# Patient Record
Sex: Male | Born: 1935 | Race: White | Hispanic: No | Marital: Married | Smoking: Never smoker
Health system: Southern US, Community
[De-identification: ages and names within clinical notes are randomized; demographics above are authoritative.]

## PROBLEM LIST (undated history)

## (undated) DIAGNOSIS — D689 Coagulation defect, unspecified: Secondary | ICD-10-CM

## (undated) DIAGNOSIS — K649 Unspecified hemorrhoids: Secondary | ICD-10-CM

## (undated) DIAGNOSIS — Z86718 Personal history of other venous thrombosis and embolism: Secondary | ICD-10-CM

## (undated) DIAGNOSIS — M1990 Unspecified osteoarthritis, unspecified site: Secondary | ICD-10-CM

## (undated) DIAGNOSIS — R011 Cardiac murmur, unspecified: Secondary | ICD-10-CM

## (undated) DIAGNOSIS — T7840XA Allergy, unspecified, initial encounter: Secondary | ICD-10-CM

## (undated) DIAGNOSIS — D6851 Activated protein C resistance: Secondary | ICD-10-CM

## (undated) DIAGNOSIS — E785 Hyperlipidemia, unspecified: Secondary | ICD-10-CM

## (undated) DIAGNOSIS — K635 Polyp of colon: Secondary | ICD-10-CM

## (undated) DIAGNOSIS — K5790 Diverticulosis of intestine, part unspecified, without perforation or abscess without bleeding: Secondary | ICD-10-CM

## (undated) HISTORY — PX: VARICOSE VEIN SURGERY: SHX832

## (undated) HISTORY — DX: Unspecified hemorrhoids: K64.9

## (undated) HISTORY — DX: Polyp of colon: K63.5

## (undated) HISTORY — DX: Cardiac murmur, unspecified: R01.1

## (undated) HISTORY — DX: Unspecified osteoarthritis, unspecified site: M19.90

## (undated) HISTORY — DX: Allergy, unspecified, initial encounter: T78.40XA

## (undated) HISTORY — PX: TONSILLECTOMY: SUR1361

## (undated) HISTORY — DX: Activated protein C resistance: D68.51

## (undated) HISTORY — DX: Diverticulosis of intestine, part unspecified, without perforation or abscess without bleeding: K57.90

## (undated) HISTORY — DX: Coagulation defect, unspecified: D68.9

## (undated) HISTORY — PX: INGUINAL HERNIA REPAIR: SUR1180

## (undated) HISTORY — DX: Personal history of other venous thrombosis and embolism: Z86.718

## (undated) HISTORY — DX: Hyperlipidemia, unspecified: E78.5

## (undated) HISTORY — PX: APPENDECTOMY: SHX54

## (undated) HISTORY — PX: VASECTOMY: SHX75

---

## 2001-11-16 ENCOUNTER — Encounter: Payer: Self-pay | Admitting: Internal Medicine

## 2001-11-27 ENCOUNTER — Encounter (INDEPENDENT_AMBULATORY_CARE_PROVIDER_SITE_OTHER): Payer: Self-pay | Admitting: *Deleted

## 2001-12-30 ENCOUNTER — Ambulatory Visit (HOSPITAL_COMMUNITY): Admission: RE | Admit: 2001-12-30 | Discharge: 2001-12-30 | Payer: Self-pay | Admitting: Ophthalmology

## 2004-07-05 ENCOUNTER — Ambulatory Visit: Payer: Self-pay | Admitting: Family Medicine

## 2004-09-04 ENCOUNTER — Ambulatory Visit: Payer: Self-pay | Admitting: Internal Medicine

## 2004-11-10 ENCOUNTER — Ambulatory Visit: Payer: Self-pay | Admitting: Family Medicine

## 2004-11-13 ENCOUNTER — Ambulatory Visit: Payer: Self-pay | Admitting: Gastroenterology

## 2004-12-05 ENCOUNTER — Ambulatory Visit: Payer: Self-pay | Admitting: Gastroenterology

## 2004-12-05 ENCOUNTER — Encounter (INDEPENDENT_AMBULATORY_CARE_PROVIDER_SITE_OTHER): Payer: Self-pay | Admitting: Specialist

## 2004-12-05 ENCOUNTER — Encounter (INDEPENDENT_AMBULATORY_CARE_PROVIDER_SITE_OTHER): Payer: Self-pay | Admitting: *Deleted

## 2004-12-05 ENCOUNTER — Encounter: Payer: Self-pay | Admitting: Internal Medicine

## 2005-01-12 ENCOUNTER — Ambulatory Visit: Payer: Self-pay | Admitting: Family Medicine

## 2005-09-03 ENCOUNTER — Ambulatory Visit: Payer: Self-pay | Admitting: Family Medicine

## 2005-09-04 ENCOUNTER — Ambulatory Visit: Payer: Self-pay | Admitting: Family Medicine

## 2005-11-27 ENCOUNTER — Ambulatory Visit: Payer: Self-pay | Admitting: Family Medicine

## 2006-09-04 ENCOUNTER — Ambulatory Visit: Payer: Self-pay | Admitting: Family Medicine

## 2006-09-04 DIAGNOSIS — R1310 Dysphagia, unspecified: Secondary | ICD-10-CM

## 2006-09-04 DIAGNOSIS — E785 Hyperlipidemia, unspecified: Secondary | ICD-10-CM | POA: Insufficient documentation

## 2006-09-04 DIAGNOSIS — M1990 Unspecified osteoarthritis, unspecified site: Secondary | ICD-10-CM | POA: Insufficient documentation

## 2006-09-04 HISTORY — DX: Dysphagia, unspecified: R13.10

## 2006-09-06 ENCOUNTER — Encounter (INDEPENDENT_AMBULATORY_CARE_PROVIDER_SITE_OTHER): Payer: Self-pay | Admitting: *Deleted

## 2006-09-12 ENCOUNTER — Encounter: Payer: Self-pay | Admitting: Family Medicine

## 2006-09-12 ENCOUNTER — Ambulatory Visit: Payer: Self-pay

## 2006-10-03 ENCOUNTER — Ambulatory Visit: Payer: Self-pay | Admitting: Cardiology

## 2006-12-11 ENCOUNTER — Encounter: Payer: Self-pay | Admitting: Family Medicine

## 2006-12-20 ENCOUNTER — Ambulatory Visit: Payer: Self-pay | Admitting: Family Medicine

## 2006-12-24 ENCOUNTER — Encounter: Payer: Self-pay | Admitting: Family Medicine

## 2007-02-09 HISTORY — PX: CATARACT EXTRACTION: SUR2

## 2007-04-02 ENCOUNTER — Ambulatory Visit: Payer: Self-pay | Admitting: Family Medicine

## 2007-04-14 LAB — CONVERTED CEMR LAB
Albumin: 3.9 g/dL (ref 3.5–5.2)
Bilirubin, Direct: 0.1 mg/dL (ref 0.0–0.3)
HDL: 30.4 mg/dL — ABNORMAL LOW (ref >39.0)
LDL Cholesterol: 56 mg/dL (ref 0–99)
Total CHOL/HDL Ratio: 4
Triglycerides: 175 mg/dL — ABNORMAL HIGH (ref 0–149)
VLDL: 35 mg/dL (ref 0–40)

## 2007-04-24 ENCOUNTER — Telehealth (INDEPENDENT_AMBULATORY_CARE_PROVIDER_SITE_OTHER): Payer: Self-pay | Admitting: *Deleted

## 2007-08-11 ENCOUNTER — Encounter (INDEPENDENT_AMBULATORY_CARE_PROVIDER_SITE_OTHER): Payer: Self-pay | Admitting: *Deleted

## 2007-08-11 ENCOUNTER — Ambulatory Visit: Payer: Self-pay | Admitting: Family Medicine

## 2007-08-11 DIAGNOSIS — D239 Other benign neoplasm of skin, unspecified: Secondary | ICD-10-CM

## 2007-08-11 HISTORY — DX: Other benign neoplasm of skin, unspecified: D23.9

## 2007-08-21 ENCOUNTER — Encounter: Payer: Self-pay | Admitting: Family Medicine

## 2007-08-21 ENCOUNTER — Encounter (INDEPENDENT_AMBULATORY_CARE_PROVIDER_SITE_OTHER): Payer: Self-pay | Admitting: *Deleted

## 2007-08-25 ENCOUNTER — Ambulatory Visit: Payer: Self-pay | Admitting: Family Medicine

## 2007-08-25 ENCOUNTER — Encounter (INDEPENDENT_AMBULATORY_CARE_PROVIDER_SITE_OTHER): Payer: Self-pay | Admitting: *Deleted

## 2007-08-25 LAB — CONVERTED CEMR LAB: OCCULT 3: NEGATIVE

## 2007-08-28 ENCOUNTER — Ambulatory Visit: Payer: Self-pay | Admitting: Family Medicine

## 2007-08-28 DIAGNOSIS — R413 Other amnesia: Secondary | ICD-10-CM | POA: Insufficient documentation

## 2007-09-12 ENCOUNTER — Ambulatory Visit: Payer: Self-pay | Admitting: Cardiology

## 2007-09-29 ENCOUNTER — Ambulatory Visit (HOSPITAL_COMMUNITY): Admission: RE | Admit: 2007-09-29 | Discharge: 2007-09-29 | Payer: Self-pay | Admitting: Cardiology

## 2007-11-11 ENCOUNTER — Ambulatory Visit: Admission: RE | Admit: 2007-11-11 | Discharge: 2007-11-11 | Payer: Self-pay | Admitting: Family Medicine

## 2007-11-11 ENCOUNTER — Ambulatory Visit: Payer: Self-pay | Admitting: Vascular Surgery

## 2007-11-11 ENCOUNTER — Ambulatory Visit: Payer: Self-pay | Admitting: Family Medicine

## 2007-11-11 ENCOUNTER — Ambulatory Visit: Payer: Self-pay | Admitting: Cardiovascular Disease

## 2007-11-11 ENCOUNTER — Telehealth (INDEPENDENT_AMBULATORY_CARE_PROVIDER_SITE_OTHER): Payer: Self-pay | Admitting: *Deleted

## 2007-11-11 ENCOUNTER — Encounter: Payer: Self-pay | Admitting: Family Medicine

## 2007-11-11 DIAGNOSIS — M79609 Pain in unspecified limb: Secondary | ICD-10-CM | POA: Insufficient documentation

## 2007-11-11 HISTORY — DX: Pain in unspecified limb: M79.609

## 2007-11-13 ENCOUNTER — Ambulatory Visit: Payer: Self-pay | Admitting: Cardiology

## 2007-11-13 LAB — CONVERTED CEMR LAB: Prothrombin Time: 12.8 s (ref 10.9–13.3)

## 2007-11-17 ENCOUNTER — Ambulatory Visit: Payer: Self-pay | Admitting: Cardiology

## 2007-11-17 LAB — CONVERTED CEMR LAB: INR: 2.7 — ABNORMAL HIGH (ref 0.8–1.0)

## 2007-11-19 DIAGNOSIS — K7689 Other specified diseases of liver: Secondary | ICD-10-CM

## 2007-11-19 DIAGNOSIS — I359 Nonrheumatic aortic valve disorder, unspecified: Secondary | ICD-10-CM

## 2007-11-19 HISTORY — DX: Other specified diseases of liver: K76.89

## 2007-11-19 HISTORY — DX: Nonrheumatic aortic valve disorder, unspecified: I35.9

## 2007-11-21 ENCOUNTER — Encounter (INDEPENDENT_AMBULATORY_CARE_PROVIDER_SITE_OTHER): Payer: Self-pay | Admitting: *Deleted

## 2007-11-21 ENCOUNTER — Ambulatory Visit: Payer: Self-pay | Admitting: Cardiology

## 2007-11-25 ENCOUNTER — Ambulatory Visit: Payer: Self-pay | Admitting: Internal Medicine

## 2007-11-28 ENCOUNTER — Ambulatory Visit: Payer: Self-pay | Admitting: Internal Medicine

## 2007-12-05 ENCOUNTER — Ambulatory Visit: Payer: Self-pay | Admitting: Cardiology

## 2007-12-19 ENCOUNTER — Ambulatory Visit: Payer: Self-pay | Admitting: Cardiovascular Disease

## 2008-01-06 ENCOUNTER — Ambulatory Visit: Payer: Self-pay | Admitting: Cardiology

## 2008-01-27 ENCOUNTER — Ambulatory Visit: Payer: Self-pay | Admitting: Cardiovascular Disease

## 2008-02-24 ENCOUNTER — Ambulatory Visit: Payer: Self-pay | Admitting: Cardiology

## 2008-03-05 ENCOUNTER — Ambulatory Visit: Payer: Self-pay | Admitting: Family Medicine

## 2008-03-08 ENCOUNTER — Encounter (INDEPENDENT_AMBULATORY_CARE_PROVIDER_SITE_OTHER): Payer: Self-pay | Admitting: *Deleted

## 2008-03-09 ENCOUNTER — Encounter: Payer: Self-pay | Admitting: Family Medicine

## 2008-03-23 ENCOUNTER — Ambulatory Visit: Payer: Self-pay | Admitting: Cardiology

## 2008-03-23 ENCOUNTER — Encounter (INDEPENDENT_AMBULATORY_CARE_PROVIDER_SITE_OTHER): Payer: Self-pay | Admitting: *Deleted

## 2008-04-06 ENCOUNTER — Telehealth: Payer: Self-pay | Admitting: Family Medicine

## 2008-04-20 ENCOUNTER — Telehealth (INDEPENDENT_AMBULATORY_CARE_PROVIDER_SITE_OTHER): Payer: Self-pay | Admitting: *Deleted

## 2008-04-20 ENCOUNTER — Ambulatory Visit: Payer: Self-pay | Admitting: Cardiology

## 2008-04-21 ENCOUNTER — Telehealth (INDEPENDENT_AMBULATORY_CARE_PROVIDER_SITE_OTHER): Payer: Self-pay | Admitting: *Deleted

## 2008-04-22 ENCOUNTER — Telehealth (INDEPENDENT_AMBULATORY_CARE_PROVIDER_SITE_OTHER): Payer: Self-pay | Admitting: *Deleted

## 2008-06-14 ENCOUNTER — Telehealth: Payer: Self-pay | Admitting: Cardiology

## 2008-07-06 ENCOUNTER — Encounter: Payer: Self-pay | Admitting: *Deleted

## 2008-08-11 ENCOUNTER — Encounter: Payer: Self-pay | Admitting: *Deleted

## 2008-08-26 ENCOUNTER — Encounter (INDEPENDENT_AMBULATORY_CARE_PROVIDER_SITE_OTHER): Payer: Self-pay | Admitting: *Deleted

## 2008-08-31 ENCOUNTER — Telehealth: Payer: Self-pay | Admitting: Internal Medicine

## 2008-09-03 ENCOUNTER — Ambulatory Visit: Payer: Self-pay | Admitting: Family Medicine

## 2008-09-03 DIAGNOSIS — I82409 Acute embolism and thrombosis of unspecified deep veins of unspecified lower extremity: Secondary | ICD-10-CM | POA: Insufficient documentation

## 2008-09-03 HISTORY — DX: Acute embolism and thrombosis of unspecified deep veins of unspecified lower extremity: I82.409

## 2008-09-07 ENCOUNTER — Telehealth (INDEPENDENT_AMBULATORY_CARE_PROVIDER_SITE_OTHER): Payer: Self-pay | Admitting: *Deleted

## 2008-09-08 ENCOUNTER — Ambulatory Visit: Payer: Self-pay | Admitting: Family Medicine

## 2008-09-09 ENCOUNTER — Ambulatory Visit (HOSPITAL_COMMUNITY): Admission: RE | Admit: 2008-09-09 | Discharge: 2008-09-09 | Payer: Self-pay | Admitting: Internal Medicine

## 2008-09-10 ENCOUNTER — Encounter (INDEPENDENT_AMBULATORY_CARE_PROVIDER_SITE_OTHER): Payer: Self-pay | Admitting: *Deleted

## 2008-09-14 ENCOUNTER — Ambulatory Visit: Payer: Self-pay | Admitting: Family Medicine

## 2008-09-16 LAB — CONVERTED CEMR LAB
Basophils Absolute: 0 10*3/uL (ref 0.0–0.1)
HCT: 47.5 % (ref 39.0–52.0)
Lymphocytes Relative: 42.6 % (ref 12.0–46.0)
Lymphs Abs: 1.7 10*3/uL (ref 0.7–4.0)
Monocytes Relative: 10.1 % (ref 3.0–12.0)
Neutrophils Relative %: 42.9 % — ABNORMAL LOW (ref 43.0–77.0)
Platelets: 139 10*3/uL — ABNORMAL LOW (ref 150.0–400.0)
RDW: 12 % (ref 11.5–14.6)

## 2008-09-23 ENCOUNTER — Ambulatory Visit: Payer: Self-pay

## 2008-09-23 ENCOUNTER — Encounter: Payer: Self-pay | Admitting: Family Medicine

## 2008-10-01 ENCOUNTER — Encounter (INDEPENDENT_AMBULATORY_CARE_PROVIDER_SITE_OTHER): Payer: Self-pay | Admitting: *Deleted

## 2008-10-05 ENCOUNTER — Ambulatory Visit: Payer: Self-pay | Admitting: Cardiology

## 2008-11-17 ENCOUNTER — Ambulatory Visit: Payer: Self-pay | Admitting: Family Medicine

## 2008-11-29 ENCOUNTER — Telehealth (INDEPENDENT_AMBULATORY_CARE_PROVIDER_SITE_OTHER): Payer: Self-pay | Admitting: *Deleted

## 2008-12-01 ENCOUNTER — Ambulatory Visit: Payer: Self-pay | Admitting: Family Medicine

## 2008-12-01 DIAGNOSIS — I8310 Varicose veins of unspecified lower extremity with inflammation: Secondary | ICD-10-CM | POA: Insufficient documentation

## 2008-12-01 HISTORY — DX: Varicose veins of unspecified lower extremity with inflammation: I83.10

## 2009-07-29 ENCOUNTER — Ambulatory Visit: Payer: Self-pay | Admitting: Family Medicine

## 2009-07-29 DIAGNOSIS — H6120 Impacted cerumen, unspecified ear: Secondary | ICD-10-CM | POA: Insufficient documentation

## 2009-07-29 DIAGNOSIS — H6690 Otitis media, unspecified, unspecified ear: Secondary | ICD-10-CM | POA: Insufficient documentation

## 2009-07-29 HISTORY — DX: Impacted cerumen, unspecified ear: H61.20

## 2009-08-19 ENCOUNTER — Ambulatory Visit: Payer: Self-pay | Admitting: Family Medicine

## 2009-09-06 ENCOUNTER — Ambulatory Visit: Payer: Self-pay | Admitting: Family Medicine

## 2009-09-07 ENCOUNTER — Encounter: Payer: Self-pay | Admitting: Family Medicine

## 2009-09-13 ENCOUNTER — Ambulatory Visit: Payer: Self-pay | Admitting: Family Medicine

## 2009-09-14 LAB — CONVERTED CEMR LAB: Fecal Occult Bld: NEGATIVE

## 2009-10-26 ENCOUNTER — Encounter (INDEPENDENT_AMBULATORY_CARE_PROVIDER_SITE_OTHER): Payer: Self-pay | Admitting: *Deleted

## 2009-11-09 ENCOUNTER — Ambulatory Visit: Payer: Self-pay | Admitting: Family Medicine

## 2009-11-16 ENCOUNTER — Encounter (INDEPENDENT_AMBULATORY_CARE_PROVIDER_SITE_OTHER): Payer: Self-pay | Admitting: *Deleted

## 2009-11-20 ENCOUNTER — Inpatient Hospital Stay (HOSPITAL_COMMUNITY): Admission: EM | Admit: 2009-11-20 | Discharge: 2009-11-22 | Payer: Self-pay | Admitting: Emergency Medicine

## 2009-11-20 ENCOUNTER — Encounter: Payer: Self-pay | Admitting: Family Medicine

## 2009-11-20 ENCOUNTER — Encounter (INDEPENDENT_AMBULATORY_CARE_PROVIDER_SITE_OTHER): Payer: Self-pay | Admitting: Internal Medicine

## 2009-11-20 ENCOUNTER — Ambulatory Visit: Payer: Self-pay | Admitting: Vascular Surgery

## 2009-11-20 DIAGNOSIS — I2699 Other pulmonary embolism without acute cor pulmonale: Secondary | ICD-10-CM

## 2009-11-20 HISTORY — DX: Other pulmonary embolism without acute cor pulmonale: I26.99

## 2009-11-21 ENCOUNTER — Encounter: Payer: Self-pay | Admitting: Family Medicine

## 2009-11-22 ENCOUNTER — Encounter: Payer: Self-pay | Admitting: Family Medicine

## 2009-11-23 ENCOUNTER — Encounter: Payer: Self-pay | Admitting: Family Medicine

## 2009-11-23 ENCOUNTER — Encounter: Payer: Self-pay | Admitting: Internal Medicine

## 2009-11-23 ENCOUNTER — Ambulatory Visit: Payer: Self-pay | Admitting: Internal Medicine

## 2009-11-23 LAB — CONVERTED CEMR LAB: POC INR: 1.7

## 2009-11-25 ENCOUNTER — Ambulatory Visit: Payer: Self-pay | Admitting: Family Medicine

## 2009-11-25 ENCOUNTER — Telehealth: Payer: Self-pay | Admitting: Family Medicine

## 2009-11-25 LAB — CONVERTED CEMR LAB: INR: 3

## 2009-12-01 ENCOUNTER — Ambulatory Visit: Payer: Self-pay | Admitting: Family Medicine

## 2009-12-01 DIAGNOSIS — Z86718 Personal history of other venous thrombosis and embolism: Secondary | ICD-10-CM | POA: Insufficient documentation

## 2009-12-01 DIAGNOSIS — D696 Thrombocytopenia, unspecified: Secondary | ICD-10-CM

## 2009-12-01 DIAGNOSIS — D682 Hereditary deficiency of other clotting factors: Secondary | ICD-10-CM | POA: Insufficient documentation

## 2009-12-01 HISTORY — DX: Hereditary deficiency of other clotting factors: D68.2

## 2009-12-01 HISTORY — DX: Thrombocytopenia, unspecified: D69.6

## 2009-12-01 LAB — CONVERTED CEMR LAB: INR: 5.4

## 2009-12-02 LAB — CONVERTED CEMR LAB
Basophils Relative: 0.5 % (ref 0.0–3.0)
Eosinophils Relative: 3 % (ref 0.0–5.0)
Monocytes Absolute: 0.4 10*3/uL (ref 0.1–1.0)
Neutro Abs: 2.2 10*3/uL (ref 1.4–7.7)
Neutrophils Relative %: 47.1 % (ref 43.0–77.0)
Platelets: 196 10*3/uL (ref 150.0–400.0)
RDW: 12.8 % (ref 11.5–14.6)

## 2009-12-08 ENCOUNTER — Ambulatory Visit: Payer: Self-pay | Admitting: Family Medicine

## 2009-12-08 LAB — CONVERTED CEMR LAB: Prothrombin Time: 67.5 s (ref 9.7–11.8)

## 2009-12-12 ENCOUNTER — Ambulatory Visit: Payer: Self-pay | Admitting: Family Medicine

## 2009-12-19 ENCOUNTER — Ambulatory Visit: Payer: Self-pay | Admitting: Family Medicine

## 2009-12-19 LAB — CONVERTED CEMR LAB: INR: 2.2

## 2010-01-02 ENCOUNTER — Ambulatory Visit: Payer: Self-pay | Admitting: Family Medicine

## 2010-01-02 LAB — CONVERTED CEMR LAB: INR: 2.5

## 2010-02-01 ENCOUNTER — Ambulatory Visit: Payer: Self-pay | Admitting: Family Medicine

## 2010-03-01 ENCOUNTER — Encounter: Payer: Self-pay | Admitting: Family Medicine

## 2010-03-01 ENCOUNTER — Ambulatory Visit
Admission: RE | Admit: 2010-03-01 | Discharge: 2010-03-01 | Payer: Self-pay | Source: Home / Self Care | Attending: Family Medicine | Admitting: Family Medicine

## 2010-03-01 LAB — CONVERTED CEMR LAB: INR: 1.73 — ABNORMAL HIGH (ref <1.50)

## 2010-03-05 LAB — CONVERTED CEMR LAB
ALT: 17 units/L (ref 0–53)
ALT: 21 units/L (ref 0–53)
ALT: 22 units/L (ref 0–53)
ALT: 22 units/L (ref 0–53)
ALT: 28 units/L (ref 0–53)
AST: 25 units/L (ref 0–37)
AST: 27 units/L (ref 0–37)
AST: 29 units/L (ref 0–37)
Albumin: 3.8 g/dL (ref 3.5–5.2)
Albumin: 4.1 g/dL (ref 3.5–5.2)
Albumin: 4.1 g/dL (ref 3.5–5.2)
Albumin: 4.2 g/dL (ref 3.5–5.2)
Albumin: 4.2 g/dL (ref 3.5–5.2)
Albumin: 4.5 g/dL (ref 3.5–5.2)
Alkaline Phosphatase: 51 units/L (ref 39–117)
Alkaline Phosphatase: 52 units/L (ref 39–117)
Alkaline Phosphatase: 55 units/L (ref 39–117)
Alkaline Phosphatase: 59 units/L (ref 39–117)
Anticardiolipin IgA: <10 (ref <12)
Anticardiolipin IgM: <10 (ref <13)
BUN: 12 mg/dL (ref 6–23)
BUN: 14 mg/dL (ref 6–23)
BUN: 16 mg/dL (ref 6–23)
Basophils Absolute: 0 10*3/uL (ref 0.0–0.1)
Basophils Absolute: 0 10*3/uL (ref 0.0–0.1)
Basophils Absolute: 0 10*3/uL (ref 0.0–0.1)
Basophils Relative: 0.3 % (ref 0.0–1.0)
Bilirubin Urine: NEGATIVE
Blood in Urine, dipstick: NEGATIVE
CO2: 34 meq/L — ABNORMAL HIGH (ref 19–32)
CRP, High Sensitivity: <1 — ABNORMAL LOW (ref 0.00–5.00)
Calcium: 9.7 mg/dL (ref 8.4–10.5)
Chloride: 104 meq/L (ref 96–112)
Cholesterol: 116 mg/dL (ref 0–200)
Cholesterol: 126 mg/dL (ref 0–200)
Creatinine, Ser: 1 mg/dL (ref 0.4–1.5)
Creatinine, Ser: 1.1 mg/dL (ref 0.4–1.5)
Eosinophils Absolute: 0.1 10*3/uL (ref 0.0–0.6)
Eosinophils Absolute: 0.1 10*3/uL (ref 0.0–0.7)
Eosinophils Absolute: 0.1 10*3/uL (ref 0.0–0.7)
Eosinophils Relative: 2.3 % (ref 0.0–5.0)
Eosinophils Relative: 3.2 % (ref 0.0–5.0)
GFR calc Af Amer: 85 mL/min
GFR calc Af Amer: 95 mL/min
GFR calc non Af Amer: 63.32 mL/min (ref >60)
GFR calc non Af Amer: 71 mL/min
GFR calc non Af Amer: 74.48 mL/min (ref >60)
Glucose, Bld: 101 mg/dL — ABNORMAL HIGH (ref 70–99)
Glucose, Bld: 107 mg/dL — ABNORMAL HIGH (ref 70–99)
Glucose, Bld: 98 mg/dL (ref 70–99)
Glucose, Urine, Semiquant: NEGATIVE
HCT: 49.2 % (ref 39.0–52.0)
HCT: 49.6 % (ref 39.0–52.0)
HCT: 51.3 % (ref 39.0–52.0)
HDL: 30.1 mg/dL — ABNORMAL LOW (ref >39.0)
Hemoglobin: 16.6 g/dL (ref 13.0–17.0)
Hemoglobin: 17.5 g/dL — ABNORMAL HIGH (ref 13.0–17.0)
Lymphs Abs: 1.4 10*3/uL (ref 0.7–4.0)
Lymphs Abs: 1.5 10*3/uL (ref 0.7–4.0)
MCHC: 34.8 g/dL (ref 30.0–36.0)
MCHC: 35.7 g/dL (ref 30.0–36.0)
MCV: 100.5 fL — ABNORMAL HIGH (ref 78.0–100.0)
MCV: 101.4 fL — ABNORMAL HIGH (ref 78.0–100.0)
MCV: 99 fL (ref 78.0–100.0)
Monocytes Absolute: 0.4 10*3/uL (ref 0.1–1.0)
Monocytes Absolute: 0.4 10*3/uL (ref 0.1–1.0)
Monocytes Absolute: 0.4 10*3/uL (ref 0.1–1.0)
Monocytes Absolute: 0.4 10*3/uL (ref 0.2–0.7)
Monocytes Relative: 10.2 % (ref 3.0–11.0)
Monocytes Relative: 10.3 % (ref 3.0–12.0)
Monocytes Relative: 9.9 % (ref 3.0–12.0)
Neutro Abs: 2 10*3/uL (ref 1.4–7.7)
Neutrophils Relative %: 53.2 % (ref 43.0–77.0)
PSA: 1.28 ng/mL (ref 0.10–4.00)
PSA: 1.65 ng/mL (ref 0.10–4.00)
PSA: 1.77 ng/mL (ref 0.10–4.00)
Platelets: 134 10*3/uL — ABNORMAL LOW (ref 150.0–400.0)
Platelets: 150 10*3/uL (ref 150–400)
Potassium: 3.9 meq/L (ref 3.5–5.1)
Potassium: 4.6 meq/L (ref 3.5–5.1)
Potassium: 4.9 meq/L (ref 3.5–5.1)
Protein C Activity: 95 % (ref 75–133)
Protein S Activity: 105 % (ref 69–129)
Protein S Ag, Total: 100 % (ref 70–140)
Protein, U semiquant: NEGATIVE
RBC: 5.01 M/uL (ref 4.22–5.81)
RDW: 11.7 % (ref 11.5–14.6)
RDW: 12 % (ref 11.5–14.6)
RDW: 12.9 % (ref 11.5–14.6)
Sodium: 142 meq/L (ref 135–145)
Sodium: 142 meq/L (ref 135–145)
Specific Gravity, Urine: 1.015
TSH: 1.66 microintl units/mL (ref 0.35–5.50)
TSH: 1.82 microintl units/mL (ref 0.35–5.50)
Total Bilirubin: 1.8 mg/dL — ABNORMAL HIGH (ref 0.3–1.2)
Total Bilirubin: 1.9 mg/dL — ABNORMAL HIGH (ref 0.3–1.2)
Total Protein: 7.3 g/dL (ref 6.0–8.3)
Total Protein: 7.5 g/dL (ref 6.0–8.3)
VLDL: 30.2 mg/dL (ref 0.0–40.0)
VLDL: 30.4 mg/dL (ref 0.0–40.0)
VLDL: 39 mg/dL (ref 0–40)
Varicella IgG: 3.2 — ABNORMAL HIGH
WBC: 3.9 10*3/uL — ABNORMAL LOW (ref 4.5–10.5)
WBC: 4.1 10*3/uL — ABNORMAL LOW (ref 4.5–10.5)
pH: 5

## 2010-03-07 NOTE — Assessment & Plan Note (Signed)
Summary: PT/inr//fd  Nurse Visit   Vital Signs:  Patient profile:   76 year old male Height:      67 inches Weight:      185 pounds BMI:     29.08 Temp:     97.7 degrees F oral Pulse rate:   76 / minute BP sitting:   130 / 66  (left arm)  Vitals Entered By: Jeremy Johann CMA (January 02, 2010 9:17 AM) CC: PT/INR CHECK  7 Current Medications (verified): 1)  Red Yeast Rice 600 Mg  Caps (Red Yeast Rice Extract) .Marland Kitchen.. 1 Once Daily 2)  Coq10 100 Mg  Caps (Coenzyme Q10) .Marland Kitchen.. 1 Once Daily 3)  Saw Palmetto 160 Mg  Caps (Saw Palmetto (Serenoa Repens)) .Marland Kitchen.. 1 Once Daily 4)  Fish Oil .... Once Daily 5)  Multivitamins   Tabs (Multiple Vitamin) .Marland Kitchen.. 1 By Mouth Once Daily 6)  Vitamin D3 1000 Unit Tabs (Cholecalciferol) .... Once Daily 7)  Super B-100  Tabs (B Complex Vitamins) .... Once Daily 8)  Wal-Mucil 100 % Powd (Psyllium) .... Take One Tablespoon Daily 9)  Folic Acid 800 Mcg Tabs (Folic Acid) .Marland Kitchen.. 1 By Mouth Qd 10)  Hylauromic 50mg  .... 1 By Mouth Qod 11)  Warfarin Sodium 5 Mg Tabs (Warfarin Sodium) .... 2 By Mouth Once Daily  Allergies (verified): 1)  ! Niacin Laboratory Results   Blood Tests      INR: 2.5   (Normal Range: 0.88-1.12   Therap INR: 2.0-3.5)    Orders Added: 1)  Est. Patient Level I [60109] 2)  Protime [32355DD]    ANTICOAGULATION RECORD PREVIOUS REGIMEN & LAB RESULTS Anticoagulation Diagnosis:  Deep venous thrombosis on  12/12/2009 Previous INR Goal Range:  2 - 3 on  06/08/2008 Previous INR:  2.2 on  12/19/2009 Previous Coumadin Dose(mg):  (5mg ) tab once daily on  12/19/2009 Previous Regimen:  same on  12/19/2009  NEW REGIMEN & LAB RESULTS Current INR: 2.5 Current Coumadin Dose(mg): (5mg ) tab once daily Regimen: SAME  Tykira Wachs: LOWNE Repeat testing in: 4 WEEKS  Anticoagulation Visit Questionnaire Coumadin dose missed/changed:  No Abnormal Bleeding Symptoms:  No  Any diet changes including alcohol intake, vegetables or greens since the  last visit:  No Any illnesses or hospitalizations since the last visit:  No Any signs of clotting since the last visit (including chest discomfort, dizziness, shortness of breath, arm tingling, slurred speech, swelling or redness in leg):  No  MEDICATIONS RED YEAST RICE 600 MG  CAPS (RED YEAST RICE EXTRACT) 1 once daily COQ10 100 MG  CAPS (COENZYME Q10) 1 once daily SAW PALMETTO 160 MG  CAPS (SAW PALMETTO (SERENOA REPENS)) 1 once daily * FISH OIL once daily MULTIVITAMINS   TABS (MULTIPLE VITAMIN) 1 by mouth once daily VITAMIN D3 1000 UNIT TABS (CHOLECALCIFEROL) once daily SUPER B-100  TABS (B COMPLEX VITAMINS) once daily WAL-MUCIL 100 % POWD (PSYLLIUM) Take one tablespoon daily FOLIC ACID 800 MCG TABS (FOLIC ACID) 1 by mouth qd * HYLAUROMIC 50MG  1 by mouth qod WARFARIN SODIUM 5 MG TABS (WARFARIN SODIUM) 2 by mouth once daily

## 2010-03-07 NOTE — Assessment & Plan Note (Signed)
Summary: RIGHT EAR PROBLEMS//KN   Vital Signs:  Patient profile:   76 year old male Weight:      182.50 pounds BMI:     28.69 Pulse rate:   92 / minute Pulse rhythm:   regular BP sitting:   126 / 80  (left arm) Cuff size:   large  Vitals Entered By: Army Fossa CMA (July 29, 2009 11:15 AM) CC: Pt states he is having problems with his hearing in his right ear.   History of Present Illness: Pt here c/o R ear feeling stopped up.  He has been using H202 for several days.  No other complaints.    Current Medications (verified): 1)  Adult Aspirin Low Strength 81 Mg  Tbdp (Aspirin) .Marland Kitchen.. 1 Once Daily 2)  Red Yeast Rice 600 Mg  Caps (Red Yeast Rice Extract) .Marland Kitchen.. 1 Once Daily 3)  Coq10 100 Mg  Caps (Coenzyme Q10) .Marland Kitchen.. 1 Once Daily 4)  Saw Palmetto 160 Mg  Caps (Saw Palmetto (Serenoa Repens)) .Marland Kitchen.. 1 Once Daily 5)  Liquid Flax Seed Oil .Marland Kitchen.. 4 Tsp Once Daily 6)  Calcium Plus D 600mg  .... 2 Once Daily 7)  Multivitamins   Tabs (Multiple Vitamin) .Marland Kitchen.. 1 By Mouth Once Daily 8)  Astragalus Root   Powd (Tragacanth) 550 Mg .Marland Kitchen.. 1 By Mouth Once Daily 9)  Vitamin D3 1000 Unit Tabs (Cholecalciferol) .... Once Daily 10)  Super B-100  Tabs (B Complex Vitamins) .... Once Daily 11)  Wal-Mucil 100 % Powd (Psyllium) .... Take One Tablespoon Daily 12)  Ceftin 500 Mg Tabs (Cefuroxime Axetil) .Marland Kitchen.. 1 By Mouth Two Times A Day  Allergies: 1)  ! Niacin  Past History:  Past medical, surgical, family and social histories (including risk factors) reviewed for relevance to current acute and chronic problems.  Past Medical History: Reviewed history from 10/05/2008 and no changes required. Osteoarthritis Hyperlipidemia Current Problems:  DYSPHAGIA, UNSPECIFIED (ICD-787.20) HYPERLIPIDEMIA (ICD-272.4) CARDIAC MURMUR (ICD-785.2) (mild aortic sclerosis) OSTEOARTHRITIS (ICD-715.90) Blood clots  Past Surgical History: Reviewed history from 10/01/2008 and no changes required. Appendectomy Inguinal  herniorrhaphy Tonsillectomy Vasectomy Vein stripping r leg Cataract extraction (02/19/2007) R eye  Family History: Reviewed history from 10/01/2008 and no changes required. Family History of Arthritis Family History Hypertension Family History Lung cancer  M- MI at 69 Family History of Heart Disease: Mother  Social History: Reviewed history from 10/01/2008 and no changes required. 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   Neck:  No deformities, masses, or tenderness noted. Psych:  Oriented X3 and normally interactive.     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 1 once daily    Ceftin 500 Mg Tabs (Cefuroxime axetil) .Marland Kitchen... 1 by mouth two times a day  Instructed on prevention and treatment. Call if no improvement in 48-72 hours or sooner if worsening symptoms.  recheck 2-3 weeks  Complete Medication List: 1)  Adult Aspirin Low Strength 81 Mg Tbdp (Aspirin) .Marland Kitchen.. 1 once daily 2)  Red Yeast Rice 600 Mg Caps (Red  yeast rice extract) .Marland Kitchen.. 1 once daily 3)  Coq10 100 Mg Caps (Coenzyme q10) .Marland Kitchen.. 1 once daily 4)  Saw Palmetto 160 Mg Caps (Saw palmetto (serenoa repens)) .Marland Kitchen.. 1 once daily 5)  Liquid Flax Seed Oil  .Marland Kitchen.. 4 tsp once daily 6)  Calcium Plus D 600mg   .... 2 once daily 7)  Multivitamins Tabs (Multiple vitamin) .Marland Kitchen.. 1 by mouth once daily 8)  Astragalus Root Powd (tragacanth) 550 Mg  .Marland KitchenMarland Kitchen. 1 by mouth once daily 9)  Vitamin D3 1000 Unit  Tabs (Cholecalciferol) .... Once daily 10)  Super B-100 Tabs (B complex vitamins) .... Once daily 11)  Wal-mucil 100 % Powd (Psyllium) .... Take one tablespoon daily 12)  Ceftin 500 Mg Tabs (Cefuroxime axetil) .Marland Kitchen.. 1 by mouth two times a day  Patient Instructions: 1)  Please schedule a follow-up appointment in 2 weeks-- recheck ear Prescriptions: CEFTIN 500 MG TABS (CEFUROXIME AXETIL) 1 by mouth two times a day  #20 x 0   Entered and Authorized by:   Loreen Freud DO   Signed by:   Loreen Freud DO on 07/29/2009   Method used:   Electronically to        [facility] [address] (retail)       [phone]       [place], [place]  [address]       Ph: [phone]       Fax: [phone]   RxID:   [id number]

## 2010-03-07 NOTE — Letter (Signed)
Summary: Encounter Notice/[facility]  Encounter Notice/[facility]   Imported By: Lanelle Bal 12/05/2009 09:02:42  _____________________________________________________________________  External Attachment:    Type:   Image     Comment:   External Document

## 2010-03-07 NOTE — Medication Information (Signed)
Summary: [facility]in Clinic  Anticoagulant Therapy  Managed by: Inactive Referring MD: Laury Axon PCP: Dr. Laury Axon Supervising MD: Graciela Husbands MD, Viviann Spare Indication 1: DVT/PE    Indication 2: Factor V Leiden Lab Used: [facility] INR RANGE 2 - 3          Comments: Pt f/u with Dr. Laury Axon  Allergies: 1)  ! Niacin  Anticoagulation Management History:      Positive risk factors for bleeding include an age of 44 years or older.  The bleeding index is 'intermediate risk'.  Negative CHADS2 values include Age > 67 years old.  The start date was 11/11/2007.  His last INR was 2.7 RATIO.  Anticoagulation responsible provider: Graciela Husbands MD, Viviann Spare.    Anticoagulation Management Assessment/Plan:      The target INR is 2 - 3.  The next INR is due 11/25/2009.  Anticoagulation instructions were given to patient.  Results were reviewed/authorized by Inactive.         Prior Anticoagulation Instructions: INR 1.7  Continue 2 tablets today and tomorrow.  Continue Lovenox injections. Recheck INR in 2 days with Dr. Laury Axon

## 2010-03-07 NOTE — Progress Notes (Signed)
----   Converted from flag ---- ---- 11/23/2009 12:16 PM, [name] PharmD wrote: Lorain Childes- Pt has Coumadin appt with you on 10/21.  Restarted Coumadin and Lovenox on 10/16 for new PE.  Discharged on 10mg  daily.  INR on 10/19 was 1.7.  Continued on 10mg  daily and Lovenox and recheck on 10/21.  He will likely be supratherapeutic but given clot was just this week, wanted to make sure he didn't stay subtherapeutic for long.  We followed him years ago and he was therapeutic on 5mg  daily.  Will anticipate will need closer to that dose rather than 10mg  daily.  Please let me know if there is anything we can do to help.  Thanks ------------------------------

## 2010-03-07 NOTE — Assessment & Plan Note (Signed)
Summary: COUMIDIN CHECK///[facility]  Nurse Visit   Vital Signs:  Patient profile:   76 year old male Height:      67 inches Weight:      185 pounds Temp:     97.7 degrees F oral Pulse rate:   76 / minute BP sitting:   122 / 76  (left arm)  Vitals Entered By: Jeremy Johann CMA (December 19, 2009 2:12 PM) CC: pt check   Current Medications (verified): 1)  Red Yeast Rice 600 Mg  Caps (Red Yeast Rice Extract) .Marland Kitchen.. 1 Once Daily 2)  Coq10 100 Mg  Caps (Coenzyme Q10) .Marland Kitchen.. 1 Once Daily 3)  Saw Palmetto 160 Mg  Caps (Saw Palmetto (Serenoa Repens)) .Marland Kitchen.. 1 Once Daily 4)  Fish Oil .... Once Daily 5)  Multivitamins   Tabs (Multiple Vitamin) .Marland Kitchen.. 1 By Mouth Once Daily 6)  Vitamin D3 1000 Unit Tabs (Cholecalciferol) .... Once Daily 7)  Super B-100  Tabs (B Complex Vitamins) .... Once Daily 8)  Wal-Mucil 100 % Powd (Psyllium) .... Take One Tablespoon Daily 9)  Folic Acid 800 Mcg Tabs (Folic Acid) .Marland Kitchen.. 1 By Mouth Qd 10)  Hylauromic 50mg  .... 1 By Mouth Qod 11)  Warfarin Sodium 5 Mg Tabs (Warfarin Sodium) .... 2 By Mouth Once Daily  Allergies (verified): 1)  ! Niacin Laboratory Results   Blood Tests      INR: 2.2   (Normal Range: 0.88-1.12   Therap INR: 2.0-3.5)    Orders Added: 1)  Est. Patient Level I [16109] 2)  Protime [[id number]]    ANTICOAGULATION RECORD PREVIOUS REGIMEN & LAB RESULTS Anticoagulation Diagnosis:  Deep venous thrombosis on  12/12/2009 Previous INR Goal Range:  2 - 3 on  06/08/2008 Previous INR:  2.0 on  12/12/2009 Previous Coumadin Dose(mg):  5mg  po qd on  12/12/2009 Previous Regimen:  Hold warfarin today and tomorrow. Then take 5mg  every day and recheck on Monday  on  12/08/2009  NEW REGIMEN & LAB RESULTS Current INR: 2.2 Current Coumadin Dose(mg): (5mg ) tab once daily Regimen: same  Provider: lowne Repeat testing in: 2 weeks Other Comments: Pt aware appt schedule.Felecia Deloach CMA  December 19, 2009 3:15 PM    Anticoagulation Visit  Questionnaire Coumadin dose missed/changed:  No Abnormal Bleeding Symptoms:  No  Any diet changes including alcohol intake, vegetables or greens since the last visit:  No Any illnesses or hospitalizations since the last visit:  No Any signs of clotting since the last visit (including chest discomfort, dizziness, shortness of breath, arm tingling, slurred speech, swelling or redness in leg):  No  MEDICATIONS RED YEAST RICE 600 MG  CAPS (RED YEAST RICE EXTRACT) 1 once daily COQ10 100 MG  CAPS (COENZYME Q10) 1 once daily SAW PALMETTO 160 MG  CAPS (SAW PALMETTO (SERENOA REPENS)) 1 once daily * FISH OIL once daily MULTIVITAMINS   TABS (MULTIPLE VITAMIN) 1 by mouth once daily VITAMIN D3 1000 UNIT TABS (CHOLECALCIFEROL) once daily SUPER B-100  TABS (B COMPLEX VITAMINS) once daily WAL-MUCIL 100 % POWD (PSYLLIUM) Take one tablespoon daily FOLIC ACID 800 MCG TABS (FOLIC ACID) 1 by mouth qd * HYLAUROMIC 50MG  1 by mouth qod WARFARIN SODIUM 5 MG TABS (WARFARIN SODIUM) 2 by mouth once daily

## 2010-03-07 NOTE — Assessment & Plan Note (Signed)
Summary: flu shot/cbs  Nurse Visit   Allergies: 1)  ! Niacin  Orders Added: 1)  Admin 1st Vaccine [90471] 2)  Flu Vaccine 16yrs + [69629] Flu Vaccine Consent Questions     Do you have a history of severe allergic reactions to this vaccine? no    Any prior history of allergic reactions to egg and/or gelatin? no    Do you have a sensitivity to the preservative Thimersol? no    Do you have a past history of Guillan-Barre Syndrome? no    Do you currently have an acute febrile illness? no    Have you ever had a severe reaction to latex? no    Vaccine information given and explained to patient? yes    Are you currently pregnant? no    Lot Number:[id number]   Exp Date:08/05/2010   Site Given  Left Deltoid IM .lbflu

## 2010-03-07 NOTE — Assessment & Plan Note (Signed)
Summary: PT/INR CHECK..FD  Nurse Visit   Vital Signs:  Patient profile:   76 year old male Height:      67 inches Weight:      182 pounds Pulse rate:   66 / minute BP sitting:   118 / 78  (left arm)  Vitals Entered By: Jeremy Johann CMA (November 25, 2009 10:47 AM) CC: pt/inr   Current Medications (verified): 1)  Aspirin 325 Mg Tabs (Aspirin) .Marland Kitchen.. 1 By Mouth Qd 2)  Red Yeast Rice 600 Mg  Caps (Red Yeast Rice Extract) .Marland Kitchen.. 1 Once Daily 3)  Coq10 100 Mg  Caps (Coenzyme Q10) .Marland Kitchen.. 1 Once Daily 4)  Saw Palmetto 160 Mg  Caps (Saw Palmetto (Serenoa Repens)) .Marland Kitchen.. 1 Once Daily 5)  Fish Oil .... Once Daily 6)  Multivitamins   Tabs (Multiple Vitamin) .Marland Kitchen.. 1 By Mouth Once Daily 7)  Vitamin D3 1000 Unit Tabs (Cholecalciferol) .... Once Daily 8)  Super B-100  Tabs (B Complex Vitamins) .... Once Daily 9)  Wal-Mucil 100 % Powd (Psyllium) .... Take One Tablespoon Daily 10)  Folic Acid 800 Mcg Tabs (Folic Acid) .Marland Kitchen.. 1 By Mouth Qd 11)  Hylauromic 50mg  .... 1 By Mouth Qod 12)  Warfarin Sodium 5 Mg Tabs (Warfarin Sodium) .... As Directed  Allergies (verified): 1)  ! Niacin Laboratory Results   Blood Tests    Date/Time Reported: November 25, 2009 10:48 AM   INR: 3.0   (Normal Range: 0.88-1.12   Therap INR: 2.0-3.5)    Orders Added: 1)  Est. Patient Level I [40981] 2)  Protime [[id number]] Prescriptions: LOVENOX 80 MG/0.8ML SOLN (ENOXAPARIN SODIUM) subcutaneously two times a day for 2 day  #1 vial x 0   Entered by:   Jeremy Johann CMA   Authorized by:   Loreen Freud DO   Signed by:   Jeremy Johann CMA on 11/25/2009   Method used:   Faxed to ...       [facility] [address] (retail)       [phone]       [place], [place]  [address]       Ph: [phone]       Fax: [phone]   RxID:   [id number] WARFARIN SODIUM 5 MG TABS (WARFARIN SODIUM) as directed  #30 x 1   Entered by:   Jeremy Johann CMA   Authorized by:   Loreen Freud DO  Signed by:   Jeremy Johann CMA on 11/25/2009   Method used:   Faxed to ...       [facility] [address] (retail)       [phone]       [place], [place]  [address]       Ph: [phone]       Fax: [phone]   RxID:   [id number]     ANTICOAGULATION RECORD PREVIOUS REGIMEN & LAB RESULTS Anticoagulation Diagnosis:  DVT/PE    on  11/23/2009 Previous INR Goal Range:  2 - 3 on  06/08/2008 Previous INR:  2.7 RATIO on  11/17/2007 Previous Coumadin Dose(mg):  5mg  on  11/23/2009   NEW REGIMEN & LAB RESULTS Current INR: 3.0 Current Coumadin Dose(mg): 5mg  2 tab qd, lovenox injection bid  Regimen: 5 mg daily cont lovenox for 2 more day then stop  Repeat testing in: Wednesday 11-30-09  Anticoagulation Visit Questionnaire Coumadin dose missed/changed:  No Abnormal  Bleeding Symptoms:  No  Any diet changes including alcohol intake, vegetables or greens since the last visit:  No Any illnesses or hospitalizations since the last visit:  No Any signs of clotting since the last visit (including chest discomfort, dizziness, shortness of breath, arm tingling, slurred speech, swelling or redness in leg):  No  MEDICATIONS ASPIRIN 325 MG TABS (ASPIRIN) 1 by mouth qd RED YEAST RICE 600 MG  CAPS (RED YEAST RICE EXTRACT) 1 once daily COQ10 100 MG  CAPS (COENZYME Q10) 1 once daily SAW PALMETTO 160 MG  CAPS (SAW PALMETTO (SERENOA REPENS)) 1 once daily * FISH OIL once daily MULTIVITAMINS   TABS (MULTIPLE VITAMIN) 1 by mouth once daily VITAMIN D3 1000 UNIT TABS (CHOLECALCIFEROL) once daily SUPER B-100  TABS (B COMPLEX VITAMINS) once daily WAL-MUCIL 100 % POWD (PSYLLIUM) Take one tablespoon daily FOLIC ACID 800 MCG TABS (FOLIC ACID) 1 by mouth qd * HYLAUROMIC 50MG  1 by mouth qod WARFARIN SODIUM 5 MG TABS (WARFARIN SODIUM) as directed LOVENOX 80 MG/0.8ML SOLN (ENOXAPARIN SODIUM) subcutaneously two times a day for 2 day

## 2010-03-07 NOTE — Assessment & Plan Note (Signed)
Summary: rto 2 weeks/cbs   Vital Signs:  Patient profile:   76 year old male Height:      67 inches Weight:      184 pounds Temp:     98.0 degrees F oral Pulse rate:   68 / minute BP sitting:   110 / 76  (left arm)  Vitals Entered By: Jeremy Johann CMA (August 19, 2009 10:16 AM) CC: 2 week f/u ear Comments REVIEWED MED LIST, PATIENT AGREED DOSE AND INSTRUCTION CORRECT    History of Present Illness: Pt is here for ear check .  No other complaints.    Current Medications (verified): 1)  Adult Aspirin Low Strength 81 Mg  Tbdp (Aspirin) .Marland Kitchen.. 1 Once Daily 2)  Red Yeast Rice 600 Mg  Caps (Red Yeast Rice Extract) .Marland Kitchen.. 1 Once Daily 3)  Coq10 100 Mg  Caps (Coenzyme Q10) .Marland Kitchen.. 1 Once Daily 4)  Saw Palmetto 160 Mg  Caps (Saw Palmetto (Serenoa Repens)) .Marland Kitchen.. 1 Once Daily 5)  Fish Oil .... Once Daily 6)  Multivitamins   Tabs (Multiple Vitamin) .Marland Kitchen.. 1 By Mouth Once Daily 7)  Astragalus Root   Powd (Tragacanth) 550 Mg .Marland Kitchen.. 1 By Mouth Once Daily 8)  Vitamin D3 1000 Unit Tabs (Cholecalciferol) .... Once Daily 9)  Super B-100  Tabs (B Complex Vitamins) .... Once Daily 10)  Wal-Mucil 100 % Powd (Psyllium) .... Take One Tablespoon Daily  Allergies: 1)  ! Niacin  Past History:  Past medical, surgical, family and social histories (including risk factors) reviewed for relevance to current acute and chronic problems.  Past Medical History: Reviewed history from 10/05/2008 and no changes required. Osteoarthritis Hyperlipidemia Current Problems:  DYSPHAGIA, UNSPECIFIED (ICD-787.20) HYPERLIPIDEMIA (ICD-272.4) CARDIAC MURMUR (ICD-785.2) (mild aortic sclerosis) OSTEOARTHRITIS (ICD-715.90) Blood clots  Past Surgical History: Reviewed history from 10/01/2008 and no changes required. Appendectomy Inguinal herniorrhaphy Tonsillectomy Vasectomy Vein stripping r leg Cataract extraction (02/19/2007) R eye  Family History: Reviewed history from 10/01/2008 and no changes required. Family History  of Arthritis Family History Hypertension Family History Lung cancer  M- MI at 77 Family History of Heart Disease: Mother  Social History: Reviewed history from 10/01/2008 and no changes required. 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  Otoscopic examination reveals clear canals, tympanic membranes are intact bilaterally without bulging, retraction, inflammation or discharge. Hearing is grossly normal bilaterally.   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 1 once daily  Complete Medication List: 1)  Adult Aspirin Low Strength 81 Mg Tbdp (Aspirin) .Marland Kitchen.. 1 once daily 2)  Red Yeast Rice 600 Mg Caps (Red yeast rice extract) .Marland Kitchen.. 1 once daily 3)  Coq10 100 Mg Caps (Coenzyme q10) .Marland Kitchen.. 1 once daily 4)  Saw Palmetto 160 Mg Caps (Saw palmetto (serenoa repens)) .Marland Kitchen.. 1 once daily 5)  Fish Oil  .... Once daily 6)  Multivitamins Tabs (Multiple vitamin) .Marland Kitchen.. 1 by mouth once daily 7)  Astragalus Root Powd (tragacanth) 550 Mg  .Marland KitchenMarland Kitchen. 1 by mouth once daily 8)  Vitamin D3 1000 Unit Tabs (Cholecalciferol) .... Once daily 9)  Super B-100 Tabs (B complex vitamins) .Marland KitchenMarland KitchenMarland Kitchen  Once daily 10)  Wal-mucil 100 % Powd (Psyllium) .... Take one tablespoon daily

## 2010-03-07 NOTE — Assessment & Plan Note (Signed)
Summary: hospital f/u//lch - pt/cbs   Vital Signs:  Patient profile:   76 year old male Weight:      181.8 pounds Temp:     97.0 degrees F oral BP sitting:   128 / 76  (right arm) Cuff size:   regular  Vitals Entered By: Almeta Monas CMA Duncan Dull) (December 01, 2009 10:07 AM) CC: hospt f/u   History of Present Illness: Pt here f/u from hospital for PE and mild thrombocytopenia.   Pt is off lovenox  ( since Sunday--am was last dose).    Pt doing better.   See hospital D/C.  Current Medications (verified): 1)  Red Yeast Rice 600 Mg  Caps (Red Yeast Rice Extract) .Marland Kitchen.. 1 Once Daily 2)  Coq10 100 Mg  Caps (Coenzyme Q10) .Marland Kitchen.. 1 Once Daily 3)  Saw Palmetto 160 Mg  Caps (Saw Palmetto (Serenoa Repens)) .Marland Kitchen.. 1 Once Daily 4)  Fish Oil .... Once Daily 5)  Multivitamins   Tabs (Multiple Vitamin) .Marland Kitchen.. 1 By Mouth Once Daily 6)  Vitamin D3 1000 Unit Tabs (Cholecalciferol) .... Once Daily 7)  Super B-100  Tabs (B Complex Vitamins) .... Once Daily 8)  Wal-Mucil 100 % Powd (Psyllium) .... Take One Tablespoon Daily 9)  Folic Acid 800 Mcg Tabs (Folic Acid) .Marland Kitchen.. 1 By Mouth Qd 10)  Hylauromic 50mg  .... 1 By Mouth Qod 11)  Warfarin Sodium 5 Mg Tabs (Warfarin Sodium) .... 2 By Mouth Once Daily  Allergies (verified): 1)  ! Niacin  Past History:  Past Medical History: Last updated: 10/05/2008 Osteoarthritis Hyperlipidemia Current Problems:  DYSPHAGIA, UNSPECIFIED (ICD-787.20) HYPERLIPIDEMIA (ICD-272.4) CARDIAC MURMUR (ICD-785.2) (mild aortic sclerosis) OSTEOARTHRITIS (ICD-715.90) Blood clots  Past Surgical History: Last updated: 10/01/2008 Appendectomy Inguinal herniorrhaphy Tonsillectomy Vasectomy Vein stripping r leg Cataract extraction (02/19/2007) R eye  Family History: Last updated: 10/01/2008 Family History of Arthritis Family History Hypertension Family History Lung cancer  M- MI at 68 Family History of Heart Disease: Mother  Social History: Last updated:  10/01/2008 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 Family History of Arthritis Family History Hypertension Family History Lung cancer  M- MI at 38 Family History of Heart Disease: Mother  Social History: Reviewed history from 10/01/2008 and no changes required. 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 Lungs are clear to auscultation, no crackles or wheezes. Heart:  normal rate.  + murmur Extremities:  No clubbing, cyanosis, edema, or deformity noted with normal full range of motion of all joints.   Skin:  Intact without suspicious lesions or rashes Cervical Nodes:  No lymphadenopathy noted Psych:  Cognition and judgment appear intact. Alert and cooperative with normal attention span and concentration. No apparent delusions, illusions, hallucinations   Impression & Recommendations:  Problem # 1:  THROMBOCYTOPENIA (ICD-287.5)  Orders: Venipuncture ([address]) TLB-CBC Platelet - w/Differential (85025-CBCD)  Problem # 2:  PULMONARY EMBOLISM, HX OF (ICD-V12.51)  The following medications were removed from the medication list:    Aspirin 325 Mg Tabs (Aspirin) .Marland Kitchen... 1 by mouth qd    Lovenox 80 Mg/0.94ml Soln (Enoxaparin sodium) .Marland KitchenMarland KitchenMarland KitchenMarland Kitchen  Subcutaneously two times a day for 2 day His updated medication list for this problem includes:    Warfarin Sodium 5 Mg Tabs (Warfarin sodium) .Marland Kitchen... 2 by mouth once daily  Reviewed the  following: PT: 29.0 (11/17/2007)   INR: 5.4 (12/01/2009)    Coumadin Dose (weekly): 70 mg (11/23/2009) Prior Coumadin Dose (weekly): 70 mg (11/23/2009) Next Protime: Wednesday 11-30-09 (dated on 11/25/2009)  Problem # 3:  FACTOR V DEFICIENCY (ICD-286.3)  Orders: Venipuncture ([address]) TLB-CBC Platelet - w/Differential (85025-CBCD)  Problem # 4:  AORTIC STENOSIS, MILD (ICD-424.1)  The following medications were removed from the medication list:    Aspirin 325 Mg Tabs (Aspirin) .Marland Kitchen... 1 by mouth qd His updated medication list for this problem includes:    Warfarin Sodium 5 Mg Tabs (Warfarin sodium) .Marland Kitchen... 2 by mouth once daily  Problem # 5:  HYPERLIPIDEMIA (ICD-272.4)  Labs Reviewed: SGOT: 27 (09/06/2009)   SGPT: 20 (09/06/2009)   HDL:31.50 (09/06/2009), 33.30 (09/03/2008)  LDL:64 (09/06/2009), 64 (09/03/2008)  Chol:126 (09/06/2009), 127 (09/03/2008)  Trig:152.0 (09/06/2009), 151.0 (09/03/2008)  Complete Medication List: 1)  Red Yeast Rice 600 Mg Caps (Red yeast rice extract) .Marland Kitchen.. 1 once daily 2)  Coq10 100 Mg Caps (Coenzyme q10) .Marland Kitchen.. 1 once daily 3)  Saw Palmetto 160 Mg Caps (Saw palmetto (serenoa repens)) .Marland Kitchen.. 1 once daily 4)  Fish Oil  .... Once daily 5)  Multivitamins Tabs (Multiple vitamin) .Marland Kitchen.. 1 by mouth once daily 6)  Vitamin D3 1000 Unit Tabs (Cholecalciferol) .... Once daily 7)  Super B-100 Tabs (B complex vitamins) .... Once daily 8)  Wal-mucil 100 % Powd (Psyllium) .... Take one tablespoon daily 9)  Folic Acid 800 Mcg Tabs (Folic acid) .Marland Kitchen.. 1 by mouth qd 10)  Hylauromic 50mg   .... 1 by mouth qod 11)  Warfarin Sodium 5 Mg Tabs (Warfarin sodium) .... 2 by mouth once daily  Patient Instructions: 1)  Coumadin 5 mg tab---2 tab on MWF and 5 mg all other days----  hold coumadin tonight 2)  recheck 1 week 3)  labs to be done in February 4)  ov 3 months   Orders Added: 1)  Venipuncture [88416] 2)  TLB-CBC Platelet - w/Differential [85025-CBCD] 3)  Est. Patient Level IV  [60630]    Laboratory Results   Blood Tests      INR: 5.4   (Normal Range: 0.88-1.12   Therap INR: 2.0-3.5)

## 2010-03-07 NOTE — Letter (Signed)
Summary: Pre Visit Letter Revised  [facility]  [address] [place], [place] [address]   Phone: [phone]  Fax: [phone]        11/16/2009 MRN: [id number] Shawn [name] [address] [place], [place]  [address]             Procedure Date:  12/01/2009   Welcome to the Gastroenterology Division at [facility].    You are scheduled to see a nurse for your pre-procedure visit on 11/21/2009 at 10:00AM on the 3rd floor at [facility], [address]  We ask that you try to arrive at our office 15 minutes prior to your appointment time to allow for check-in.  Please take a minute to review the attached form.  If you answer "Yes" to one or more of the questions on the first page, we ask that you call the person listed at your earliest opportunity.  If you answer "No" to all of the questions, please complete the rest of the form and bring it to your appointment.    Your nurse visit will consist of discussing your medical and surgical history, your immediate family medical history, and your medications.   If you are unable to list all of your medications on the form, please bring the medication bottles to your appointment and we will list them.  We will need to be aware of both prescribed and over the counter drugs.  We will need to know exact dosage information as well.    Please be prepared to read and sign documents such as consent forms, a financial agreement, and acknowledgement forms.  If necessary, and with your consent, a friend or relative is welcome to sit-in on the nurse visit with you.  Please bring your insurance card so that we may make a copy of it.  If your insurance requires a referral to see a specialist, please bring your referral form from your primary care physician.  No co-pay is required for this nurse visit.     If you cannot keep your appointment, please call [phone] to cancel or reschedule prior to your appointment date.   This allows [place] the opportunity to schedule an appointment for another patient in need of care.    Thank you for choosing [facility] for your medical needs.  We appreciate the opportunity to care for you.  Please visit [place] at our website  to learn more about our practice.  Sincerely, The Gastroenterology Division

## 2010-03-07 NOTE — Medication Information (Signed)
Summary: new PE; previously on Coumadin  Anticoagulant Therapy  Managed by: [name] PharmD Referring MD: Laury Axon PCP: Dr. Laury Axon Supervising MD: Graciela Husbands MD, Viviann Spare Indication 1: DVT/PE    Indication 2: Factor V Leiden Lab Used: [facility] INR POC 1.7 INR RANGE 2 - 3  Dietary changes: no    Health status changes: no    Bleeding/hemorrhagic complications: no    Recent/future hospitalizations: yes       Details: Pt discharged on 10/18 with new PE.  Has a remote history of DVT and was adequately treated with Coumadin   Any changes in medication regimen? yes       Details: on Lovenox bridge day #4   Recent/future dental: no  Any missed doses?: no       Is patient compliant with meds? yes      Comments: Pt given 7.5mg  x 2 days in hospital and started on Lovenox two times a day.  Discharged on 10mg  daily.  INR on 10/18- 1.14.  Pt educated on bleeding risks, dietary concerns, and medication interactions.   Of note, pt was previously on 5mg  daily and therapeutic.    Allergies: 1)  ! Niacin  Anticoagulation Management History:      The patient comes in today for his initial visit for anticoagulation therapy.  Positive risk factors for bleeding include an age of 74 years or older.  The bleeding index is 'intermediate risk'.  Negative CHADS2 values include Age > 26 years old.  The start date was 11/11/2007.  His last INR was 2.7 RATIO.  Anticoagulation responsible provider: Graciela Husbands MD, Viviann Spare.  INR POC: 1.7.    Anticoagulation Management Assessment/Plan:      The target INR is 2 - 3.  The next INR is due 11/25/2009.  Anticoagulation instructions were given to patient.  Results were reviewed/authorized by [name] PharmD.  He was notified by [name] PharmD.         Prior Anticoagulation Instructions: 5MG  QD  Current Anticoagulation Instructions: INR 1.7  Continue 2 tablets today and tomorrow.  Continue Lovenox injections. Recheck INR in 2 days with Dr. Laury Axon

## 2010-03-07 NOTE — Letter (Signed)
Summary: [facility]: Immunoassay Fecal Occult Blood (iFOB) Order Form  [facility]  [address] [place], [place] [address]   Phone: [phone]  Fax: [phone]      [facility]: Immunoassay Fecal Occult Blood (iFOB) Order Form   September 07, 2009 MRN: [id number]   Shawn [name] 16-Aug-1934   Physicican Name: Loreen Freud, DO  Diagnosis Code:  V70.0      Loreen Freud, DO

## 2010-03-07 NOTE — Letter (Signed)
Summary: Colonoscopy Letter  [facility]  [address] [place], [place] [address]   Phone: [phone]  Fax: [phone]      October 26, 2009 MRN: [id number]   DAKING WESTERVELT [address] [place], [place]  [address]   Dear Mr. Sova,   According to your medical record, it is time for you to schedule a Colonoscopy. The American Cancer Society recommends this procedure as a method to detect early colon cancer. Patients with a family history of colon cancer, or a personal history of colon polyps or inflammatory bowel disease are at increased risk.  This letter has beeen generated based on the recommendations made at the time of your procedure. If you feel that in your particular situation this may no longer apply, please contact our office.  Please call our office at [phone] to schedule this appointment or to update your records at your earliest convenience.  Thank you for cooperating with [place] to provide you with the very best care possible.   Sincerely,  Hedwig Morton. Juanda Chance, M.D.  [facility] Gastroenterology Division [phone]

## 2010-03-07 NOTE — Assessment & Plan Note (Signed)
Summary: pt/cbs  Nurse Visit   Vital Signs:  Patient profile:   76 year old male Height:      67 inches Weight:      183 pounds Temp:     97.5 degrees F oral Pulse rate:   76 / minute BP sitting:   130 / 78  (left arm)  Vitals Entered By: Jeremy Johann CMA (December 08, 2009 9:34 AM)  Patient Instructions: 1)  Hold warfarin today and tomorrow. Then take 5mg  every day and recheck on Monday 12-12-09.  CC: PT/INR   Current Medications (verified): 1)  Red Yeast Rice 600 Mg  Caps (Red Yeast Rice Extract) .Marland Kitchen.. 1 Once Daily 2)  Coq10 100 Mg  Caps (Coenzyme Q10) .Marland Kitchen.. 1 Once Daily 3)  Saw Palmetto 160 Mg  Caps (Saw Palmetto (Serenoa Repens)) .Marland Kitchen.. 1 Once Daily 4)  Fish Oil .... Once Daily 5)  Multivitamins   Tabs (Multiple Vitamin) .Marland Kitchen.. 1 By Mouth Once Daily 6)  Vitamin D3 1000 Unit Tabs (Cholecalciferol) .... Once Daily 7)  Super B-100  Tabs (B Complex Vitamins) .... Once Daily 8)  Wal-Mucil 100 % Powd (Psyllium) .... Take One Tablespoon Daily 9)  Folic Acid 800 Mcg Tabs (Folic Acid) .Marland Kitchen.. 1 By Mouth Qd 10)  Hylauromic 50mg  .... 1 By Mouth Qod 11)  Warfarin Sodium 5 Mg Tabs (Warfarin Sodium) .... 2 By Mouth Once Daily  Allergies (verified): 1)  ! Niacin Laboratory Results   Blood Tests    Date/Time Reported: December 08, 2009 9:35 AM   INR: 5.6   (Normal Range: 0.88-1.12   Therap INR: 2.0-3.5)    Orders Added: 1)  Est. Patient Level I [[address]] 2)  Protime [60454UJ] 3)  Specimen Handling [99000] 4)  TLB-PT (Protime) [85610-PTP]    ANTICOAGULATION RECORD PREVIOUS REGIMEN & LAB RESULTS Anticoagulation Diagnosis:  DVT/PE    on  11/23/2009 Previous INR Goal Range:  2 - 3 on  06/08/2008 Previous INR:  5.4 on  12/01/2009 Previous Coumadin Dose(mg):  5mg  2 tab qd, lovenox injection bid  on  11/25/2009 Previous Regimen:  5 mg daily cont lovenox for 2 more day then stop on  11/25/2009  NEW REGIMEN & LAB RESULTS Current INR: 5.6 Current Coumadin Dose(mg): (10mg ) 2 Tabs  MWF, (5mg ) 1 tab all other days Regimen: Hold warfarin today and tomorrow. Then take 5mg  every day and recheck on Monday   Repeat testing in: Monday 12-12-09  Anticoagulation Visit Questionnaire Coumadin dose missed/changed:  No Abnormal Bleeding Symptoms:  No  Any diet changes including alcohol intake, vegetables or greens since the last visit:  No Any illnesses or hospitalizations since the last visit:  No Any signs of clotting since the last visit (including chest discomfort, dizziness, shortness of breath, arm tingling, slurred speech, swelling or redness in leg):  No  MEDICATIONS RED YEAST RICE 600 MG  CAPS (RED YEAST RICE EXTRACT) 1 once daily COQ10 100 MG  CAPS (COENZYME Q10) 1 once daily SAW PALMETTO 160 MG  CAPS (SAW PALMETTO (SERENOA REPENS)) 1 once daily * FISH OIL once daily MULTIVITAMINS   TABS (MULTIPLE VITAMIN) 1 by mouth once daily VITAMIN D3 1000 UNIT TABS (CHOLECALCIFEROL) once daily SUPER B-100  TABS (B COMPLEX VITAMINS) once daily WAL-MUCIL 100 % POWD (PSYLLIUM) Take one tablespoon daily FOLIC ACID 800 MCG TABS (FOLIC ACID) 1 by mouth qd * HYLAUROMIC 50MG  1 by mouth qod WARFARIN SODIUM 5 MG TABS (WARFARIN SODIUM) 2 by mouth once daily

## 2010-03-07 NOTE — Assessment & Plan Note (Signed)
Summary: pt/cbs  Nurse Visit   Vital Signs:  Patient profile:   76 year old male Weight:      185 pounds (84.09 kg) Temp:     98.1 degrees F (36.72 degrees C) oral BP sitting:   110 / 62  (left arm) Cuff size:   regular  Vitals Entered By: Lucious Groves CMA (December 12, 2009 2:29 PM) CC: PT check./kb   Allergies: 1)  ! Niacin Laboratory Results   Blood Tests      INR: 2.0   (Normal Range: 0.88-1.12   Therap INR: 2.0-3.5)    Orders Added: 1)  Est. Patient Level I [44010] 2)  Protime [[id number]]   ANTICOAGULATION RECORD PREVIOUS REGIMEN & LAB RESULTS Anticoagulation Diagnosis:  DVT/PE    on  11/23/2009 Previous INR Goal Range:  2 - 3 on  06/08/2008 Previous INR:  6.3 ratio on  12/08/2009 Previous Coumadin Dose(mg):  (10mg ) 2 Tabs MWF, (5mg ) 1 tab all other days on  12/08/2009 Previous Regimen:  Hold warfarin today and tomorrow. Then take 5mg  every day and recheck on Monday  on  12/08/2009  NEW REGIMEN & LAB RESULTS Anticoag. Dx: Deep venous thrombosis Current INR: 2.0 Current Coumadin Dose(mg): 5mg  po qd Regimen: Hold warfarin today and tomorrow. Then take 5mg  every day and recheck on Monday   (no change)  Provider: Laury Axon Repeat testing in: 1 week Other Comments: Patient notes that he has 5mg  tabs at home and was just changed to 1 by mouth once daily. I advised patient to continue 1 tab by mouth once daily and re-check in one week. Lucious Groves CMA  December 12, 2009 2:30 PM    Anticoagulation Visit Questionnaire Coumadin dose missed/changed:  No Abnormal Bleeding Symptoms:  No  Any diet changes including alcohol intake, vegetables or greens since the last visit:  No Any illnesses or hospitalizations since the last visit:  No Any signs of clotting since the last visit (including chest discomfort, dizziness, shortness of breath, arm tingling, slurred speech, swelling or redness in leg):  No  MEDICATIONS RED YEAST RICE 600 MG  CAPS (RED YEAST RICE EXTRACT) 1 once  daily COQ10 100 MG  CAPS (COENZYME Q10) 1 once daily SAW PALMETTO 160 MG  CAPS (SAW PALMETTO (SERENOA REPENS)) 1 once daily * FISH OIL once daily MULTIVITAMINS   TABS (MULTIPLE VITAMIN) 1 by mouth once daily VITAMIN D3 1000 UNIT TABS (CHOLECALCIFEROL) once daily SUPER B-100  TABS (B COMPLEX VITAMINS) once daily WAL-MUCIL 100 % POWD (PSYLLIUM) Take one tablespoon daily FOLIC ACID 800 MCG TABS (FOLIC ACID) 1 by mouth qd * HYLAUROMIC 50MG  1 by mouth qod WARFARIN SODIUM 5 MG TABS (WARFARIN SODIUM) 2 by mouth once daily

## 2010-03-07 NOTE — Assessment & Plan Note (Signed)
Summary: CPX//PH   Vital Signs:  Patient profile:   76 year old male Height:      67 inches (170.18 cm) Weight:      184 pounds (83.64 kg) BMI:     28.92 Temp:     98.1 degrees F (36.72 degrees C) oral BP sitting:   130 / 72  (right arm) Cuff size:   regular  Vitals Entered By: Lucious Groves CMA (September 06, 2009 8:29 AM) CC: CPX./kb Is Patient Diabetic? No Pain Assessment Patient in pain? no       Does patient need assistance? Functional Status Self care, Cook/clean, Shopping, Social activities Ambulation Normal Comments Pt is able to perform all ADLs indp and is able to read and write.   Vision Screening:      Vision Comments: optho q1y---vision corrected 20/20 with glasses  40db HL: Left  Right  Audiometry Comment: hearing grossly normal    History of Present Illness: Pt here for cpe and lab.  No complaints.    Hyperlipidemia follow-up      This is a 76 year old man who presents for Hyperlipidemia follow-up.  The patient denies muscle aches, GI upset, abdominal pain, flushing, itching, constipation, diarrhea, and fatigue.  The patient denies the following symptoms: chest pain/pressure, exercise intolerance, dypsnea, palpitations, syncope, and pedal edema.  Compliance with medications (by patient report) has been near 100%.  Dietary compliance has been good.  The patient reports exercising occasionally.  Adjunctive measures currently used by the patient include ASA, fish oil supplements, and weight reduction.    Preventive Screening-Counseling & Management  Alcohol-Tobacco     Alcohol drinks/day: 0     Alcohol type: wine     Smoking Status: never  Caffeine-Diet-Exercise     Caffeine use/day: 0     Does Patient Exercise: yes     Type of exercise: walking     Exercise (avg: min/session): <30     Times/week: <3  Hep-HIV-STD-Contraception     HIV Risk: no     Dental Visit-last 6 months yes     Dental Care Counseling: not indicated; dental care within six  months     Sun Exposure-Excessive: no  Safety-Violence-Falls     Seat Belt Use: yes     Firearms in the Home: firearms in the home     Firearm Counseling: not indicated; uses recommended firearm safety measures     Smoke Detectors: yes     Smoke Detector Counseling: no     Violence in the Home: no risk noted     Violence Counseling: not applicable     Sexual Abuse: no     Sexual Abuse Counseling: no     Fall Risk: no      Sexual History:  currently monogamous.    Problems Prior to Update: 1)  Rom  (ICD-382.9) 2)  Cerumen Impaction, Right  (ICD-380.4) 3)  Varicose Veins Lower Extremities W/inflammation  (ICD-454.1) 4)  Dvt  (ICD-453.40) 5)  Hepatic Cyst  (ICD-573.8) 6)  Aortic Stenosis, Mild  (ICD-424.1) 7)  Calf Pain, Left  (ICD-729.5) 8)  Memory Loss  (ICD-780.93) 9)  Preventive Health Care  (ICD-V70.0) 10)  Mole  (ICD-216.9) 11)  Dysphagia, Unspecified  (ICD-787.20) 12)  Hyperlipidemia  (ICD-272.4) 13)  Cardiac Murmur  (ICD-785.2) 14)  Osteoarthritis  (ICD-715.90)  Medications Prior to Update: 1)  Adult Aspirin Low Strength 81 Mg  Tbdp (Aspirin) .Marland Kitchen.. 1 Once Daily 2)  Red Yeast Rice 600 Mg  Caps (  Red Yeast Rice Extract) .Marland Kitchen.. 1 Once Daily 3)  Coq10 100 Mg  Caps (Coenzyme Q10) .Marland Kitchen.. 1 Once Daily 4)  Saw Palmetto 160 Mg  Caps (Saw Palmetto (Serenoa Repens)) .Marland Kitchen.. 1 Once Daily 5)  Fish Oil .... Once Daily 6)  Multivitamins   Tabs (Multiple Vitamin) .Marland Kitchen.. 1 By Mouth Once Daily 7)  Astragalus Root   Powd (Tragacanth) 550 Mg .Marland Kitchen.. 1 By Mouth Once Daily 8)  Vitamin D3 1000 Unit Tabs (Cholecalciferol) .... Once Daily 9)  Super B-100  Tabs (B Complex Vitamins) .... Once Daily 10)  Wal-Mucil 100 % Powd (Psyllium) .... Take One Tablespoon Daily  Current Medications (verified): 1)  Aspirin 325 Mg Tabs (Aspirin) .Marland Kitchen.. 1 By Mouth Qd 2)  Red Yeast Rice 600 Mg  Caps (Red Yeast Rice Extract) .Marland Kitchen.. 1 Once Daily 3)  Coq10 100 Mg  Caps (Coenzyme Q10) .Marland Kitchen.. 1 Once Daily 4)  Saw Palmetto 160 Mg   Caps (Saw Palmetto (Serenoa Repens)) .Marland Kitchen.. 1 Once Daily 5)  Fish Oil .... Once Daily 6)  Multivitamins   Tabs (Multiple Vitamin) .Marland Kitchen.. 1 By Mouth Once Daily 7)  Astragalus Root   Powd (Tragacanth) 550 Mg .Marland Kitchen.. 1 By Mouth Once Daily 8)  Vitamin D3 1000 Unit Tabs (Cholecalciferol) .... Once Daily 9)  Super B-100  Tabs (B Complex Vitamins) .... Once Daily 10)  Wal-Mucil 100 % Powd (Psyllium) .... Take One Tablespoon Daily 11)  Folic Acid 800 Mcg Tabs (Folic Acid) .Marland Kitchen.. 1 By Mouth Qd 12)  Hylauromic 50mg  .... 1 By Mouth Qod  Allergies (verified): 1)  ! Niacin  Past History:  Past Medical History: Last updated: 10/05/2008 Osteoarthritis Hyperlipidemia Current Problems:  DYSPHAGIA, UNSPECIFIED (ICD-787.20) HYPERLIPIDEMIA (ICD-272.4) CARDIAC MURMUR (ICD-785.2) (mild aortic sclerosis) OSTEOARTHRITIS (ICD-715.90) Blood clots  Past Surgical History: Last updated: 10/01/2008 Appendectomy Inguinal herniorrhaphy Tonsillectomy Vasectomy Vein stripping r leg Cataract extraction (02/19/2007) R eye  Family History: Last updated: 10/01/2008 Family History of Arthritis Family History Hypertension Family History Lung cancer  M- MI at 42 Family History of Heart Disease: Mother  Social History: Last updated: 10/01/2008 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 Family History of Arthritis Family History Hypertension Family History Lung cancer  M- MI at 53 Family History of Heart Disease: Mother  Social History: Reviewed history from 10/01/2008 and no changes required. 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 Eyes:  Denies blurring, discharge, double vision, eye irritation, eye pain, halos, itching, light sensitivity, red eye, vision loss-1 eye, and vision loss-both eyes. ENT:  Denies decreased hearing, difficulty swallowing, ear discharge, earache, hoarseness, nasal congestion, nosebleeds, postnasal drainage, ringing in ears, sinus pressure, and sore throat. CV:  Denies bluish discoloration of lips or nails, chest pain or discomfort, difficulty breathing at night, difficulty breathing while lying down, fainting, fatigue, leg cramps with exertion, lightheadness, near fainting, palpitations, shortness of breath with exertion, swelling of feet, swelling of hands, and weight gain. Resp:  Denies chest discomfort, chest pain with inspiration, cough, coughing up blood, excessive snoring, hypersomnolence, morning headaches, pleuritic, shortness of breath, sputum productive, and  wheezing. GI:  Denies abdominal pain, bloody stools, change in bowel habits, constipation, dark tarry stools, diarrhea, excessive appetite, gas, hemorrhoids, indigestion, loss of appetite, and nausea. GU:  Denies decreased libido, discharge, dysuria, erectile dysfunction, genital sores, hematuria, incontinence, nocturia, urinary frequency, and urinary hesitancy. MS:  Denies joint pain, joint redness, joint swelling, loss of strength, low back pain, mid back pain, muscle aches, muscle , cramps, muscle weakness, stiffness, and thoracic pain. Derm:  Denies changes in color of skin, changes in nail beds, dryness, excessive perspiration, flushing, hair loss, insect bite(s), itching, lesion(s), poor wound healing, and rash; derm q1y. Neuro:  Denies brief paralysis, difficulty with concentration, disturbances in coordination, falling down, headaches, inability to speak, memory loss, numbness, poor balance, seizures, sensation  of room spinning, tingling, tremors, visual disturbances, and weakness. Psych:  Denies alternate hallucination ( auditory/visual), anxiety, depression, easily angered, easily tearful, irritability, mental problems, panic attacks, sense of great danger, suicidal thoughts/plans, thoughts of violence, unusual visions or sounds, and thoughts /plans of harming others. Endo:  Denies cold intolerance, excessive hunger, excessive thirst, excessive urination, heat intolerance, polyuria, and weight change. Heme:  Denies abnormal bruising, bleeding, enlarge lymph nodes, fevers, pallor, and skin discoloration. Allergy:  Denies hives or rash, itching eyes, persistent infections, seasonal allergies, and sneezing.  Physical Exam  General:  Well-developed,well-nourished,in no acute distress; alert,appropriate and cooperative throughout examination Head:  Normocephalic and atraumatic without obvious abnormalities. No apparent alopecia or balding. Eyes:  pupils equal, pupils round, pupils reactive to light, and no injection.   Ears:  External ear exam shows no significant lesions or deformities.  Otoscopic examination reveals clear canals, tympanic membranes are intact bilaterally without bulging, retraction, inflammation or discharge. Hearing is grossly normal bilaterally. Nose:  External nasal examination shows no deformity or inflammation. Nasal mucosa are pink and moist without lesions or exudates. Mouth:  Oral mucosa and oropharynx without lesions or exudates.  Teeth in good repair. Neck:  No deformities, masses, or tenderness noted.no carotid bruits.   Chest Wall:  No deformities, masses, tenderness or gynecomastia noted. Lungs:  Normal respiratory effort, chest expands symmetrically. Lungs are clear to auscultation, no crackles or wheezes. Heart:  normal rate and Grade  2 /6 systolic ejection murmur.   Abdomen:  Bowel sounds positive,abdomen soft and non-tender without masses, organomegaly or hernias  noted. Rectal:  No external abnormalities noted. Normal sphincter tone. No rectal masses or tenderness. Genitalia:  Testes bilaterally descended without nodularity, tenderness or masses. No scrotal masses or lesions. No penis lesions or urethral discharge. Prostate:  no nodules, no asymmetry, no induration, and 1+ enlarged.   Msk:  normal ROM, no joint tenderness, no joint swelling, no joint warmth, no redness over joints, no joint deformities, and no joint instability.   Pulses:  R posterior tibial normal, R dorsalis pedis normal, R carotid normal, L posterior tibial normal, L dorsalis pedis normal, and L carotid normal.   Extremities:  No clubbing, cyanosis, edema, or deformity noted with normal full range of motion of all joints.   Neurologic:  No cranial nerve deficits noted. Station and gait are normal. Plantar reflexes are down-going bilaterally. DTRs are symmetrical throughout. Sensory, motor and coordinative functions appear intact. Skin:  Intact without suspicious lesions or rashes Cervical Nodes:  No lymphadenopathy noted Psych:  Cognition and judgment appear intact. Alert and cooperative with normal attention span and concentration. No apparent delusions, illusions, hallucinations   Impression & Recommendations:  Problem # 1:  PREVENTIVE HEALTH CARE (ICD-V70.0)  Orders: Venipuncture (  16109) TLB-Lipid Panel (80061-LIPID) TLB-BMP (Basic Metabolic Panel-BMET) (80048-METABOL) TLB-CBC Platelet - w/Differential (85025-CBCD) TLB-Hepatic/Liver Function Pnl (80076-HEPATIC) TLB-PSA (Prostate Specific Antigen) (84153-PSA) First annual wellness visit with prevention plan  (U0454) EKG w/ Interpretation (93000)  Reviewed preventive care protocols, scheduled due services, and updated immunizations.  Problem # 2:  AORTIC STENOSIS, MILD (ICD-424.1)  His updated medication list for this problem includes:    Aspirin 325 Mg Tabs (Aspirin) .Marland Kitchen... 1 by mouth qd  Orders: Venipuncture  (09811) TLB-Lipid Panel (80061-LIPID) TLB-BMP (Basic Metabolic Panel-BMET) (80048-METABOL) TLB-CBC Platelet - w/Differential (85025-CBCD) TLB-Hepatic/Liver Function Pnl (80076-HEPATIC) TLB-PSA (Prostate Specific Antigen) (84153-PSA)  Echocardiogram:  -  Overall left ventricular systolic function was normal. Left         ventricular ejection fraction was estimated , range being 60         % to 65 %.   -  AV is mildly sclerotic. Question functionally bicuspid. There was         mild aortic valvular regurgitation.   -  Incidental finding is clear cystic region in liver (2.6 x 2.1         cm), question simple cyst. Suggest follow up.    IMPRESSIONS   -  Incidental finding is clear cystic region in liver (2.6 x 2.1         cm), question simple cyst. Suggest follow up. (09/12/2006)  Problem # 3:  HYPERLIPIDEMIA (ICD-272.4)  Orders: Venipuncture (91478) TLB-Lipid Panel (80061-LIPID) TLB-BMP (Basic Metabolic Panel-BMET) (80048-METABOL) TLB-CBC Platelet - w/Differential (85025-CBCD) TLB-Hepatic/Liver Function Pnl (80076-HEPATIC) TLB-PSA (Prostate Specific Antigen) (84153-PSA)  Labs Reviewed: SGOT: 27 (09/03/2008)   SGPT: 17 (09/03/2008)   HDL:33.30 (09/03/2008), 30.1 (08/11/2007)  LDL:64 (09/03/2008), 47 (08/11/2007)  Chol:127 (09/03/2008), 116 (08/11/2007)  Trig:151.0 (09/03/2008), 197 (08/11/2007)  Problem # 4:  OSTEOARTHRITIS (ICD-715.90)  His updated medication list for this problem includes:    Aspirin 325 Mg Tabs (Aspirin) .Marland Kitchen... 1 by mouth qd  Complete Medication List: 1)  Aspirin 325 Mg Tabs (Aspirin) .Marland Kitchen.. 1 by mouth qd 2)  Red Yeast Rice 600 Mg Caps (Red yeast rice extract) .Marland Kitchen.. 1 once daily 3)  Coq10 100 Mg Caps (Coenzyme q10) .Marland Kitchen.. 1 once daily 4)  Saw Palmetto 160 Mg Caps (Saw palmetto (serenoa repens)) .Marland Kitchen.. 1 once daily 5)  Fish Oil  .... Once daily 6)  Multivitamins Tabs (Multiple vitamin) .Marland Kitchen.. 1 by mouth once daily 7)  Astragalus Root Powd (tragacanth) 550 Mg  .Marland KitchenMarland Kitchen. 1  by mouth once daily 8)  Vitamin D3 1000 Unit Tabs (Cholecalciferol) .... Once daily 9)  Super B-100 Tabs (B complex vitamins) .... Once daily 10)  Wal-mucil 100 % Powd (Psyllium) .... Take one tablespoon daily 11)  Folic Acid 800 Mcg Tabs (Folic acid) .Marland Kitchen.. 1 by mouth qd 12)  Hylauromic 50mg   .... 1 by mouth qod   EKG  Procedure date:  09/06/2009  Findings:      Normal sinus rhythm with rate of:  64  Appended Document: CPX//PH  Laboratory Results   Urine Tests   Date/Time Reported: September 06, 2009 11:32 AM   Routine Urinalysis   Color: orange Appearance: Clear Glucose: negative   (Normal Range: Negative) Bilirubin: negative   (Normal Range: Negative) Ketone: negative   (Normal Range: Negative) Spec. Gravity: 1.025   (Normal Range: 1.003-1.035) Blood: negative   (Normal Range: Negative) pH: 5.0   (Normal Range: 5.0-8.0) Protein: negative   (Normal Range: Negative) Urobilinogen: negative   (Normal Range: 0-1) Nitrite: negative   (Normal  Range: Negative) Leukocyte Esterace: negative   (Normal Range: Negative)    Comments: Floydene Flock  September 06, 2009 11:32 AM

## 2010-03-08 ENCOUNTER — Encounter: Payer: Self-pay | Admitting: Family Medicine

## 2010-03-08 ENCOUNTER — Ambulatory Visit (INDEPENDENT_AMBULATORY_CARE_PROVIDER_SITE_OTHER): Payer: PRIVATE HEALTH INSURANCE

## 2010-03-08 DIAGNOSIS — I2699 Other pulmonary embolism without acute cor pulmonale: Secondary | ICD-10-CM

## 2010-03-08 DIAGNOSIS — I82409 Acute embolism and thrombosis of unspecified deep veins of unspecified lower extremity: Secondary | ICD-10-CM

## 2010-03-08 DIAGNOSIS — Z7901 Long term (current) use of anticoagulants: Secondary | ICD-10-CM

## 2010-03-08 LAB — CONVERTED CEMR LAB: INR: 2.2

## 2010-03-09 NOTE — Assessment & Plan Note (Signed)
Summary: pt check//lch  Nurse Visit   Allergies: 1)  ! Niacin Laboratory Results   Blood Tests      INR: 1.7   (Normal Range: 0.88-1.12   Therap INR: 2.0-3.5)    Orders Added: 1)  T-Protime, Auto [[id number]] 2)  Est. Patient Level I [28413] 3)  Protime [[id number]] 4)  Specimen Handling [99000]   ANTICOAGULATION RECORD PREVIOUS REGIMEN & LAB RESULTS Anticoagulation Diagnosis:  Deep venous thrombosis on  02/01/2010 Previous INR Goal Range:  2-3 on  02/01/2010 Previous INR:  2.5 on  02/01/2010 Previous Coumadin Dose(mg):  5mg  tab once daily on  02/01/2010 Previous Regimen:  same  on  02/01/2010  NEW REGIMEN & LAB RESULTS Anticoag. Dx: Deep venous thrombosis Current INR Goal Range: 2-3 Current INR: 1.7 Current Coumadin Dose(mg): 5 mg once daily Regimen: take an extra 1/2 of tablet today and continue with 5mg  once daily  Provider: Loreen Freud DO Repeat testing in: 1 week Other Comments: We will also check Protime in the lab and call patient with any changes  Dose has been reviewed with patient or caretaker during this visit. Reviewed by: Almeta Monas

## 2010-03-09 NOTE — Assessment & Plan Note (Signed)
Summary: pt/cbs  Nurse Visit   Vital Signs:  Patient profile:   76 year old male Weight:      187.4 pounds Temp:     97.8 degrees F oral Pulse rate:   76 / minute Pulse rhythm:   regular BP sitting:   128 / 70  (left arm) Cuff size:   regular  Vitals Entered By: Almeta Monas CMA Duncan Dull) (February 01, 2010 9:40 AM)  Current Medications (verified): 1)  Red Yeast Rice 600 Mg  Caps (Red Yeast Rice Extract) .Marland Kitchen.. 1 Once Daily 2)  Coq10 100 Mg  Caps (Coenzyme Q10) .Marland Kitchen.. 1 Once Daily 3)  Saw Palmetto 160 Mg  Caps (Saw Palmetto (Serenoa Repens)) .Marland Kitchen.. 1 Once Daily 4)  Fish Oil .... Once Daily 5)  Multivitamins   Tabs (Multiple Vitamin) .Marland Kitchen.. 1 By Mouth Once Daily 6)  Vitamin D3 1000 Unit Tabs (Cholecalciferol) .... Once Daily 7)  Super B-100  Tabs (B Complex Vitamins) .... Once Daily 8)  Wal-Mucil 100 % Powd (Psyllium) .... Take One Tablespoon Daily 9)  Folic Acid 800 Mcg Tabs (Folic Acid) .Marland Kitchen.. 1 By Mouth Qd 10)  Hylauromic 50mg  .... 1 By Mouth Qod 11)  Warfarin Sodium 5 Mg Tabs (Warfarin Sodium) .... Take 1 By Mouth Once Daily  Allergies (verified): 1)  ! Niacin Laboratory Results   Blood Tests      INR: 2.5   (Normal Range: 0.88-1.12   Therap INR: 2.0-3.5)     ANTICOAGULATION RECORD PREVIOUS REGIMEN & LAB RESULTS Anticoagulation Diagnosis:  Deep venous thrombosis on  12/12/2009 Previous INR Goal Range:  2 - 3 on  06/08/2008 Previous INR:  2.5 on  01/02/2010 Previous Coumadin Dose(mg):  (5mg ) tab once daily on  01/02/2010 Previous Regimen:  SAME on  01/02/2010  NEW REGIMEN & LAB RESULTS Anticoag. Dx: Deep venous thrombosis Current INR Goal Range: 2-3 Current INR: 2.5 Current Coumadin Dose(mg): 5mg  tab once daily Regimen: same   Provider: Lowne,Yvonne Repeat testing in: 1 month Other Comments: Review by Dr.Tabori  Dose has been reviewed with patient or caretaker during this visit. Reviewed by: Almeta Monas   Appended Document: Orders Update    Clinical  Lists Changes  Orders: Added new Service order of Est. Patient Level I (11914) - Signed Added new Service order of Protime ([id number]) - Signed

## 2010-03-15 NOTE — Medication Information (Addendum)
Summary: pt check/kn  Anticoagulant Therapy  Managed by: Inactive Referring MD: Laury Axon PCP: Dr. Laury Axon Supervising MD: Graciela Husbands MD, Viviann Spare Indication 1: Deep venous thrombosis Indication 2: Factor V Leiden Lab Used: [facility] INR RANGE 2-3  Vital Signs: Weight: 188 lbs.  Blood Pressure:  118 / 62            Current Medications (verified): 1)  Red Yeast Rice 600 Mg  Caps (Red Yeast Rice Extract) .Marland Kitchen.. 1 Once Daily 2)  Coq10 100 Mg  Caps (Coenzyme Q10) .Marland Kitchen.. 1 Once Daily 3)  Saw Palmetto 160 Mg  Caps (Saw Palmetto (Serenoa Repens)) .Marland Kitchen.. 1 Once Daily 4)  Fish Oil .... Once Daily 5)  Multivitamins   Tabs (Multiple Vitamin) .Marland Kitchen.. 1 By Mouth Once Daily 6)  Vitamin D3 1000 Unit Tabs (Cholecalciferol) .... Once Daily 7)  Super B-100  Tabs (B Complex Vitamins) .... Once Daily 8)  Wal-Mucil 100 % Powd (Psyllium) .... Take One Tablespoon Daily 9)  Folic Acid 800 Mcg Tabs (Folic Acid) .Marland Kitchen.. 1 By Mouth Qd 10)  Hylauromic 50mg  .... 1 By Mouth Qod 11)  Warfarin Sodium 5 Mg Tabs (Warfarin Sodium) .... Take 1 By Mouth Once Daily  Allergies (verified): 1)  ! Niacin  Anticoagulation Management History:      Positive risk factors for bleeding include an age of 29 years or older.  The bleeding index is 'intermediate risk'.  Negative CHADS2 values include Age > 21 years old.  The start date was 11/11/2007.  His last INR was 1.73 and today's INR is 2.2.  Anticoagulation responsible provider: Graciela Husbands MD, Viviann Spare.    Anticoagulation Management Assessment/Plan:      The patient's current anticoagulation dose is Warfarin sodium 5 mg tabs: Take 1 by mouth once daily.  The target INR is 2 - 3.  The next INR is due 2 week.  Anticoagulation instructions were given to patient.  Results were reviewed/authorized by Inactive.         Prior Anticoagulation Instructions: INR 1.7  Continue 2 tablets today and tomorrow.  Continue Lovenox injections. Recheck INR in 2 days with Dr. Laury Axon    VITAL SIGNS:  Patient  Profile:   76 Years Old Male CC:      PT/INR check Height:     67 inches Weight:      188 pounds BP sitting:   118 / 62  (left arm) Temp:     98.2 degrees F.   Laboratory Results   Blood Tests      INR: 2.2   (Normal Range: 0.88-1.12   Therap INR: 2.0-3.5)      ANTICOAGULATION RECORD PREVIOUS REGIMEN & LAB RESULTS Anticoagulation Diagnosis:  Deep venous thrombosis on  03/01/2010 Previous INR Goal Range:  2-3 on  03/01/2010 Previous INR:  1.73 on  03/01/2010 Previous Coumadin Dose(mg):  5 mg once daily on  03/01/2010 Previous Regimen:  take an extra 1/2 of tablet today and continue with 5mg  once daily on  03/01/2010  NEW REGIMEN & LAB RESULTS Current INR: 2.2 Current Coumadin Dose(mg): (5mg ) 1 tab daily Regimen: same  Repeat testing in: 2 week  Anticoagulation Visit Questionnaire Coumadin dose missed/changed:  No Abnormal Bleeding Symptoms:  No  Any diet changes including alcohol intake, vegetables or greens since the last visit:  No Any illnesses or hospitalizations since the last visit:  No Any signs of clotting since the last visit (including chest discomfort, dizziness, shortness of breath, arm tingling, slurred speech, swelling or redness in leg):  No  MEDICATIONS RED YEAST RICE 600 MG  CAPS (RED YEAST RICE EXTRACT) 1 once daily COQ10 100 MG  CAPS (COENZYME Q10) 1 once daily SAW PALMETTO 160 MG  CAPS (SAW PALMETTO (SERENOA REPENS)) 1 once daily * FISH OIL once daily MULTIVITAMINS   TABS (MULTIPLE VITAMIN) 1 by mouth once daily VITAMIN D3 1000 UNIT TABS (CHOLECALCIFEROL) once daily SUPER B-100  TABS (B COMPLEX VITAMINS) once daily WAL-MUCIL 100 % POWD (PSYLLIUM) Take one tablespoon daily FOLIC ACID 800 MCG TABS (FOLIC ACID) 1 by mouth qd * HYLAUROMIC 50MG  1 by mouth qod WARFARIN SODIUM 5 MG TABS (WARFARIN SODIUM) Take 1 by mouth once daily     Appended Document: Orders Update   per patient- he went to GI and he doesn not need a colonoscopy until  2016.Marland Kitchen... Almeta Monas CMA Duncan Dull)  March 22, 2010 3:18 PM   Clinical Lists Changes  Orders: Added new Referral order of Cardiology Referral (Cardiology) - Signed

## 2010-03-22 ENCOUNTER — Encounter: Payer: Self-pay | Admitting: Internal Medicine

## 2010-03-22 ENCOUNTER — Ambulatory Visit (INDEPENDENT_AMBULATORY_CARE_PROVIDER_SITE_OTHER): Payer: Medicare Other | Admitting: Internal Medicine

## 2010-03-22 ENCOUNTER — Telehealth (INDEPENDENT_AMBULATORY_CARE_PROVIDER_SITE_OTHER): Payer: Self-pay | Admitting: *Deleted

## 2010-03-22 DIAGNOSIS — Z860100 Personal history of colon polyps, unspecified: Secondary | ICD-10-CM | POA: Insufficient documentation

## 2010-03-22 DIAGNOSIS — D689 Coagulation defect, unspecified: Secondary | ICD-10-CM

## 2010-03-23 ENCOUNTER — Encounter: Payer: Self-pay | Admitting: Family Medicine

## 2010-03-23 ENCOUNTER — Ambulatory Visit (INDEPENDENT_AMBULATORY_CARE_PROVIDER_SITE_OTHER): Payer: Medicare Other

## 2010-03-23 DIAGNOSIS — I2699 Other pulmonary embolism without acute cor pulmonale: Secondary | ICD-10-CM

## 2010-03-23 DIAGNOSIS — Z7901 Long term (current) use of anticoagulants: Secondary | ICD-10-CM

## 2010-03-23 DIAGNOSIS — I82409 Acute embolism and thrombosis of unspecified deep veins of unspecified lower extremity: Secondary | ICD-10-CM

## 2010-03-23 LAB — CONVERTED CEMR LAB: INR: 1.9

## 2010-03-23 NOTE — Procedures (Signed)
Summary: COLON   Colonoscopy  Procedure date:  12/05/2004  Findings:      Location:  [facility].    Procedures Next Due Date:    Colonoscopy: 12/2009 Patient Name: Shawn [name], Shawn [name] MRN:  Procedure Procedures: Colonoscopy CPT: 95638.  Personnel: Endoscopist: Ulyess Mort, MD.  Exam Location: Exam performed in Outpatient Clinic. Outpatient  Patient Consent: Procedure, Alternatives, Risks and Benefits discussed, consent obtained, from patient. Consent was obtained by the RN.  Indications  Surveillance of: Adenomatous Polyp(s).  History  Current Medications: Patient is not currently taking Coumadin.  Pre-Exam Physical: Performed Nov 27, 2001. Rectal exam, Abdominal exam, Extremity exam, Mental status exam WNL.  Exam Exam: Extent of exam reached: Cecum, extent intended: Cecum.  The cecum was identified by appendiceal orifice and IC valve. Colon retroflexion performed. Images taken. ASA Classification: II. Tolerance: excellent.  Monitoring: Pulse and BP monitoring, Oximetry used. Supplemental O2 given.  Colon Prep Prep results: good.  Sedation Meds: Patient assessed and found to be appropriate for moderate (conscious) sedation. Fentanyl given IV. Versed given IV.  Findings POLYP: Ascending Colon, Maximum size: 4 mm. sessile polyp. Procedure:  hot biopsy, removed, retrieved, Polyp sent to pathology. ICD9: Colon Polyps: 211.3.  OTHER FINDING: erosion found in Transverse Colon. Biopsy/Other Finding taken. Comments: erosion.  - DIVERTICULOSIS: Descending Colon. ICD9: Diverticulosis: 562.10. Comments: minnimal.  - HEMORRHOIDS: External. Size: Grade II. ICD9: Hemorrhoids, External: 455.3.   Assessment Abnormal examination, see findings above.  Diagnoses: 211.3: Colon Polyps.  562.10: Diverticulosis.  455.3: Hemorrhoids, External.   Events  Unplanned Interventions: No intervention was required.  Unplanned Events: There were no  complications. Plans Medication Plan: Await pathology. Continue current medications.  Patient Education: Patient given standard instructions for: Polyps. Diverticulosis. Hemorrhoids. Yearly hemoccult testing recommended. Patient instructed to get routine colonoscopy every 5 years.  Disposition: After procedure patient sent to recovery. After recovery patient sent home.   This report was created from the original endoscopy report, which was reviewed and signed by the above listed endoscopist.

## 2010-03-23 NOTE — Procedures (Signed)
Summary: COLON   Colonoscopy  Procedure date:  11/27/2001  Findings:      Location:  [facility].    Procedures Next Due Date:    Colonoscopy: 12/2004 Patient Name: Shawn [name], Shawn [name] MRN:  Procedure Procedures: Colonoscopy CPT: 60454.    with Hot Biopsy(s)CPT: Z451292.    with polypectomy. CPT: A3573898.  Personnel: Endoscopist: Ulyess Mort, MD.  Referred By: Angelena Sole, MD.  Exam Location: Exam performed in Outpatient Clinic. Outpatient  Patient Consent: Procedure, Alternatives, Risks and Benefits discussed, consent obtained, from patient. Consent was obtained by the RN.  Indications  Average Risk Screening Routine.  History  Pre-Exam Physical: Performed Nov 27, 2001. Rectal exam, Abdominal exam, Extremity exam, Mental status exam WNL.  Exam Exam: Extent of exam reached: Ileum, extent intended: Ileum.  The cecum was identified by appendiceal orifice and IC valve. Colon retroflexion performed. Images were not taken. ASA Classification: II. Tolerance: good.  Monitoring: Pulse and BP monitoring, Oximetry used. Supplemental O2 given.  Colon Prep Prep results: good.  Sedation Meds: Patient assessed and found to be appropriate for moderate (conscious) sedation. Fentanyl 50 mcg. given IV. Versed 5 mg. given IV.  Findings POLYP: Ascending Colon, Maximum size: 6 mm. sessile polyp. Procedure:  hot biopsy, removed, retrieved, Polyp sent to pathology. ICD9: Colon Polyps: 211.3.  POLYP: Sigmoid Colon, Maximum size: 13 mm. pedunculated polyp. Procedure:  snare with cautery, removed, retrieved, sent to pathology. ICD9: Colon Polyps: 211.3.  - HEMORRHOIDS: Internal and External. Size: Grade I. ICD9: Hemorrhoids, Internal and  External: 455.6.   Assessment Abnormal examination, see findings above.  Diagnoses: 211.3: Colon Polyps.  455.6: Hemorrhoids, Internal and  External.   Events  Unplanned Interventions: No intervention was required.    Unplanned Events: There were no complications. Plans Medication Plan: Continue current medications.  Patient Education: Patient given standard instructions for: Polyps. Hemorrhoids. Yearly hemoccult testing recommended. Patient instructed to get routine colonoscopy every 3 years.  Disposition: After procedure patient sent to recovery. After recovery patient sent home.   This report was created from the original endoscopy report, which was reviewed and signed by the above listed endoscopist.

## 2010-03-29 ENCOUNTER — Telehealth: Payer: Self-pay | Admitting: Family Medicine

## 2010-03-29 NOTE — Assessment & Plan Note (Addendum)
Summary: colon consult   History of Present Illness Visit Type: Follow-up Visit Primary GI MD: Lina Sar MD Primary Provider: Loreen Freud, DO  Requesting Provider: Rollene Rotunda, MD Chief Complaint: Consult colon. Pt denies any other GI complaints  History of Present Illness:   This is a 76 year old, white male who is here to discuss having a surveillance colonoscopy. He has a history of recent pulmonary infarction and pulmonary embolus in October 2011. He has mild aortic stenosis and factor V Leiden deficiency. He is on long-term Coumadin; probably indefinitely. In 2003, he underwent a colonoscopy by Dr.[place] with findings of adenomatous polyps. He also had internal hemorrhoids. A repeat colonoscopy in October 2006 showed a hyperplastic polyp of the ascending colon  4 mm in diameter. Again, he had internal hemorrhoids. He was evaluated for a hepatic cyst with abdominal ultrasound in August 2009 and again in July 2010. At that time, the hepatic cyst seemed to be stable. He denies any family history of colon cancer. There has been no rectal bleeding or abdominal pain.   GI Review of Systems      Denies abdominal pain, acid reflux, belching, bloating, chest pain, dysphagia with liquids, dysphagia with solids, heartburn, loss of appetite, nausea, vomiting, vomiting blood, weight loss, and  weight gain.        Denies anal fissure, black tarry stools, change in bowel habit, constipation, diarrhea, diverticulosis, fecal incontinence, heme positive stool, hemorrhoids, irritable bowel syndrome, jaundice, light color stool, liver problems, rectal bleeding, and  rectal pain.    Current Medications (verified): 1)  Red Yeast Rice 600 Mg  Caps (Red Yeast Rice Extract) .Marland Kitchen.. 1 Once Daily 2)  Coq10 100 Mg  Caps (Coenzyme Q10) .Marland Kitchen.. 1 Once Daily 3)  Saw Palmetto 160 Mg  Caps (Saw Palmetto (Serenoa Repens)) .Marland Kitchen.. 1 Once Daily 4)  Fish Oil .... Once Daily 5)  Multivitamins   Tabs (Multiple Vitamin) .Marland Kitchen.. 1  By Mouth Once Daily 6)  Vitamin D3 1000 Unit Tabs (Cholecalciferol) .... Once Daily 7)  Super B-100  Tabs (B Complex Vitamins) .... Once Daily 8)  Wal-Mucil 100 % Powd (Psyllium) .... Take One Tablespoon Daily 9)  Folic Acid 800 Mcg Tabs (Folic Acid) .Marland Kitchen.. 1 By Mouth Qd 10)  Hylauromic 50mg  .... 1 By Mouth Qod 11)  Warfarin Sodium 5 Mg Tabs (Warfarin Sodium) .... Take 1 By Mouth Once Daily  Allergies (verified): 1)  ! Niacin  Past History:  Past Medical History: DYSPHAGIA, UNSPECIFIED (ICD-787.20) HYPERLIPIDEMIA (ICD-272.4) CARDIAC MURMUR (ICD-785.2) (mild aortic sclerosis) OSTEOARTHRITIS (ICD-715.90) Blood clots  Past Surgical History: Reviewed history from 10/01/2008 and no changes required. Appendectomy Inguinal herniorrhaphy Tonsillectomy Vasectomy Vein stripping r leg Cataract extraction (02/19/2007) R eye  Family History: Family History of Arthritis Family History Hypertension Family History Lung cancer  M- MI at 55 Family History of Heart Disease: Mother No FH of Colon Cancer:  Social History: Reviewed history from 10/01/2008 and no changes required. 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  The patient denies allergy/sinus, anemia, anxiety-new, blood in urine, breast changes/lumps, change in vision, confusion, cough, coughing up blood, depression-new, fainting, fatigue, fever, headaches-new, hearing problems, heart murmur, heart rhythm changes, itching, menstrual pain, muscle pains/cramps, night sweats, nosebleeds, pregnancy symptoms, shortness of breath, skin rash, sleeping problems, sore throat, swelling of feet/legs, swollen lymph glands, thirst - excessive , urination - excessive ,  urination changes/pain, urine leakage, vision changes, and voice change.         Pertinent positive and negative review of systems were noted  in the above HPI. All other ROS was otherwise negative.   Vital Signs:  Patient profile:   76 year old male Height:      67 inches Weight:      186 pounds BMI:     29.24 BSA:     1.96 Pulse rate:   84 / minute Pulse rhythm:   regular BP sitting:   132 / 76  (left arm) Cuff size:   regular  Vitals Entered By: Ok Anis CMA (March 22, 2010 11:13 AM)  Physical Exam  General:  Well developed, well nourished, no acute distress. Eyes:  PERRLA, no icterus. Mouth:  No deformity or lesions, dentition normal. Neck:  Supple; no masses or thyromegaly. Lungs:  Clear throughout to auscultation. Heart:  Regular rate and rhythm; no murmurs, rubs,  or bruits. Abdomen:  Soft, nontender and nondistended. No masses, hepatosplenomegaly or hernias noted. Normal bowel sounds. Rectal:  normal prostate, soft Hemoccult negative stool. Extremities:  No clubbing, cyanosis, edema or deformities noted. Skin:  Intact without significant lesions or rashes. Psych:  Alert and cooperative. Normal mood and affect.   Impression & Recommendations:  Problem # 1:  PULMONARY EMBOLISM, HX OF (ICD-V12.51) on long-term Coumadin.  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  Problem # 3:  COLONIC POLYPS, ADENOMATOUS, HX OF (ICD-V12.72) Patient has a history of adenomatous and hyperplastic polyps. His last colonoscopy was in 2006. According to the current guidelines, he does not need a colonoscopy until 03/2014 which would be a 10 year interval. He is at high risk for complications because he is anticoagulated and has coagulopathy which could increase the risk for an embolic event. For that reason, we will wait for 10 years from the last colonoscopy to repeat it. He is Hemoccult negative and has no GI symptoms. If he develops an active GI bleed, we will reconsider.  Patient Instructions: 1)  hold off on colonoscopy until October 2016. Return on a p.r.n. basis 2)  Copy sent to : Dr Loann Quill

## 2010-03-29 NOTE — Progress Notes (Signed)
Summary: Lovenox bridge for colonoscopy  Phone Note From Other Clinic   Caller: Rachel-Dr Lowne's office Call For: Coumadin Clinic Summary of Call: Pt is going to have a colonoscopy, going for consultation today.  Pt's Coumadin is followed at [facility].  Per Dr Laury Axon pt will need Lovenox bridge while off Coumadin for colonoscopy.  Fleet Contras will call us back with date for colonoscopy for [place] to bridge pt.  Initial call taken by: Cloyde Reams RN,  March 22, 2010 9:51 AM  Follow-up for Phone Call        Dr Juanda Chance has decided to hold off on colonoscopy until 2016---- so we will not need the lovenox bridge.  Thanks Follow-up by: Loreen Freud DO,  March 22, 2010 8:04 PM

## 2010-03-29 NOTE — Assessment & Plan Note (Signed)
Summary: pt/cbs  Nurse Visit   Vital Signs:  Patient profile:   76 year old male Height:      67 inches Weight:      187 pounds Temp:     97.7 degrees F oral Pulse rate:   76 / minute BP sitting:   110 / 76  (left arm)  Vitals Entered By: Jeremy Johann CMA (March 23, 2010 9:47 AM) CC: PT/INR   Current Medications (verified): 1)  Red Yeast Rice 600 Mg  Caps (Red Yeast Rice Extract) .Marland Kitchen.. 1 Once Daily 2)  Coq10 100 Mg  Caps (Coenzyme Q10) .Marland Kitchen.. 1 Once Daily 3)  Saw Palmetto 160 Mg  Caps (Saw Palmetto (Serenoa Repens)) .Marland Kitchen.. 1 Once Daily 4)  Fish Oil .... Once Daily 5)  Multivitamins   Tabs (Multiple Vitamin) .Marland Kitchen.. 1 By Mouth Once Daily 6)  Vitamin D3 1000 Unit Tabs (Cholecalciferol) .... Once Daily 7)  Super B-100  Tabs (B Complex Vitamins) .... Once Daily 8)  Wal-Mucil 100 % Powd (Psyllium) .... Take One Tablespoon Daily 9)  Folic Acid 800 Mcg Tabs (Folic Acid) .Marland Kitchen.. 1 By Mouth Qd 10)  Hylauromic 50mg  .... 1 By Mouth Qod 11)  Warfarin Sodium 5 Mg Tabs (Warfarin Sodium) .... Take 1 By Mouth Once Daily  Allergies (verified): 1)  ! Niacin Laboratory Results   Blood Tests      INR: 1.9   (Normal Range: 0.88-1.12   Therap INR: 2.0-3.5)    Orders Added: 1)  Est. Patient Level I [17510] 2)  Protime [25852DP]  Patient Instructions: 1)  Please schedule a follow-up appointment in 2 weeks.   2)  Take 1 1/2 tab today then 1 1/2 tab every Fri (starting next week) and (5mg ) 1 tab daily all other days      ANTICOAGULATION RECORD PREVIOUS REGIMEN & LAB RESULTS Anticoagulation Diagnosis:  Deep venous thrombosis on  03/01/2010 Previous INR Goal Range:  2-3 on  03/01/2010 Previous INR:  2.2 on  03/08/2010 Previous Coumadin Dose(mg):  (5mg ) 1 tab daily on  03/08/2010 Previous Regimen:  same on  03/08/2010  NEW REGIMEN & LAB RESULTS Current INR: 1.9 Current Coumadin Dose(mg): (5mg ) 1 tab daily Regimen: 1 1/2 tab today then 1 1/2 tab every Fri (starting next week) and (5mg )  1 tab daily all other days   Provider: Laury Axon Repeat testing in: 2 weeks  Anticoagulation Visit Questionnaire Coumadin dose missed/changed:  No Abnormal Bleeding Symptoms:  No  Any diet changes including alcohol intake, vegetables or greens since the last visit:  No Any illnesses or hospitalizations since the last visit:  No Any signs of clotting since the last visit (including chest discomfort, dizziness, shortness of breath, arm tingling, slurred speech, swelling or redness in leg):  No  MEDICATIONS RED YEAST RICE 600 MG  CAPS (RED YEAST RICE EXTRACT) 1 once daily COQ10 100 MG  CAPS (COENZYME Q10) 1 once daily SAW PALMETTO 160 MG  CAPS (SAW PALMETTO (SERENOA REPENS)) 1 once daily * FISH OIL once daily MULTIVITAMINS   TABS (MULTIPLE VITAMIN) 1 by mouth once daily VITAMIN D3 1000 UNIT TABS (CHOLECALCIFEROL) once daily SUPER B-100  TABS (B COMPLEX VITAMINS) once daily WAL-MUCIL 100 % POWD (PSYLLIUM) Take one tablespoon daily FOLIC ACID 800 MCG TABS (FOLIC ACID) 1 by mouth qd * HYLAUROMIC 50MG  1 by mouth qod WARFARIN SODIUM 5 MG TABS (WARFARIN SODIUM) Take 1 by mouth once daily

## 2010-04-04 NOTE — Progress Notes (Signed)
Summary: [facility] CARDIO REFERRAL  ---- Converted from flag ---- ---- 03/29/2010 10:17 AM, Loreen Freud DO wrote: yes-- they decided not to do colon  ---- 03/29/2010 10:13 AM, Magdalen Spatz [facility] wrote: Dr. Laury Axon,  I see that the Cardiology referral for Lovenox Bridge prior to colonoscopy was cancelled.  I'm unable to see who cancelled it, is this correct?  Renee ------------------------------

## 2010-04-06 ENCOUNTER — Encounter: Payer: Self-pay | Admitting: Family Medicine

## 2010-04-06 ENCOUNTER — Ambulatory Visit (INDEPENDENT_AMBULATORY_CARE_PROVIDER_SITE_OTHER): Payer: Medicare Other

## 2010-04-06 DIAGNOSIS — Z7901 Long term (current) use of anticoagulants: Secondary | ICD-10-CM

## 2010-04-06 LAB — CONVERTED CEMR LAB: INR: 2.4

## 2010-04-13 NOTE — Medication Information (Signed)
Summary: pt/cbs  Anticoagulant Therapy  Managed by: Inactive Referring MD: Laury Axon PCP: Shawn Freud, DO  Supervising MD: Graciela Husbands MD, Viviann Spare Indication 1: Deep venous thrombosis Indication 2: Factor V Leiden Lab Used: [facility] INR RANGE 2-3  Vital Signs: Weight: 188 lbs.  Pulse Rate: 80  Blood Pressure:  120 / 76            Current Medications (verified): 1)  Red Yeast Rice 600 Mg  Caps (Red Yeast Rice Extract) .Marland Kitchen.. 1 Once Daily 2)  Coq10 100 Mg  Caps (Coenzyme Q10) .Marland Kitchen.. 1 Once Daily 3)  Saw Palmetto 160 Mg  Caps (Saw Palmetto (Serenoa Repens)) .Marland Kitchen.. 1 Once Daily 4)  Fish Oil .... Once Daily 5)  Multivitamins   Tabs (Multiple Vitamin) .Marland Kitchen.. 1 By Mouth Once Daily 6)  Vitamin D3 1000 Unit Tabs (Cholecalciferol) .... Once Daily 7)  Super B-100  Tabs (B Complex Vitamins) .... Once Daily 8)  Wal-Mucil 100 % Powd (Psyllium) .... Take One Tablespoon Daily 9)  Folic Acid 800 Mcg Tabs (Folic Acid) .Marland Kitchen.. 1 By Mouth Qd 10)  Hylauromic 50mg  .... 1 By Mouth Qod 11)  Warfarin Sodium 5 Mg Tabs (Warfarin Sodium) .... Take 1 By Mouth Once Daily 12)  Bee Pollen 1000 Mg Tabs (Bee Pollen) .... Take 1 Tab Once Daily  Allergies (verified): 1)  ! Niacin  Vital Signs:  Patient profile:   76 year old male Height:      67 inches Weight:      188 pounds Temp:     98.2 degrees F oral Pulse rate:   80 / minute BP sitting:   120 / 76  (left arm)  Vitals Entered By: Jeremy Johann CMA (April 06, 2010 9:57 AM)   Patient Instructions: 1)  Please schedule a follow-up appointment in 2 weeks.  2)  Continue current dose:(5MG ) 1 tab daily except 1 1/2 on Fri  CC: PT/INR   Anticoagulation Management History:      Positive risk factors for bleeding include an age of 50 years or older.  The bleeding index is 'intermediate risk'.  Negative CHADS2 values include Age > 86 years old.  The start date was 11/11/2007.  His last INR was 1.9 and today's INR is 2.4.  Anticoagulation responsible provider: Graciela Husbands MD,  Viviann Spare.    Anticoagulation Management Assessment/Plan:      The patient's current anticoagulation dose is Warfarin sodium 5 mg tabs: Take 1 by mouth once daily.  The target INR is 2 - 3.  The next INR is due 2 weeks.  Anticoagulation instructions were given to patient.  Results were reviewed/authorized by Inactive.         Prior Anticoagulation Instructions: INR 1.7  Continue 2 tablets today and tomorrow.  Continue Lovenox injections. Recheck INR in 2 days with Dr. Laury Axon  Laboratory Results   Blood Tests      INR: 2.4   (Normal Range: 0.88-1.12   Therap INR: 2.0-3.5)      ANTICOAGULATION RECORD PREVIOUS REGIMEN & LAB RESULTS Anticoagulation Diagnosis:  Deep venous thrombosis on  03/01/2010 Previous INR Goal Range:  2-3 on  03/01/2010 Previous INR:  1.9 on  03/23/2010 Previous Coumadin Dose(mg):  (5mg ) 1 tab daily on  03/23/2010 Previous Regimen:  1 1/2 tab today then 1 1/2 tab every Fri (starting next week) and (5mg ) 1 tab daily all other days  on  03/23/2010  NEW REGIMEN & LAB RESULTS Current INR: 2.4 Current Coumadin Dose(mg): (5MG ) 1 tab daily  except 1 1/2 on Fri Regimen: 1 1/2 tab today then 1 1/2 tab every Fri (starting next week) and (5mg ) 1 tab daily all other days   (no change)  Provider: lowne  Anticoagulation Visit Questionnaire Coumadin dose missed/changed:  No Abnormal Bleeding Symptoms:  No  Any diet changes including alcohol intake, vegetables or greens since the last visit:  No Any illnesses or hospitalizations since the last visit:  No Any signs of clotting since the last visit (including chest discomfort, dizziness, shortness of breath, arm tingling, slurred speech, swelling or redness in leg):  No  MEDICATIONS RED YEAST RICE 600 MG  CAPS (RED YEAST RICE EXTRACT) 1 once daily COQ10 100 MG  CAPS (COENZYME Q10) 1 once daily SAW PALMETTO 160 MG  CAPS (SAW PALMETTO (SERENOA REPENS)) 1 once daily * FISH OIL once daily MULTIVITAMINS   TABS (MULTIPLE  VITAMIN) 1 by mouth once daily VITAMIN D3 1000 UNIT TABS (CHOLECALCIFEROL) once daily SUPER B-100  TABS (B COMPLEX VITAMINS) once daily WAL-MUCIL 100 % POWD (PSYLLIUM) Take one tablespoon daily FOLIC ACID 800 MCG TABS (FOLIC ACID) 1 by mouth qd * HYLAUROMIC 50MG  1 by mouth qod WARFARIN SODIUM 5 MG TABS (WARFARIN SODIUM) Take 1 by mouth once daily BEE POLLEN 1000 MG TABS (BEE POLLEN) Take 1 tab once daily

## 2010-04-15 ENCOUNTER — Encounter: Payer: Self-pay | Admitting: Family Medicine

## 2010-04-19 LAB — CBC
HCT: 41.2 % (ref 39.0–52.0)
HCT: 41.3 % (ref 39.0–52.0)
Hemoglobin: 14.6 g/dL (ref 13.0–17.0)
MCH: 33.7 pg (ref 26.0–34.0)
MCH: 34 pg (ref 26.0–34.0)
MCH: 34.2 pg — ABNORMAL HIGH (ref 26.0–34.0)
MCH: 34.9 pg — ABNORMAL HIGH (ref 26.0–34.0)
MCHC: 35.1 g/dL (ref 30.0–36.0)
MCHC: 35.4 g/dL (ref 30.0–36.0)
MCV: 94.7 fL (ref 78.0–100.0)
MCV: 96.4 fL (ref 78.0–100.0)
MCV: 96.7 fL (ref 78.0–100.0)
Platelets: 101 10*3/uL — ABNORMAL LOW (ref 150–400)
Platelets: 120 10*3/uL — ABNORMAL LOW (ref 150–400)
RBC: 4.36 MIL/uL (ref 4.22–5.81)
RBC: 4.44 MIL/uL (ref 4.22–5.81)
RDW: 12.3 % (ref 11.5–15.5)
RDW: 12.4 % (ref 11.5–15.5)
WBC: 5.5 10*3/uL (ref 4.0–10.5)

## 2010-04-19 LAB — DIFFERENTIAL
Eosinophils Absolute: 0.1 10*3/uL (ref 0.0–0.7)
Eosinophils Relative: 2 % (ref 0–5)
Lymphs Abs: 1.3 10*3/uL (ref 0.7–4.0)

## 2010-04-19 LAB — POCT I-STAT, CHEM 8
Calcium, Ion: 1.13 mmol/L (ref 1.12–1.32)
Chloride: 104 mEq/L (ref 96–112)
Glucose, Bld: 110 mg/dL — ABNORMAL HIGH (ref 70–99)
HCT: 44 % (ref 39.0–52.0)
TCO2: 27 mmol/L (ref 0–100)

## 2010-04-19 LAB — BASIC METABOLIC PANEL
BUN: 16 mg/dL (ref 6–23)
CO2: 30 mEq/L (ref 19–32)
Chloride: 103 mEq/L (ref 96–112)
GFR calc Af Amer: >60 mL/min (ref >60)
Potassium: 4.1 mEq/L (ref 3.5–5.1)

## 2010-04-19 LAB — D-DIMER, QUANTITATIVE: D-Dimer, Quant: 1.01 ug/mL-FEU — ABNORMAL HIGH (ref 0.00–0.48)

## 2010-04-19 LAB — POCT CARDIAC MARKERS: Troponin i, poc: <0.05 ng/mL (ref 0.00–0.09)

## 2010-04-19 LAB — PROTIME-INR
INR: 1.01 (ref 0.00–1.49)
Prothrombin Time: 13.5 seconds (ref 11.6–15.2)

## 2010-04-20 ENCOUNTER — Encounter: Payer: Self-pay | Admitting: Family Medicine

## 2010-04-20 ENCOUNTER — Ambulatory Visit (INDEPENDENT_AMBULATORY_CARE_PROVIDER_SITE_OTHER): Payer: Medicare Other

## 2010-04-20 DIAGNOSIS — I2699 Other pulmonary embolism without acute cor pulmonale: Secondary | ICD-10-CM

## 2010-04-20 DIAGNOSIS — I82409 Acute embolism and thrombosis of unspecified deep veins of unspecified lower extremity: Secondary | ICD-10-CM

## 2010-04-20 DIAGNOSIS — Z7901 Long term (current) use of anticoagulants: Secondary | ICD-10-CM

## 2010-04-21 ENCOUNTER — Ambulatory Visit (INDEPENDENT_AMBULATORY_CARE_PROVIDER_SITE_OTHER): Payer: Medicare Other | Admitting: Family Medicine

## 2010-04-21 ENCOUNTER — Encounter: Payer: Self-pay | Admitting: Family Medicine

## 2010-04-21 DIAGNOSIS — M549 Dorsalgia, unspecified: Secondary | ICD-10-CM | POA: Insufficient documentation

## 2010-04-21 HISTORY — DX: Dorsalgia, unspecified: M54.9

## 2010-04-25 NOTE — Assessment & Plan Note (Signed)
Summary: back pain lower back/cbs   Vital Signs:  Patient profile:   76 year old male Weight:      184.0 pounds BMI:     28.92 Pulse rate:   76 / minute Pulse rhythm:   regular BP sitting:   114 / 62  (right arm) Cuff size:   regular  Vitals Entered By: Almeta Monas CMA Duncan Dull) (April 21, 2010 3:32 PM) CC: c/o back spasms since yesterday, Back Pain   History of Present Illness:       This is a 76 year old man who presents with Back Pain.  The symptoms began 2 days ago.  Pt was working on TV last night and had pain when he got up and it was worse this am.   .  The patient denies fever, chills, weakness, loss of sensation, fecal incontinence, urinary incontinence, urinary retention, dysuria, rest pain, inability to work, and inability to care for self.  The pain is located in the left low back.  The pain began at home, suddenly, and after straining.  The pain radiates to the left hip.  The pain is made worse by standing or walking, flexion, extension, and activity.  The pain is made better by inactivity.    Current Medications (verified): 1)  Red Yeast Rice 600 Mg  Caps (Red Yeast Rice Extract) .Marland Kitchen.. 1 Once Daily 2)  Coq10 100 Mg  Caps (Coenzyme Q10) .Marland Kitchen.. 1 Once Daily 3)  Saw Palmetto 160 Mg  Caps (Saw Palmetto (Serenoa Repens)) .Marland Kitchen.. 1 Once Daily 4)  Fish Oil .... Once Daily 5)  Multivitamins   Tabs (Multiple Vitamin) .Marland Kitchen.. 1 By Mouth Once Daily 6)  Vitamin D3 1000 Unit Tabs (Cholecalciferol) .... Once Daily 7)  Super B-100  Tabs (B Complex Vitamins) .... Once Daily 8)  Wal-Mucil 100 % Powd (Psyllium) .... Take One Tablespoon Daily 9)  Folic Acid 800 Mcg Tabs (Folic Acid) .Marland Kitchen.. 1 By Mouth Qd 10)  Hylauromic 50mg  .... 1 By Mouth Qod 11)  Warfarin Sodium 5 Mg Tabs (Warfarin Sodium) .... Take 1 By Mouth Once Daily 12)  Bee Pollen 1000 Mg Tabs (Bee Pollen) .... Take 1 Tab Once Daily 13)  Vicodin Es 7.5-750 Mg Tabs (Hydrocodone-Acetaminophen) .... 1/2 By Mouth Every 6 Hours As Needed 14)   Flexeril 10 Mg Tabs (Cyclobenzaprine Hcl) .Marland Kitchen.. 1 By Mouth Three Times A Day  Allergies (verified): 1)  ! Niacin  Physical Exam  General:  Well-developed,well-nourished,in no acute distress; alert,appropriate and cooperative throughout examination Msk:  normal ROM, no joint tenderness, no joint swelling, no joint warmth, no joint deformities, no joint instability, and no crepitation.   Extremities:  No clubbing, cyanosis, edema, or deformity noted with normal full range of motion of all joints.   Neurologic:  alert & oriented X3 and DTRs symmetrical and normal.  + weakness L hip flexors + able to bend forward and back but has pain. + SLR L  Psych:  Oriented X3 and normally interactive.     Impression & Recommendations:  Problem # 1:  BACK PAIN (ICD-724.5)  Discussed use of moist heat or ice, modified activities, medications, and stretching/strengthening exercises. Back care instructions given. To be seen in 2 weeks if no improvement; sooner if worsening of symptoms.   His updated medication list for this problem includes:    Vicodin Es 7.5-750 Mg Tabs (Hydrocodone-acetaminophen) .Marland Kitchen... 1/2 by mouth every 6 hours as needed    Flexeril 10 Mg Tabs (Cyclobenzaprine hcl) .Marland Kitchen... 1 by  mouth three times a day  Complete Medication List: 1)  Red Yeast Rice 600 Mg Caps (Red yeast rice extract) .Marland Kitchen.. 1 once daily 2)  Coq10 100 Mg Caps (Coenzyme q10) .Marland Kitchen.. 1 once daily 3)  Saw Palmetto 160 Mg Caps (Saw palmetto (serenoa repens)) .Marland Kitchen.. 1 once daily 4)  Fish Oil  .... Once daily 5)  Multivitamins Tabs (Multiple vitamin) .Marland Kitchen.. 1 by mouth once daily 6)  Vitamin D3 1000 Unit Tabs (Cholecalciferol) .... Once daily 7)  Super B-100 Tabs (B complex vitamins) .... Once daily 8)  Wal-mucil 100 % Powd (Psyllium) .... Take one tablespoon daily 9)  Folic Acid 800 Mcg Tabs (Folic acid) .Marland Kitchen.. 1 by mouth qd 10)  Hylauromic 50mg   .... 1 by mouth qod 11)  Warfarin Sodium 5 Mg Tabs (Warfarin sodium) .... Take 1 by mouth  once daily 12)  Bee Pollen 1000 Mg Tabs (Bee pollen) .... Take 1 tab once daily 13)  Vicodin Es 7.5-750 Mg Tabs (Hydrocodone-acetaminophen) .... 1/2 by mouth every 6 hours as needed 14)  Flexeril 10 Mg Tabs (Cyclobenzaprine hcl) .Marland Kitchen.. 1 by mouth three times a day  Patient Instructions: 1)  call monday if pain if no better and we will send for xray low back Prescriptions: FLEXERIL 10 MG TABS (CYCLOBENZAPRINE HCL) 1 by mouth three times a day  #30 x 0   Entered and Authorized by:   Loreen Freud DO   Signed by:   Loreen Freud DO on 04/21/2010   Method used:   Electronically to        [facility] (retail)       [phone]       [place], [place]  [address]       Ph: [phone]       Fax: [phone]   RxID:   [id number]    Orders Added: 1)  Est. Patient Level III [02725]

## 2010-04-25 NOTE — Medication Information (Signed)
Summary: pt/cbs  Anticoagulant Therapy  Managed by: Inactive Referring MD: Laury Axon PCP: Loreen Freud, DO  Supervising MD: Graciela Husbands MD, Viviann Spare Indication 1: Deep venous thrombosis Indication 2: Factor V Leiden Lab Used: [facility] INR RANGE 2-3  Vital Signs: Weight: 186 lbs.  Pulse Rate: 80  Blood Pressure:  122 / 72            Allergies: 1)  ! Niacin  Vital Signs:  Patient profile:   76 year old male Height:      67 inches Weight:      186 pounds Temp:     98.6 degrees F oral Pulse rate:   80 / minute BP sitting:   122 / 72  (left arm)  Vitals Entered By: Jeremy Johann CMA (April 20, 2010 11:11 AM)   Patient Instructions: 1)  Please schedule a follow-up appointment in 1 month.  2)  Continue current dose of 1 tab daily except 1 1/2 tab on Fri  CC: PT/INR   Anticoagulation Management History:      Positive risk factors for bleeding include an age of 15 years or older.  The bleeding index is 'intermediate risk'.  Negative CHADS2 values include Age > 41 years old.  The start date was 11/11/2007.  His last INR was 2.4 and today's INR is 2.5.  Anticoagulation responsible provider: Graciela Husbands MD, Viviann Spare.    Anticoagulation Management Assessment/Plan:      The patient's current anticoagulation dose is Warfarin sodium 5 mg tabs: Take 1 by mouth once daily.  The target INR is 2 - 3.  The next INR is due 4 week.  Anticoagulation instructions were given to patient.  Results were reviewed/authorized by Inactive.         Prior Anticoagulation Instructions: INR 1.7  Continue 2 tablets today and tomorrow.  Continue Lovenox injections. Recheck INR in 2 days with Dr. Laury Axon  Laboratory Results   Blood Tests      INR: 2.5   (Normal Range: 0.88-1.12   Therap INR: 2.0-3.5) Comments: Per Dr Beverely Low recheck 4 weeks.Marti Sleigh Deloach CMA  April 20, 2010 11:26 AM       ANTICOAGULATION RECORD PREVIOUS REGIMEN & LAB RESULTS Anticoagulation Diagnosis:  Deep venous thrombosis on   03/01/2010 Previous INR Goal Range:  2-3 on  03/01/2010 Previous INR:  2.4 on  04/06/2010 Previous Coumadin Dose(mg):  (5MG ) 1 tab daily except 1 1/2 on Fri on  04/06/2010 Previous Regimen:  1 1/2 tab today then 1 1/2 tab every Fri (starting next week) and (5mg ) 1 tab daily all other days  on  03/23/2010  NEW REGIMEN & LAB RESULTS Current INR: 2.5 Current Coumadin Dose(mg): Take 1 tab daily except  1 1/2 tab on fri Regimen: same  Provider: Laury Axon Repeat testing in: 4 week  Anticoagulation Visit Questionnaire Coumadin dose missed/changed:  No Abnormal Bleeding Symptoms:  No  Any diet changes including alcohol intake, vegetables or greens since the last visit:  No Any illnesses or hospitalizations since the last visit:  No Any signs of clotting since the last visit (including chest discomfort, dizziness, shortness of breath, arm tingling, slurred speech, swelling or redness in leg):  No  MEDICATIONS RED YEAST RICE 600 MG  CAPS (RED YEAST RICE EXTRACT) 1 once daily COQ10 100 MG  CAPS (COENZYME Q10) 1 once daily SAW PALMETTO 160 MG  CAPS (SAW PALMETTO (SERENOA REPENS)) 1 once daily * FISH OIL once daily MULTIVITAMINS   TABS (MULTIPLE VITAMIN) 1 by mouth once daily VITAMIN  D3 1000 UNIT TABS (CHOLECALCIFEROL) once daily SUPER B-100  TABS (B COMPLEX VITAMINS) once daily WAL-MUCIL 100 % POWD (PSYLLIUM) Take one tablespoon daily FOLIC ACID 800 MCG TABS (FOLIC ACID) 1 by mouth qd * HYLAUROMIC 50MG  1 by mouth qod WARFARIN SODIUM 5 MG TABS (WARFARIN SODIUM) Take 1 by mouth once daily BEE POLLEN 1000 MG TABS (BEE POLLEN) Take 1 tab once daily

## 2010-05-08 ENCOUNTER — Other Ambulatory Visit: Payer: Self-pay | Admitting: *Deleted

## 2010-05-08 NOTE — Telephone Encounter (Signed)
Pt left VM wanting to know if it would be possible to get a 90 day supply of warfarin. Try to call Pt no answer will try again later.

## 2010-05-09 MED ORDER — WARFARIN SODIUM 5 MG PO TABS: 5.0000 mg | ORAL_TABLET | ORAL | Status: DC

## 2010-05-09 NOTE — Telephone Encounter (Signed)
Left message to call office will send Rx to pharmacy need to confirm which pharmacy.

## 2010-05-09 NOTE — Telephone Encounter (Signed)
Pt aware Rx sent to pharmacy 

## 2010-05-22 ENCOUNTER — Ambulatory Visit (INDEPENDENT_AMBULATORY_CARE_PROVIDER_SITE_OTHER): Payer: Medicare Other | Admitting: *Deleted

## 2010-05-22 DIAGNOSIS — Z7901 Long term (current) use of anticoagulants: Secondary | ICD-10-CM

## 2010-05-22 DIAGNOSIS — I2699 Other pulmonary embolism without acute cor pulmonale: Secondary | ICD-10-CM

## 2010-05-22 LAB — POCT INR: INR: 2.8

## 2010-05-22 NOTE — Patient Instructions (Signed)
Return to office in 4 weeks  Take 1 tab daily except 1 1/2 tab on Fri

## 2010-06-20 NOTE — Assessment & Plan Note (Signed)
[facility]                            CARDIOLOGY OFFICE NOTE   NAME:Lorenson, QUINDARRIUS             MRN:          [id number]  DATE:09/12/2007                            DOB:          1934/05/01    REASON FOR PRESENTATION:  Evaluate the patient with mild aortic  stenosis.   HISTORY OF PRESENT ILLNESS:  The patient is a pleasant 76 year old  gentleman who looks much younger than his stated age.  He has a systolic  murmur in last year and was found to have some very mild aortic  stenosis.  There was some mild aortic insufficiency as well.  There is a  questionable bicuspid valve which is unlikely at his age.  Since I last  saw him, he has done quite well.  He has not had any new shortness of  breath.  He denies any PND or orthopnea.  He is active doing yard work.  He denies any chest pressure, neck or arm discomfort.  He has had no  palpitations, presyncope, or syncope.   PAST MEDICAL HISTORY:  1. Mild dyslipidemia.  2. Tonsillectomy.  3. Appendectomy.  4. Hernia repair.  5. Right leg vein stripping.   ALLERGIES/INTOLERANCES:  NIACIN.   MEDICATIONS:  Aspirin 81 mg daily (he stopped his pravastatin).  He is  taking red yeast rice.   REVIEW OF SYSTEMS:  As stated in the HPI and otherwise negative for  other systems.   PHYSICAL EXAMINATION:  GENERAL:  The patient is in no distress.  VITAL SIGNS:  Blood pressure 131/85.  HEENT:  Eyelids unremarkable, pupils equal round and reactive to light,  fundi not visualized, oral mucosa unremarkable.  NECK:  No jugular venous distention at 45 degrees, carotid upstroke  brisk and symmetrical, no bruits, no thyromegaly.  LYMPHATICS:  No adenopathy.  LUNGS:  Clear to auscultation bilaterally.  CHEST:  Unremarkable.  HEART:  PMI not displaced or sustained, S1 and S2 within normal limits,  no S3, no S4, no clicks, no rubs, 2/6 early peaking apical systolic  murmur radiating slightly out the aortic  outflow tract, no diastolic  murmurs.  ABDOMEN:  Flat, positive bowel sounds, normal in frequency and pitch, no  bruits, no rebound, no guarding, no midline pulsatile mass, no  hepatomegaly, no splenomegaly.  SKIN:  No rashes, no nodules.  EXTREMITIES:  Pulses 2+, no edema.   ASSESSMENT/PLAN:  1. Aortic stenosis.  The patient has some very mild aortic stenosis.      If that, this will come to be a clinical problem.  We will follow      it clinically.  2. Dyslipidemia.  We talked about this at length.  He has      predominantly a low HDL with a very low LDL.  His triglycerides is      slightly high.  He is trying red yeast rice.  He cannot tolerate      fish oil because he cannot swallow the capsules.  He is trying      flaxseed.  I think this is a reasonable approach.  I have      encouraged him to exercise more.  3. Liver cyst.  The patient had a liver cyst noted incidentally on      cardiac echo.  We will get a dedicated liver ultrasound to make      sure that that is stable.  4. Followup.  I will see him back in 1 year.     Rollene Rotunda, MD, [facility]  Electronically Signed    JH/MedQ  DD: 09/12/2007  DT: 09/13/2007  Job #: [id number]   cc:   Lelon Perla, DO

## 2010-06-20 NOTE — Assessment & Plan Note (Signed)
[facility]                            CARDIOLOGY OFFICE NOTE   NAME:Shawn [name], Shawn [name]             MRN:          [id number]  DATE:10/03/2006                            DOB:          1935-01-31    PRIMARY:  Dr. Laury Axon.   REASON FOR PRESENTATION:  Evaluate patient with systolic murmur and  abnormal echocardiogram.   HISTORY OF PRESENT ILLNESS:  Patient is a pleasant 76 year old gentleman  without prior cardiac history.  He has done very well in his 71 years.  He does not exercise routinely but he says he is active in his daily  living.  He denies any chest pain or shortness of breath.  He has no  palpitations, presyncope or syncope.  He has never had any PND or  orthopnea.  He did have an echocardiogram to evaluate a murmur recently.  He was found to have questionable functional bicuspid aortic valve with  a well preserved ejection fraction.  There was no significant valvular  gradient though.  He had trivial mitral regurgitation and a mildly  thickened mitral valve as well.   PAST MEDICAL HISTORY:  Mild dyslipidemia.   PAST SURGICAL HISTORY:  1. Tonsillectomy.  2. Appendectomy.  3. Hernia repair.  4. Right leg vein stripping.   ALLERGIES:  INTOLERANCE TO NIACIN.   MEDICATIONS:  1. Pravastatin 40 mg daily.  2. Aspirin 81 mg daily.  3. Multivitamin.  4. Coenzyme Q.  5. Calcium.  6. Saw palmetto.  7. Flax seed.   SOCIAL HISTORY:  The patient is retired from [employer], [employer].  He is married  and has 4 children.  He does try to walk for exercise.  He does not  smoke cigarettes.  He drinks wine weekly.   FAMILY HISTORY:  Noncontributory for early coronary disease.  His mother  died of heart disease but at age 46.   REVIEW OF SYSTEMS:  As stated in the HPI and positive for dentures,  joint pain in his fingers, an episodes of tingling on the left side of  his face recently but no other neurologic symptoms.  Negative for all  other  systems.   PHYSICAL EXAMINATION:  The patient is in no distress.  Blood pressure  121/80, heart rate 77 and regular, weight 173 pounds.  HEENT:  Eyelids unremarkable, pupils equally round and reactive to  light, fundi within normal limits, oral mucosa unremarkable.  NECK:  No jugular venous distention at 45 degrees, carotid upstroke  brisk and symmetrical, no bruits, no thyromegaly.  LYMPHATICS:  No cervical, axillary or inguinal adenopathy.  LUNGS:  Clear to auscultation bilaterally.  BACK:  No costovertebral angle tenderness.  CHEST:  Unremarkable.  HEART:  PMI not displaced or sustained, S1 and S2 within normal limits,  no S3, no S4, no clicks, no rubs, 2/6 early peaking apical systolic  murmur radiating slightly out the aortic outflow tract, no diastolic  murmurs.  ABDOMEN:  Flat, positive bowel sounds normal in frequency and pitch, no  bruits, no rebound, no guarding, no midline pulsatile mass, no  hepatomegaly, splenomegaly.  SKIN:  No rashes, no nodules.  EXTREMITIES:  With 2+ pulses throughout, no  edema, no cyanosis, no  clubbing.  NEURO:  Oriented to person, place, and time; cranial nerves II-XII  grossly intact, motor grossly intact.   EKG sinus rhythm, rate 82, axis within normal limits, intervals within  normal limits, no acute ST-T wave changes.   ASSESSMENT/PLAN:  1. Systolic murmur.  The patient does have a sytolic murmur with a      slightly abnormal aortic valve noted.  He may have slight      abnormality in the proportion of his valve cusps.  However, he does      not have any significant obstruction to outflow.  He has no      symptoms.  He has an otherwise normal physical exam.  I doubt that      this will lead to any significant problems in the near future.  I      think he can simply be followed with clinical exams.  We discussed      this dizzy at length.  2. Dyslipidemia.  He is having aggressive management per Dr. Laury Axon.      We discussed the  benefit/risks of statin.  He will proceed with      pravastatin as prescribed by Dr. Laury Axon.  3. Followup.  I would like to see the patient back in 1 year or sooner      if he has any complaints such as shortness of breath, chest      discomfort, dizziness.     Rollene Rotunda, MD, [facility]  Electronically Signed    JH/MedQ  DD: 10/03/2006  DT: 10/04/2006  Job #: [id number]   cc:   Lelon Perla, DO

## 2010-06-21 ENCOUNTER — Ambulatory Visit (INDEPENDENT_AMBULATORY_CARE_PROVIDER_SITE_OTHER): Payer: Medicare Other | Admitting: *Deleted

## 2010-06-21 DIAGNOSIS — D696 Thrombocytopenia, unspecified: Secondary | ICD-10-CM

## 2010-06-21 DIAGNOSIS — Z7901 Long term (current) use of anticoagulants: Secondary | ICD-10-CM

## 2010-06-21 DIAGNOSIS — I82409 Acute embolism and thrombosis of unspecified deep veins of unspecified lower extremity: Secondary | ICD-10-CM

## 2010-06-21 NOTE — Patient Instructions (Addendum)
Return to office in  4 Weeks   Continue Current dose: Take 1 tab daily except 1 1/2 tab on Fri

## 2010-06-23 NOTE — Op Note (Signed)
NAME:  Shawn [name], Shawn [name] NO.:  [id number]   MEDICAL RECORD NO.:  [id number]                   PATIENT TYPE:  OIB   LOCATION:  2869                                 FACILITY:  [facility]   PHYSICIAN:  Robert L. Dione Booze, M.D.               DATE OF BIRTH:  [birth year]   DATE OF PROCEDURE:  12/31/2001  DATE OF DISCHARGE:  12/30/2001                                 OPERATIVE REPORT   INDICATIONS AND JUSTIFICATION:  This gentleman was seen on September 23, 2001  and several times subsequently.  He had a full examination and could be  corrected to 20/20.  His pressures were normal.  The pupils motility,  conjunctivae, cornea, anterior chamber and dilated fundus exam show early  cataracts and a mild conjunctivitis.  Externally his eyelids cover the  eyelashes completely and the margin reflex distance is about 1.5 mm.  He can  feel the weight of the lids and this blocks his upper field of vision.  He  felt he would like to have some of the skin removed since he was having  difficulty with this.  Photographs were taken to document the condition and  visual field testing shows a significant difference in the visual field of  about 15-20 degrees or more when the skin is taped compared to when it is  not taped. He decided to have upper eyelid blepharoplasties for this reason.  Justification - we performed the procedure in outpatient settings as  routine.  Justification for overnight stay - none.   PREOPERATIVE DIAGNOSIS:  Severe blepharoptosis with visual impairment.   POSTOPERATIVE DIAGNOSIS:  Severe blepharoptosis with visual impairment.   SURGEON:  Robert L. Dione Booze, M.D.   ANESTHESIA:  1% Xylocaine.   OPERATION/PROCEDURE:  Upper eyelid optical blepharoplasties.   DESCRIPTION OF PROCEDURE:  The patient arrived in the minor surgery room and  was prepped and draped in the routine fashion.  One percent Xylocaine with  epinephrine was given as a frontal nerve block and the  skin to be removed  was carefully demarcated and then excised along with some underlying fatty  tissue.  Bleeding was controlled with cautery and pressure.  Each wound was  closed with a running 6-0 nylon suture and pressure patches were applied  after Polysporin ointment was used.  The patient then left the minor room  having done well.   FOLLOW UP:  The patient will be seen in my office in five or six days to  have the sutures removed.  He is to remove the patches in several hours.                                               Robert L. Dione Booze, M.D.    RLG/MEDQ  D:  12/31/2001  T:  12/31/2001  Job:  [id number]   cc:   Angelena Sole, M.D. [facility]

## 2010-07-21 ENCOUNTER — Ambulatory Visit (INDEPENDENT_AMBULATORY_CARE_PROVIDER_SITE_OTHER): Payer: Medicare Other | Admitting: *Deleted

## 2010-07-21 DIAGNOSIS — I2699 Other pulmonary embolism without acute cor pulmonale: Secondary | ICD-10-CM

## 2010-07-21 DIAGNOSIS — Z7901 Long term (current) use of anticoagulants: Secondary | ICD-10-CM

## 2010-07-21 LAB — POCT INR: INR: 2.8

## 2010-07-21 NOTE — Patient Instructions (Addendum)
Return to office in  4 weeks for PT/INR  Continue current dose: Take 1 tab daily except 1 1/2 tab on Fri.

## 2010-08-13 ENCOUNTER — Other Ambulatory Visit: Payer: Self-pay | Admitting: Family Medicine

## 2010-08-14 NOTE — Telephone Encounter (Signed)
Refill sent.

## 2010-08-21 ENCOUNTER — Ambulatory Visit (INDEPENDENT_AMBULATORY_CARE_PROVIDER_SITE_OTHER): Payer: PRIVATE HEALTH INSURANCE | Admitting: *Deleted

## 2010-08-21 DIAGNOSIS — I2699 Other pulmonary embolism without acute cor pulmonale: Secondary | ICD-10-CM

## 2010-08-21 DIAGNOSIS — Z7901 Long term (current) use of anticoagulants: Secondary | ICD-10-CM

## 2010-08-21 LAB — POCT INR: INR: 2.6

## 2010-08-21 NOTE — Patient Instructions (Signed)
Return to office in 4 weeks  Continue current dose: Take 1 tab daily except 1 1/2 tab on Fri

## 2010-09-01 ENCOUNTER — Encounter: Payer: Self-pay | Admitting: Cardiology

## 2010-09-08 ENCOUNTER — Encounter: Payer: Medicare Other | Admitting: Family Medicine

## 2010-09-11 ENCOUNTER — Encounter: Payer: Medicare Other | Admitting: Family Medicine

## 2010-09-18 ENCOUNTER — Ambulatory Visit (INDEPENDENT_AMBULATORY_CARE_PROVIDER_SITE_OTHER): Payer: PRIVATE HEALTH INSURANCE | Admitting: *Deleted

## 2010-09-18 DIAGNOSIS — Z7901 Long term (current) use of anticoagulants: Secondary | ICD-10-CM

## 2010-09-18 DIAGNOSIS — I2699 Other pulmonary embolism without acute cor pulmonale: Secondary | ICD-10-CM

## 2010-09-18 LAB — POCT INR: INR: 2.8

## 2010-09-18 NOTE — Patient Instructions (Addendum)
Return to office in 4 weeks for PT/INR   Continue current dose: 1 tab daily except 1 1/2 tab on Friday

## 2010-09-29 ENCOUNTER — Ambulatory Visit (INDEPENDENT_AMBULATORY_CARE_PROVIDER_SITE_OTHER): Payer: PRIVATE HEALTH INSURANCE | Admitting: Family Medicine

## 2010-09-29 ENCOUNTER — Encounter: Payer: Self-pay | Admitting: Family Medicine

## 2010-09-29 VITALS — BP 122/74 | HR 69 | Temp 97.5°F | Wt 182.4 lb

## 2010-09-29 DIAGNOSIS — L02219 Cutaneous abscess of trunk, unspecified: Secondary | ICD-10-CM

## 2010-09-29 DIAGNOSIS — L03319 Cellulitis of trunk, unspecified: Secondary | ICD-10-CM

## 2010-09-29 DIAGNOSIS — L0291 Cutaneous abscess, unspecified: Secondary | ICD-10-CM

## 2010-09-29 MED ORDER — CEPHALEXIN 500 MG PO CAPS: 500.0000 mg | ORAL_CAPSULE | Freq: Two times a day (BID) | ORAL | Status: AC

## 2010-09-29 NOTE — Progress Notes (Signed)
  Subjective:    Patient ID: Shawn [name], male    DOB: 08-14-1934, 76 y.o.   MRN: [id number]  HPI Pt here c/o cyst on mid low back that has been there for years but just recently got bigger and painful.  No other complaints.    Review of Systems As above    Objective:   Physical Exam  Constitutional: He appears well-developed and well-nourished. No distress.  Skin:             Assessment & Plan:  Abscess--- low back Start keflex and use warm compresses Refer to surgery secondary to length of time pt has had it

## 2010-09-29 NOTE — Patient Instructions (Signed)
Abscess/Boil   (Furuncle)   An abscess (boil or furuncle) is an infected area that contains a collection of pus.   SYMPTOMS   Signs and symptoms of an abscess include pain, tenderness, redness, or hardness. You may feel a moveable soft area under your skin. An abscess can occur anywhere in the body.   TREATMENT   An incision (cut by the caregiver) may have been made over your abscess so the pus could be drained out. Gauze may have been packed into the space or a drain may have been looped thru the abscess cavity (pocket). This provides a drain that will allow the cavity to heal from the inside outwards. The abscess may be painful for a few days, but should feel much better if it was drained. Your abscess, if seen early, may not have localized and may not have been drained. If not, another appointment may be required if it does not get better on its own or with medications.   HOME CARE INSTRUCTIONS   Only take over-the-counter or prescription medicines for pain, discomfort, or fever as directed by your caregiver.   Keep the skin and clothes clean around your abscess.   If the abscess was drained, you will need to use gauze dressing (“4x4”) to collect any draining pus. These dressing typically will need to be changed 3 or more times during the day.   The infection may spread by skin contact with others. Avoid skin contact as much as possible.   Good hygiene is very important including regular hand washing, cover any draining skin lesions, and don’t share personal care items.   If you participate in sports do not share athletic equipment, towels, whirlpools, or personal care items. Shower after every practice or tournament.   If a draining area cannot be adequately covered:   Do not participate in sports   Children should not participate in day care until the wound has healed or drainage stops.   If your caregiver has given you a follow-up appointment, it is very important to keep that appointment. Not keeping the  appointment could result in a much worse infection, chronic or permanent injury, pain, and disability. If there is any problem keeping the appointment, you must call back to this facility for assistance.   SEEK MEDICAL CARE IF:   You develop increased pain, swelling, redness, drainage, or bleeding in the wound site.   You develop signs of generalized infection including muscle aches, chills, fever, or a general ill feeling.   You or your child has an oral temperature above 100.4.   Your baby is older than 3 months with a rectal temperature of 100.5º F (38.1° C) or higher for more than 1 day.   See your caregiver as directed for a recheck or sooner if you develop any of the symptoms described above. Take antibiotics (medicine that kills germs) as directed if they were prescribed.   MAKE SURE YOU:   Understand these instructions.   Will watch your condition.   Will get help right away if you are not doing well or get worse.   Document Released: 11/01/2004 Document Re-Released: 07/12/2009   [employer]® Patient Information ©2011 [employer].

## 2010-10-05 ENCOUNTER — Ambulatory Visit (INDEPENDENT_AMBULATORY_CARE_PROVIDER_SITE_OTHER): Payer: PRIVATE HEALTH INSURANCE | Admitting: Cardiology

## 2010-10-05 ENCOUNTER — Encounter: Payer: Self-pay | Admitting: Cardiology

## 2010-10-05 VITALS — BP 135/83 | HR 68 | Ht 68.0 in | Wt 181.8 lb

## 2010-10-05 DIAGNOSIS — E663 Overweight: Secondary | ICD-10-CM

## 2010-10-05 DIAGNOSIS — I2699 Other pulmonary embolism without acute cor pulmonale: Secondary | ICD-10-CM

## 2010-10-05 DIAGNOSIS — I359 Nonrheumatic aortic valve disorder, unspecified: Secondary | ICD-10-CM

## 2010-10-05 DIAGNOSIS — I350 Nonrheumatic aortic (valve) stenosis: Secondary | ICD-10-CM

## 2010-10-05 HISTORY — DX: Overweight: E66.3

## 2010-10-05 NOTE — Patient Instructions (Addendum)
Your physician wants you to follow-up in: 24 months with Dr Hoyle Barr will receive a reminder letter in the mail two months in advance. If you don't receive a letter, please call our office to schedule the follow-up appointment.   Your physician recommends that you continue on your current medications as directed. Please refer to the Current Medication list given to you today.

## 2010-10-05 NOTE — Progress Notes (Signed)
HPI The patient returns for two year follow up.  Since I last saw him he has done well. The patient denies any new symptoms such as chest discomfort, neck or arm discomfort. There has been no new shortness of breath, PND or orthopnea. There have been no reported palpitations, presyncope or syncope.  He has been hospitalized with a PE in Oct.  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  He exercises routinely and he has no symptoms with this.  Allergies  Allergen Reactions  . Niacin     Current Outpatient Prescriptions  Medication Sig Dispense Refill  . B Complex-Biotin-FA (SUPER B-100) TABS Take 1 tablet by mouth daily.        . cephALEXin (KEFLEX) 500 MG capsule Take 1 capsule (500 mg total) by mouth 2 (two) times daily.  20 capsule  0  . Cholecalciferol ([employer] VITAMIN D3) 1000 UNITS capsule Take 1,000 Units by mouth daily.        . Coenzyme Q10 (COQ10) 100 MG CAPS Take 1 capsule by mouth daily.        . fish oil-omega-3 fatty acids 1000 MG capsule Take 1 g by mouth daily.        . folic acid (FOLVITE) 800 MCG tablet Take 400 mcg by mouth daily.        Marland Kitchen Hyaluronic Acid-Vitamin C (HYALURONIC ACID PO) Take 1 tablet by mouth daily.        . Multiple Vitamin (MULTIVITAMIN) tablet Take 1 tablet by mouth daily.        . Psyllium (WAL-MUCIL) 100 % POWD Take by mouth daily.        . Red Yeast Rice 600 MG CAPS Take 1 capsule by mouth daily.        . saw palmetto 160 MG capsule Take 160 mg by mouth daily.        Marland Kitchen warfarin (COUMADIN) 5 MG tablet TAKE ONE TABLET BY MOUTH AS DIRECTED  90 tablet  0    Past Medical History  Diagnosis Date  . Hyperlipidemia   . Dysphagia   . Cardiac murmur     mild aortic sclerosis  . Osteoarthritis   . History of blood clots     Past Surgical History  Procedure Date  . Appendectomy   . Vasectomy   . Tonsillectomy   . Inguinal hernia repair   . Cataract extraction 02/09/2007    right eye  . Varicose vein surgery     ROS:  As stated in the HPI and  negative for all other systems.  PHYSICAL EXAM BP 135/83  Pulse 68  Ht 5\' 8"  (1.727 m)  Wt 181 lb 12.8 oz (82.464 kg)  BMI 27.64 kg/m2 GENERAL:  Well appearing HEENT:  Pupils equal round and reactive, fundi not visualized, oral mucosa unremarkable NECK:  No jugular venous distention, waveform within normal limits, carotid upstroke brisk and symmetric, no bruits, no thyromegaly LYMPHATICS:  No cervical, inguinal adenopathy LUNGS:  Clear to auscultation bilaterally BACK:  No CVA tenderness CHEST:  Unremarkable HEART:  PMI not displaced or sustained,S1 and S2 within normal limits, no S3, no S4, no clicks, no rubs, apical systolic early peaking murmur ABD:  Flat, positive bowel sounds normal in frequency in pitch, no bruits, no rebound, no guarding, no midline pulsatile mass, no hepatomegaly, no splenomegaly EXT:  2 plus pulses throughout, no edema, no cyanosis no clubbing SKIN:  No rashes no nodules NEURO:  Cranial nerves II through XII grossly  intact, motor grossly intact throughout PSYCH:  Cognitively intact, oriented to person place and time  EKG:  Sinus rhythm, rate 68, axis within normal limits, intervals within normal limits, no acute ST-T wave changes.  ASSESSMENT AND PLAN

## 2010-10-05 NOTE — Assessment & Plan Note (Signed)
The patient understands the need to lose weight with diet and exercise. We have discussed specific strategies for this.  

## 2010-10-05 NOTE — Assessment & Plan Note (Signed)
He will be on long term anticoagulation with his his of Factor V deficiency and PE and DVT.

## 2010-10-05 NOTE — Assessment & Plan Note (Signed)
At this point I have no reason to suspect that his AS is more than mild or moderate.  No further testing is indicated.

## 2010-10-17 ENCOUNTER — Other Ambulatory Visit: Payer: Self-pay | Admitting: Family Medicine

## 2010-10-18 ENCOUNTER — Encounter: Payer: Self-pay | Admitting: Family Medicine

## 2010-10-18 ENCOUNTER — Ambulatory Visit (INDEPENDENT_AMBULATORY_CARE_PROVIDER_SITE_OTHER): Payer: PRIVATE HEALTH INSURANCE | Admitting: Family Medicine

## 2010-10-18 VITALS — BP 124/76 | HR 60 | Temp 97.5°F | Ht 67.0 in | Wt 182.0 lb

## 2010-10-18 DIAGNOSIS — Z0000 Encounter for general adult medical examination without abnormal findings: Secondary | ICD-10-CM

## 2010-10-18 DIAGNOSIS — Z7901 Long term (current) use of anticoagulants: Secondary | ICD-10-CM

## 2010-10-18 DIAGNOSIS — Z125 Encounter for screening for malignant neoplasm of prostate: Secondary | ICD-10-CM

## 2010-10-18 DIAGNOSIS — I10 Essential (primary) hypertension: Secondary | ICD-10-CM

## 2010-10-18 DIAGNOSIS — E785 Hyperlipidemia, unspecified: Secondary | ICD-10-CM

## 2010-10-18 LAB — LIPID PANEL: Total CHOL/HDL Ratio: 4

## 2010-10-18 LAB — CBC WITH DIFFERENTIAL/PLATELET
Basophils Absolute: 0 10*3/uL (ref 0.0–0.1)
Hemoglobin: 16.7 g/dL (ref 13.0–17.0)
Lymphocytes Relative: 44.7 % (ref 12.0–46.0)
Monocytes Relative: 11.1 % (ref 3.0–12.0)
Neutro Abs: 1.4 10*3/uL (ref 1.4–7.7)
RDW: 13 % (ref 11.5–14.6)

## 2010-10-18 LAB — HEPATIC FUNCTION PANEL
AST: 30 U/L (ref 0–37)
Albumin: 4.4 g/dL (ref 3.5–5.2)
Alkaline Phosphatase: 60 U/L (ref 39–117)
Total Bilirubin: 1.8 mg/dL — ABNORMAL HIGH (ref 0.3–1.2)

## 2010-10-18 LAB — BASIC METABOLIC PANEL
Calcium: 9.2 mg/dL (ref 8.4–10.5)
GFR: 84.48 mL/min (ref >60.00)
Glucose, Bld: 89 mg/dL (ref 70–99)
Sodium: 140 mEq/L (ref 135–145)

## 2010-10-18 LAB — POCT INR: INR: 2.6

## 2010-10-18 NOTE — Patient Instructions (Signed)
Preventative Care for Adults, Male   MAINTAIN REGULAR HEALTH EXAMS   A routine yearly physical is a good way to check in with your primary care provider about your health and preventive screening. It is also an opportunity to share updates about your health and any concerns you have and receive a thorough all-over exam.   If you smoke or chew tobacco, find out from your caregiver how to quit. It can literally save your life, no matter how long you have been a tobacco user. If you do not use tobacco, never begin.   Maintain a healthy diet and normal weight. Increased weight leads to problems with blood pressure and diabetes. Decrease saturated fat in the diet and increase regular exercise. Get information about proper diet from your caregiver if necessary. Eat a variety of foods, including fruit, vegetables, animal or vegetable protein, such as meat, fish, chicken, and eggs, or beans, lentils, tofu, and grains, such as rice.   High blood pressure causes heart and blood vessel problems. Fat leaves deposits in your arteries that can block them. This causes heart disease and vessel disease elsewhere in your body. Check your blood pressure regularly and keep it within normal limits. Men over age 50 or those who have a family history of high blood pressure should have it checked at least every year.   Aerobic exercise helps maintain good heart health. 30 minutes of moderate-intensity exercise is recommended. For example, a brisk walk that increases your heart rate and breathing. This walk should be done on most days of the week. Persistent high blood pressure should be treated with medicine if weight loss and exercise do not work.   For many men aged 20 and older, having a cholesterol test of the blood every 5 years is recommended. If your cholesterol is found to be borderline high, or if you have heart disease or certain other medical conditions, then you may need to have it monitored more frequently.   Avoid smoking,  drinking alcohol in excess (more than two drinks per day) or use of street drugs. Do not share needles with anyone. Ask for professional help if you need assistance or instructions on stopping the use of alcohol, cigarettes, and/or drugs.   Maintain normal blood lipids and cholesterol by minimizing your intake of saturated fat. Eat a well rounded diet otherwise, with plenty of fruit and vegetables. The National Institutes of Health encourage men to eat 5-9 servings of fruit and vegetables each day. Your caregiver can help you keep your risk of heart disease or stroke at a lower level.   Ask your caregiver if you are in need of earlier testing because of: a strong family history of heart disease, or you have signs of elevated testosterone (male sex hormone) levels. This can predispose you to earlier heart disease. Ask if you should have a stress test if your history suggests this. A stress test is a test done on a treadmill that looks for heart disease. This test can find disease prior to there being a problem.   Diabetes screening assesses your blood sugar level after a fasting once every 3 years after age 45 if previous tests were normal.   Most routine colon cancer screening begins at the age of 50. On a yearly basis, doctors may provide special easy to use take-home tests to check for hidden blood in the stool. Sigmoidoscopy or colonoscopy can detect the earliest forms of colon cancer and is life saving. These test use a   small camera at the end of a tube to directly examine the colon. Speak to your caregiver about this at age 50, when routine screening begins (and is repeated every 5 years unless early forms of pre-cancerous polyps or small growths are found).   At the age of 50 men usually start screening for prostate cancer every year. Screening may begin at a younger age for those with higher risk. Those at higher risk include African-Americans or having a family history of prostate cancer. There are two types  of tests for prostate cancer:   Prostate-specific antigen (PSA) testing. Recent studies raise questions about prostate cancer using PSA and you should discuss this with your caregiver.   Digital rectal exam (in which your doctor’s lubricated and gloved finger feels for enlargement of the prostate through the anus).   Practice safe sex. Use condoms. Condoms are used for birth control and to help reduce the spread of sexually transmitted infections (or STIs). Unsafe sex is having an unprotected physical relationship with someone who is bisexual, homosexual, uses intravenous street drugs, or going with someone who has sexual relations with high-risk groups. Practicing safe sex helps you avoid getting an STI. Some of the STIs are gonorrhea (the clap), chlamydia, syphilis, trichimonas, herpes, HPV (human papiloma virus) and HIV (human immunodeficiency virus) which causes AIDS. The herpes, HIV and HPV are viral illnesses that have no cure. These can result in disability, cancer and death.   It is not safe for someone who has AIDS or is HIV positive to have unprotected sex with someone else who is positive. The reason for this is the fact that there are many different strains of HIV. If you have a strain that is readily treated with medications and then suddenly introduce a strain from a partner that has no further treatment options, you may suddenly have a strain of HIV that is untreatable. Even if you are both positive for HIV, it is still necessary to practice safe sex.   Use sunscreen with a SPF (or skin protection factor) of 15 or greater. Apply sunscreen liberally and repeatedly throughout the day. Being outside in the sun when your shadow caused by the sun is shorter than you are, means you are being exposed to sun at greater intensity. Lighter skinned people are at a greater risk of skin cancer. Don’t forget to also wear sunglasses in order to protect your eyes from too much damaging sunlight. Damaging sunlight can  accelerate cataract formation.   Once a month do a whole body skin exam or review, using a mirror to look at your back. Notify your caregivers of changes in moles, especially if there are changes in shapes, colors, a size larger than a pencil eraser, an irregular border, or development of new moles.   Keep carbon monoxide and smoke detectors in your home functioning at all times. Change the batteries every 6 months or use a model that plugs into the wall.   Do a monthly exam of your testicles. Gently roll each testicle between your thumb and fingers, feeling for any abnormal lumps. The best time to do this is after a hot shower or bath when the tissues are looser. Notify your caregivers of any lumps, tenderness or changes in size or shape immediately.   Stay up to date with your tetanus shots and other required immunizations. You should have a booster for tetanus every 10 years. Be sure to get your flu shot every year, since 5%-20% of the [place] population   comes down with the flu. The flu vaccine changes each year, so being vaccinated once is not enough. Get your shot in the fall, before the flu season peaks. The table below lists important vaccines to get. Other vaccines to consider include:   Hepatitis A virus (to prevent a form of infection of the liver by a virus acquired from food), Varicella Zoster (a virus that causes shingles).   Meningococcal (against bacteria which cause a form of meningitis).   Brush your teeth twice a day with fluoride toothpaste, and floss once a day. Good oral hygiene prevents tooth decay and gum disease. The problems can be painful, unattractive, and can cause other health problems. Visit your dentist for a routine oral and dental check up and preventive care every 6-12 months.   The Body Mass Index or BMI is a way of measuring how much of your body is fat. Having a BMI above 27 increases the risk of heart disease, diabetes, hypertension, stroke and other problems related to obesity.  Your caregiver can help determine your BMI and based on it develop an exercise and dietary program to help you achieve or maintain this important measurement at a healthful level.   Wear seat belts whenever in a vehicle, whether a passenger or driver, and even for very short drives of a few minutes.   If you bicycle, wear a helmet at all times.   Below is a summary of the most important preventative healthcare services that adult males should seek on a regular basis throughout their lives:   Preventative Care for Adult Males    Preventative Service  Ages 19-39  Ages 40-64  Ages 65 and over    Schedule of medical visits  Every 5 years  Every 5 years     Schedule of dental visits  Every 6-12 months  Every 6-12 months     Health risk assessment and lifestyle counseling  Every 3-5 years  Every 3-5 years  Every 3-5 years    Blood pressure check**  Every 2 years  Every 2 years  Every 2 years    Total cholesterol check including HDL**  Every 5 years beginning at age 35  Every 5 years  Every 5 years through age 75, then optional.    Flexible sigmoidoscopy or colonoscopy**   Every 5 years beginning at age 50  Every 5 years through age 80, then optional.    Prostate screening   Every year beginning at age 50  Every Year    Testicular exam  Monthly  Monthly  Monthly    FOBT (fecal occult blood test)   Every year beginning at age 50  Every year until 80, then optional.    Skin self-exam  Monthly  Monthly  Monthly    Tetanus-diphtheria (Td) immunization  Every 10 years  Every 10 years  Every 10 years    Influenza immunization**  Every year  Every year  Every year    Pneumococcal immunization**  Optional  Optional  Every 5 years    Hepatitis B immunization**  Series of 3 immunizations   (if not done previously, usually given at 0, 1 to 2, and 4 to 6 months)  Check with your caregiver if vaccination not previously given.  Check with your caregiver if vaccination not previously given.    **Family history and personal history of  risk and conditions may change your physician's recommendations.    Document Released: 03/20/2001 Document Re-Released: 04/18/2009   [employer]® Patient Information ©  2011 [employer].

## 2010-10-18 NOTE — Progress Notes (Signed)
Subjective:    Shawn [name] is a 76 y.o. male who presents for Medicare Annual/Subsequent preventive examination.   Preventive Screening-Counseling & Management  Tobacco History  Smoking status  . Never Smoker   Smokeless tobacco  . Not on file    Problems Prior to Visit 1. Cyst on back-- pt was going to have surgery but with warm compresses it drained--surgery cancelled. Current Problems (verified) Patient Active Problem List  Diagnoses  . MOLE  . HYPERLIPIDEMIA  . FACTOR V DEFICIENCY  . THROMBOCYTOPENIA  . CERUMEN IMPACTION, RIGHT  . ROM  . PULMONARY EMBOLISM  . AORTIC STENOSIS, MILD  . DVT  . VARICOSE VEINS LOWER EXTREMITIES W/INFLAMMATION  . HEPATIC CYST  . OSTEOARTHRITIS  . CALF PAIN, LEFT  . MEMORY LOSS  . CARDIAC MURMUR  . DYSPHAGIA, UNSPECIFIED  . PULMONARY EMBOLISM, HX OF  . COLONIC POLYPS, ADENOMATOUS, HX OF  . BACK PAIN  . Overweight    Medications Prior to Visit Current Outpatient Prescriptions on File Prior to Visit  Medication Sig Dispense Refill  . B Complex-Biotin-FA (SUPER B-100) TABS Take 1 tablet by mouth daily.        . Cholecalciferol ([employer]S VITAMIN D3) 1000 UNITS capsule Take 1,000 Units by mouth daily.        . Coenzyme Q10 (COQ10) 100 MG CAPS Take 1 capsule by mouth daily.        . fish oil-omega-3 fatty acids 1000 MG capsule Take 1 g by mouth daily.        . folic acid (FOLVITE) 800 MCG tablet Take 400 mcg by mouth daily.        Marland Kitchen Hyaluronic Acid-Vitamin C (HYALURONIC ACID PO) Take 1 tablet by mouth daily.        . Multiple Vitamin (MULTIVITAMIN) tablet Take 1 tablet by mouth daily.        . Psyllium (WAL-MUCIL) 100 % POWD Take by mouth daily.        . Red Yeast Rice 600 MG CAPS Take 1 capsule by mouth daily.        . saw palmetto 160 MG capsule Take 160 mg by mouth daily.        Marland Kitchen warfarin (COUMADIN) 5 MG tablet TAKE ONE TABLET BY MOUTH AS DIRECTED  90 tablet  0    Current Medications (verified) Current Outpatient  Prescriptions  Medication Sig Dispense Refill  . B Complex-Biotin-FA (SUPER B-100) TABS Take 1 tablet by mouth daily.        . Cholecalciferol (CVS VITAMIN D3) 1000 UNITS capsule Take 1,000 Units by mouth daily.        . Coenzyme Q10 (COQ10) 100 MG CAPS Take 1 capsule by mouth daily.        . fish oil-omega-3 fatty acids 1000 MG capsule Take 1 g by mouth daily.        . folic acid (FOLVITE) 800 MCG tablet Take 400 mcg by mouth daily.        Marland Kitchen Hyaluronic Acid-Vitamin C (HYALURONIC ACID PO) Take 1 tablet by mouth daily.        . Multiple Vitamin (MULTIVITAMIN) tablet Take 1 tablet by mouth daily.        . Psyllium (WAL-MUCIL) 100 % POWD Take by mouth daily.        . Red Yeast Rice 600 MG CAPS Take 1 capsule by mouth daily.        . saw palmetto 160 MG capsule Take 160 mg by mouth daily.        Marland Kitchen  warfarin (COUMADIN) 5 MG tablet TAKE ONE TABLET BY MOUTH AS DIRECTED  90 tablet  0     Allergies (verified) Niacin   PAST HISTORY  Family History Family History  Problem Relation Age of Onset  . Lung cancer    . Heart attack Mother 73  . Heart disease Mother     chf  . Arthritis Mother   . Cancer Father     lung    Social History History  Substance Use Topics  . Smoking status: Never Smoker   . Smokeless tobacco: Not on file  . Alcohol Use: No    Are there smokers in your home (other than you)?  No  Risk Factors Current exercise habits: Home exercise routine includes walking 1/4 hrs per day.  Dietary issues discussed: n/a   Cardiac risk factors: advanced age (older than 74 for men, 1 for women), dyslipidemia, hypertension and male gender.  Depression Screen (Note: if answer to either of the following is "Yes", a more complete depression screening is indicated)   Q1: Over the past two weeks, have you felt down, depressed or hopeless? No  Q2: Over the past two weeks, have you felt little interest or pleasure in doing things? No  Have you lost interest or pleasure in daily  life? No  Do you often feel hopeless? No  Do you cry easily over simple problems? No  Activities of Daily Living In your present state of health, do you have any difficulty performing the following activities?:  Driving? No Managing money?  No Feeding yourself? No Getting from bed to chair? No Climbing a flight of stairs? No Preparing food and eating?: No Bathing or showering? No Getting dressed: No Getting to the toilet? No Using the toilet:No Moving around from place to place: No In the past year have you fallen or had a near fall?:No   Are you sexually active?  Yes  Do you have more than one partner?  No  Hearing Difficulties: No Do you often ask people to speak up or repeat themselves? No Do you experience ringing or noises in your ears? No Do you have difficulty understanding soft or whispered voices? No   Do you feel that you have a problem with memory? No  Do you often misplace items? No  Do you feel safe at home?  Yes  Cognitive Testing  Alert? Yes  Normal Appearance?Yes  Oriented to person? Yes  Place? Yes   Time? Yes  Recall of three objects?  Yes  Can perform simple calculations? Yes  Displays appropriate judgment?Yes  Can read the correct time from a watch face?Yes   Advanced Directives have been discussed with the patient? Yes   List the Names of Other Physician/Practitioners you currently use: 1.  optho-[facility] 2 dentist-- brishures 3 cardio-- [facility]ochrein 4. GI --brodie  Indicate any recent Medical Services you may have received from other than [facility] providers in the past year (date may be approximate).  Immunization History  Administered Date(s) Administered  . Influenza Whole 11/12/2007, 11/17/2008, 11/09/2009  . Pneumococcal Polysaccharide 09/04/2005  . Td 01/18/1999, 10/13/2008  . Zoster 11/27/2006    Screening Tests Health Maintenance  Topic Date Due  . Influenza Vaccine  11/06/2010  . Colonoscopy  03/19/2014  . Tetanus/tdap  10/14/2018  .  Pneumococcal Polysaccharide Vaccine Age 3 And Over  Completed  . Zostavax  Completed    All answers were reviewed with the patient and necessary referrals were made:  Loreen Freud, DO  10/18/2010   History reviewed: allergies, current medications, past family history, past medical history, past social history, past surgical history and problem list  Review of Systems  Review of Systems  Constitutional: Negative for activity change, appetite change and fatigue.  HENT: Negative for hearing loss, congestion, tinnitus and ear discharge.  dentist q19m Eyes: Negative for visual disturbance (see optho q2y -- vision corrected to 20/20 with glasses).  Respiratory: Negative for cough, chest tightness and shortness of breath.   Cardiovascular: Negative for chest pain, palpitations and leg swelling.  Gastrointestinal: Negative for abdominal pain, diarrhea, constipation and abdominal distention.  Genitourinary: Negative for urgency, frequency, decreased urine volume and difficulty urinating.  Musculoskeletal: Negative for back pain, arthralgias and gait problem.  Skin: Negative for color change, pallor and rash.  Neurological: Negative for dizziness, light-headedness, numbness and headaches.  Hematological: Negative for adenopathy. Does not bruise/bleed easily.  Psychiatric/Behavioral: Negative for suicidal ideas, confusion, sleep disturbance, self-injury, dysphoric mood, decreased concentration and agitation.  Pt is able to read and write and can do all ADLs No risk for falling No abuse/ violence in home      Objective:     Vision by Snellen chart: right ZOX:WRUE, left AVW:UJWJX Blood pressure 124/76, pulse 60, temperature 97.5 F (36.4 C), temperature source Oral, height 5\' 7"  (1.702 m), weight 182 lb (82.555 kg), SpO2 96.00%. Body mass index is 28.51 kg/(m^2).  BP 124/76  Pulse 60  Temp(Src) 97.5 F (36.4 C) (Oral)  Ht 5\' 7"  (1.702 m)  Wt 182 lb (82.555 kg)  BMI 28.51 kg/m2  SpO2  96%  General Appearance:    Alert, cooperative, no distress, appears stated age  Head:    Normocephalic, without obvious abnormality, atraumatic  Eyes:    PERRL, conjunctiva/corneas clear, EOM's intact, fundi    benign, both eyes       Ears:    Normal TM's and external ear canals, both ears  Nose:   Nares normal, septum midline, mucosa normal, no drainage    or sinus tenderness  Throat:   Lips, mucosa, and tongue normal; teeth and gums normal  Neck:   Supple, symmetrical, trachea midline, no adenopathy;       thyroid:  No enlargement/tenderness/nodules; no carotid   bruit or JVD  Back:     Symmetric, no curvature, ROM normal, no CVA tenderness  Lungs:     Clear to auscultation bilaterally, respirations unlabored  Chest wall:    No tenderness or deformity  Heart:    Regular rate and rhythm, S1 and S2 normal  + murmur  Abdomen:     Soft, non-tender, bowel sounds active all four quadrants,    no masses, no organomegaly  Genitalia:    Normal male without lesion, discharge or tenderness  Rectal:    Normal tone, + enlarged prostate, no masses or tenderness;   guaiac negative stool  Extremities:   Extremities normal, atraumatic, no cyanosis or edema  Pulses:   2+ and symmetric all extremities  Skin:   Skin color, texture, turgor normal, no rashes or lesions  Lymph nodes:   Cervical, supraclavicular, and axillary nodes normal  Neurologic:   CNII-XII intact. Normal strength, sensation and reflexes      throughout       Assessment:     Preventative--  htn Hyperlipidemia AS PE      Plan:    see AVS ghm utd rto for flu shot con't meds, check labs During the course of the visit the patient was  educated and counseled about appropriate screening and preventive services including:    Pneumococcal vaccine   Influenza vaccine  Td vaccine  Screening electrocardiogram  Prostate cancer screening  Colorectal cancer screening  Glaucoma screening  Advanced directives: has an  advanced directive - a copy HAS NOT been provided.  Diet review for nutrition referral? Yes ____  Not Indicated __x__   Patient Instructions (the written plan) was given to the patient.  Medicare Attestation I have personally reviewed: The patient's medical and social history Their use of alcohol, tobacco or illicit drugs Their current medications and supplements The patient's functional ability including ADLs,fall risks, home safety risks, cognitive, and hearing and visual impairment Diet and physical activities Evidence for depression or mood disorders  The patient's weight, height, BMI, and visual acuity have been recorded in the chart.  I have made referrals, counseling, and provided education to the patient based on review of the above and I have provided the patient with a written personalized care plan for preventive services.     Loreen Freud, DO   10/18/2010

## 2010-11-17 ENCOUNTER — Ambulatory Visit (INDEPENDENT_AMBULATORY_CARE_PROVIDER_SITE_OTHER): Payer: PRIVATE HEALTH INSURANCE | Admitting: *Deleted

## 2010-11-17 DIAGNOSIS — Z7901 Long term (current) use of anticoagulants: Secondary | ICD-10-CM

## 2010-11-17 DIAGNOSIS — I2699 Other pulmonary embolism without acute cor pulmonale: Secondary | ICD-10-CM

## 2010-11-17 DIAGNOSIS — Z23 Encounter for immunization: Secondary | ICD-10-CM

## 2010-11-17 NOTE — Patient Instructions (Signed)
Return to office in 4 weeks PT/INR   Continue current dose: 1 tab daily except 1 1/2 tab on Friday

## 2010-12-18 ENCOUNTER — Ambulatory Visit (INDEPENDENT_AMBULATORY_CARE_PROVIDER_SITE_OTHER): Payer: PRIVATE HEALTH INSURANCE | Admitting: *Deleted

## 2010-12-18 DIAGNOSIS — Z7901 Long term (current) use of anticoagulants: Secondary | ICD-10-CM

## 2010-12-18 DIAGNOSIS — I2699 Other pulmonary embolism without acute cor pulmonale: Secondary | ICD-10-CM

## 2010-12-18 NOTE — Patient Instructions (Signed)
Return to office in 4 weeks for PT/INR   Continue current dosing: Take 1 tab daily except 1 1/2 tab on Fri

## 2011-01-07 ENCOUNTER — Other Ambulatory Visit: Payer: Self-pay | Admitting: Family Medicine

## 2011-01-18 ENCOUNTER — Ambulatory Visit (INDEPENDENT_AMBULATORY_CARE_PROVIDER_SITE_OTHER): Payer: PRIVATE HEALTH INSURANCE

## 2011-01-18 DIAGNOSIS — Z7901 Long term (current) use of anticoagulants: Secondary | ICD-10-CM

## 2011-01-18 DIAGNOSIS — I82409 Acute embolism and thrombosis of unspecified deep veins of unspecified lower extremity: Secondary | ICD-10-CM

## 2011-01-18 LAB — POCT INR: INR: 2.4

## 2011-01-18 NOTE — Patient Instructions (Signed)
Per Dr.Lowne no change and recheck in 4 weeks

## 2011-02-20 ENCOUNTER — Ambulatory Visit (INDEPENDENT_AMBULATORY_CARE_PROVIDER_SITE_OTHER): Payer: PRIVATE HEALTH INSURANCE | Admitting: *Deleted

## 2011-02-20 DIAGNOSIS — Z7901 Long term (current) use of anticoagulants: Secondary | ICD-10-CM

## 2011-02-20 DIAGNOSIS — I82409 Acute embolism and thrombosis of unspecified deep veins of unspecified lower extremity: Secondary | ICD-10-CM

## 2011-02-20 LAB — POCT INR: INR: 2.2

## 2011-02-20 NOTE — Patient Instructions (Signed)
Return to office in 4 weeks  No change continue 1 tab daily except 1 1/2 tab on Fri.

## 2011-03-23 ENCOUNTER — Ambulatory Visit (INDEPENDENT_AMBULATORY_CARE_PROVIDER_SITE_OTHER): Payer: PRIVATE HEALTH INSURANCE

## 2011-03-23 VITALS — BP 122/68 | HR 73 | Temp 97.4°F | Wt 186.0 lb

## 2011-03-23 DIAGNOSIS — Z7901 Long term (current) use of anticoagulants: Secondary | ICD-10-CM

## 2011-03-23 NOTE — Patient Instructions (Signed)
Continue current dose and recheck in 4 weeks or sooner if needed

## 2011-04-08 ENCOUNTER — Other Ambulatory Visit: Payer: Self-pay | Admitting: Family Medicine

## 2011-04-20 ENCOUNTER — Ambulatory Visit: Payer: PRIVATE HEALTH INSURANCE

## 2011-04-20 ENCOUNTER — Ambulatory Visit (INDEPENDENT_AMBULATORY_CARE_PROVIDER_SITE_OTHER): Payer: PRIVATE HEALTH INSURANCE

## 2011-04-20 VITALS — BP 114/70 | HR 57 | Temp 98.5°F | Wt 188.4 lb

## 2011-04-20 DIAGNOSIS — Z7901 Long term (current) use of anticoagulants: Secondary | ICD-10-CM

## 2011-04-20 LAB — POCT INR: INR: 2.5

## 2011-04-20 NOTE — Patient Instructions (Signed)
Continue taking 1 tablet (5mg ) daily except and 1.5 (7.5mg )  tablet on friday

## 2011-04-27 ENCOUNTER — Ambulatory Visit: Payer: PRIVATE HEALTH INSURANCE

## 2011-05-18 ENCOUNTER — Ambulatory Visit (INDEPENDENT_AMBULATORY_CARE_PROVIDER_SITE_OTHER): Payer: PRIVATE HEALTH INSURANCE | Admitting: *Deleted

## 2011-05-18 DIAGNOSIS — Z7901 Long term (current) use of anticoagulants: Secondary | ICD-10-CM

## 2011-05-18 NOTE — Patient Instructions (Signed)
Take 1 tab daily except 1 1/2 tab on Fri. No changes per Dr. Drue Novel; recheck 4 weeks/SLS

## 2011-06-19 ENCOUNTER — Ambulatory Visit (INDEPENDENT_AMBULATORY_CARE_PROVIDER_SITE_OTHER): Payer: PRIVATE HEALTH INSURANCE | Admitting: *Deleted

## 2011-06-19 VITALS — BP 120/82 | HR 65 | Temp 97.6°F | Wt 185.0 lb

## 2011-06-19 DIAGNOSIS — Z7901 Long term (current) use of anticoagulants: Secondary | ICD-10-CM

## 2011-06-19 DIAGNOSIS — I82409 Acute embolism and thrombosis of unspecified deep veins of unspecified lower extremity: Secondary | ICD-10-CM

## 2011-06-19 NOTE — Patient Instructions (Signed)
Return to office in 4 weeks  Continue current dose: Take 1 tab daily except 1 1/2 tab on Fri 

## 2011-06-24 ENCOUNTER — Other Ambulatory Visit: Payer: Self-pay | Admitting: Family Medicine

## 2011-07-23 ENCOUNTER — Ambulatory Visit (INDEPENDENT_AMBULATORY_CARE_PROVIDER_SITE_OTHER): Payer: PRIVATE HEALTH INSURANCE

## 2011-07-23 VITALS — BP 118/70 | HR 64 | Temp 97.8°F | Wt 188.2 lb

## 2011-07-23 DIAGNOSIS — Z7901 Long term (current) use of anticoagulants: Secondary | ICD-10-CM

## 2011-07-23 DIAGNOSIS — D696 Thrombocytopenia, unspecified: Secondary | ICD-10-CM

## 2011-07-23 LAB — POCT INR: INR: 2.8

## 2011-07-23 NOTE — Patient Instructions (Addendum)
INR was normal today continue to take your coumadin 5 mg daily except on Friday take 7.5 mg and continue to watch your diet. Recheck in 3 weeks

## 2011-08-14 ENCOUNTER — Ambulatory Visit (INDEPENDENT_AMBULATORY_CARE_PROVIDER_SITE_OTHER): Payer: PRIVATE HEALTH INSURANCE | Admitting: *Deleted

## 2011-08-14 ENCOUNTER — Encounter: Payer: Self-pay | Admitting: *Deleted

## 2011-08-14 DIAGNOSIS — Z7901 Long term (current) use of anticoagulants: Secondary | ICD-10-CM

## 2011-08-14 NOTE — Patient Instructions (Signed)
Take 1 tab daily except 1 1/2 tab on Fri Return in one month per Arman Bogus, CMA/SLS

## 2011-09-10 ENCOUNTER — Ambulatory Visit (INDEPENDENT_AMBULATORY_CARE_PROVIDER_SITE_OTHER): Payer: PRIVATE HEALTH INSURANCE | Admitting: *Deleted

## 2011-09-10 ENCOUNTER — Encounter: Payer: Self-pay | Admitting: *Deleted

## 2011-09-10 VITALS — BP 125/85 | HR 72 | Temp 98.1°F | Wt 185.4 lb

## 2011-09-10 DIAGNOSIS — Z7901 Long term (current) use of anticoagulants: Secondary | ICD-10-CM

## 2011-09-10 DIAGNOSIS — Z5111 Encounter for antineoplastic chemotherapy: Secondary | ICD-10-CM

## 2011-09-10 LAB — POCT INR: INR: 2.6

## 2011-09-10 NOTE — Patient Instructions (Signed)
Today PT/INR 2.6 Continue same dosage Re-check in 4 weeks MD Lowne gave verbal instructions as follows: ok for pt to take benedryl

## 2011-09-14 ENCOUNTER — Ambulatory Visit: Payer: PRIVATE HEALTH INSURANCE

## 2011-09-25 ENCOUNTER — Other Ambulatory Visit: Payer: Self-pay | Admitting: Family Medicine

## 2011-10-09 ENCOUNTER — Ambulatory Visit (INDEPENDENT_AMBULATORY_CARE_PROVIDER_SITE_OTHER): Payer: PRIVATE HEALTH INSURANCE | Admitting: *Deleted

## 2011-10-09 VITALS — BP 118/80 | HR 69 | Wt 186.0 lb

## 2011-10-09 DIAGNOSIS — Z7901 Long term (current) use of anticoagulants: Secondary | ICD-10-CM

## 2011-10-09 DIAGNOSIS — I82409 Acute embolism and thrombosis of unspecified deep veins of unspecified lower extremity: Secondary | ICD-10-CM

## 2011-10-09 NOTE — Patient Instructions (Signed)
Return to office in 4 weeks  Continue current dose: Take 1 tab daily except 1 1/2 tab on Fri 

## 2011-10-29 ENCOUNTER — Encounter: Payer: Self-pay | Admitting: Family Medicine

## 2011-10-29 ENCOUNTER — Ambulatory Visit (INDEPENDENT_AMBULATORY_CARE_PROVIDER_SITE_OTHER): Payer: PRIVATE HEALTH INSURANCE | Admitting: Family Medicine

## 2011-10-29 VITALS — BP 120/72 | HR 59 | Temp 97.5°F | Ht 66.0 in | Wt 185.8 lb

## 2011-10-29 DIAGNOSIS — E785 Hyperlipidemia, unspecified: Secondary | ICD-10-CM

## 2011-10-29 DIAGNOSIS — Z0000 Encounter for general adult medical examination without abnormal findings: Secondary | ICD-10-CM

## 2011-10-29 DIAGNOSIS — I2699 Other pulmonary embolism without acute cor pulmonale: Secondary | ICD-10-CM

## 2011-10-29 DIAGNOSIS — Z86718 Personal history of other venous thrombosis and embolism: Secondary | ICD-10-CM

## 2011-10-29 DIAGNOSIS — Z125 Encounter for screening for malignant neoplasm of prostate: Secondary | ICD-10-CM

## 2011-10-29 DIAGNOSIS — D691 Qualitative platelet defects: Secondary | ICD-10-CM

## 2011-10-29 DIAGNOSIS — I359 Nonrheumatic aortic valve disorder, unspecified: Secondary | ICD-10-CM

## 2011-10-29 LAB — CBC WITH DIFFERENTIAL/PLATELET
Basophils Absolute: 0 10*3/uL (ref 0.0–0.1)
HCT: 50.5 % (ref 39.0–52.0)
Lymphs Abs: 1.3 10*3/uL (ref 0.7–4.0)
MCV: 101 fl — ABNORMAL HIGH (ref 78.0–100.0)
Monocytes Absolute: 0.4 10*3/uL (ref 0.1–1.0)
Neutrophils Relative %: 46.6 % (ref 43.0–77.0)
Platelets: 130 10*3/uL — ABNORMAL LOW (ref 150.0–400.0)
RDW: 13.2 % (ref 11.5–14.6)

## 2011-10-29 LAB — POCT URINALYSIS DIPSTICK
Bilirubin, UA: NEGATIVE
Blood, UA: NEGATIVE
Nitrite, UA: NEGATIVE
Spec Grav, UA: >=1.03
pH, UA: 6

## 2011-10-29 LAB — PROTIME-INR
INR: 2.2 ratio — ABNORMAL HIGH (ref 0.8–1.0)
Prothrombin Time: 24.8 s — ABNORMAL HIGH (ref 10.2–12.4)

## 2011-10-29 NOTE — Progress Notes (Signed)
Subjective:    Shawn [name] is a 77 y.o. male who presents for Medicare Annual/Subsequent preventive examination.   Preventive Screening-Counseling & Management  Tobacco History  Smoking status  . Never Smoker   Smokeless tobacco  . Not on file    Problems Prior to Visit 1.   Current Problems (verified) Patient Active Problem List  Diagnosis  . MOLE  . HYPERLIPIDEMIA  . FACTOR V DEFICIENCY  . THROMBOCYTOPENIA  . CERUMEN IMPACTION, RIGHT  . ROM  . PULMONARY EMBOLISM  . AORTIC STENOSIS, MILD  . DVT  . VARICOSE VEINS LOWER EXTREMITIES W/INFLAMMATION  . HEPATIC CYST  . OSTEOARTHRITIS  . CALF PAIN, LEFT  . MEMORY LOSS  . CARDIAC MURMUR  . DYSPHAGIA, UNSPECIFIED  . PULMONARY EMBOLISM, HX OF  . COLONIC POLYPS, ADENOMATOUS, HX OF  . BACK PAIN  . Overweight    Medications Prior to Visit Current Outpatient Prescriptions on File Prior to Visit  Medication Sig Dispense Refill  . B Complex-Biotin-FA (SUPER B-100) TABS Take 1 tablet by mouth daily.        . Cholecalciferol (CVS VITAMIN D3) 1000 UNITS capsule Take 1,000 Units by mouth daily.        . Coenzyme Q10 (COQ10) 100 MG CAPS Take 1 capsule by mouth daily.        . fish oil-omega-3 fatty acids 1000 MG capsule Take 1 g by mouth. 3 BY MOUTH DAILY      . folic acid (FOLVITE) 800 MCG tablet Take 400 mcg by mouth daily.        Marland Kitchen Hyaluronic Acid-Vitamin C (HYALURONIC ACID PO) Take 1 tablet by mouth daily.        . Multiple Vitamin (MULTIVITAMIN) tablet Take 1 tablet by mouth daily.        . Psyllium (WAL-MUCIL) 100 % POWD Take by mouth daily.        . Red Yeast Rice 600 MG CAPS Take 1 capsule by mouth daily.        . saw palmetto 160 MG capsule Take 160 mg by mouth daily.        Marland Kitchen warfarin (COUMADIN) 5 MG tablet TAKE AS DIRECTED  90 tablet  0    Current Medications (verified) Current Outpatient Prescriptions  Medication Sig Dispense Refill  . B Complex-Biotin-FA (SUPER B-100) TABS Take 1 tablet by mouth  daily.        . Cholecalciferol (CVS VITAMIN D3) 1000 UNITS capsule Take 1,000 Units by mouth daily.        . Coenzyme Q10 (COQ10) 100 MG CAPS Take 1 capsule by mouth daily.        . fish oil-omega-3 fatty acids 1000 MG capsule Take 1 g by mouth. 3 BY MOUTH DAILY      . folic acid (FOLVITE) 800 MCG tablet Take 400 mcg by mouth daily.        Marland Kitchen Hyaluronic Acid-Vitamin C (HYALURONIC ACID PO) Take 1 tablet by mouth daily.        . Multiple Vitamin (MULTIVITAMIN) tablet Take 1 tablet by mouth daily.        . Psyllium (WAL-MUCIL) 100 % POWD Take by mouth daily.        . Red Yeast Rice 600 MG CAPS Take 1 capsule by mouth daily.        . saw palmetto 160 MG capsule Take 160 mg by mouth daily.        Marland Kitchen warfarin (COUMADIN) 5 MG tablet TAKE AS DIRECTED  90 tablet  0     Allergies (verified) Niacin   PAST HISTORY  Family History Family History  Problem Relation Age of Onset  . Lung cancer    . Heart attack Mother 88  . Heart disease Mother     chf  . Arthritis Mother   . Cancer Father     lung    Social History History  Substance Use Topics  . Smoking status: Never Smoker   . Smokeless tobacco: Not on file  . Alcohol Use: No    Are there smokers in your home (other than you)?  No  Risk Factors Current exercise habits: walk daily  Dietary issues discussed: na   Cardiac risk factors: advanced age (older than 59 for men, 77 for women) and male gender.  Depression Screen (Note: if answer to either of the following is "Yes", a more complete depression screening is indicated)   Q1: Over the past two weeks, have you felt down, depressed or hopeless? No  Q2: Over the past two weeks, have you felt little interest or pleasure in doing things? No  Have you lost interest or pleasure in daily life? No  Do you often feel hopeless? No  Do you cry easily over simple problems? No  Activities of Daily Living In your present state of health, do you have any difficulty performing the following  activities?:  Driving? No Managing money?  No Feeding yourself? No Getting from bed to chair? No Climbing a flight of stairs? No Preparing food and eating?: No Bathing or showering? No Getting dressed: No Getting to the toilet? No Using the toilet:No Moving around from place to place: No In the past year have you fallen or had a near fall?:No   Are you sexually active?  Yes  Do you have more than one partner?  No  Hearing Difficulties: No Do you often ask people to speak up or repeat themselves? No Do you experience ringing or noises in your ears? No Do you have difficulty understanding soft or whispered voices? No   Do you feel that you have a problem with memory? No  Do you often misplace items? No  Do you feel safe at home?  No  Cognitive Testing  Alert? Yes  Normal Appearance?Yes  Oriented to person? Yes  Place? Yes   Time? Yes  Recall of three objects?  Yes  Can perform simple calculations? Yes  Displays appropriate judgment?Yes  Can read the correct time from a watch face?Yes   Advanced Directives have been discussed with the patient? Yes   List the Names of Other Physician/Practitioners you currently use: 1.  GI --- Juanda Chance 2.  optho-- [facility] 3.  Dentist-- [facility]  4 derm--? Indicate any recent Medical Services you may have received from other than [facility] providers in the past year (date may be approximate).  Immunization History  Administered Date(s) Administered  . Influenza Split 11/17/2010  . Influenza Whole 11/12/2007, 11/17/2008, 11/09/2009  . Pneumococcal Polysaccharide 09/04/2005  . Td 01/18/1999, 10/13/2008  . Zoster 11/27/2006    Screening Tests Health Maintenance  Topic Date Due  . Influenza Vaccine  10/07/2011  . Colonoscopy  03/19/2014  . Tetanus/tdap  10/14/2018  . Pneumococcal Polysaccharide Vaccine Age 42 And Over  Completed  . Zostavax  Completed    All answers were reviewed with the patient and necessary referrals were made:  Loreen Freud, DO   10/29/2011   History reviewed: allergies, current medications, past family history, past  medical history, past social history, past surgical history and problem list  Review of Systems  Review of Systems  Constitutional: Negative for activity change, appetite change and fatigue.  HENT: Negative for hearing loss, congestion, tinnitus and ear discharge.   Eyes: Negative for visual disturbance (see optho q1y -- vision corrected to 20/20 with glasses).  Respiratory: Negative for cough, chest tightness and shortness of breath.   Cardiovascular: Negative for chest pain, palpitations and leg swelling.  Gastrointestinal: Negative for abdominal pain, diarrhea, constipation and abdominal distention.  Genitourinary: Negative for urgency, frequency, decreased urine volume and difficulty urinating.  Musculoskeletal: Negative for back pain, arthralgias and gait problem.  Skin: Negative for color change, pallor and rash.  Neurological: Negative for dizziness, light-headedness, numbness and headaches.  Hematological: Negative for adenopathy. Does not bruise/bleed easily.  Psychiatric/Behavioral: Negative for suicidal ideas, confusion, sleep disturbance, self-injury, dysphoric mood, decreased concentration and agitation.  Pt is able to read and write and can do all ADLs No risk for falling No abuse/ violence in home     Objective:     Vision by Snellen chart: opth Blood pressure 120/72, pulse 59, temperature 97.5 F (36.4 C), temperature source Oral, height 5\' 6"  (1.676 m), weight 185 lb 12.8 oz (84.278 kg), SpO2 97.00%. Body mass index is 29.99 kg/(m^2).  BP 120/72  Pulse 59  Temp 97.5 F (36.4 C) (Oral)  Ht 5\' 6"  (1.676 m)  Wt 185 lb 12.8 oz (84.278 kg)  BMI 29.99 kg/m2  SpO2 97% General appearance: alert, cooperative, appears stated age and no distress Head: Normocephalic, without obvious abnormality, atraumatic Eyes: conjunctivae/corneas clear. PERRL, EOM's intact. Fundi  benign. Ears: normal TM's and external ear canals both ears Nose: Nares normal. Septum midline. Mucosa normal. No drainage or sinus tenderness. Throat: lips, mucosa, and tongue normal; teeth and gums normal Neck: no adenopathy, no carotid bruit, no JVD, supple, symmetrical, trachea midline and thyroid not enlarged, symmetric, no tenderness/mass/nodules Back: symmetric, no curvature. ROM normal. No CVA tenderness. Lungs: clear to auscultation bilaterally Chest wall: no tenderness Heart: S1, S2 normal-- +  murmur Abdomen: soft, non-tender; bowel sounds normal; no masses,  no organomegaly Male genitalia: normal Rectal: normal tone, normal prostate, no masses or tenderness--heme neg brown stool Extremities: extremities normal, atraumatic, no cyanosis or edema Pulses: 2+ and symmetric Skin: Skin color, texture, turgor normal. No rashes or lesions Lymph nodes: Cervical, supraclavicular, and axillary nodes normal. Neurologic: Alert and oriented X 3, normal strength and tone. Normal symmetric reflexes. Normal coordination and gait Psych-- no depression , anxiety     Assessment:     cpe    Plan:     During the course of the visit the patient was educated and counseled about appropriate screening and preventive services including:    Pneumococcal vaccine   Influenza vaccine  Prostate cancer screening  Colorectal cancer screening  Diabetes screening  Glaucoma screening  Nutrition counseling   Advanced directives: has an advanced directive - a copy HAS NOT been provided.  Diet review for nutrition referral? Yes ____  Not Indicated x____   Patient Instructions (the written plan) was given to the patient.  Medicare Attestation I have personally reviewed: The patient's medical and social history Their use of alcohol, tobacco or illicit drugs Their current medications and supplements The patient's functional ability including ADLs,fall risks, home safety risks, cognitive, and  hearing and visual impairment Diet and physical activities Evidence for depression or mood disorders  The patient's weight, height, BMI, and visual acuity  have been recorded in the chart.  I have made referrals, counseling, and provided education to the patient based on review of the above and I have provided the patient with a written personalized care plan for preventive services.     Loreen Freud, DO   10/29/2011

## 2011-10-29 NOTE — Assessment & Plan Note (Signed)
Check labs con't meds 

## 2011-10-29 NOTE — Assessment & Plan Note (Signed)
On coumadin 

## 2011-10-29 NOTE — Assessment & Plan Note (Signed)
Per cardiology 

## 2011-10-29 NOTE — Patient Instructions (Addendum)
Preventive Care for Adults, Male A healthy lifestyle and preventative care can promote health and wellness. Preventative health guidelines for men include the following key practices:  A routine yearly physical is a good way to check with your caregiver about your health and preventative screening. It is a chance to share any concerns and updates on your health, and to receive a thorough exam.   Visit your dentist for a routine exam and preventative care every 6 months. Brush your teeth twice a day and floss once a day. Good oral hygiene prevents tooth decay and gum disease.   The frequency of eye exams is based on your age, health, family medical history, use of contact lenses, and other factors. Follow your caregiver's recommendations for frequency of eye exams.   Eat a healthy diet. Foods like vegetables, fruits, whole grains, low-fat dairy products, and lean protein foods contain the nutrients you need without too many calories. Decrease your intake of foods high in solid fats, added sugars, and salt. Eat the right amount of calories for you.Get information about a proper diet from your caregiver, if necessary.   Regular physical exercise is one of the most important things you can do for your health. Most adults should get at least 150 minutes of moderate-intensity exercise (any activity that increases your heart rate and causes you to sweat) each week. In addition, most adults need muscle-strengthening exercises on 2 or more days a week.   Maintain a healthy weight. The body mass index (BMI) is a screening tool to identify possible weight problems. It provides an estimate of body fat based on height and weight. Your caregiver can help determine your BMI, and can help you achieve or maintain a healthy weight.For adults 20 years and older:   A BMI below 18.5 is considered underweight.   A BMI of 18.5 to 24.9 is normal.   A BMI of 25 to 29.9 is considered overweight.   A BMI of 30 and above  is considered obese.   Maintain normal blood lipids and cholesterol levels by exercising and minimizing your intake of saturated fat. Eat a balanced diet with plenty of fruit and vegetables. Blood tests for lipids and cholesterol should begin at age 20 and be repeated every 5 years. If your lipid or cholesterol levels are high, you are over 50, or you are a high risk for heart disease, you may need your cholesterol levels checked more frequently.Ongoing high lipid and cholesterol levels should be treated with medicines if diet and exercise are not effective.   If you smoke, find out from your caregiver how to quit. If you do not use tobacco, do not start.   If you choose to drink alcohol, do not exceed 2 drinks per day. One drink is considered to be 12 ounces (355 mL) of beer, 5 ounces (148 mL) of wine, or 1.5 ounces (44 mL) of liquor.   Avoid use of street drugs. Do not share needles with anyone. Ask for help if you need support or instructions about stopping the use of drugs.   High blood pressure causes heart disease and increases the risk of stroke. Your blood pressure should be checked at least every 1 to 2 years. Ongoing high blood pressure should be treated with medicines, if weight loss and exercise are not effective.   If you are 45 to 75 years old, ask your caregiver if you should take aspirin to prevent heart disease.   Diabetes screening involves taking a blood   sample to check your fasting blood sugar level. This should be done once every 3 years, after age 45, if you are within normal weight and without risk factors for diabetes. Testing should be considered at a younger age or be carried out more frequently if you are overweight and have at least 1 risk factor for diabetes.   Colorectal cancer can be detected and often prevented. Most routine colorectal cancer screening begins at the age of 50 and continues through age 75. However, your caregiver may recommend screening at an earlier  age if you have risk factors for colon cancer. On a yearly basis, your caregiver may provide home test kits to check for hidden blood in the stool. Use of a small camera at the end of a tube, to directly examine the colon (sigmoidoscopy or colonoscopy), can detect the earliest forms of colorectal cancer. Talk to your caregiver about this at age 50, when routine screening begins. Direct examination of the colon should be repeated every 5 to 10 years through age 75, unless early forms of pre-cancerous polyps or small growths are found.   Hepatitis C blood testing is recommended for all people born from 1945 through 1965 and any individual with known risks for hepatitis C.   Practice safe sex. Use condoms and avoid high-risk sexual practices to reduce the spread of sexually transmitted infections (STIs). STIs include gonorrhea, chlamydia, syphilis, trichomonas, herpes, HPV, and human immunodeficiency virus (HIV). Herpes, HIV, and HPV are viral illnesses that have no cure. They can result in disability, cancer, and death.   A one-time screening for abdominal aortic aneurysm (AAA) and surgical repair of large AAAs by sound wave imaging (ultrasonography) is recommended for ages 65 to 75 years who are current or former smokers.   Healthy men should no longer receive prostate-specific antigen (PSA) blood tests as part of routine cancer screening. Consult with your caregiver about prostate cancer screening.   Testicular cancer screening is not recommended for adult males who have no symptoms. Screening includes self-exam, caregiver exam, and other screening tests. Consult with your caregiver about any symptoms you have or any concerns you have about testicular cancer.   Use sunscreen with skin protection factor (SPF) of 30 or more. Apply sunscreen liberally and repeatedly throughout the day. You should seek shade when your shadow is shorter than you. Protect yourself by wearing long sleeves, pants, a  wide-brimmed hat, and sunglasses year round, whenever you are outdoors.   Once a month, do a whole body skin exam, using a mirror to look at the skin on your back. Notify your caregiver of new moles, moles that have irregular borders, moles that are larger than a pencil eraser, or moles that have changed in shape or color.   Stay current with required immunizations.   Influenza. You need a dose every fall (or winter). The composition of the flu vaccine changes each year, so being vaccinated once is not enough.   Pneumococcal polysaccharide. You need 1 to 2 doses if you smoke cigarettes or if you have certain chronic medical conditions. You need 1 dose at age 65 (or older) if you have never been vaccinated.   Tetanus, diphtheria, pertussis (Tdap, Td). Get 1 dose of Tdap vaccine if you are younger than age 65 years, are over 65 and have contact with an infant, are a healthcare worker, or simply want to be protected from whooping cough. After that, you need a Td booster dose every 10 years. Consult your caregiver if   you have not had at least 3 tetanus and diphtheria-containing shots sometime in your life or have a deep or dirty wound.   HPV. This vaccine is recommended for males 13 through 75 years of age. This vaccine may be given to men 22 through 75 years of age who have not completed the 3 dose series. It is recommended for men through age 26 who have sex with men or whose immune system is weakened because of HIV infection, other illness, or medications. The vaccine is given in 3 doses over 6 months.   Measles, mumps, rubella (MMR). You need at least 1 dose of MMR if you were born in 1957 or later. You may also need a 2nd dose.   Meningococcal. If you are age 19 to 21 years and a first-year college student living in a residence hall, or have one of several medical conditions, you need to get vaccinated against meningococcal disease. You may also need additional booster doses.   Zoster (shingles).  If you are age 60 years or older, you should get this vaccine.   Varicella (chickenpox). If you have never had chickenpox or you were vaccinated but received only 1 dose, talk to your caregiver to find out if you need this vaccine.   Hepatitis A. You need this vaccine if you have a specific risk factor for hepatitis A virus infection, or you simply wish to be protected from this disease. The vaccine is usually given as 2 doses, 6 to 18 months apart.   Hepatitis B. You need this vaccine if you have a specific risk factor for hepatitis B virus infection or you simply wish to be protected from this disease. The vaccine is given in 3 doses, usually over 6 months.  Preventative Service / Frequency Ages 19 to 39  Blood pressure check.** / Every 1 to 2 years.   Lipid and cholesterol check.** / Every 5 years beginning at age 20.   Hepatitis C blood test.** / For any individual with known risks for hepatitis C.   Skin self-exam. / Monthly.   Influenza immunization.** / Every year.   Pneumococcal polysaccharide immunization.** / 1 to 2 doses if you smoke cigarettes or if you have certain chronic medical conditions.   Tetanus, diphtheria, pertussis (Tdap,Td) immunization. / A one-time dose of Tdap vaccine. After that, you need a Td booster dose every 10 years.   HPV immunization. / 3 doses over 6 months, if 26 and younger.   Measles, mumps, rubella (MMR) immunization. / You need at least 1 dose of MMR if you were born in 1957 or later. You may also need a 2nd dose.   Meningococcal immunization. / 1 dose if you are age 19 to 21 years and a first-year college student living in a residence hall, or have one of several medical conditions, you need to get vaccinated against meningococcal disease. You may also need additional booster doses.   Varicella immunization.** / Consult your caregiver.   Hepatitis A immunization.** / Consult your caregiver. 2 doses, 6 to 18 months apart.   Hepatitis B  immunization.** / Consult your caregiver. 3 doses usually over 6 months.  Ages 40 to 64  Blood pressure check.** / Every 1 to 2 years.   Lipid and cholesterol check.** / Every 5 years beginning at age 20.   Fecal occult blood test (FOBT) of stool. / Every year beginning at age 50 and continuing until age 75. You may not have to do this test if   you get colonoscopy every 10 years.   Flexible sigmoidoscopy** or colonoscopy.** / Every 5 years for a flexible sigmoidoscopy or every 10 years for a colonoscopy beginning at age 50 and continuing until age 75.   Hepatitis C blood test.** / For all people born from 1945 through 1965 and any individual with known risks for hepatitis C.   Skin self-exam. / Monthly.   Influenza immunization.** / Every year.   Pneumococcal polysaccharide immunization.** / 1 to 2 doses if you smoke cigarettes or if you have certain chronic medical conditions.   Tetanus, diphtheria, pertussis (Tdap/Td) immunization.** / A one-time dose of Tdap vaccine. After that, you need a Td booster dose every 10 years.   Measles, mumps, rubella (MMR) immunization. / You need at least 1 dose of MMR if you were born in 1957 or later. You may also need a 2nd dose.   Varicella immunization.**/ Consult your caregiver.   Meningococcal immunization.** / Consult your caregiver.   Hepatitis A immunization.** / Consult your caregiver. 2 doses, 6 to 18 months apart.   Hepatitis B immunization.** / Consult your caregiver. 3 doses, usually over 6 months.  Ages 65 and over  Blood pressure check.** / Every 1 to 2 years.   Lipid and cholesterol check.**/ Every 5 years beginning at age 20.   Fecal occult blood test (FOBT) of stool. / Every year beginning at age 50 and continuing until age 75. You may not have to do this test if you get colonoscopy every 10 years.   Flexible sigmoidoscopy** or colonoscopy.** / Every 5 years for a flexible sigmoidoscopy or every 10 years for a colonoscopy  beginning at age 50 and continuing until age 75.   Hepatitis C blood test.** / For all people born from 1945 through 1965 and any individual with known risks for hepatitis C.   Abdominal aortic aneurysm (AAA) screening.** / A one-time screening for ages 65 to 75 years who are current or former smokers.   Skin self-exam. / Monthly.   Influenza immunization.** / Every year.   Pneumococcal polysaccharide immunization.** / 1 dose at age 65 (or older) if you have never been vaccinated.   Tetanus, diphtheria, pertussis (Tdap, Td) immunization. / A one-time dose of Tdap vaccine if you are over 65 and have contact with an infant, are a healthcare worker, or simply want to be protected from whooping cough. After that, you need a Td booster dose every 10 years.   Varicella immunization. ** / Consult your caregiver.   Meningococcal immunization.** / Consult your caregiver.   Hepatitis A immunization. ** / Consult your caregiver. 2 doses, 6 to 18 months apart.   Hepatitis B immunization.** / Check with your caregiver. 3 doses, usually over 6 months.  **Family history and personal history of risk and conditions may change your caregiver's recommendations. Document Released: 03/20/2001 Document Revised: 01/11/2011 Document Reviewed: 06/19/2010 Exit[employer] Patient Information 2012 [employer]. 

## 2011-10-30 ENCOUNTER — Telehealth: Payer: Self-pay | Admitting: Internal Medicine

## 2011-10-30 LAB — LIPID PANEL
Cholesterol: 164 mg/dL (ref 0–200)
HDL: 37.2 mg/dL — ABNORMAL LOW (ref >39.00)
LDL Cholesterol: 93 mg/dL (ref 0–99)
Total CHOL/HDL Ratio: 4
Triglycerides: 167 mg/dL — ABNORMAL HIGH (ref 0.0–149.0)

## 2011-10-30 LAB — BASIC METABOLIC PANEL
BUN: 13 mg/dL (ref 6–23)
Chloride: 102 mEq/L (ref 96–112)
Creatinine, Ser: 1 mg/dL (ref 0.4–1.5)
GFR: 77.47 mL/min (ref >60.00)
Glucose, Bld: 93 mg/dL (ref 70–99)
Potassium: 4 mEq/L (ref 3.5–5.1)

## 2011-10-30 LAB — HEPATIC FUNCTION PANEL
Bilirubin, Direct: 0.2 mg/dL (ref 0.0–0.3)
Total Bilirubin: 1.6 mg/dL — ABNORMAL HIGH (ref 0.3–1.2)
Total Protein: 7.3 g/dL (ref 6.0–8.3)

## 2011-10-30 NOTE — Telephone Encounter (Signed)
Pt had a COLON by Dr Doreatha Martin in 2006 showing a polyp, Diverticulosis and External Hemorrhoids; recall should have been in 2011, but pt states Dr Juanda Chance was reluctant to do d/t pt on Coumadin for DVTs.  Pt received a letter from his insurance company stating they will no longer cover a COLON after he reaches 75 years of age. Pt wants to know if you will repeat his COLON before December, 2013? Pt remains on Coumadin managed by Dr Laury Axon for DVTs. Thanks.

## 2011-10-30 NOTE — Telephone Encounter (Signed)
Hyperplastic polyp in 2006, he ought to have a colonoscopy before age 77. I would like to see him in the office to determine if we can hold the Coumadin or if we ought to do him on Coumadin. If I don't have a place on my schedule, he may see an extender to set up colonoscopy

## 2011-10-31 NOTE — Telephone Encounter (Signed)
Informed pt of Dr Regino Schultze recommendations. We could not find an appt for pt with Dr Juanda Chance in October, so he was scheduled with Willette Cluster, NP this Friday so his COLON can be arranged prior to December.

## 2011-11-02 ENCOUNTER — Telehealth: Payer: Self-pay | Admitting: *Deleted

## 2011-11-02 ENCOUNTER — Encounter: Payer: Self-pay | Admitting: Nurse Practitioner

## 2011-11-02 ENCOUNTER — Ambulatory Visit (INDEPENDENT_AMBULATORY_CARE_PROVIDER_SITE_OTHER): Payer: PRIVATE HEALTH INSURANCE | Admitting: Nurse Practitioner

## 2011-11-02 VITALS — BP 120/72 | HR 58 | Ht 66.0 in | Wt 186.0 lb

## 2011-11-02 DIAGNOSIS — R198 Other specified symptoms and signs involving the digestive system and abdomen: Secondary | ICD-10-CM

## 2011-11-02 DIAGNOSIS — Z860100 Personal history of colon polyps, unspecified: Secondary | ICD-10-CM

## 2011-11-02 DIAGNOSIS — D682 Hereditary deficiency of other clotting factors: Secondary | ICD-10-CM

## 2011-11-02 DIAGNOSIS — D688 Other specified coagulation defects: Secondary | ICD-10-CM | POA: Insufficient documentation

## 2011-11-02 DIAGNOSIS — R194 Change in bowel habit: Secondary | ICD-10-CM | POA: Insufficient documentation

## 2011-11-02 DIAGNOSIS — D689 Coagulation defect, unspecified: Secondary | ICD-10-CM

## 2011-11-02 HISTORY — DX: Personal history of colon polyps, unspecified: Z86.0100

## 2011-11-02 HISTORY — DX: Other specified coagulation defects: D68.8

## 2011-11-02 MED ORDER — MOVIPREP 100 G PO SOLR: 1.0000 | Freq: Once | ORAL | Status: AC

## 2011-11-02 MED ORDER — ENOXAPARIN SODIUM 80 MG/0.8ML ~~LOC~~ SOLN: 80.0000 mg | Freq: Two times a day (BID) | SUBCUTANEOUS | Status: DC

## 2011-11-02 NOTE — Telephone Encounter (Signed)
We will have pt go to [facility] for lovenox bridge for colonoscopy

## 2011-11-02 NOTE — Telephone Encounter (Signed)
Discussed with patient and he voiced understanding--- 70 mg not on the system so Dr.Lowne Ok'd the 80 mg        KP

## 2011-11-02 NOTE — Telephone Encounter (Signed)
Stop coumadin 5 days before colonoscopy and start lovenox 70 mg [place] q12h 4 days before -----  After care per GI

## 2011-11-02 NOTE — Telephone Encounter (Signed)
FYI:Pam from [facility] advised pt is scheduled to have Colonoscopy next Friday and that MD Lowne is in charge of pt Coumadin and wants MD Lowne to advise the regimen for upcoming procedure, Pam verified fax number per sending form for MD Lowne to fill out as well as a staff message for MD Lowne to address

## 2011-11-02 NOTE — Telephone Encounter (Signed)
Spoke with Cardiology and Dr.Lowne will need to proceed with the Lovenox bridge according to the needs of the patient and recommendations of the GI doctor and according to how long he will need to be off of the coumadin.

## 2011-11-02 NOTE — Patient Instructions (Addendum)
We have scheduled the  Colonoscopy with Dr Juanda Chance on 11-09-2011.  I am contacting Dr. Laury Axon today regarding the Coumadin and possible Lovenox bridge.   I will call you hopefully later today. We sent a prescription for the Moviprep to your pharmacy, [facility] [address]

## 2011-11-02 NOTE — Telephone Encounter (Signed)
Shawn Freud, DO 11/02/2011 11:04 AM Signed  We will have pt go to [facility] for lovenox bridge for colonoscopy Derry Lory, LPN 0/45/[id number] 1:19 AM Signed  FYI:[name] from [facility] advised pt is scheduled to have Colonoscopy next Friday and that MD Lowne is in charge of pt Coumadin and wants MD Lowne to advise the regimen for upcoming procedure, Suanne Minahan verified fax number per sending form for MD Lowne to fill out as well as a staff message for MD Laury Axon to address           Encounter MyChart Messages    I spoke to [facility] Dr Ernst Spell nurse.  She is having [name] from Coumadin Clinic call the patient.

## 2011-11-02 NOTE — Telephone Encounter (Signed)
Called [name] to advise MD Lowne needs pt to have a lovenox bridge scheduled, and spoke with Shawn [name], Shawn [name] took over the call to address.

## 2011-11-02 NOTE — Progress Notes (Signed)
11/02/2011 Shawn [name] [id number] [birth year]   History of Present Illness:  This is a 77 year old known to Dr. Juanda Chance for a history of colon polyps. Patient is on chronic coumadin for factor V deficiency. He comes in today to discuss screening colonoscopy. Patient is not due for surveillance exam but insurance will not pay for colonoscopy once patient turns 77. Patient hasn't had any blood in his stool.  He has recently developed increased frequency of normal stools. No abdominal pain, No weight loss, no bleed in his stools.     Current Medications, Allergies, Past Medical History, Past Surgical History, Family History and Social History were reviewed in [employer] record.   Physical Exam: General: Well developed , white male in no acute distress Head: Normocephalic and atraumatic Eyes:  sclerae anicteric, conjunctiva pink  Ears: Normal auditory acuity Lungs: Clear throughout to auscultation Heart: Regular rate and rhythm Abdomen: Soft, non tender and non distended. No masses, no hepatomegaly. Normal bowel sounds Musculoskeletal: Symmetrical with no gross deformities  Extremities: No edema  Neurological: Alert oriented x 4, grossly nonfocal Psychological:  Alert and cooperative. Normal mood and affect  Assessment and Recommendations:  1. History of polyps. Patient is due for surveillance exam 2016. His insurance will not pay for colonoscopy past age of 46 however. Given bowel changes will proceed with colonoscopy now. Will contact PCP regarding anticoagulation. Patient will probably need Lovenox bridge.   2. Bowel changes. Patient having increased frequency of formed stools.   3. Chronic anticoagulation. 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    4. Mild aortic stenosis.

## 2011-11-06 ENCOUNTER — Encounter: Payer: Self-pay | Admitting: Internal Medicine

## 2011-11-06 NOTE — Progress Notes (Signed)
Agree with Ms. Guenther's assessment and plan. Dr. Juanda Chance to review also. Iva Boop, MD, Clementeen Graham

## 2011-11-06 NOTE — Progress Notes (Signed)
Not my pt

## 2011-11-06 NOTE — Progress Notes (Signed)
Routed in error

## 2011-11-06 NOTE — Progress Notes (Signed)
Reviewed and agree.

## 2011-11-08 ENCOUNTER — Ambulatory Visit: Payer: PRIVATE HEALTH INSURANCE

## 2011-11-08 ENCOUNTER — Telehealth: Payer: Self-pay

## 2011-11-08 NOTE — Telephone Encounter (Signed)
I Made wife aware and she voiced understanding.     KP

## 2011-11-08 NOTE — Telephone Encounter (Signed)
Depends on colonoscopy--- GI should advise him

## 2011-11-08 NOTE — Telephone Encounter (Signed)
Pt states going in tomorrow for a colostomy tomorrow pt has been doing shots in stomach 4 days, after procedure does he need to start back on Warfarin and stop shots? If start back on warfarin when? Plz Advise.     MW

## 2011-11-09 ENCOUNTER — Encounter: Payer: Self-pay | Admitting: Internal Medicine

## 2011-11-09 ENCOUNTER — Ambulatory Visit (AMBULATORY_SURGERY_CENTER): Payer: PRIVATE HEALTH INSURANCE | Admitting: Internal Medicine

## 2011-11-09 VITALS — BP 146/94 | HR 84 | Temp 97.5°F | Resp 23 | Ht 66.0 in | Wt 186.0 lb

## 2011-11-09 DIAGNOSIS — R198 Other specified symptoms and signs involving the digestive system and abdomen: Secondary | ICD-10-CM

## 2011-11-09 DIAGNOSIS — Z86010 Personal history of colonic polyps: Secondary | ICD-10-CM

## 2011-11-09 DIAGNOSIS — Z1211 Encounter for screening for malignant neoplasm of colon: Secondary | ICD-10-CM

## 2011-11-09 MED ORDER — SODIUM CHLORIDE 0.9 % IV SOLN: 500.0000 mL | INTRAVENOUS | Status: DC

## 2011-11-09 MED ORDER — ENOXAPARIN SODIUM 150 MG/ML ~~LOC~~ SOLN: 75.0000 mg | Freq: Two times a day (BID) | SUBCUTANEOUS | Status: DC

## 2011-11-09 NOTE — Progress Notes (Signed)
Patient did not experience any of the following events: a burn prior to discharge; a fall within the facility; wrong site/side/patient/procedure/implant event; or a hospital transfer or hospital admission upon discharge from the facility. [phone]) Patient did not have preoperative order for IV antibiotic SSI prophylaxis. [phone])   Pt. Has very small hematoma under IV site. Advised that bleeding under skin would reabsorb into body, and to elevate arm.

## 2011-11-09 NOTE — Patient Instructions (Addendum)

## 2011-11-09 NOTE — Progress Notes (Signed)
Pt's last dose of coumadin was Sunday, 11/04/11.  He started a lonvenox bridge 2 shots per day beginning Monday 11/05/11.  The last dose of lovenox was thurs, 11/08/11 at 2000. Maw

## 2011-11-09 NOTE — Op Note (Signed)
[facility] [address] [place], [address]   COLONOSCOPY PROCEDURE REPORT  PATIENT: Shawn [name], Shawn [name]  MR#: [id number] BIRTHDATE: Feb 24, 1934 , 76  yrs. old GENDER: Male ENDOSCOPIST: Hart Carwin, MD REFERRED BY: none PROCEDURE DATE:  11/09/2011 PROCEDURE:   Colonoscopy, surveillance and Colonoscopy, screening ASA CLASS:   Class III INDICATIONS:patient's personal history of colon polyps and pt on Lovenox bridge, off Coumadin ( hx PE and DVT),hx hyperplastic polyp 2006,. MEDICATIONS: MAC sedation, administered by CRNA and Propofol (Diprivan) 230 mg IV  DESCRIPTION OF PROCEDURE:   After the risks and benefits and of the procedure were explained, informed consent was obtained.  A digital rectal exam revealed no abnormalities of the rectum.    The LB [id number]  endoscope was introduced through the anus and advanced to the cecum, which was identified by both the appendix and ileocecal valve .  The quality of the prep was good, using MoviPrep .  The instrument was then slowly withdrawn as the colon was fully examined.     COLON FINDINGS: Small internal hemorrhoids were found. Retroflexed views revealed no abnormalities.     The scope was then withdrawn from the patient and the procedure completed.  COMPLICATIONS: There were no complications. ENDOSCOPIC IMPRESSION: Small internal hemorrhoids  RECOMMENDATIONS: High fiber diet resume Coumadin today 11/10/2011 continue Lovenox till Mon 11/12/2011 check Pro time om Mon 11/12/2011 in Coumadin clinic, You will then receive further  instructions   REPEAT EXAM: for No recall due to age..  cc:  _______________________________ eSignedHart Carwin, MD 11/09/2011 10:58 AM     PATIENT NAME:  Lamere, Lightner MR#: [id number]

## 2011-11-09 NOTE — Progress Notes (Signed)
Pressure applied to the abdomen to reach the cecum 

## 2011-11-12 ENCOUNTER — Telehealth: Payer: Self-pay | Admitting: *Deleted

## 2011-11-12 ENCOUNTER — Ambulatory Visit (INDEPENDENT_AMBULATORY_CARE_PROVIDER_SITE_OTHER): Payer: PRIVATE HEALTH INSURANCE

## 2011-11-12 VITALS — BP 118/78 | HR 71 | Temp 98.0°F | Wt 186.0 lb

## 2011-11-12 DIAGNOSIS — Z7901 Long term (current) use of anticoagulants: Secondary | ICD-10-CM

## 2011-11-12 LAB — POCT INR: INR: 1.2

## 2011-11-12 NOTE — Telephone Encounter (Signed)
  Follow up Call-  Call back number 11/09/2011  Post procedure Call Back phone  # [phone] hm  Permission to leave phone message Yes     Patient questions:  Do you have a fever, pain , or abdominal swelling? no Pain Score  0 *  Have you tolerated food without any problems? yes  Have you been able to return to your normal activities? yes  Do you have any questions about your discharge instructions: Diet   no Medications  no Follow up visit  no  Do you have questions or concerns about your Care? no  Actions: * If pain score is 4 or above: No action needed, pain <4.

## 2011-11-12 NOTE — Patient Instructions (Addendum)
Your INR was 1.2 today- Dr.Lowne would like for you to take 10 mg (2 pills) coumadin today and then 5 mg all other days and repeat your INR on Friday 11/16/11

## 2011-11-16 ENCOUNTER — Ambulatory Visit: Payer: PRIVATE HEALTH INSURANCE

## 2011-11-16 ENCOUNTER — Ambulatory Visit (INDEPENDENT_AMBULATORY_CARE_PROVIDER_SITE_OTHER): Payer: PRIVATE HEALTH INSURANCE | Admitting: General Practice

## 2011-11-16 DIAGNOSIS — Z7901 Long term (current) use of anticoagulants: Secondary | ICD-10-CM

## 2011-11-16 LAB — POCT INR: INR: 2

## 2011-11-20 NOTE — Progress Notes (Signed)
Not mine

## 2011-11-26 ENCOUNTER — Ambulatory Visit: Payer: PRIVATE HEALTH INSURANCE

## 2011-11-29 ENCOUNTER — Ambulatory Visit (INDEPENDENT_AMBULATORY_CARE_PROVIDER_SITE_OTHER): Payer: PRIVATE HEALTH INSURANCE

## 2011-11-29 DIAGNOSIS — Z23 Encounter for immunization: Secondary | ICD-10-CM

## 2011-12-14 ENCOUNTER — Ambulatory Visit (INDEPENDENT_AMBULATORY_CARE_PROVIDER_SITE_OTHER): Payer: PRIVATE HEALTH INSURANCE | Admitting: *Deleted

## 2011-12-14 VITALS — BP 118/78 | HR 63 | Wt 187.0 lb

## 2011-12-14 DIAGNOSIS — I82409 Acute embolism and thrombosis of unspecified deep veins of unspecified lower extremity: Secondary | ICD-10-CM

## 2011-12-14 DIAGNOSIS — Z7901 Long term (current) use of anticoagulants: Secondary | ICD-10-CM

## 2011-12-14 NOTE — Patient Instructions (Signed)
Return to office in 2 weeks  Continue current dose: 1 tab daily except 1 1/2 tab on fri

## 2011-12-22 ENCOUNTER — Other Ambulatory Visit: Payer: Self-pay | Admitting: Family Medicine

## 2011-12-28 ENCOUNTER — Ambulatory Visit (INDEPENDENT_AMBULATORY_CARE_PROVIDER_SITE_OTHER): Payer: PRIVATE HEALTH INSURANCE

## 2011-12-28 VITALS — BP 110/70 | HR 65 | Temp 97.6°F | Wt 187.2 lb

## 2011-12-28 DIAGNOSIS — I82409 Acute embolism and thrombosis of unspecified deep veins of unspecified lower extremity: Secondary | ICD-10-CM

## 2011-12-28 DIAGNOSIS — Z7901 Long term (current) use of anticoagulants: Secondary | ICD-10-CM

## 2011-12-28 LAB — POCT INR: INR: 2.5

## 2011-12-28 NOTE — Patient Instructions (Addendum)
INR was 2.5 today. Continue the same regimen of taking 5 mg coumadin daily except 7.5 mg on Friday and we will recheck INR levels in 1 month or sooner if needed.        KP

## 2012-01-28 ENCOUNTER — Ambulatory Visit (INDEPENDENT_AMBULATORY_CARE_PROVIDER_SITE_OTHER): Payer: PRIVATE HEALTH INSURANCE | Admitting: *Deleted

## 2012-01-28 VITALS — BP 120/70 | HR 98 | Wt 190.0 lb

## 2012-01-28 DIAGNOSIS — Z7901 Long term (current) use of anticoagulants: Secondary | ICD-10-CM

## 2012-01-28 DIAGNOSIS — I82409 Acute embolism and thrombosis of unspecified deep veins of unspecified lower extremity: Secondary | ICD-10-CM

## 2012-01-28 LAB — POCT INR: INR: 2.7

## 2012-01-28 NOTE — Patient Instructions (Signed)
Return to office in 4 weeks  Continue current dose: 1 tab daily except 1 1/2 tab on Friday.

## 2012-03-04 ENCOUNTER — Ambulatory Visit (INDEPENDENT_AMBULATORY_CARE_PROVIDER_SITE_OTHER): Payer: PRIVATE HEALTH INSURANCE | Admitting: *Deleted

## 2012-03-04 VITALS — BP 128/80 | HR 66

## 2012-03-04 DIAGNOSIS — I82409 Acute embolism and thrombosis of unspecified deep veins of unspecified lower extremity: Secondary | ICD-10-CM

## 2012-03-04 DIAGNOSIS — Z7901 Long term (current) use of anticoagulants: Secondary | ICD-10-CM

## 2012-03-04 NOTE — Patient Instructions (Addendum)
Per Dr. Laury Axon- No chance. Continue current does: 1 tab daily except 1 1/2 tab on Friday.  Return to office in 4 weeks.

## 2012-03-16 ENCOUNTER — Other Ambulatory Visit: Payer: Self-pay | Admitting: Family Medicine

## 2012-04-08 ENCOUNTER — Ambulatory Visit (INDEPENDENT_AMBULATORY_CARE_PROVIDER_SITE_OTHER): Payer: PRIVATE HEALTH INSURANCE | Admitting: *Deleted

## 2012-04-08 VITALS — BP 138/78

## 2012-04-08 DIAGNOSIS — I82409 Acute embolism and thrombosis of unspecified deep veins of unspecified lower extremity: Secondary | ICD-10-CM

## 2012-04-08 DIAGNOSIS — Z7901 Long term (current) use of anticoagulants: Secondary | ICD-10-CM

## 2012-04-08 LAB — POCT INR: INR: 2.6

## 2012-04-08 NOTE — Patient Instructions (Signed)
Return to office in 4 weeks  Continue current dose: 1 tab daily except 1 1/2 tab on Fri

## 2012-05-09 ENCOUNTER — Ambulatory Visit (INDEPENDENT_AMBULATORY_CARE_PROVIDER_SITE_OTHER): Payer: PRIVATE HEALTH INSURANCE

## 2012-05-09 VITALS — BP 124/70 | HR 69 | Temp 98.2°F | Wt 188.2 lb

## 2012-05-09 DIAGNOSIS — Z7901 Long term (current) use of anticoagulants: Secondary | ICD-10-CM

## 2012-05-09 DIAGNOSIS — I82409 Acute embolism and thrombosis of unspecified deep veins of unspecified lower extremity: Secondary | ICD-10-CM

## 2012-05-09 LAB — POCT INR: INR: 2.6

## 2012-05-09 NOTE — Patient Instructions (Addendum)
INR today was 2.6 which is great.Continue current dose: 1 tab daily except 1 1/2 tab on Fri. Return to office in 4 weeks

## 2012-06-09 ENCOUNTER — Ambulatory Visit (INDEPENDENT_AMBULATORY_CARE_PROVIDER_SITE_OTHER): Payer: PRIVATE HEALTH INSURANCE | Admitting: *Deleted

## 2012-06-09 ENCOUNTER — Other Ambulatory Visit: Payer: Self-pay | Admitting: Family Medicine

## 2012-06-09 VITALS — BP 120/78 | HR 82 | Wt 186.0 lb

## 2012-06-09 DIAGNOSIS — I82409 Acute embolism and thrombosis of unspecified deep veins of unspecified lower extremity: Secondary | ICD-10-CM

## 2012-06-09 DIAGNOSIS — Z7901 Long term (current) use of anticoagulants: Secondary | ICD-10-CM

## 2012-06-09 NOTE — Patient Instructions (Signed)
Return to office in 4 weeks  Continue current dose: 5 mg daily except 7.5 mg on Friday

## 2012-06-12 ENCOUNTER — Telehealth: Payer: Self-pay

## 2012-06-12 NOTE — Telephone Encounter (Signed)
error 

## 2012-07-10 ENCOUNTER — Ambulatory Visit: Payer: PRIVATE HEALTH INSURANCE | Admitting: Family Medicine

## 2012-07-10 ENCOUNTER — Ambulatory Visit (INDEPENDENT_AMBULATORY_CARE_PROVIDER_SITE_OTHER): Payer: PRIVATE HEALTH INSURANCE | Admitting: General Practice

## 2012-07-10 VITALS — BP 128/72 | HR 76 | Resp 16

## 2012-07-10 DIAGNOSIS — Z7901 Long term (current) use of anticoagulants: Secondary | ICD-10-CM

## 2012-07-10 DIAGNOSIS — D689 Coagulation defect, unspecified: Secondary | ICD-10-CM

## 2012-07-10 DIAGNOSIS — I2699 Other pulmonary embolism without acute cor pulmonale: Secondary | ICD-10-CM

## 2012-07-10 DIAGNOSIS — I82409 Acute embolism and thrombosis of unspecified deep veins of unspecified lower extremity: Secondary | ICD-10-CM

## 2012-07-10 LAB — POCT INR: INR: 3

## 2012-07-10 NOTE — Patient Instructions (Addendum)
New Dose: Remain on Same Dose 5mg  daily, 7.5mg  on Fridays.    Return In: 4 weeks

## 2012-08-13 ENCOUNTER — Ambulatory Visit (INDEPENDENT_AMBULATORY_CARE_PROVIDER_SITE_OTHER): Payer: PRIVATE HEALTH INSURANCE | Admitting: *Deleted

## 2012-08-13 VITALS — BP 128/68 | HR 64 | Temp 97.6°F | Wt 185.0 lb

## 2012-08-13 DIAGNOSIS — I82409 Acute embolism and thrombosis of unspecified deep veins of unspecified lower extremity: Secondary | ICD-10-CM

## 2012-08-13 DIAGNOSIS — Z7901 Long term (current) use of anticoagulants: Secondary | ICD-10-CM

## 2012-08-13 DIAGNOSIS — I2699 Other pulmonary embolism without acute cor pulmonale: Secondary | ICD-10-CM

## 2012-08-13 LAB — POCT INR: INR: 2.5

## 2012-08-13 NOTE — Patient Instructions (Addendum)
Continue with current dose and return in 1 month.

## 2012-08-25 ENCOUNTER — Other Ambulatory Visit: Payer: Self-pay | Admitting: *Deleted

## 2012-08-25 MED ORDER — WARFARIN SODIUM 5 MG PO TABS: ORAL_TABLET | ORAL | Status: DC

## 2012-08-25 NOTE — Telephone Encounter (Signed)
Rx has been refilled warfarin 5 mg.

## 2012-09-15 ENCOUNTER — Ambulatory Visit (INDEPENDENT_AMBULATORY_CARE_PROVIDER_SITE_OTHER): Payer: PRIVATE HEALTH INSURANCE

## 2012-09-15 VITALS — BP 138/82 | HR 81 | Temp 98.4°F | Wt 184.4 lb

## 2012-09-15 DIAGNOSIS — I82409 Acute embolism and thrombosis of unspecified deep veins of unspecified lower extremity: Secondary | ICD-10-CM

## 2012-09-15 LAB — POCT INR: INR: 2.3

## 2012-09-15 NOTE — Patient Instructions (Addendum)
Continue same dosing. One 5mg  everyday except Friday take 7.5mg .  Follow up one month.  I apologize for you wait today. I will work to do better next time.

## 2012-09-30 ENCOUNTER — Encounter: Payer: Self-pay | Admitting: Cardiology

## 2012-09-30 ENCOUNTER — Ambulatory Visit (INDEPENDENT_AMBULATORY_CARE_PROVIDER_SITE_OTHER): Payer: PRIVATE HEALTH INSURANCE | Admitting: Cardiology

## 2012-09-30 VITALS — BP 126/70 | HR 68 | Ht 66.0 in | Wt 180.0 lb

## 2012-09-30 DIAGNOSIS — I82409 Acute embolism and thrombosis of unspecified deep veins of unspecified lower extremity: Secondary | ICD-10-CM

## 2012-09-30 DIAGNOSIS — I2699 Other pulmonary embolism without acute cor pulmonale: Secondary | ICD-10-CM

## 2012-09-30 DIAGNOSIS — I359 Nonrheumatic aortic valve disorder, unspecified: Secondary | ICD-10-CM

## 2012-09-30 NOTE — Progress Notes (Signed)
PI The patient returns for two year follow up.  Since I last saw him he has done well. The patient denies any new symptoms such as chest discomfort or neck discomfort.  There has been no new shortness of breath, PND or orthopnea. There have been no reported palpitations, presyncope or syncope.  He does yard work without symptoms. He does have occasional left forearm numbness. He says this might be positional when he sleeps.  It has not happened reproducibly with exercise.  Allergies  Allergen Reactions  . Niacin Rash    "My whole body turned red"    Current Outpatient Prescriptions  Medication Sig Dispense Refill  . B Complex-Biotin-FA (SUPER B-100) TABS Take 1 tablet by mouth daily.        . Cholecalciferol ([employer] VITAMIN D3) 1000 UNITS capsule Take 1,000 Units by mouth daily.        . Coenzyme Q10 (COQ10) 100 MG CAPS Take 1 capsule by mouth daily.        . fish oil-omega-3 fatty acids 1000 MG capsule Take 1 g by mouth. 3 BY MOUTH DAILY      . folic acid (FOLVITE) 800 MCG tablet Take 400 mcg by mouth daily.        Marland Kitchen Hyaluronic Acid-Vitamin C (HYALURONIC ACID PO) Take 2 tablets by mouth daily.       . Multiple Vitamin (MULTIVITAMIN) tablet Take 1 tablet by mouth daily.        . Psyllium (WAL-MUCIL) 100 % POWD Take by mouth daily.        . Red Yeast Rice 600 MG CAPS Take 1 capsule by mouth daily.        . saw palmetto 160 MG capsule Take 160 mg by mouth daily.        Marland Kitchen warfarin (COUMADIN) 5 MG tablet TAKE AS DIRECTED  90 tablet  0   No current facility-administered medications for this visit.    Past Medical History  Diagnosis Date  . Hyperlipidemia   . Dysphagia   . Cardiac murmur     mild aortic sclerosis  . Osteoarthritis   . History of blood clots   . Factor V Leiden mutation   . Colon polyps     Hyperplastic  . Hemorrhoids   . Diverticulosis   . Allergy   . Cataract     left eye  . Clotting disorder     Factor v Leiden factor mutation    Past Surgical History    Procedure Laterality Date  . Appendectomy    . Vasectomy    . Tonsillectomy    . Inguinal hernia repair    . Cataract extraction  02/09/2007    right eye  . Varicose vein surgery      ROS:  As stated in the HPI and negative for all other systems.  PHYSICAL EXAM BP 126/70  Pulse 68  Ht 5\' 6"  (1.676 m)  Wt 180 lb (81.647 kg)  BMI 29.07 kg/m2 GENERAL:  Well appearing HEENT:  Pupils equal round and reactive, fundi not visualized, oral mucosa unremarkable NECK:  No jugular venous distention, waveform within normal limits, carotid upstroke brisk and symmetric, no bruits, no thyromegaly LYMPHATICS:  No cervical, inguinal adenopathy LUNGS:  Clear to auscultation bilaterally BACK:  No CVA tenderness CHEST:  Unremarkable HEART:  PMI not displaced or sustained,S1 and S2 within normal limits, no S3, no S4, no clicks, no rubs, apical systolic early peaking murmur ABD:  Flat, positive bowel sounds  normal in frequency in pitch, no bruits, no rebound, no guarding, no midline pulsatile mass, no hepatomegaly, no splenomegaly EXT:  2 plus pulses throughout, no edema, no cyanosis no clubbing SKIN:  No rashes no nodules NEURO:  Cranial nerves II through XII grossly intact, motor grossly intact throughout PSYCH:  Cognitively intact, oriented to person place and time  EKG:  Sinus rhythm, rate 68, axis within normal limits, intervals within normal limits, no acute ST-T wave changes.  09/30/2012   ASSESSMENT AND PLAN  ARM PAIN:   I think that there is a low pretest probability that this represents ischemic heart disease. I will bring the patient back for a POET (Plain Old Exercise Test). This will allow me to screen for obstructive coronary disease, risk stratify and very importantly provide a prescription for exercise.  AORTIC STENOSIS:  This is mild by clinical exam. However, it has been years since his echocardiogram.  I will repeat this.   and 83

## 2012-09-30 NOTE — Patient Instructions (Addendum)
The current medical regimen is effective;  continue present plan and medications.  Your physician has requested that you have an echocardiogram. Echocardiography is a painless test that uses sound waves to create images of your heart. It provides your doctor with information about the size and shape of your heart and how well your heart's chambers and valves are working. This procedure takes approximately one hour. There are no restrictions for this procedure.  Your physician has requested that you have an exercise tolerance test. For further information please visit [email or web address] Please also follow instruction sheet, as given.

## 2012-10-17 ENCOUNTER — Ambulatory Visit: Payer: PRIVATE HEALTH INSURANCE

## 2012-10-20 ENCOUNTER — Ambulatory Visit (INDEPENDENT_AMBULATORY_CARE_PROVIDER_SITE_OTHER): Payer: PRIVATE HEALTH INSURANCE | Admitting: Physician Assistant

## 2012-10-20 ENCOUNTER — Ambulatory Visit (HOSPITAL_COMMUNITY): Payer: Medicare Other | Attending: Cardiology | Admitting: Radiology

## 2012-10-20 DIAGNOSIS — M79602 Pain in left arm: Secondary | ICD-10-CM

## 2012-10-20 DIAGNOSIS — M79609 Pain in unspecified limb: Secondary | ICD-10-CM

## 2012-10-20 DIAGNOSIS — I359 Nonrheumatic aortic valve disorder, unspecified: Secondary | ICD-10-CM

## 2012-10-20 DIAGNOSIS — I079 Rheumatic tricuspid valve disease, unspecified: Secondary | ICD-10-CM | POA: Insufficient documentation

## 2012-10-20 NOTE — Progress Notes (Signed)
Echocardiogram performed.  

## 2012-10-20 NOTE — Progress Notes (Signed)
Exercise Treadmill Test  Pre-Exercise Testing Evaluation Rhythm: normal sinus  Rate: 69     Test  Exercise Tolerance Test Ordering MD: Angelina Sheriff, MD  Interpreting MD: Tereso Newcomer, PA-C  Unique Test No: 1  Treadmill:  1  Indication for ETT: Left arm numbness/pain, Aortic valve disorder  Contraindication to ETT: No   Stress Modality: exercise - treadmill  Cardiac Imaging Performed: non   Protocol: standard Bruce - maximal  Max BP:  187/88  Max MPHR (bpm):  145 85% MPR (bpm): 123  MPHR obtained (bpm):  150 % MPHR obtained:  103  Reached 85% MPHR (min:sec):  3:32 Total Exercise Time (min-sec):  6:00  Workload in METS:  7.0 Borg Scale: 16  Reason ETT Terminated:  patient's desire to stop    ST Segment Analysis At Rest: normal ST segments - no evidence of significant ST depression With Exercise: no evidence of significant ST depression  Other Information Arrhythmia:  Frequent PVCs in early exercise; improved with continued exercise Angina during ETT:  absent (0) Quality of ETT:  diagnostic  ETT Interpretation:  normal - no evidence of ischemia by ST analysis  Comments: Fair exercise tolerance. No chest pain. Normal BP response to exercise. Frequent PVCs in early exercise. No ST-T changes to suggest ischemia.  Good HR recovery in 1st minute post exercise.   Recommendations: Follow up with Dr. Rollene Rotunda as directed. Signed,  Tereso Newcomer, PA-C   10/20/2012 3:35 PM

## 2012-10-21 ENCOUNTER — Ambulatory Visit (INDEPENDENT_AMBULATORY_CARE_PROVIDER_SITE_OTHER): Payer: PRIVATE HEALTH INSURANCE | Admitting: *Deleted

## 2012-10-21 VITALS — BP 120/80 | HR 78 | Temp 97.7°F | Wt 180.0 lb

## 2012-10-21 DIAGNOSIS — Z7901 Long term (current) use of anticoagulants: Secondary | ICD-10-CM

## 2012-10-21 DIAGNOSIS — I2699 Other pulmonary embolism without acute cor pulmonale: Secondary | ICD-10-CM

## 2012-10-21 LAB — POCT INR: INR: 1.9

## 2012-10-21 NOTE — Patient Instructions (Addendum)
Take 7.5mg  tonight then resume normal schedule of 5mg  everyday except Friday when you take 7.5mg .Return in 2 weeks for INR recheck.

## 2012-10-29 ENCOUNTER — Telehealth: Payer: Self-pay

## 2012-10-29 NOTE — Telephone Encounter (Signed)
HM up to date with exception of flu vaccine. Recent EKG 09/30/12 Echo 10/20/12 Due for INR check PSA WNL 1 year ago. Sudden weight loss of 9 pounds over last 2 weeks. Meds Reconciled, allergies and pharmacy reviewed.  Calf pain x 2 days. Would you like to schedule any testing today?

## 2012-10-29 NOTE — Telephone Encounter (Signed)
Please advise      KP 

## 2012-10-30 ENCOUNTER — Ambulatory Visit (INDEPENDENT_AMBULATORY_CARE_PROVIDER_SITE_OTHER): Payer: PRIVATE HEALTH INSURANCE | Admitting: Family Medicine

## 2012-10-30 ENCOUNTER — Encounter: Payer: Self-pay | Admitting: Family Medicine

## 2012-10-30 VITALS — BP 126/74 | HR 62 | Temp 97.7°F | Ht 66.0 in | Wt 181.0 lb

## 2012-10-30 DIAGNOSIS — H6121 Impacted cerumen, right ear: Secondary | ICD-10-CM

## 2012-10-30 DIAGNOSIS — I2699 Other pulmonary embolism without acute cor pulmonale: Secondary | ICD-10-CM

## 2012-10-30 DIAGNOSIS — Z0000 Encounter for general adult medical examination without abnormal findings: Secondary | ICD-10-CM

## 2012-10-30 DIAGNOSIS — Z23 Encounter for immunization: Secondary | ICD-10-CM

## 2012-10-30 DIAGNOSIS — I359 Nonrheumatic aortic valve disorder, unspecified: Secondary | ICD-10-CM

## 2012-10-30 DIAGNOSIS — E785 Hyperlipidemia, unspecified: Secondary | ICD-10-CM

## 2012-10-30 DIAGNOSIS — H6120 Impacted cerumen, unspecified ear: Secondary | ICD-10-CM

## 2012-10-30 DIAGNOSIS — Z136 Encounter for screening for cardiovascular disorders: Secondary | ICD-10-CM

## 2012-10-30 DIAGNOSIS — Z86711 Personal history of pulmonary embolism: Secondary | ICD-10-CM

## 2012-10-30 DIAGNOSIS — Z125 Encounter for screening for malignant neoplasm of prostate: Secondary | ICD-10-CM

## 2012-10-30 LAB — BASIC METABOLIC PANEL
CO2: 33 mEq/L — ABNORMAL HIGH (ref 19–32)
Calcium: 9.4 mg/dL (ref 8.4–10.5)
Creatinine, Ser: 1 mg/dL (ref 0.4–1.5)

## 2012-10-30 LAB — CBC WITH DIFFERENTIAL/PLATELET
Basophils Relative: 0.5 % (ref 0.0–3.0)
Eosinophils Absolute: 0.1 10*3/uL (ref 0.0–0.7)
Lymphocytes Relative: 39.2 % (ref 12.0–46.0)
MCHC: 34.8 g/dL (ref 30.0–36.0)
Neutrophils Relative %: 48.6 % (ref 43.0–77.0)
RBC: 5.05 Mil/uL (ref 4.22–5.81)
WBC: 3.5 10*3/uL — ABNORMAL LOW (ref 4.5–10.5)

## 2012-10-30 LAB — PSA: PSA: 2.2 ng/mL (ref 0.10–4.00)

## 2012-10-30 LAB — HEPATIC FUNCTION PANEL
ALT: 24 U/L (ref 0–53)
AST: 26 U/L (ref 0–37)
Albumin: 4.3 g/dL (ref 3.5–5.2)
Bilirubin, Direct: 0.2 mg/dL (ref 0.0–0.3)
Total Bilirubin: 1.6 mg/dL — ABNORMAL HIGH (ref 0.3–1.2)
Total Protein: 7.3 g/dL (ref 6.0–8.3)

## 2012-10-30 LAB — LIPID PANEL
LDL Cholesterol: 104 mg/dL — ABNORMAL HIGH (ref 0–99)
Total CHOL/HDL Ratio: 4
Triglycerides: 104 mg/dL (ref 0.0–149.0)

## 2012-10-30 NOTE — Patient Instructions (Addendum)
Preventive Care for Adults, Male  A healthy lifestyle and preventive care can promote health and wellness. Preventive health guidelines for men include the following key practices:  · A routine yearly physical is a good way to check with your caregiver about your health and preventative screening. It is a chance to share any concerns and updates on your health, and to receive a thorough exam.  · Visit your dentist for a routine exam and preventative care every 6 months. Brush your teeth twice a day and floss once a day. Good oral hygiene prevents tooth decay and gum disease.  · The frequency of eye exams is based on your age, health, family medical history, use of contact lenses, and other factors. Follow your caregiver's recommendations for frequency of eye exams.  · Eat a healthy diet. Foods like vegetables, fruits, whole grains, low-fat dairy products, and lean protein foods contain the nutrients you need without too many calories. Decrease your intake of foods high in solid fats, added sugars, and salt. Eat the right amount of calories for you. Get information about a proper diet from your caregiver, if necessary.  · Regular physical exercise is one of the most important things you can do for your health. Most adults should get at least 150 minutes of moderate-intensity exercise (any activity that increases your heart rate and causes you to sweat) each week. In addition, most adults need muscle-strengthening exercises on 2 or more days a week.  · Maintain a healthy weight. The body mass index (BMI) is a screening tool to identify possible weight problems. It provides an estimate of body fat based on height and weight. Your caregiver can help determine your BMI, and can help you achieve or maintain a healthy weight. For adults 20 years and older:  · A BMI below 18.5 is considered underweight.  · A BMI of 18.5 to 24.9 is normal.  · A BMI of 25 to 29.9 is considered overweight.  · A BMI of 30 and above is  considered obese.  · Maintain normal blood lipids and cholesterol levels by exercising and minimizing your intake of saturated fat. Eat a balanced diet with plenty of fruit and vegetables. Blood tests for lipids and cholesterol should begin at age 20 and be repeated every 5 years. If your lipid or cholesterol levels are high, you are over 50, or you are a high risk for heart disease, you may need your cholesterol levels checked more frequently. Ongoing high lipid and cholesterol levels should be treated with medicines if diet and exercise are not effective.  · If you smoke, find out from your caregiver how to quit. If you do not use tobacco, do not start.  · If you choose to drink alcohol, do not exceed 2 drinks per day. One drink is considered to be 12 ounces (355 mL) of beer, 5 ounces (148 mL) of wine, or 1.5 ounces (44 mL) of liquor.  · Avoid use of street drugs. Do not share needles with anyone. Ask for help if you need support or instructions about stopping the use of drugs.  · High blood pressure causes heart disease and increases the risk of stroke. Your blood pressure should be checked at least every 1 to 2 years. Ongoing high blood pressure should be treated with medicines, if weight loss and exercise are not effective.  · If you are 45 to 76 years old, ask your caregiver if you should take aspirin to prevent heart disease.  · Diabetes screening involves taking   a blood sample to check your fasting blood sugar level. This should be done once every 3 years, after age 45, if you are within normal weight and without risk factors for diabetes. Testing should be considered at a younger age or be carried out more frequently if you are overweight and have at least 1 risk factor for diabetes.  · Colorectal cancer can be detected and often prevented. Most routine colorectal cancer screening begins at the age of 50 and continues through age 75. However, your caregiver may recommend screening at an earlier age if you  have risk factors for colon cancer. On a yearly basis, your caregiver may provide home test kits to check for hidden blood in the stool. Use of a small camera at the end of a tube, to directly examine the colon (sigmoidoscopy or colonoscopy), can detect the earliest forms of colorectal cancer. Talk to your caregiver about this at age 50, when routine screening begins.  Direct examination of the colon should be repeated every 5 to 10 years through age 75, unless early forms of pre-cancerous polyps or small growths are found.  · Hepatitis C blood testing is recommended for all people born from 1945 through 1965 and any individual with known risks for hepatitis C.  · Practice safe sex. Use condoms and avoid high-risk sexual practices to reduce the spread of sexually transmitted infections (STIs). STIs include gonorrhea, chlamydia, syphilis, trichomonas, herpes, HPV, and human immunodeficiency virus (HIV). Herpes, HIV, and HPV are viral illnesses that have no cure. They can result in disability, cancer, and death.  · A one-time screening for abdominal aortic aneurysm (AAA) and surgical repair of large AAAs by sound wave imaging (ultrasonography) is recommended for ages 65 to 75 years who are current or former smokers.  · Healthy men should no longer receive prostate-specific antigen (PSA) blood tests as part of routine cancer screening. Consult with your caregiver about prostate cancer screening.  · Testicular cancer screening is not recommended for adult males who have no symptoms. Screening includes self-exam, caregiver exam, and other screening tests. Consult with your caregiver about any symptoms you have or any concerns you have about testicular cancer.  · Use sunscreen with skin protection factor (SPF) of 30 or more. Apply sunscreen liberally and repeatedly throughout the day. You should seek shade when your shadow is shorter than you. Protect yourself by wearing long sleeves, pants, a wide-brimmed hat, and  sunglasses year round, whenever you are outdoors.  · Once a month, do a whole body skin exam, using a mirror to look at the skin on your back. Notify your caregiver of new moles, moles that have irregular borders, moles that are larger than a pencil eraser, or moles that have changed in shape or color.  · Stay current with required immunizations.  · Influenza. You need a dose every fall (or winter). The composition of the flu vaccine changes each year, so being vaccinated once is not enough.  · Pneumococcal polysaccharide. You need 1 to 2 doses if you smoke cigarettes or if you have certain chronic medical conditions. You need 1 dose at age 65 (or older) if you have never been vaccinated.  · Tetanus, diphtheria, pertussis (Tdap, Td). Get 1 dose of Tdap vaccine if you are younger than age 65 years, are over 65 and have contact with an infant, are a healthcare worker, or simply want to be protected from whooping cough. After that, you need a Td booster dose every 10 years. Consult your   caregiver if you have not had at least 3 tetanus and diphtheria-containing shots sometime in your life or have a deep or dirty wound.  · HPV. This vaccine is recommended for males 13 through 76 years of age. This vaccine may be given to men 22 through 76 years of age who have not completed the 3 dose series. It is recommended for men through age 26 who have sex with men or whose immune system is weakened because of HIV infection, other illness, or medications. The vaccine is given in 3 doses over 6 months.  · Measles, mumps, rubella (MMR). You need at least 1 dose of MMR if you were born in 1957 or later. You may also need a 2nd dose.  · Meningococcal. If you are age 19 to 21 years and a first-year college student living in a residence hall, or have one of several medical conditions, you need to get vaccinated against meningococcal disease. You may also need additional booster doses.  · Zoster (shingles). If you are age 60 years or  older, you should get this vaccine.  · Varicella (chickenpox). If you have never had chickenpox or you were vaccinated but received only 1 dose, talk to your caregiver to find out if you need this vaccine.  · Hepatitis A. You need this vaccine if you have a specific risk factor for hepatitis A virus infection, or you simply wish to be protected from this disease. The vaccine is usually given as 2 doses, 6 to 18 months apart.  · Hepatitis B. You need this vaccine if you have a specific risk factor for hepatitis B virus infection or you simply wish to be protected from this disease. The vaccine is given in 3 doses, usually over 6 months.  Preventative Service / Frequency  Ages 19 to 39  · Blood pressure check.** / Every 1 to 2 years.  · Lipid and cholesterol check.** / Every 5 years beginning at age 20.  · Hepatitis C blood test.** / For any individual with known risks for hepatitis C.  · Skin self-exam. / Monthly.  · Influenza immunization.** / Every year.  · Pneumococcal polysaccharide immunization.** / 1 to 2 doses if you smoke cigarettes or if you have certain chronic medical conditions.  · Tetanus, diphtheria, pertussis (Tdap,Td) immunization. / A one-time dose of Tdap vaccine. After that, you need a Td booster dose every 10 years.  · HPV immunization. / 3 doses over 6 months, if 26 and younger.  · Measles, mumps, rubella (MMR) immunization. / You need at least 1 dose of MMR if you were born in 1957 or later. You may also need a 2nd dose.  · Meningococcal immunization. / 1 dose if you are age 19 to 21 years and a first-year college student living in a residence hall, or have one of several medical conditions, you need to get vaccinated against meningococcal disease. You may also need additional booster doses.  · Varicella immunization.** / Consult your caregiver.  · Hepatitis A immunization.** / Consult your caregiver. 2 doses, 6 to 18 months apart.  · Hepatitis B immunization.** / Consult your caregiver. 3 doses  usually over 6 months.  Ages 40 to 64  · Blood pressure check.** / Every 1 to 2 years.  · Lipid and cholesterol check.** / Every 5 years beginning at age 20.  · Fecal occult blood test (FOBT) of stool. / Every year beginning at age 50 and continuing until age 75. You may not have   to do this test if you get colonoscopy every 10 years.  · Flexible sigmoidoscopy** or colonoscopy.** / Every 5 years for a flexible sigmoidoscopy or every 10 years for a colonoscopy beginning at age 50 and continuing until age 75.  · Hepatitis C blood test.** / For all people born from 1945 through 1965 and any individual with known risks for hepatitis C.  · Skin self-exam. / Monthly.  · Influenza immunization.** / Every year.  · Pneumococcal polysaccharide immunization.** / 1 to 2 doses if you smoke cigarettes or if you have certain chronic medical conditions.  · Tetanus, diphtheria, pertussis (Tdap/Td) immunization.** / A one-time dose of Tdap vaccine. After that, you need a Td booster dose every 10 years.  · Measles, mumps, rubella (MMR) immunization.  / You need at least 1 dose of MMR if you were born in 1957 or later. You may also need a 2nd dose.  · Varicella immunization.**/ Consult your caregiver.  · Meningococcal immunization.** / Consult your caregiver.  · Hepatitis A immunization.** / Consult your caregiver. 2 doses, 6 to 18 months apart.  · Hepatitis B immunization.** / Consult your caregiver. 3 doses, usually over 6 months.  Ages 65 and over  · Blood pressure check.** / Every 1 to 2 years.  · Lipid and cholesterol check.**/ Every 5 years beginning at age 20.  · Fecal occult blood test (FOBT) of stool. / Every year beginning at age 50 and continuing until age 75. You may not have to do this test if you get colonoscopy every 10 years.  · Flexible sigmoidoscopy** or colonoscopy.** / Every 5 years for a flexible sigmoidoscopy or every 10 years for a colonoscopy beginning at age 50 and continuing until age 75.  · Hepatitis C blood  test.** / For all people born from 1945 through 1965 and any individual with known risks for hepatitis C.  · Abdominal aortic aneurysm (AAA) screening.** / A one-time screening for ages 65 to 75 years who are current or former smokers.  · Skin self-exam. / Monthly.  · Influenza immunization.** / Every year.  · Pneumococcal polysaccharide immunization.** / 1 dose at age 65 (or older) if you have never been vaccinated.  · Tetanus, diphtheria, pertussis (Tdap, Td) immunization. / A one-time dose of Tdap vaccine if you are over 65 and have contact with an infant, are a healthcare worker, or simply want to be protected from whooping cough. After that, you need a Td booster dose every 10 years.  · Varicella immunization. ** / Consult your caregiver.  · Meningococcal immunization.** / Consult your caregiver.  · Hepatitis A immunization. ** / Consult your caregiver. 2 doses, 6 to 18 months apart.  · Hepatitis B immunization.** / Check with your caregiver. 3 doses, usually over 6 months.  **Family history and personal history of risk and conditions may change your caregiver's recommendations.  Document Released: 03/20/2001 Document Revised: 04/16/2011 Document Reviewed: 06/19/2010  [employer]® Patient Information ©2014 [employer].

## 2012-10-30 NOTE — Progress Notes (Signed)
Subjective:    Shawn [name] is a 78 y.o. male who presents for Medicare Annual/Subsequent preventive examination.   Preventive Screening-Counseling & Management  Tobacco History  Smoking status  . Never Smoker   Smokeless tobacco  . Never Used    Problems Prior to Visit 1.   Current Problems (verified) Patient Active Problem List   Diagnosis Date Noted  . History of colon polyps 11/02/2011  . Bowel habit changes 11/02/2011  . Other and unspecified coagulation defects 11/02/2011  . Overweight 10/05/2010  . BACK PAIN 04/21/2010  . COLONIC POLYPS, ADENOMATOUS, HX OF 03/22/2010  . FACTOR V DEFICIENCY 12/01/2009  . THROMBOCYTOPENIA 12/01/2009  . PULMONARY EMBOLISM, HX OF 12/01/2009  . PULMONARY EMBOLISM 11/20/2009  . CERUMEN IMPACTION, RIGHT 07/29/2009  . VARICOSE VEINS LOWER EXTREMITIES W/INFLAMMATION 12/01/2008  . DVT 09/03/2008  . AORTIC STENOSIS, MILD 11/19/2007  . HEPATIC CYST 11/19/2007  . CALF PAIN, LEFT 11/11/2007  . MEMORY LOSS 08/28/2007  . MOLE 08/11/2007  . HYPERLIPIDEMIA 09/04/2006  . OSTEOARTHRITIS 09/04/2006  . CARDIAC MURMUR 09/04/2006  . DYSPHAGIA, UNSPECIFIED 09/04/2006    Medications Prior to Visit Current Outpatient Prescriptions on File Prior to Visit  Medication Sig Dispense Refill  . B Complex-Biotin-FA (SUPER B-100) TABS Take 1 tablet by mouth daily.        . Cholecalciferol ([employer]S VITAMIN D3) 1000 UNITS capsule Take 1,000 Units by mouth daily.        . Coenzyme Q10 (COQ10) 100 MG CAPS Take 1 capsule by mouth daily.        . fish oil-omega-3 fatty acids 1000 MG capsule Take 1 g by mouth. 3 BY MOUTH DAILY      . folic acid (FOLVITE) 800 MCG tablet Take 400 mcg by mouth daily.        Marland Kitchen Hyaluronic Acid-Vitamin C (HYALURONIC ACID PO) Take 2 tablets by mouth daily.       . Multiple Vitamin (MULTIVITAMIN) tablet Take 1 tablet by mouth daily.        . Psyllium (WAL-MUCIL) 100 % POWD Take by mouth daily.        . Red Yeast Rice 600 MG CAPS  Take 1 capsule by mouth daily.        . saw palmetto 160 MG capsule Take 160 mg by mouth daily.        Marland Kitchen warfarin (COUMADIN) 5 MG tablet TAKE AS DIRECTED  90 tablet  0   No current facility-administered medications on file prior to visit.    Current Medications (verified) Current Outpatient Prescriptions  Medication Sig Dispense Refill  . B Complex-Biotin-FA (SUPER B-100) TABS Take 1 tablet by mouth daily.        . Cholecalciferol (CVS VITAMIN D3) 1000 UNITS capsule Take 1,000 Units by mouth daily.        . Coenzyme Q10 (COQ10) 100 MG CAPS Take 1 capsule by mouth daily.        . fish oil-omega-3 fatty acids 1000 MG capsule Take 1 g by mouth. 3 BY MOUTH DAILY      . folic acid (FOLVITE) 800 MCG tablet Take 400 mcg by mouth daily.        Marland Kitchen Hyaluronic Acid-Vitamin C (HYALURONIC ACID PO) Take 2 tablets by mouth daily.       . Multiple Vitamin (MULTIVITAMIN) tablet Take 1 tablet by mouth daily.        . Psyllium (WAL-MUCIL) 100 % POWD Take by mouth daily.        Marland Kitchen  Red Yeast Rice 600 MG CAPS Take 1 capsule by mouth daily.        . saw palmetto 160 MG capsule Take 160 mg by mouth daily.        Marland Kitchen warfarin (COUMADIN) 5 MG tablet TAKE AS DIRECTED  90 tablet  0   No current facility-administered medications for this visit.     Allergies (verified) Niacin   PAST HISTORY  Family History Family History  Problem Relation Age of Onset  . Lung cancer Father   . Heart attack Mother 54  . Heart disease Mother     chf  . Arthritis Mother   . Colon cancer Neg Hx   . Esophageal cancer Neg Hx   . Rectal cancer Neg Hx   . Stomach cancer Neg Hx     Social History History  Substance Use Topics  . Smoking status: Never Smoker   . Smokeless tobacco: Never Used  . Alcohol Use: No    Are there smokers in your home (other than you)?  No  Risk Factors Current exercise habits: walk a mile a day with dog and cuts grass  Dietary issues discussed: na   Cardiac risk factors: advanced age (older  than 83 for men, 85 for women) and male gender.  Depression Screen (Note: if answer to either of the following is "Yes", a more complete depression screening is indicated)   Q1: Over the past two weeks, have you felt down, depressed or hopeless? No  Q2: Over the past two weeks, have you felt little interest or pleasure in doing things? No  Have you lost interest or pleasure in daily life? No  Do you often feel hopeless? No  Do you cry easily over simple problems? No  Activities of Daily Living In your present state of health, do you have any difficulty performing the following activities?:  Driving? No Managing money?  No Feeding yourself? No Getting from bed to chair? No Climbing a flight of stairs? No Preparing food and eating?: No Bathing or showering? No Getting dressed: No Getting to the toilet? No Using the toilet:No Moving around from place to place: No In the past year have you fallen or had a near fall?:No   Are you sexually active?  Yes  Do you have more than one partner?  No  Hearing Difficulties: No Do you often ask people to speak up or repeat themselves? No Do you experience ringing or noises in your ears? No Do you have difficulty understanding soft or whispered voices? No   Do you feel that you have a problem with memory? No  Do you often misplace items? No  Do you feel safe at home?  Yes  Cognitive Testing  Alert? Yes  Normal Appearance?Yes  Oriented to person? Yes  Place? Yes   Time? Yes  Recall of three objects?  Yes  Can perform simple calculations? Yes  Displays appropriate judgment?Yes  Can read the correct time from a watch face?Yes   Advanced Directives have been discussed with the patient? Yes   List the Names of Other Physician/Practitioners you currently use: 1.  opth--[facility] 2.  Dentist- [facility]erchers 3 cardiology-- [place]ochrein 4 derm--?  Indicate any recent Medical Services you may have received from other than [facility] providers in the past year  (date may be approximate).  Immunization History  Administered Date(s) Administered  . Influenza Split 11/17/2010, 11/29/2011  . Influenza Whole 11/12/2007, 11/17/2008, 11/09/2009  . Influenza,inj,Quad PF,36+ Mos 10/30/2012  . Pneumococcal  Polysaccharide 09/04/2005, 01/21/2012  . Td 01/18/1999, 10/13/2008  . Tdap 01/21/2012  . Zoster 11/27/2006    Screening Tests Health Maintenance  Topic Date Due  . Influenza Vaccine  09/05/2013  . Colonoscopy  11/08/2021  . Tetanus/tdap  01/20/2022  . Pneumococcal Polysaccharide Vaccine Age 23 And Over  Completed  . Zostavax  Completed    All answers were reviewed with the patient and necessary referrals were made:  Loreen Freud, DO   10/30/2012   History reviewed:  He  has a past medical history of Hyperlipidemia; Dysphagia; Cardiac murmur; Osteoarthritis; History of blood clots; Factor V Leiden mutation; Colon polyps; Hemorrhoids; Diverticulosis; Allergy; Cataract; and Clotting disorder. He  does not have any pertinent problems on file. He  has past surgical history that includes Appendectomy; Vasectomy; Tonsillectomy; Inguinal hernia repair; Cataract extraction (02/09/2007); and Varicose vein surgery. His family history includes Arthritis in his mother; Heart attack (age of onset: 62) in his mother; Heart disease in his mother; Lung cancer in his father. There is no history of Colon cancer, Esophageal cancer, Rectal cancer, or Stomach cancer. He  reports that he has never smoked. He has never used smokeless tobacco. He reports that he does not drink alcohol or use illicit drugs. He has a current medication list which includes the following prescription(s): super b-100, cholecalciferol, coq10, fish oil-omega-3 fatty acids, folic acid, hyaluronic acid-vitamin c, multivitamin, psyllium, red yeast rice, saw palmetto, and warfarin. Current Outpatient Prescriptions on File Prior to Visit  Medication Sig Dispense Refill  . B Complex-Biotin-FA (SUPER  B-100) TABS Take 1 tablet by mouth daily.        . Cholecalciferol (CVS VITAMIN D3) 1000 UNITS capsule Take 1,000 Units by mouth daily.        . Coenzyme Q10 (COQ10) 100 MG CAPS Take 1 capsule by mouth daily.        . fish oil-omega-3 fatty acids 1000 MG capsule Take 1 g by mouth. 3 BY MOUTH DAILY      . folic acid (FOLVITE) 800 MCG tablet Take 400 mcg by mouth daily.        Marland Kitchen Hyaluronic Acid-Vitamin C (HYALURONIC ACID PO) Take 2 tablets by mouth daily.       . Multiple Vitamin (MULTIVITAMIN) tablet Take 1 tablet by mouth daily.        . Psyllium (WAL-MUCIL) 100 % POWD Take by mouth daily.        . Red Yeast Rice 600 MG CAPS Take 1 capsule by mouth daily.        . saw palmetto 160 MG capsule Take 160 mg by mouth daily.        Marland Kitchen warfarin (COUMADIN) 5 MG tablet TAKE AS DIRECTED  90 tablet  0   No current facility-administered medications on file prior to visit.   He is allergic to niacin.  Review of Systems  Review of Systems  Constitutional: Negative for activity change, appetite change and fatigue.  HENT: Negative for hearing loss, congestion, tinnitus and ear discharge.   Eyes: Negative for visual disturbance (see optho q1y -- vision corrected to 20/20 with glasses).  Respiratory: Negative for cough, chest tightness and shortness of breath.   Cardiovascular: Negative for chest pain, palpitations and leg swelling.  Gastrointestinal: Negative for abdominal pain, diarrhea, constipation and abdominal distention.  Genitourinary: Negative for urgency, frequency, decreased urine volume and difficulty urinating.  Musculoskeletal: Negative for back pain, arthralgias and gait problem.  Skin: Negative for color change, pallor and rash.  Neurological: Negative for dizziness, light-headedness, numbness and headaches.  Hematological: Negative for adenopathy. Does not bruise/bleed easily.  Psychiatric/Behavioral: Negative for suicidal ideas, confusion, sleep disturbance, self-injury, dysphoric mood,  decreased concentration and agitation.  Pt is able to read and write and can do all ADLs No risk for falling No abuse/ violence in home      Objective:     Vision by Snellen chart: opth Blood pressure 126/74, pulse 62, temperature 97.7 F (36.5 C), temperature source Oral, height 5\' 6"  (1.676 m), weight 181 lb (82.101 kg), SpO2 95.00%. Body mass index is 29.23 kg/(m^2).  BP 126/74  Pulse 62  Temp(Src) 97.7 F (36.5 C) (Oral)  Ht 5\' 6"  (1.676 m)  Wt 181 lb (82.101 kg)  BMI 29.23 kg/m2  SpO2 95% General appearance: alert, cooperative, appears stated age and no distress Head: Normocephalic, without obvious abnormality, atraumatic Eyes: conjunctivae/corneas clear. PERRL, EOM&#39;s intact. Fundi benign. Ears: normal TM&#39;s and external ear canals both ears Nose: Nares normal. Septum midline. Mucosa normal. No drainage or sinus tenderness. Throat: lips, mucosa, and tongue normal; teeth and gums normal Neck: no adenopathy, supple, symmetrical, trachea midline and thyroid not enlarged, symmetric, no tenderness/mass/nodules Back: symmetric, no curvature. ROM normal. No CVA tenderness. Lungs: clear to auscultation bilaterally Chest wall: no tenderness Heart: S1, S2 normal and + murmur Abdomen: soft, non-tender; bowel sounds normal; no masses,  no organomegaly Male genitalia: normal, penis: no lesions or discharge. testes: no masses or tenderness. no hernias Rectal: normal tone, normal prostate, no masses or tenderness and soft brown guaiac negative stool noted Extremities: extremities normal, atraumatic, no cyanosis or edema Pulses: 2+ and symmetric Skin: Skin color, texture, turgor normal. No rashes or lesions Lymph nodes: Cervical, supraclavicular, and axillary nodes normal. Neurologic: Alert and oriented X 3, normal strength and tone. Normal symmetric reflexes. Normal coordination and gait Psych--- no depression      Assessment:     cpe     Plan:     During the course  of the visit the patient was educated and counseled about appropriate screening and preventive services including:    Pneumococcal vaccine   Influenza vaccine  Screening electrocardiogram  Prostate cancer screening  Colorectal cancer screening  Glaucoma screening  Advanced directives: has an advanced directive - a copy HAS NOT been provided.  Diet review for nutrition referral? Yes ____  Not Indicated ___x_   Patient Instructions (the written plan) was given to the patient.  Medicare Attestation I have personally reviewed: The patient's medical and social history Their use of alcohol, tobacco or illicit drugs Their current medications and supplements The patient's functional ability including ADLs,fall risks, home safety risks, cognitive, and hearing and visual impairment Diet and physical activities Evidence for depression or mood disorders  The patient's weight, height, BMI, and visual acuity have been recorded in the chart.  I have made referrals, counseling, and provided education to the patient based on review of the above and I have provided the patient with a written personalized care plan for preventive services.     Loreen Freud, DO   10/30/2012

## 2012-10-30 NOTE — Telephone Encounter (Signed)
Patient had OV 10/29/2012

## 2012-10-30 NOTE — Assessment & Plan Note (Signed)
Cerumen hoop used to remove wax No complications

## 2012-10-30 NOTE — Assessment & Plan Note (Signed)
Check PT/ INr

## 2012-10-30 NOTE — Assessment & Plan Note (Signed)
Check labs con't meds 

## 2012-10-30 NOTE — Assessment & Plan Note (Signed)
Check PT/INR 

## 2012-11-03 LAB — POCT URINALYSIS DIPSTICK
Bilirubin, UA: NEGATIVE
Blood, UA: NEGATIVE
Glucose, UA: NEGATIVE
Leukocytes, UA: NEGATIVE
Nitrite, UA: NEGATIVE
Urobilinogen, UA: 0.2
pH, UA: 6

## 2012-11-17 ENCOUNTER — Other Ambulatory Visit: Payer: Self-pay | Admitting: Family Medicine

## 2012-11-18 ENCOUNTER — Ambulatory Visit (INDEPENDENT_AMBULATORY_CARE_PROVIDER_SITE_OTHER): Payer: Medicare Other | Admitting: *Deleted

## 2012-11-18 VITALS — BP 122/80 | HR 79 | Temp 97.7°F | Wt 181.6 lb

## 2012-11-18 DIAGNOSIS — I2699 Other pulmonary embolism without acute cor pulmonale: Secondary | ICD-10-CM

## 2012-11-18 DIAGNOSIS — Z7901 Long term (current) use of anticoagulants: Secondary | ICD-10-CM

## 2012-11-18 LAB — POCT INR: INR: 2.3

## 2012-11-18 NOTE — Patient Instructions (Signed)
5mg  daily except 7.5mg  on Fridays. Return in 4 weeks.

## 2012-12-19 ENCOUNTER — Ambulatory Visit (INDEPENDENT_AMBULATORY_CARE_PROVIDER_SITE_OTHER): Payer: Medicare Other | Admitting: *Deleted

## 2012-12-19 VITALS — BP 120/78 | HR 75 | Temp 97.7°F | Wt 185.4 lb

## 2012-12-19 DIAGNOSIS — Z86718 Personal history of other venous thrombosis and embolism: Secondary | ICD-10-CM

## 2012-12-19 DIAGNOSIS — Z7901 Long term (current) use of anticoagulants: Secondary | ICD-10-CM

## 2012-12-19 DIAGNOSIS — I2699 Other pulmonary embolism without acute cor pulmonale: Secondary | ICD-10-CM

## 2012-12-19 LAB — POCT INR: INR: 2.2

## 2012-12-19 NOTE — Patient Instructions (Signed)
5mg daily except 7.5mg on Friday. Return 4 weeks 

## 2013-01-19 ENCOUNTER — Ambulatory Visit (INDEPENDENT_AMBULATORY_CARE_PROVIDER_SITE_OTHER): Payer: Medicare Other | Admitting: *Deleted

## 2013-01-19 VITALS — BP 130/82 | HR 69 | Temp 97.5°F | Wt 188.0 lb

## 2013-01-19 DIAGNOSIS — Z7901 Long term (current) use of anticoagulants: Secondary | ICD-10-CM

## 2013-01-19 DIAGNOSIS — Z86718 Personal history of other venous thrombosis and embolism: Secondary | ICD-10-CM

## 2013-01-19 LAB — POCT INR: INR: 2.7

## 2013-01-19 NOTE — Patient Instructions (Signed)
5mg daily except 7.5mg on Friday. Return 4 weeks 

## 2013-02-06 ENCOUNTER — Other Ambulatory Visit: Payer: Self-pay | Admitting: Family Medicine

## 2013-03-02 ENCOUNTER — Ambulatory Visit (INDEPENDENT_AMBULATORY_CARE_PROVIDER_SITE_OTHER): Payer: Medicare HMO | Admitting: *Deleted

## 2013-03-02 VITALS — BP 124/82 | HR 64 | Temp 97.6°F | Wt 188.0 lb

## 2013-03-02 DIAGNOSIS — I2699 Other pulmonary embolism without acute cor pulmonale: Secondary | ICD-10-CM

## 2013-03-02 DIAGNOSIS — I82409 Acute embolism and thrombosis of unspecified deep veins of unspecified lower extremity: Secondary | ICD-10-CM

## 2013-03-02 DIAGNOSIS — Z7901 Long term (current) use of anticoagulants: Secondary | ICD-10-CM

## 2013-03-02 LAB — POCT INR: INR: 2.1

## 2013-03-02 NOTE — Patient Instructions (Signed)
5mg daily except 7.5mg on Friday. Return 4 weeks 

## 2013-03-30 ENCOUNTER — Ambulatory Visit: Payer: Medicare HMO

## 2013-03-31 ENCOUNTER — Ambulatory Visit (INDEPENDENT_AMBULATORY_CARE_PROVIDER_SITE_OTHER): Payer: Medicare HMO | Admitting: *Deleted

## 2013-03-31 VITALS — BP 122/80 | HR 60 | Temp 98.7°F | Wt 186.0 lb

## 2013-03-31 DIAGNOSIS — Z7901 Long term (current) use of anticoagulants: Secondary | ICD-10-CM

## 2013-03-31 DIAGNOSIS — I2699 Other pulmonary embolism without acute cor pulmonale: Secondary | ICD-10-CM

## 2013-03-31 LAB — POCT INR: INR: 2.2

## 2013-03-31 NOTE — Patient Instructions (Signed)
5mg daily except 7.5mg on Friday. Return 4 weeks 

## 2013-04-28 ENCOUNTER — Ambulatory Visit (INDEPENDENT_AMBULATORY_CARE_PROVIDER_SITE_OTHER): Payer: Medicare HMO

## 2013-04-28 VITALS — BP 149/84 | HR 67 | Temp 98.4°F | Wt 188.0 lb

## 2013-04-28 DIAGNOSIS — Z7901 Long term (current) use of anticoagulants: Secondary | ICD-10-CM

## 2013-04-28 LAB — POCT INR: INR: 2.1

## 2013-04-28 NOTE — Progress Notes (Signed)
Pre visit review using our clinic review tool, if applicable. No additional management support is needed unless otherwise documented below in the visit note. 

## 2013-04-28 NOTE — Patient Instructions (Signed)
Take 5mg  all days except Friday take 7.5mg   Recheck in 4 weeks

## 2013-05-04 ENCOUNTER — Other Ambulatory Visit: Payer: Self-pay | Admitting: Family Medicine

## 2013-06-01 ENCOUNTER — Ambulatory Visit (INDEPENDENT_AMBULATORY_CARE_PROVIDER_SITE_OTHER): Payer: Medicare HMO

## 2013-06-01 VITALS — BP 131/81 | HR 73 | Temp 98.1°F | Wt 188.0 lb

## 2013-06-01 DIAGNOSIS — Z7901 Long term (current) use of anticoagulants: Secondary | ICD-10-CM

## 2013-06-01 DIAGNOSIS — I2782 Chronic pulmonary embolism: Secondary | ICD-10-CM

## 2013-06-01 LAB — POCT INR: INR: 2.2

## 2013-06-01 NOTE — Patient Instructions (Addendum)
Take 5mg  (1 tab) everyday except Friday take 7.5mg  (1.5 tabs)  Recheck in 4 weeks  Tues May 26 at 11am

## 2013-06-01 NOTE — Progress Notes (Signed)
Pre visit review using our clinic review tool, if applicable. No additional management support is needed unless otherwise documented below in the visit note. 

## 2013-06-30 ENCOUNTER — Ambulatory Visit (INDEPENDENT_AMBULATORY_CARE_PROVIDER_SITE_OTHER): Payer: Medicare HMO | Admitting: *Deleted

## 2013-06-30 VITALS — BP 133/80 | HR 62 | Temp 97.9°F | Wt 186.0 lb

## 2013-06-30 DIAGNOSIS — D682 Hereditary deficiency of other clotting factors: Secondary | ICD-10-CM

## 2013-06-30 DIAGNOSIS — Z7901 Long term (current) use of anticoagulants: Secondary | ICD-10-CM

## 2013-06-30 DIAGNOSIS — I2699 Other pulmonary embolism without acute cor pulmonale: Secondary | ICD-10-CM

## 2013-06-30 LAB — POCT INR
INR: 2.3
INR: 2.3

## 2013-06-30 NOTE — Patient Instructions (Signed)
5mg daily except 7.5mg on Friday. Return 4 weeks 

## 2013-07-27 ENCOUNTER — Other Ambulatory Visit: Payer: Self-pay | Admitting: Family Medicine

## 2013-08-04 ENCOUNTER — Ambulatory Visit (INDEPENDENT_AMBULATORY_CARE_PROVIDER_SITE_OTHER): Payer: Medicare HMO

## 2013-08-04 VITALS — BP 132/78 | HR 64 | Temp 97.9°F | Wt 184.4 lb

## 2013-08-04 DIAGNOSIS — Z7901 Long term (current) use of anticoagulants: Secondary | ICD-10-CM

## 2013-08-04 DIAGNOSIS — I749 Embolism and thrombosis of unspecified artery: Secondary | ICD-10-CM

## 2013-08-04 LAB — POCT INR: INR: 2.3

## 2013-08-04 NOTE — Progress Notes (Signed)
Pre visit review using our clinic review tool, if applicable. No additional management support is needed unless otherwise documented below in the visit note. 

## 2013-08-04 NOTE — Patient Instructions (Signed)
Take 5mg  all days except Friday take 7.5mg   Recheck in 4 weeks Appt Thursday 09/03/2013 at 11am

## 2013-09-03 ENCOUNTER — Ambulatory Visit (INDEPENDENT_AMBULATORY_CARE_PROVIDER_SITE_OTHER): Payer: Medicare HMO | Admitting: *Deleted

## 2013-09-03 DIAGNOSIS — Z7901 Long term (current) use of anticoagulants: Secondary | ICD-10-CM

## 2013-09-03 LAB — POCT INR: INR: 2.3

## 2013-09-03 NOTE — Patient Instructions (Signed)
5mg  daily except 7.5mg  on Friday. Return 4 weeks

## 2013-10-06 ENCOUNTER — Ambulatory Visit (INDEPENDENT_AMBULATORY_CARE_PROVIDER_SITE_OTHER): Payer: Medicare HMO

## 2013-10-06 DIAGNOSIS — Z86711 Personal history of pulmonary embolism: Secondary | ICD-10-CM

## 2013-10-06 LAB — PROTIME-INR
INR: 2.5 ratio — AB (ref 0.8–1.0)
Prothrombin Time: 27.1 s — ABNORMAL HIGH (ref 9.6–13.1)

## 2013-10-13 ENCOUNTER — Telehealth: Payer: Self-pay | Admitting: Family Medicine

## 2013-10-13 NOTE — Telephone Encounter (Signed)
Please advise      KP 

## 2013-10-13 NOTE — Telephone Encounter (Signed)
2.5 -- normal Recheck 1 month

## 2013-10-13 NOTE — Telephone Encounter (Signed)
Patient has been made aware and voiced understanding, he has agreed to follow up in 1 mo.       KP

## 2013-10-13 NOTE — Telephone Encounter (Signed)
Caller name: Prateek Relation to pt: self Call back number: [phone] Pharmacy:  Reason for call:    Patient is requesting INR results

## 2013-10-16 ENCOUNTER — Telehealth: Payer: Self-pay | Admitting: Family Medicine

## 2013-10-16 ENCOUNTER — Other Ambulatory Visit: Payer: Self-pay

## 2013-10-16 MED ORDER — WARFARIN SODIUM 5 MG PO TABS
ORAL_TABLET | ORAL | Status: DC
Start: 2013-10-16 — End: 2014-04-12

## 2013-10-16 NOTE — Telephone Encounter (Signed)
Caller name: Kamonte Relation to pt: Call back number:[phone] Pharmacy: [facility]  Reason for call:  Pt is needing a refill on Rx warfarin (COUMADIN) 5 MG tablet .  He is out.

## 2013-10-16 NOTE — Telephone Encounter (Signed)
Rx faxed.    KP 

## 2013-11-06 ENCOUNTER — Ambulatory Visit: Payer: Medicare HMO | Admitting: *Deleted

## 2013-11-13 ENCOUNTER — Ambulatory Visit (INDEPENDENT_AMBULATORY_CARE_PROVIDER_SITE_OTHER): Payer: Medicare HMO | Admitting: Medical

## 2013-11-13 ENCOUNTER — Encounter: Payer: Medicare HMO | Admitting: Family Medicine

## 2013-11-13 ENCOUNTER — Encounter: Payer: Self-pay | Admitting: Medical

## 2013-11-13 VITALS — BP 140/75 | HR 86 | Temp 97.7°F | Ht 67.2 in | Wt 179.4 lb

## 2013-11-13 DIAGNOSIS — Z0000 Encounter for general adult medical examination without abnormal findings: Secondary | ICD-10-CM

## 2013-11-13 DIAGNOSIS — E785 Hyperlipidemia, unspecified: Secondary | ICD-10-CM

## 2013-11-13 DIAGNOSIS — Z23 Encounter for immunization: Secondary | ICD-10-CM

## 2013-11-13 DIAGNOSIS — D682 Hereditary deficiency of other clotting factors: Secondary | ICD-10-CM

## 2013-11-13 DIAGNOSIS — Z7901 Long term (current) use of anticoagulants: Secondary | ICD-10-CM

## 2013-11-13 DIAGNOSIS — L723 Sebaceous cyst: Secondary | ICD-10-CM

## 2013-11-13 LAB — CBC WITH DIFFERENTIAL/PLATELET
Basophils Absolute: 0 10*3/uL (ref 0.0–0.1)
Basophils Relative: 0.3 % (ref 0.0–3.0)
Eosinophils Absolute: 0.1 10*3/uL (ref 0.0–0.7)
Eosinophils Relative: 2.6 % (ref 0.0–5.0)
HCT: 49.9 % (ref 39.0–52.0)
Hemoglobin: 16.8 g/dL (ref 13.0–17.0)
Lymphocytes Relative: 39 % (ref 12.0–46.0)
Lymphs Abs: 1.5 10*3/uL (ref 0.7–4.0)
MCHC: 33.7 g/dL (ref 30.0–36.0)
MCV: 99.8 fl (ref 78.0–100.0)
Monocytes Absolute: 0.3 10*3/uL (ref 0.1–1.0)
Monocytes Relative: 9 % (ref 3.0–12.0)
Neutro Abs: 1.8 10*3/uL (ref 1.4–7.7)
Neutrophils Relative %: 49.1 % (ref 43.0–77.0)
Platelets: 133 10*3/uL — ABNORMAL LOW (ref 150.0–400.0)
RBC: 5 Mil/uL (ref 4.22–5.81)
RDW: 12.9 % (ref 11.5–15.5)
WBC: 3.7 10*3/uL — ABNORMAL LOW (ref 4.0–10.5)

## 2013-11-13 LAB — COMPREHENSIVE METABOLIC PANEL
ALT: 21 U/L (ref 0–53)
AST: 28 U/L (ref 0–37)
Albumin: 3.8 g/dL (ref 3.5–5.2)
Alkaline Phosphatase: 55 U/L (ref 39–117)
BUN: 11 mg/dL (ref 6–23)
CALCIUM: 9.2 mg/dL (ref 8.4–10.5)
CHLORIDE: 101 meq/L (ref 96–112)
CO2: 30 mEq/L (ref 19–32)
Creatinine, Ser: 0.9 mg/dL (ref 0.4–1.5)
GFR: 85.91 mL/min (ref >60.00)
Glucose, Bld: 95 mg/dL (ref 70–99)
POTASSIUM: 3.8 meq/L (ref 3.5–5.1)
SODIUM: 137 meq/L (ref 135–145)
TOTAL PROTEIN: 7.6 g/dL (ref 6.0–8.3)
Total Bilirubin: 1.8 mg/dL — ABNORMAL HIGH (ref 0.2–1.2)

## 2013-11-13 LAB — LIPID PANEL
Cholesterol: 154 mg/dL (ref 0–200)
HDL: 31.7 mg/dL — ABNORMAL LOW (ref >39.00)
LDL Cholesterol: 85 mg/dL (ref 0–99)
NonHDL: 122.3
Total CHOL/HDL Ratio: 5
Triglycerides: 188 mg/dL — ABNORMAL HIGH (ref 0.0–149.0)
VLDL: 37.6 mg/dL (ref 0.0–40.0)

## 2013-11-13 LAB — POCT INR: INR: 2.6

## 2013-11-13 NOTE — Progress Notes (Signed)
   Subjective:    Patient ID: Shawn [name], male    DOB: Dec 10, 1934, 79 y.o.   MRN: [id number]  HPI  Pt in for a physical. He has not acute complaints.   Pt has questions on his lipids. In the past pravastatin caused him confusion. Was on red yeast rice tabs but was too large of tablets. Recent stopped.  Pt had colonoscopy in 2013. Was clear. Only internal hemorroid.  Last lipid panel 10-07-12. LDL was 104.   Chest CT in 2011 had mid atherosclerosis in thoracic and upper abdominal aorta.  No hx of smoking.  No diabetes and not obese.  Pt has history of PE. Is on warfarin. Needs inr today.   Pt urinates fine. No history of elevated psa.  Pt needs fluvaccine today.   Up to date on tdap, zostavax, and pneumococcal vaccine.       Review of Systems  Constitutional: Negative for fever, chills and fatigue.  HENT: Negative.   Respiratory: Negative for cough, choking, chest tightness, shortness of breath and wheezing.   Cardiovascular: Negative for chest pain, palpitations and leg swelling.  Gastrointestinal: Negative.   Genitourinary: Negative.   Musculoskeletal: Negative.   Skin: Negative.        He is aware of sebacious cyst on his back. Had for year and no changes.  Neurological: Negative.   Hematological: Negative for adenopathy. Does not bruise/bleed easily.  Psychiatric/Behavioral: Negative.        Objective:   Physical Exam  General Mental Status- Alert. Orientation- Oriented x3.  Build and Nutrition- Well nourished and Well Developed.  Skin General:-Normal. Color- Normal color. Moisture- Normal. Temperature-Warm.  HEENT  Ears- Normal. Auditory Canal- Bilateral-Normal. Tympanic Membrane- Bilateral-Normal. Eye Fundi-Bilateral-Normal. Pupil- bilateral- Direct reaction to light normal. Nose & Sinuses- Normal. Nostrils-Bilateral- Normal. Mouth & Throat-Normal.  Neck Neck- No Bruits or Masses. Trachea midline.  Thyroid- Normal.  Chest and  Lung Exam Percussion: Quality and Intensity-Percussion normal. Percussion of the chest reveals- No Dullness.  Palpation: Palpation of the chest reveals- Non-tender- No dullness. Auscultation: Breath Sounds- Normal.  Adventitous Sounds:-No adventitious sounds.  Cardiovascular Inspection:- No Heaves. Auscultation:-Normal sinus rhythm without murmur gallop, S1 WNL and S2 WNL.  Abdomen Inspection:-Inspection Normal. Inspection of the abdomen reveals- No hernias Palpation/Percussion:- Palpation and Percussion of the Abdomen reveal- Non Tender and No Palpable abdominal masses. Liver: Other Characteristics- No hepatomegaly. Spleen:Other Characteristics- No Splenomegaly. Auscultation:- Auscultation of the abdomen reveals- Bowel sounds normal and No Abdominal bruits.  Male Genitourinary Urethra:- No discharge. Penis- Circumcised. Scrotum- No masses. Testes- Bilateral-Normal. Rt nguinal canal possible small hernia high in the canal.  Rectal Anorectal Exam: Performed- Normal sphincter tone. No masses noted. Prostate smooth normal size. Stool HEME Negative.  Peripheral  Vascular Lower Extremity:Inspection- Bilateral-Inspection Normal  Palpation: Femoral pulse- Bilateral- 2+. Popliteal pulse- Bilateral-2+. Dorsalis pedis pulse- Bilateral- 1/2+. Edema- Bilateral- No edema.  Neurologic Mental Status:- Normal. Cranial Nerves:-Normal Bilaterally. Motor:-Normal. Strength:5/5 normal muscle strength-All Muscles. General Assessment of Reflexes: Right Knee-2+. Left Knee- 2+. Coordination-Normal. Gait- Normal.  Meningeal Signs- None.  Musculoskeletal Global Assessment General-Joints show full range of motion without obvious deformity and Normal muscle mass. Strength in upper and lower extremities.  Lymphatics General lymphatics Description- No generalized lymphadenopathy.  sking- sebacious cyst left side of back. Mall left of lumbar/thoracic junction        Assessment & Plan:

## 2013-11-13 NOTE — Progress Notes (Signed)
Pre visit review using our clinic review tool, if applicable. No additional management support is needed unless otherwise documented below in the visit note. 

## 2013-11-13 NOTE — Assessment & Plan Note (Addendum)
With hx of PE and DVT. On coumadin. Get inr today. Was 2.6. Stay on current dose. Repeat inr in one month.

## 2013-11-13 NOTE — Patient Instructions (Addendum)
On your physical exam, I want to point out  that you may have hernia in inguinal canal rt side high up. If this area increases in size or painful notify us and would refer you to general surgeon.  We will do labs today including your inr. We will call you with result and to let you know if medication needs to be adjusted.  We gave you fluvaccine today.  Follow up as needed or as regularly scheduled with your pcp.

## 2013-11-25 ENCOUNTER — Telehealth: Payer: Self-pay | Admitting: Family Medicine

## 2013-11-25 NOTE — Telephone Encounter (Signed)
Mailed copy today of patients wellness visit on 11/13/13,per his request.

## 2013-11-25 NOTE — Telephone Encounter (Signed)
Caller name: Yavuz  Relation to pt: Call back number: [phone] Pharmacy:  Reason for call: Pt requesting for copy of results on his cpe on 10.09.15. Pt states need documents for [facility] Visit it can be sent by mail. [place] [id number]. Please advise.

## 2013-12-15 ENCOUNTER — Ambulatory Visit (INDEPENDENT_AMBULATORY_CARE_PROVIDER_SITE_OTHER): Payer: Medicare HMO | Admitting: *Deleted

## 2013-12-15 DIAGNOSIS — Z23 Encounter for immunization: Secondary | ICD-10-CM

## 2013-12-15 LAB — POCT INR: INR: 2.2

## 2013-12-15 NOTE — Addendum Note (Signed)
Addended by: Peggyann Shoals on: 12/15/2013 10:50 AM   Modules accepted: Orders

## 2013-12-15 NOTE — Patient Instructions (Signed)
5mg  daily except 7.5mg  on Friday. Return 4 weeks

## 2013-12-16 ENCOUNTER — Ambulatory Visit: Payer: Medicare HMO

## 2014-01-14 ENCOUNTER — Ambulatory Visit (INDEPENDENT_AMBULATORY_CARE_PROVIDER_SITE_OTHER): Payer: Medicare HMO

## 2014-01-14 VITALS — BP 148/82 | HR 88 | Temp 97.8°F | Wt 178.8 lb

## 2014-01-14 DIAGNOSIS — Z7901 Long term (current) use of anticoagulants: Secondary | ICD-10-CM

## 2014-01-14 DIAGNOSIS — I2699 Other pulmonary embolism without acute cor pulmonale: Secondary | ICD-10-CM

## 2014-01-14 LAB — POCT INR: INR: 2.2

## 2014-01-14 NOTE — Progress Notes (Signed)
Pre visit review using our clinic review tool, if applicable. No additional management support is needed unless otherwise documented below in the visit note.  Patient tolerated procedure well.

## 2014-01-14 NOTE — Patient Instructions (Addendum)
Continue with 5mg  all days except Friday take 7.5mg  Recheck in 4 weeks. Appt: 02/11/2014 @1045am 

## 2014-02-03 ENCOUNTER — Encounter: Payer: Self-pay | Admitting: Family Medicine

## 2014-02-11 ENCOUNTER — Ambulatory Visit (INDEPENDENT_AMBULATORY_CARE_PROVIDER_SITE_OTHER): Payer: Medicare HMO

## 2014-02-11 VITALS — BP 154/85 | HR 72 | Temp 97.7°F | Wt 182.6 lb

## 2014-02-11 DIAGNOSIS — Z86711 Personal history of pulmonary embolism: Secondary | ICD-10-CM

## 2014-02-11 DIAGNOSIS — D682 Hereditary deficiency of other clotting factors: Secondary | ICD-10-CM

## 2014-02-11 DIAGNOSIS — Z86718 Personal history of other venous thrombosis and embolism: Secondary | ICD-10-CM

## 2014-02-11 DIAGNOSIS — Z7901 Long term (current) use of anticoagulants: Secondary | ICD-10-CM

## 2014-02-11 LAB — POCT INR: INR: 2.4

## 2014-02-11 NOTE — Progress Notes (Signed)
Pre visit review using our clinic review tool, if applicable. No additional management support is needed unless otherwise documented below in the visit note. 

## 2014-02-11 NOTE — Patient Instructions (Signed)
Continue to take 5 mg daily except 7.5mg  on Friday. Return to clinic in 6 weeks.

## 2014-03-25 ENCOUNTER — Ambulatory Visit (INDEPENDENT_AMBULATORY_CARE_PROVIDER_SITE_OTHER): Payer: Medicare HMO | Admitting: *Deleted

## 2014-03-25 VITALS — BP 126/76 | HR 78 | Temp 98.1°F | Resp 16 | Wt 183.6 lb

## 2014-03-25 DIAGNOSIS — D6851 Activated protein C resistance: Secondary | ICD-10-CM

## 2014-03-25 LAB — POCT INR: INR: 2.1

## 2014-03-25 NOTE — Progress Notes (Signed)
Pre visit review using our clinic review tool, if applicable. No additional management support is needed unless otherwise documented below in the visit note. 

## 2014-03-25 NOTE — Patient Instructions (Signed)
5mg  daily except 7.5mg  on Friday. Return to clinic in 6 weeks.

## 2014-04-07 ENCOUNTER — Telehealth: Payer: Self-pay | Admitting: Family Medicine

## 2014-04-07 DIAGNOSIS — E785 Hyperlipidemia, unspecified: Secondary | ICD-10-CM

## 2014-04-07 NOTE — Telephone Encounter (Signed)
Caller name: Estevan Ryder Relation to pt: spouse  Call back number: [phone] Pharmacy: [facility] [place], [place]. [phone] ([phone] (Fax)         Reason for call:  Spouse inquring if husband can take niacin 500 mg cholesterol medication and flax oil. (pt states the 4gram of fish oil doctor suggested upsets pt stomach) please advise

## 2014-04-07 NOTE — Telephone Encounter (Signed)
Yes he can try that-- we will have to watch his liver enzymes Check labs 2 months---- lipid, hep, bmp---hyperlipidemia

## 2014-04-07 NOTE — Telephone Encounter (Signed)
Please advise      KP 

## 2014-04-08 NOTE — Telephone Encounter (Signed)
Patient aware of Dr.Lown's recommendations and verbalized understanding. Repeat labs have been scheduled.    KP

## 2014-04-12 ENCOUNTER — Other Ambulatory Visit: Payer: Self-pay | Admitting: Family Medicine

## 2014-04-12 NOTE — Telephone Encounter (Signed)
Med filled.  

## 2014-05-06 ENCOUNTER — Ambulatory Visit (INDEPENDENT_AMBULATORY_CARE_PROVIDER_SITE_OTHER): Payer: Medicare HMO | Admitting: *Deleted

## 2014-05-06 VITALS — BP 128/90 | HR 68 | Temp 97.6°F | Resp 16 | Wt 182.0 lb

## 2014-05-06 DIAGNOSIS — D6851 Activated protein C resistance: Secondary | ICD-10-CM

## 2014-05-06 LAB — POCT INR: INR: 2.1

## 2014-05-06 NOTE — Patient Instructions (Signed)
5mg  daily except 7.5mg  on Friday. Return to clinic in 6 weeks.

## 2014-05-06 NOTE — Progress Notes (Signed)
Pre visit review using our clinic review tool, if applicable. No additional management support is needed unless otherwise documented below in the visit note. 

## 2014-06-08 ENCOUNTER — Other Ambulatory Visit: Payer: Medicare HMO

## 2014-06-17 ENCOUNTER — Other Ambulatory Visit (INDEPENDENT_AMBULATORY_CARE_PROVIDER_SITE_OTHER): Payer: Medicare HMO

## 2014-06-17 ENCOUNTER — Ambulatory Visit (INDEPENDENT_AMBULATORY_CARE_PROVIDER_SITE_OTHER): Payer: Medicare HMO

## 2014-06-17 VITALS — BP 135/87 | HR 65 | Temp 98.0°F | Wt 179.8 lb

## 2014-06-17 DIAGNOSIS — E785 Hyperlipidemia, unspecified: Secondary | ICD-10-CM

## 2014-06-17 DIAGNOSIS — Z7901 Long term (current) use of anticoagulants: Secondary | ICD-10-CM | POA: Diagnosis not present

## 2014-06-17 DIAGNOSIS — D688 Other specified coagulation defects: Secondary | ICD-10-CM | POA: Diagnosis not present

## 2014-06-17 DIAGNOSIS — D6851 Activated protein C resistance: Secondary | ICD-10-CM

## 2014-06-17 LAB — HEPATIC FUNCTION PANEL
ALT: 15 U/L (ref 0–53)
AST: 22 U/L (ref 0–37)
Albumin: 4 g/dL (ref 3.5–5.2)
Alkaline Phosphatase: 52 U/L (ref 39–117)
Bilirubin, Direct: 0.2 mg/dL (ref 0.0–0.3)
TOTAL PROTEIN: 6.8 g/dL (ref 6.0–8.3)
Total Bilirubin: 1.5 mg/dL — ABNORMAL HIGH (ref 0.2–1.2)

## 2014-06-17 LAB — BASIC METABOLIC PANEL
BUN: 10 mg/dL (ref 6–23)
CO2: 34 mEq/L — ABNORMAL HIGH (ref 19–32)
Calcium: 9.2 mg/dL (ref 8.4–10.5)
Chloride: 101 mEq/L (ref 96–112)
Creatinine, Ser: 0.95 mg/dL (ref 0.40–1.50)
GFR: 81.62 mL/min (ref >60.00)
Glucose, Bld: 95 mg/dL (ref 70–99)
Potassium: 4 mEq/L (ref 3.5–5.1)
SODIUM: 139 meq/L (ref 135–145)

## 2014-06-17 LAB — LIPID PANEL
Cholesterol: 168 mg/dL (ref 0–200)
HDL: 31.7 mg/dL — AB (ref >39.00)
NonHDL: 136.3
TRIGLYCERIDES: 253 mg/dL — AB (ref 0.0–149.0)
Total CHOL/HDL Ratio: 5
VLDL: 50.6 mg/dL — ABNORMAL HIGH (ref 0.0–40.0)

## 2014-06-17 LAB — POCT INR: INR: 2.1

## 2014-06-17 LAB — LDL CHOLESTEROL, DIRECT: Direct LDL: 86 mg/dL

## 2014-06-17 NOTE — Progress Notes (Signed)
Pre visit review using our clinic review tool, if applicable. No additional management support is needed unless otherwise documented below in the visit note. 

## 2014-06-17 NOTE — Patient Instructions (Addendum)
Continue with same dosage. 5mg  all days except Friday 7.5mg   Good to see you!! Next Appt Thursday 6/23 at 1030am

## 2014-07-12 ENCOUNTER — Other Ambulatory Visit: Payer: Self-pay

## 2014-07-12 MED ORDER — WARFARIN SODIUM 5 MG PO TABS: ORAL_TABLET | ORAL | Status: DC

## 2014-07-29 ENCOUNTER — Ambulatory Visit (INDEPENDENT_AMBULATORY_CARE_PROVIDER_SITE_OTHER): Payer: Medicare HMO

## 2014-07-29 VITALS — BP 142/75 | HR 68 | Temp 97.8°F

## 2014-07-29 DIAGNOSIS — D688 Other specified coagulation defects: Secondary | ICD-10-CM

## 2014-07-29 DIAGNOSIS — Z7901 Long term (current) use of anticoagulants: Secondary | ICD-10-CM

## 2014-07-29 DIAGNOSIS — D6851 Activated protein C resistance: Secondary | ICD-10-CM

## 2014-07-29 LAB — POCT INR: INR: 2.2

## 2014-07-29 NOTE — Patient Instructions (Signed)
Continue to take 5mg  daily except 7.5mg  on Friday. Return to clinic in 6 weeks (09/09/14).

## 2014-07-29 NOTE — Progress Notes (Signed)
Pre visit review using our clinic review tool, if applicable. No additional management support is needed unless otherwise documented below in the visit note. 

## 2014-09-09 ENCOUNTER — Ambulatory Visit: Payer: Medicare HMO

## 2014-09-14 ENCOUNTER — Ambulatory Visit (INDEPENDENT_AMBULATORY_CARE_PROVIDER_SITE_OTHER): Payer: Medicare HMO

## 2014-09-14 ENCOUNTER — Other Ambulatory Visit: Payer: Self-pay

## 2014-09-14 DIAGNOSIS — I2699 Other pulmonary embolism without acute cor pulmonale: Secondary | ICD-10-CM

## 2014-10-01 ENCOUNTER — Telehealth: Payer: Self-pay

## 2014-10-01 ENCOUNTER — Ambulatory Visit: Payer: Medicare HMO

## 2014-10-01 NOTE — Telephone Encounter (Signed)
Pt declined annual wellness visit with [facility].

## 2014-10-03 ENCOUNTER — Other Ambulatory Visit: Payer: Self-pay | Admitting: Family Medicine

## 2014-10-04 ENCOUNTER — Telehealth: Payer: Self-pay | Admitting: *Deleted

## 2014-10-04 NOTE — Telephone Encounter (Signed)
I can't find a whole lot on it on line but what little I did find did not mention anything about there being a problem with coumadin.  There is a problem with bp meds

## 2014-10-04 NOTE — Telephone Encounter (Signed)
Patient's wife called wanting to know if it is OK for him to take L-theanine (would like to take for sleep) with his coumadin.  Please advise

## 2014-10-04 NOTE — Telephone Encounter (Signed)
Notified wife, Remo Lipps.  She stated understanding.

## 2014-10-29 ENCOUNTER — Ambulatory Visit (INDEPENDENT_AMBULATORY_CARE_PROVIDER_SITE_OTHER): Payer: Medicare HMO | Admitting: *Deleted

## 2014-10-29 DIAGNOSIS — D682 Hereditary deficiency of other clotting factors: Secondary | ICD-10-CM | POA: Diagnosis not present

## 2014-10-29 DIAGNOSIS — I2699 Other pulmonary embolism without acute cor pulmonale: Secondary | ICD-10-CM | POA: Diagnosis not present

## 2014-10-29 LAB — POCT INR: INR: 2.5

## 2014-10-29 NOTE — Patient Instructions (Addendum)
Continue to take 5mg  daily except 7.5mg  on Friday. Return to clinic in 4 weeks.

## 2014-10-29 NOTE — Progress Notes (Signed)
Pre visit review using our clinic review tool, if applicable. No additional management support is needed unless otherwise documented below in the visit note. 

## 2014-11-15 ENCOUNTER — Encounter: Payer: Self-pay | Admitting: Family Medicine

## 2014-11-15 ENCOUNTER — Ambulatory Visit (INDEPENDENT_AMBULATORY_CARE_PROVIDER_SITE_OTHER): Payer: Medicare HMO | Admitting: Family Medicine

## 2014-11-15 VITALS — BP 136/76 | HR 69 | Temp 97.9°F | Ht 67.0 in | Wt 179.4 lb

## 2014-11-15 DIAGNOSIS — N400 Enlarged prostate without lower urinary tract symptoms: Secondary | ICD-10-CM

## 2014-11-15 DIAGNOSIS — E785 Hyperlipidemia, unspecified: Secondary | ICD-10-CM

## 2014-11-15 DIAGNOSIS — Z86711 Personal history of pulmonary embolism: Secondary | ICD-10-CM | POA: Diagnosis not present

## 2014-11-15 DIAGNOSIS — Z0000 Encounter for general adult medical examination without abnormal findings: Secondary | ICD-10-CM

## 2014-11-15 DIAGNOSIS — Z23 Encounter for immunization: Secondary | ICD-10-CM

## 2014-11-15 LAB — COMPREHENSIVE METABOLIC PANEL
ALK PHOS: 61 U/L (ref 39–117)
ALT: 16 U/L (ref 0–53)
AST: 23 U/L (ref 0–37)
Albumin: 4.4 g/dL (ref 3.5–5.2)
BUN: 14 mg/dL (ref 6–23)
CO2: 32 mEq/L (ref 19–32)
Calcium: 9.4 mg/dL (ref 8.4–10.5)
Chloride: 102 mEq/L (ref 96–112)
Creatinine, Ser: 0.86 mg/dL (ref 0.40–1.50)
GFR: 91.45 mL/min (ref >60.00)
GLUCOSE: 105 mg/dL — AB (ref 70–99)
POTASSIUM: 4.3 meq/L (ref 3.5–5.1)
SODIUM: 142 meq/L (ref 135–145)
TOTAL PROTEIN: 7.3 g/dL (ref 6.0–8.3)
Total Bilirubin: 1.2 mg/dL (ref 0.2–1.2)

## 2014-11-15 LAB — PSA: PSA: 2.72 ng/mL (ref 0.10–4.00)

## 2014-11-15 LAB — URINALYSIS
Bilirubin Urine: NEGATIVE
HGB URINE DIPSTICK: NEGATIVE
Ketones, ur: NEGATIVE
LEUKOCYTES UA: NEGATIVE
NITRITE: NEGATIVE
Specific Gravity, Urine: 1.02 (ref 1.000–1.030)
Total Protein, Urine: NEGATIVE
URINE GLUCOSE: NEGATIVE
UROBILINOGEN UA: 0.2 (ref 0.0–1.0)
pH: 5.5 (ref 5.0–8.0)

## 2014-11-15 LAB — LIPID PANEL
CHOLESTEROL: 184 mg/dL (ref 0–200)
HDL: 37.5 mg/dL — ABNORMAL LOW (ref >39.00)
LDL Cholesterol: 112 mg/dL — ABNORMAL HIGH (ref 0–99)
NonHDL: 146.15
Total CHOL/HDL Ratio: 5
Triglycerides: 171 mg/dL — ABNORMAL HIGH (ref 0.0–149.0)
VLDL: 34.2 mg/dL (ref 0.0–40.0)

## 2014-11-15 LAB — CBC WITH DIFFERENTIAL/PLATELET
BASOS PCT: 0.6 % (ref 0.0–3.0)
Basophils Absolute: 0 10*3/uL (ref 0.0–0.1)
EOS PCT: 2.5 % (ref 0.0–5.0)
Eosinophils Absolute: 0.1 10*3/uL (ref 0.0–0.7)
HCT: 51 % (ref 39.0–52.0)
Hemoglobin: 17.5 g/dL — ABNORMAL HIGH (ref 13.0–17.0)
LYMPHS ABS: 1.1 10*3/uL (ref 0.7–4.0)
Lymphocytes Relative: 36.6 % (ref 12.0–46.0)
MCHC: 34.3 g/dL (ref 30.0–36.0)
MCV: 98.6 fl (ref 78.0–100.0)
MONO ABS: 0.3 10*3/uL (ref 0.1–1.0)
Monocytes Relative: 10.6 % (ref 3.0–12.0)
NEUTROS PCT: 49.7 % (ref 43.0–77.0)
Neutro Abs: 1.5 10*3/uL (ref 1.4–7.7)
Platelets: 146 10*3/uL — ABNORMAL LOW (ref 150.0–400.0)
RBC: 5.17 Mil/uL (ref 4.22–5.81)
RDW: 13.1 % (ref 11.5–15.5)
WBC: 3 10*3/uL — ABNORMAL LOW (ref 4.0–10.5)

## 2014-11-15 LAB — MICROALBUMIN / CREATININE URINE RATIO
Creatinine,U: 119 mg/dL
Microalb Creat Ratio: 0.6 mg/g (ref 0.0–30.0)
Microalb, Ur: 0.7 mg/dL (ref 0.0–1.9)

## 2014-11-15 NOTE — Progress Notes (Signed)
Subjective:   Shawn [name] is a 80 y.o. male who presents for Medicare Annual/Subsequent preventive examination.  Review of Systems:   Review of Systems  Constitutional: Negative for activity change, appetite change and fatigue.  HENT: Negative for hearing loss, congestion, tinnitus and ear discharge.   Eyes: Negative for visual disturbance (see optho q1y -- vision corrected to 20/20 with glasses).  Respiratory: Negative for cough, chest tightness and shortness of breath.   Cardiovascular: Negative for chest pain, palpitations and leg swelling.  Gastrointestinal: Negative for abdominal pain, diarrhea, constipation and abdominal distention.  Genitourinary: Negative for urgency, frequency, decreased urine volume and difficulty urinating.  Musculoskeletal: Negative for back pain, arthralgias and gait problem.  Skin: Negative for color change, pallor and rash.  Neurological: Negative for dizziness, light-headedness, numbness and headaches.  Hematological: Negative for adenopathy. Does not bruise/bleed easily.  Psychiatric/Behavioral: Negative for suicidal ideas, confusion, sleep disturbance, self-injury, dysphoric mood, decreased concentration and agitation.  Pt is able to read and write and can do all ADLs No risk for falling No abuse/ violence in home           Objective:    Vitals: BP 136/76 mmHg  Pulse 69  Temp(Src) 97.9 F (36.6 C) (Oral)  Ht '5\' 7"'  (1.702 m)  Wt 179 lb 6.4 oz (81.375 kg)  BMI 28.09 kg/m2  SpO2 97% BP 136/76 mmHg  Pulse 69  Temp(Src) 97.9 F (36.6 C) (Oral)  Ht '5\' 7"'  (1.702 m)  Wt 179 lb 6.4 oz (81.375 kg)  BMI 28.09 kg/m2  SpO2 97% General appearance: alert, cooperative, appears stated age and no distress Head: Normocephalic, without obvious abnormality, atraumatic Eyes: conjunctivae/corneas clear. PERRL, EOM's intact. Fundi benign. Ears: normal TM's and external ear canals both ears Nose: Nares normal. Septum midline. Mucosa  normal. No drainage or sinus tenderness. Throat: lips, mucosa, and tongue normal; teeth and gums normal Neck: no adenopathy, no carotid bruit, no JVD, supple, symmetrical, trachea midline and thyroid not enlarged, symmetric, no tenderness/mass/nodules Back: symmetric, no curvature. ROM normal. No CVA tenderness. Lungs: clear to auscultation bilaterally Chest wall: no tenderness Heart: S1, S2 normal-- + murmur Abdomen: soft, non-tender; bowel sounds normal; no masses,  no organomegaly Male genitalia: normal, abnormal findings: inguinal hernia present right Rectal: soft brown guaiac negative stool noted and enlarged prostate, smooth Extremities: extremities normal, atraumatic, no cyanosis or edema Pulses: 2+ and symmetric Skin: Skin color, texture, turgor normal. No rashes or lesions Lymph nodes: Cervical, supraclavicular, and axillary nodes normal. Neurologic: Alert and oriented X 3, normal strength and tone. Normal symmetric reflexes. Normal coordination and gait Psych- no depression, no anxiety Tobacco History  Smoking status  . Never Smoker   Smokeless tobacco  . Never Used     Counseling given: Not Answered   Past Medical History  Diagnosis Date  . Hyperlipidemia   . Dysphagia   . Cardiac murmur     mild aortic sclerosis  . Osteoarthritis   . History of blood clots   . Factor V Leiden mutation ([place])   . Colon polyps     Hyperplastic  . Hemorrhoids   . Diverticulosis   . Allergy   . Cataract     left eye  . Clotting disorder ([place])     Factor v Leiden factor mutation   Past Surgical History  Procedure Laterality Date  . Appendectomy    . Vasectomy    . Tonsillectomy    . Inguinal hernia repair    .  Cataract extraction  02/09/2007    right eye  . Varicose vein surgery     Family History  Problem Relation Age of Onset  . Lung cancer Father   . Heart attack Mother 21  . Heart disease Mother     chf  . Arthritis Mother   . Colon cancer Neg Hx   . Esophageal  cancer Neg Hx   . Rectal cancer Neg Hx   . Stomach cancer Neg Hx    History  Sexual Activity  . Sexual Activity:  . Partners: Female    Outpatient Encounter Prescriptions as of 11/15/2014  Medication Sig  . Cholecalciferol ([employer]S VITAMIN D3) 1000 UNITS capsule Take 1,000 Units by mouth daily.    . Coenzyme Q10 (COQ10) 100 MG CAPS Take 1 capsule by mouth daily.    . Flax OIL Take 2,000 mg by mouth daily.  Marland Kitchen Hyaluronic Acid-Vitamin C (HYALURONIC ACID PO) Take 2 tablets by mouth daily. (200 mg)  . L-Theanine 100 MG CAPS Take 1 capsule by mouth daily.  . Multiple Vitamin (MULTIVITAMIN) tablet Take 1 tablet by mouth daily.    . niacin 500 MG tablet Take 500 mg by mouth at bedtime.  . Omega-3 Fatty Acids (FISH OIL) 500 MG CAPS Take 4 capsules by mouth daily.  . saw palmetto 160 MG capsule Take 320 mg by mouth daily.   Marland Kitchen warfarin (COUMADIN) 5 MG tablet TAKE ONE TABLET BY MOUTH ONCE DAILY AS DIRECTED  . [DISCONTINUED] Psyllium (WAL-MUCIL) 100 % POWD Take by mouth daily.    . [DISCONTINUED] fish oil-omega-3 fatty acids 1000 MG capsule Take 1 g by mouth. 3 BY MOUTH DAILY   No facility-administered encounter medications on file as of 11/15/2014.    Activities of Daily Living In your present state of health, do you have any difficulty performing the following activities: 11/15/2014  Hearing? N  Vision? N  Difficulty concentrating or making decisions? N  Walking or climbing stairs? N  Dressing or bathing? N  Doing errands, shopping? N    Patient Care Team: Rosalita Chessman, DO as PCP - General   Assessment:    CPE Exercise Activities and Dietary recommendations---- con't walking      Goals    None     Fall Risk Fall Risk  11/15/2014 11/13/2013 10/30/2012  Falls in the past year? No No No   Depression Screen PHQ 2/9 Scores 11/15/2014 11/13/2013 10/30/2012 10/29/2011  PHQ - 2 Score 0 0 0 0    Cognitive Testing mmse 30/30  Immunization History  Administered Date(s) Administered    . Influenza Split 11/17/2010, 11/29/2011  . Influenza Whole 11/12/2007, 11/17/2008, 11/09/2009  . Influenza, High Dose Seasonal PF 11/15/2014  . Influenza,inj,Quad PF,36+ Mos 10/30/2012, 11/13/2013  . Pneumococcal Conjugate-13 12/15/2013  . Pneumococcal Polysaccharide-23 09/04/2005, 01/21/2012  . Td 01/18/1999, 10/13/2008  . Tdap 01/21/2012  . Zoster 11/27/2006   Screening Tests Health Maintenance  Topic Date Due  . INFLUENZA VACCINE  09/06/2015  . TETANUS/TDAP  01/20/2022  . ZOSTAVAX  Completed  . PNA vac Low Risk Adult  Completed      Plan:   see AVS During the course of the visit the patient was educated and counseled about the following appropriate screening and preventive services:   Vaccines to include Pneumoccal, Influenza, Hepatitis B, Td, Zostavax, HCV  Electrocardiogram  Cardiovascular Disease  Colorectal cancer screening  Diabetes screening  Prostate Cancer Screening  Glaucoma screening  Nutrition counseling   Smoking cessation counseling  Patient Instructions (the written plan) was given to the patient.   1. Need for immunization against influenza   - Flu vaccine HIGH DOSE PF (Fluzone High dose)  2. History of pulmonary embolism On coumadin  - Comp Met (CMET) - CBC with Differential/Platelet - Lipid panel - Microalbumin / creatinine urine ratio - POCT urinalysis dipstick - PSA  3. Hyperlipidemia Check labs - Comp Met (CMET) - CBC with Differential/Platelet - Lipid panel - Microalbumin / creatinine urine ratio - POCT urinalysis dipstick - PSA  4. BPH (benign prostatic hypertrophy)   - PSA  5. Preventative health care See avs ghm utd Check labs   Garnet Koyanagi, DO  11/15/2014

## 2014-11-15 NOTE — Progress Notes (Signed)
Pre visit review using our clinic review tool, if applicable. No additional management support is needed unless otherwise documented below in the visit note. 

## 2014-11-15 NOTE — Patient Instructions (Signed)

## 2014-11-24 ENCOUNTER — Encounter: Payer: Self-pay | Admitting: Gastroenterology

## 2014-11-24 DIAGNOSIS — D2339 Other benign neoplasm of skin of other parts of face: Secondary | ICD-10-CM | POA: Diagnosis not present

## 2014-11-24 DIAGNOSIS — L821 Other seborrheic keratosis: Secondary | ICD-10-CM | POA: Diagnosis not present

## 2014-11-24 DIAGNOSIS — D2362 Other benign neoplasm of skin of left upper limb, including shoulder: Secondary | ICD-10-CM | POA: Diagnosis not present

## 2014-11-24 DIAGNOSIS — D485 Neoplasm of uncertain behavior of skin: Secondary | ICD-10-CM | POA: Diagnosis not present

## 2014-11-24 DIAGNOSIS — L570 Actinic keratosis: Secondary | ICD-10-CM | POA: Diagnosis not present

## 2014-12-03 ENCOUNTER — Ambulatory Visit (INDEPENDENT_AMBULATORY_CARE_PROVIDER_SITE_OTHER): Payer: Medicare HMO

## 2014-12-03 DIAGNOSIS — I2699 Other pulmonary embolism without acute cor pulmonale: Secondary | ICD-10-CM | POA: Diagnosis not present

## 2014-12-03 LAB — POCT INR: INR: 2.4

## 2014-12-03 NOTE — Patient Instructions (Signed)
Continue to take 5mg  daily except 7.5mg  on Friday. Return to clinic in 4 weeks.

## 2014-12-03 NOTE — Progress Notes (Signed)
Pre visit review using our clinic review tool, if applicable. No additional management support is needed unless otherwise documented below in the visit note.  Patient in for INR check. INR = 2.4 which is within prescribed goal. Will continue medication dosage as he has been taking and return in 4 weeks for re-check.

## 2014-12-29 ENCOUNTER — Ambulatory Visit (INDEPENDENT_AMBULATORY_CARE_PROVIDER_SITE_OTHER): Payer: Medicare HMO

## 2014-12-29 ENCOUNTER — Other Ambulatory Visit: Payer: Self-pay

## 2014-12-29 DIAGNOSIS — I2699 Other pulmonary embolism without acute cor pulmonale: Secondary | ICD-10-CM | POA: Diagnosis not present

## 2014-12-29 MED ORDER — WARFARIN SODIUM 5 MG PO TABS: ORAL_TABLET | ORAL | Status: DC

## 2014-12-29 NOTE — Progress Notes (Signed)
Pre visit review using our clinic review tool, if applicable. No additional management support is needed unless otherwise documented below in the visit note.  Patient in for INR check =2.4 Goal 2.0-3.0

## 2014-12-29 NOTE — Patient Instructions (Signed)
Continue to take 5mg  daily except 7.5mg  on Friday. Return to clinic in 4 weeks.    Appointment scheduled for December 27 @10 :00 am per patient request.

## 2015-01-10 ENCOUNTER — Telehealth: Payer: Self-pay | Admitting: Family Medicine

## 2015-01-10 NOTE — Telephone Encounter (Signed)
Caller name: Self   Can be reached: [phone]   Reason for call: Patient received letter from [facility] that he needs to schedule a Colonoscopy and patient wants to know if it is safe for him at his age.

## 2015-01-10 NOTE — Telephone Encounter (Signed)
Last CCS:  11/09/11---results: Small internal hemorrhoids; Dr. Olevia Perches  Per report:  Repeat exam: for no recall due to age.    Letter sent out advised that patient schedule an office visit.  Pt was advised to call office to schedule or decline office visit.  Pt stated understanding and agreed.

## 2015-02-01 ENCOUNTER — Ambulatory Visit (INDEPENDENT_AMBULATORY_CARE_PROVIDER_SITE_OTHER): Payer: Medicare HMO

## 2015-02-01 DIAGNOSIS — Z23 Encounter for immunization: Secondary | ICD-10-CM

## 2015-02-01 DIAGNOSIS — I2699 Other pulmonary embolism without acute cor pulmonale: Secondary | ICD-10-CM | POA: Diagnosis not present

## 2015-02-01 LAB — POCT INR: INR: 2.9

## 2015-02-01 NOTE — Patient Instructions (Signed)
Continue to take 5mg  daily except 7.5mg  on Friday. Return to clinic in 4 weeks. Patient request January 31 appointment.

## 2015-02-01 NOTE — Progress Notes (Signed)
Pre visit review using our clinic review tool, if applicable. No additional management support is needed unless otherwise documented below in the visit note.  Patient in for INR check INR today = 2.9 last visit 2.4  Patient goal 2.0-3.0  Patient advised to continue same dose of Coumadin 5 mg daily except for Friday's take 7.5mg  Patient will return in 5 weeks (his preference ) January 31,2017

## 2015-02-22 DIAGNOSIS — H26492 Other secondary cataract, left eye: Secondary | ICD-10-CM | POA: Diagnosis not present

## 2015-02-22 DIAGNOSIS — Z961 Presence of intraocular lens: Secondary | ICD-10-CM | POA: Diagnosis not present

## 2015-03-08 ENCOUNTER — Ambulatory Visit (INDEPENDENT_AMBULATORY_CARE_PROVIDER_SITE_OTHER): Payer: Medicare HMO

## 2015-03-08 DIAGNOSIS — Z5181 Encounter for therapeutic drug level monitoring: Secondary | ICD-10-CM

## 2015-03-08 DIAGNOSIS — I2782 Chronic pulmonary embolism: Secondary | ICD-10-CM | POA: Diagnosis not present

## 2015-03-08 LAB — POCT INR: INR: 2.7

## 2015-03-08 NOTE — Progress Notes (Signed)
Pre visit review using our clinic review tool, if applicable. No additional management support is needed unless otherwise documented below in the visit note.  Patient in for INR check. INR = 2.7 Goal 2.0-3.0. Patient to return in 5 weeks . Apointment scheduled for April 13 2015  No complaints voiced.

## 2015-03-08 NOTE — Patient Instructions (Signed)
Continue to take 5mg  daily except 7.5mg  on Friday. Return to clinic in 5 weeks. Appointment scheduled for April 13 2015 at 10:30 a.m.

## 2015-03-17 ENCOUNTER — Ambulatory Visit (INDEPENDENT_AMBULATORY_CARE_PROVIDER_SITE_OTHER): Payer: Medicare HMO | Admitting: Cardiology

## 2015-03-17 ENCOUNTER — Encounter: Payer: Self-pay | Admitting: Cardiology

## 2015-03-17 VITALS — BP 122/78 | HR 64 | Ht 68.0 in | Wt 184.2 lb

## 2015-03-17 DIAGNOSIS — I359 Nonrheumatic aortic valve disorder, unspecified: Secondary | ICD-10-CM | POA: Diagnosis not present

## 2015-03-17 DIAGNOSIS — I350 Nonrheumatic aortic (valve) stenosis: Secondary | ICD-10-CM

## 2015-03-17 NOTE — Patient Instructions (Signed)
Medication Instructions:   NO CHANGE  Testing/Procedures:  Your physician has requested that you have an echocardiogram. Echocardiography is a painless test that uses sound waves to create images of your heart. It provides your doctor with information about the size and shape of your heart and how well your heart's chambers and valves are working. This procedure takes approximately one hour. There are no restrictions for this procedure.    Follow-Up:  Your physician wants you to follow-up in: 2 YEARS WITH DR Bernie will receive a reminder letter in the mail two months in advance. If you don't receive a letter, please call our office to schedule the follow-up appointment.   If you need a refill on your cardiac medications before your next appointment, please call your pharmacy.

## 2015-03-17 NOTE — Progress Notes (Signed)
Cardiology Office Note   Date:  03/17/2015   ID:  Shawn [name], DOB 10-04-1934, MRN [id number]  PCP:  Garnet Koyanagi, DO  Cardiologist:   Minus Breeding, MD   Chief Complaint  Patient presents with  . Edema      History of Present Illness: Shawn [name] is a 81 y.o. male who presents for evaluation of lower extremity edema.  I saw him about 2 1/2 years ago for evaluation of a heart murmur.  He had mild AS.  Since I last saw him he has done well.  However, he has had some increased lower extremity edema.  He has had vein stripping from the right leg in the past.  The patient denies any new symptoms such as chest discomfort, neck or arm discomfort. There has been no new shortness of breath, PND or orthopnea. There have been no reported palpitations, presyncope or syncope.  He doesn't know if the edema is less in the morning.    Past Medical History  Diagnosis Date  . Hyperlipidemia   . Dysphagia   . Cardiac murmur     mild aortic sclerosis  . Osteoarthritis   . History of blood clots   . Factor V Leiden mutation ([place])   . Colon polyps     Hyperplastic  . Hemorrhoids   . Diverticulosis   . Allergy   . Cataract     left eye  . Clotting disorder ([place])     Factor v Leiden factor mutation    Past Surgical History  Procedure Laterality Date  . Appendectomy    . Vasectomy    . Tonsillectomy    . Inguinal hernia repair    . Cataract extraction  02/09/2007    right eye  . Varicose vein surgery       Current Outpatient Prescriptions  Medication Sig Dispense Refill  . Cholecalciferol ([employer]S VITAMIN D3) 1000 UNITS capsule Take 1,000 Units by mouth daily.      . Coenzyme Q10 (COQ10) 100 MG CAPS Take 1 capsule by mouth daily.      . Flax OIL Take 2,000 mg by mouth daily.    Marland Kitchen Hyaluronic Acid-Vitamin C (HYALURONIC ACID PO) Take 2 tablets by mouth daily. (200 mg)    . L-Theanine 100 MG CAPS Take 1 capsule by mouth daily.    . Multiple Vitamin  (MULTIVITAMIN) tablet Take 1 tablet by mouth daily.      . niacin 500 MG tablet Take 500 mg by mouth at bedtime.    . Omega-3 Fatty Acids (FISH OIL) 500 MG CAPS Take 4 capsules by mouth daily.    Marland Kitchen warfarin (COUMADIN) 5 MG tablet TAKE ONE TABLET BY MOUTH ONCE DAILY AS DIRECTED 90 tablet 0  . saw palmetto 160 MG capsule Take 320 mg by mouth daily.      No current facility-administered medications for this visit.    Allergies:   Niacin and Pravastatin    Social History:  The patient  reports that he has never smoked. He has never used smokeless tobacco. He reports that he does not drink alcohol or use illicit drugs.   Family History:  The patient's family history includes Arthritis in his mother; Heart attack (age of onset: 71) in his mother; Heart disease in his mother; Lung cancer in his father. There is no history of Colon cancer, Esophageal cancer, Rectal cancer, or Stomach cancer.    ROS:  Please see the history of present  illness.   Otherwise, review of systems are positive for none.   All other systems are reviewed and negative.    PHYSICAL EXAM: VS:  BP 122/78 mmHg  Pulse 64  Ht 5\' 8"  (1.727 m)  Wt 184 lb 3 oz (83.547 kg)  BMI 28.01 kg/m2 , BMI Body mass index is 28.01 kg/(m^2). GENERAL:  Well appearing HEENT:  Pupils equal round and reactive, fundi not visualized, oral mucosa unremarkable NECK:  No jugular venous distention, waveform within normal limits, carotid upstroke brisk and symmetric, no bruits, no thyromegaly LYMPHATICS:  No cervical, inguinal adenopathy LUNGS:  Clear to auscultation bilaterally BACK:  No CVA tenderness CHEST:  Unremarkable HEART:  PMI not displaced or sustained,S1 and S2 within normal limits, no S3, no S4, no clicks, no rubs, 3/6 early peaking systolic murmur radiating out the aortic outflow tract.  No diastolic murmurs ABD:  Flat, positive bowel sounds normal in frequency in pitch, no bruits, no rebound, no guarding, no midline pulsatile mass, no  hepatomegaly, no splenomegaly EXT:  2 plus pulses throughout, mild bilateral leg edema, no cyanosis no clubbing SKIN:  No rashes no nodules NEURO:  Cranial nerves II through XII grossly intact, motor grossly intact throughout PSYCH:  Cognitively intact, oriented to person place and time    EKG:  EKG is ordered today. The ekg ordered today demonstrates NSR, rate 65, no acute ST T wave changes.     Recent Labs: 11/15/2014: ALT 16; BUN 14; Creatinine, Ser 0.86; Hemoglobin 17.5*; Platelets 146.0*; Potassium 4.3; Sodium 142    Lipid Panel    Component Value Date/Time   CHOL 184 11/15/2014 0914   TRIG 171.0* 11/15/2014 0914   HDL 37.50* 11/15/2014 0914   CHOLHDL 5 11/15/2014 0914   VLDL 34.2 11/15/2014 0914   LDLCALC 112* 11/15/2014 0914   LDLDIRECT 86.0 06/17/2014 1008      Wt Readings from Last 3 Encounters:  03/17/15 184 lb 3 oz (83.547 kg)  11/15/14 179 lb 6.4 oz (81.375 kg)  06/17/14 179 lb 12.8 oz (81.557 kg)      Other studies Reviewed: Additional studies/ records that were reviewed today include: Previous echo. Review of the above records demonstrates:  Please see elsewhere in the note.     ASSESSMENT AND PLAN:  AS:  I suspect that this is mild. However due for follow-up  I will arrange this.  EDEMA:  I don't suspect this is secondary heart failure. This is probably venous insufficiency.We talked about conservative measures to include compression stockings.   Current medicines are reviewed at length with the patient today.  The patient does not have concerns regarding medicines.  The following changes have been made:  no change  Labs/ tests ordered today include:   Orders Placed This Encounter  Procedures  . EKG 12-Lead  . Echocardiogram     Disposition:   FU with me in two years.     Signed, Minus Breeding, MD  03/17/2015 4:25 PM    [facility]

## 2015-03-20 ENCOUNTER — Other Ambulatory Visit: Payer: Self-pay | Admitting: Family Medicine

## 2015-04-01 ENCOUNTER — Other Ambulatory Visit: Payer: Self-pay

## 2015-04-01 ENCOUNTER — Ambulatory Visit (HOSPITAL_COMMUNITY): Payer: Medicare HMO | Attending: Cardiovascular Disease

## 2015-04-01 DIAGNOSIS — I350 Nonrheumatic aortic (valve) stenosis: Secondary | ICD-10-CM | POA: Diagnosis not present

## 2015-04-01 DIAGNOSIS — E785 Hyperlipidemia, unspecified: Secondary | ICD-10-CM | POA: Insufficient documentation

## 2015-04-01 DIAGNOSIS — I352 Nonrheumatic aortic (valve) stenosis with insufficiency: Secondary | ICD-10-CM | POA: Diagnosis not present

## 2015-04-01 DIAGNOSIS — I359 Nonrheumatic aortic valve disorder, unspecified: Secondary | ICD-10-CM | POA: Diagnosis not present

## 2015-04-13 ENCOUNTER — Ambulatory Visit (INDEPENDENT_AMBULATORY_CARE_PROVIDER_SITE_OTHER): Payer: Medicare HMO | Admitting: *Deleted

## 2015-04-13 DIAGNOSIS — Z86718 Personal history of other venous thrombosis and embolism: Secondary | ICD-10-CM | POA: Diagnosis not present

## 2015-04-13 LAB — POCT INR: INR: 2.3

## 2015-04-13 NOTE — Patient Instructions (Signed)
Continue to take 5mg  daily except 7.5mg  on Friday. Return to clinic in 5 weeks.

## 2015-04-13 NOTE — Progress Notes (Signed)
Pre visit review using our clinic review tool, if applicable. No additional management support is needed unless otherwise documented below in the visit note.  INR today 2.3. Pt findings negative.  Continue to take 5mg  daily except 7.5mg  on Friday. Return to clinic in 5 weeks.  Next appt: 05/18/15  Dorrene German, RN

## 2015-04-21 DIAGNOSIS — R69 Illness, unspecified: Secondary | ICD-10-CM | POA: Diagnosis not present

## 2015-05-18 ENCOUNTER — Ambulatory Visit (INDEPENDENT_AMBULATORY_CARE_PROVIDER_SITE_OTHER): Payer: Medicare HMO | Admitting: *Deleted

## 2015-05-18 DIAGNOSIS — Z86718 Personal history of other venous thrombosis and embolism: Secondary | ICD-10-CM

## 2015-05-18 LAB — POCT INR: INR: 2.4

## 2015-05-18 NOTE — Progress Notes (Signed)
Pre visit review using our clinic review tool, if applicable. No additional management support is needed unless otherwise documented below in the visit note.  INR today 2.4.  Continue to take 5mg  daily except 7.5mg  on Friday. Return to clinic in 5 weeks.  Pt aware of instructions and verbalized understanding.  Dorrene German, RN

## 2015-05-18 NOTE — Patient Instructions (Signed)
Continue to take 5mg  daily except 7.5mg  on Friday. Return to clinic in 5 weeks.

## 2015-06-12 ENCOUNTER — Other Ambulatory Visit: Payer: Self-pay | Admitting: Family Medicine

## 2015-06-22 ENCOUNTER — Ambulatory Visit (INDEPENDENT_AMBULATORY_CARE_PROVIDER_SITE_OTHER): Payer: Medicare HMO | Admitting: *Deleted

## 2015-06-22 DIAGNOSIS — Z86711 Personal history of pulmonary embolism: Secondary | ICD-10-CM

## 2015-06-22 LAB — POCT INR: INR: 2.2

## 2015-06-22 NOTE — Progress Notes (Signed)
Pre visit review using our clinic review tool, if applicable. No additional management support is needed unless otherwise documented below in the visit note.  INR today 2.2.   Continue to take 5mg  daily except 7.5mg  on Friday. Return to clinic in 5 weeks.  Next appt: 07/27/15  Dorrene German, RN

## 2015-06-22 NOTE — Patient Instructions (Signed)
Continue to take 5mg  daily except 7.5mg  on Friday. Return to clinic in 5 weeks.

## 2015-07-27 ENCOUNTER — Ambulatory Visit (INDEPENDENT_AMBULATORY_CARE_PROVIDER_SITE_OTHER): Payer: Medicare HMO | Admitting: Behavioral Health

## 2015-07-27 DIAGNOSIS — Z86711 Personal history of pulmonary embolism: Secondary | ICD-10-CM | POA: Diagnosis not present

## 2015-07-27 LAB — POCT INR: INR: 1.9

## 2015-07-27 NOTE — Progress Notes (Signed)
Pre visit review using our clinic review tool, if applicable. No additional management support is needed unless otherwise documented below in the visit note.  Patient presents in office for INR check. Today's reading was 1.9. He voiced no changes with medications or diet, besides eating out more this past weekend than normally. Patient reported consuming multiple salads, which consisted of iceberg lettuce. Informed the patient that food selections that are higher in vitamin K, like that of the above, can work against Coumadin and lower your INR if consumed more than usual, therefore it's important to be consistent with greens intake each week.   Per Dr. Lorelei Pont: Continue taking Coumadin 5 mg daily, except 7.5 mg on Fridays. Return to clinic in 1 week. Patient was made aware of the provider's instructions. He verbalized understanding and did not have any questions or concerns prior to leaving the nurse visit.  Next appointment scheduled for 08/03/15 at 10:45 AM.

## 2015-07-27 NOTE — Progress Notes (Signed)
He has been stable and therapeutic on the same dose of coumadin for 2 + years. Given that his INR is close to therapeutic will make no changes and recheck in 1 week

## 2015-07-27 NOTE — Patient Instructions (Signed)
Per Dr. Lorelei Pont: Continue taking Coumadin 5mg  daily, except 7.5mg  on Friday. Return to clinic in 1 week.

## 2015-08-03 ENCOUNTER — Ambulatory Visit (INDEPENDENT_AMBULATORY_CARE_PROVIDER_SITE_OTHER): Payer: Medicare HMO

## 2015-08-03 DIAGNOSIS — Z86711 Personal history of pulmonary embolism: Secondary | ICD-10-CM

## 2015-08-03 LAB — POCT INR: INR: 1.9

## 2015-08-03 NOTE — Patient Instructions (Addendum)
Per Dr. Lorelei Pont: Continue taking Coumadin 5 mg daily, except 7.5 mg on Wednesdays and Fridays. Return to clinic in 2 weeks.

## 2015-08-03 NOTE — Progress Notes (Signed)
Pre visit review using our clinic tool,if applicable. No additional management support is needed unless otherwise documented below in the visit note.   Patient in for INR check today. Per order from Dr. Janett Billow Copland who was covering for Dr. Etter Sjogren on last visit. Patients INR today was 1.9. Patient admitted to an increase in green leafy vegaeables. Denies all other patient findings. New dosage instructions given to patient.  Patient to return in 2 weeks for repeat INR.  Appointment scheduled.

## 2015-08-08 ENCOUNTER — Telehealth: Payer: Self-pay | Admitting: Cardiology

## 2015-08-08 NOTE — Telephone Encounter (Signed)
Wife voiced understanding of recommendations. I advised to call Dr. Nonda Lou office to see if they wanted to recheck coumadin sooner than next Thursday, she will call.  Aware if symptoms return to call 911 so that firefighters/EMT can conduct FAST assessment. She wants to know if advised to see Dr. Percival Spanish. I reminded her he is out of office this week.  Will return call on Wednesday with extender visit options.

## 2015-08-08 NOTE — Telephone Encounter (Signed)
Spoke to wife.  Friday afternoon before dinner, patient had episode of near syncope, visual changes where "colors were wrong", heavy heart beat and bad headache. She notes BP 110/62, HR 70 when checked at the time. He laid down and notes symptoms resolved in about 30 mins. Patient did not go to ER.  Notes Dr. Nonda Lou office manages his coumadin and he was recently subtherapeutic. Their office is working with patient "trying to get that back in range".   Advised caller that if this recurs, patient should seek urgent medical attention - best recommendation is to call 911/EMS or go to the emergency room.  Since Dr. Nonda Lou office manages coumadin, not certain if he needs to follow up with [place] urgently. He has a check scheduled in 1.5 weeks for this. Wife had requested patient see Dr. Percival Spanish - currently out of office.  Routed to Dr. Debara Pickett ([facility]) for advice.

## 2015-08-08 NOTE — Telephone Encounter (Signed)
Agree - those symptoms warrant ER visit if they return.  Dr Lemmie Evens

## 2015-08-08 NOTE — Telephone Encounter (Signed)
New Message  Pt wife calling to speak w/ RN- pt c/o feeling like he was about to pass out- pt wife stated he did NOT lose conscienceness, pt c/o distorted vision, HR rapid , and headdache- all occurred for about 30 minutes on Fri 6/30. Please call back and discuss.

## 2015-08-10 ENCOUNTER — Encounter (INDEPENDENT_AMBULATORY_CARE_PROVIDER_SITE_OTHER): Payer: Self-pay

## 2015-08-10 ENCOUNTER — Encounter: Payer: Self-pay | Admitting: Physician Assistant

## 2015-08-10 ENCOUNTER — Ambulatory Visit (INDEPENDENT_AMBULATORY_CARE_PROVIDER_SITE_OTHER): Payer: Medicare HMO | Admitting: Physician Assistant

## 2015-08-10 ENCOUNTER — Ambulatory Visit (INDEPENDENT_AMBULATORY_CARE_PROVIDER_SITE_OTHER): Payer: Medicare HMO | Admitting: Family

## 2015-08-10 ENCOUNTER — Encounter: Payer: Self-pay | Admitting: Family

## 2015-08-10 VITALS — BP 110/70 | HR 73 | Ht 68.0 in | Wt 181.2 lb

## 2015-08-10 VITALS — BP 118/70 | HR 70 | Temp 97.4°F | Resp 14 | Ht 68.0 in | Wt 181.2 lb

## 2015-08-10 DIAGNOSIS — D682 Hereditary deficiency of other clotting factors: Secondary | ICD-10-CM

## 2015-08-10 DIAGNOSIS — N390 Urinary tract infection, site not specified: Secondary | ICD-10-CM | POA: Diagnosis not present

## 2015-08-10 DIAGNOSIS — I359 Nonrheumatic aortic valve disorder, unspecified: Secondary | ICD-10-CM

## 2015-08-10 DIAGNOSIS — R55 Syncope and collapse: Secondary | ICD-10-CM

## 2015-08-10 DIAGNOSIS — I350 Nonrheumatic aortic (valve) stenosis: Secondary | ICD-10-CM | POA: Diagnosis not present

## 2015-08-10 DIAGNOSIS — R791 Abnormal coagulation profile: Secondary | ICD-10-CM | POA: Diagnosis not present

## 2015-08-10 DIAGNOSIS — H5350 Unspecified color vision deficiencies: Secondary | ICD-10-CM

## 2015-08-10 LAB — BASIC METABOLIC PANEL
BUN: 15 mg/dL (ref 7–25)
CALCIUM: 9.6 mg/dL (ref 8.6–10.3)
CO2: 32 mmol/L — AB (ref 20–31)
Chloride: 102 mmol/L (ref 98–110)
Creat: 1.06 mg/dL (ref 0.70–1.18)
Glucose, Bld: 79 mg/dL (ref 65–99)
Potassium: 4 mmol/L (ref 3.5–5.3)
SODIUM: 140 mmol/L (ref 135–146)

## 2015-08-10 LAB — CBC
HCT: 48.8 % (ref 38.5–50.0)
HEMOGLOBIN: 17 g/dL (ref 13.2–17.1)
MCH: 34.1 pg — ABNORMAL HIGH (ref 27.0–33.0)
MCHC: 34.8 g/dL (ref 32.0–36.0)
MCV: 98 fL (ref 80.0–100.0)
MPV: 10.1 fL (ref 7.5–12.5)
Platelets: 145 10*3/uL (ref 140–400)
RBC: 4.98 MIL/uL (ref 4.20–5.80)
RDW: 13.4 % (ref 11.0–15.0)
WBC: 4.3 10*3/uL (ref 3.8–10.8)

## 2015-08-10 LAB — PROTIME-INR
INR: 2.2 — AB
PROTHROMBIN TIME: 22.2 s — AB (ref 9.0–11.5)

## 2015-08-10 NOTE — Patient Instructions (Signed)
Please complete lab work prior to leaving. Call us if you have any recurrent symptoms. Follow up with Dr. Etter Sjogren in 3 months.

## 2015-08-10 NOTE — Progress Notes (Signed)
Subjective:    Patient ID: Shawn [name], male    DOB: 1934-04-07, 81 y.o.   MRN: [id number]  HPI   [name] is a 81 yr old male who presents today with chief complaint of fatigue.    Reports that on on Friday he and his wife went out for a walk in the early AM and  "his head just did not feel right." notes that his vision "wasn't right." Felt like he was going to pass out. Had a HA. Laid on the couch. Took some tylenol, which helped briefly.  Wife took BP 112/62 when he was not feeling well.  He denies hx of  numbness/weakness/slurred speech during this episode.  He reports that he began to feel better about 30 minutes later.  Today he feels well. Vision has returned to normal.    Review of Systems  Respiratory: Negative for shortness of breath.   Cardiovascular: Negative for chest pain and leg swelling.   See HPI  Past Medical History  Diagnosis Date  . Hyperlipidemia   . Dysphagia   . Cardiac murmur     mild aortic sclerosis  . Osteoarthritis   . History of blood clots   . Factor V Leiden mutation ([place])   . Colon polyps     Hyperplastic  . Hemorrhoids   . Diverticulosis   . Allergy   . Cataract     left eye  . Clotting disorder ([place])     Factor v Leiden factor mutation     Social History   Social History  . Marital Status: Married    Spouse Name: N/A  . Number of Children: 4  . 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 Smoking status: Never Smoker   . Smokeless tobacco: Never Used  . Alcohol Use: No  . Drug Use: No  . Sexual Activity:    Partners: Female   Other Topics Concern  . Not on file   Social History Narrative   Daily caffeine    Exercise-- walks daily with dog   Lives with wife.     Past Surgical History  Procedure Laterality Date  . Appendectomy    . Vasectomy    . Tonsillectomy    . Inguinal hernia repair    . Cataract extraction  02/09/2007   right eye  . Varicose vein surgery      Family History  Problem Relation Age of Onset  . Lung cancer Father   . Heart attack Mother 86  . Heart disease Mother     chf  . Arthritis Mother   . Colon cancer Neg Hx   . Esophageal cancer Neg Hx   . Rectal cancer Neg Hx   . Stomach cancer Neg Hx     Allergies  Allergen Reactions  . Niacin Rash    "My whole body turned red on the prescription dose" He takes the OTC medication  . Pravastatin Other (See Comments)    Disorientation    Current Outpatient Prescriptions on File Prior to Visit  Medication Sig Dispense Refill  . Cholecalciferol ([employer] VITAMIN D3) 1000 UNITS capsule Take 1,000 Units by mouth daily.      . Coenzyme Q10 (COQ10) 100 MG CAPS Take 1 capsule by mouth daily.      . Flax OIL Take 2,000 mg by mouth daily.    Marland Kitchen Hyaluronic Acid-Vitamin C (HYALURONIC  ACID PO) Take 2 tablets by mouth daily. (200 mg)    . Multiple Vitamin (MULTIVITAMIN) tablet Take 1 tablet by mouth daily.      . niacin 500 MG tablet Take 500 mg by mouth at bedtime.    . Omega-3 Fatty Acids (FISH OIL) 500 MG CAPS Take 4 capsules by mouth daily.    . saw palmetto 160 MG capsule Take 320 mg by mouth daily.     Marland Kitchen warfarin (COUMADIN) 5 MG tablet TAKE ONE TABLET BY MOUTH ONCE DAILY AS DIRECTED 90 tablet 1   No current facility-administered medications on file prior to visit.    BP 118/70 mmHg  Pulse 70  Temp(Src) 97.4 F (36.3 C) (Oral)  Resp 14  Ht 5\' 8"  (1.727 m)  Wt 181 lb 3 oz (82.186 kg)  BMI 27.56 kg/m2       Objective:   Physical Exam  Constitutional: He is oriented to person, place, and time. He appears well-developed and well-nourished. No distress.  HENT:  Head: Normocephalic and atraumatic.  Cardiovascular: Normal rate and regular rhythm.   No murmur heard. Pulmonary/Chest: Effort normal and breath sounds normal. No respiratory distress. He has no wheezes. He has no rales.  Neurological: He is alert and oriented to person, place,  and time.  Skin: Skin is warm and dry.  Psychiatric: He has a normal mood and affect. His behavior is normal. Thought content normal.          Assessment & Plan:  Near Syncope- New problem, currently resolved. ? Mild dehydration that day. Cardiology ordered PT/INR, CBC and BMET which are currently pending.  I will add UA and culture to rule out UTI.   In addition, cardiology placed an order for an MRI of the brain.

## 2015-08-10 NOTE — Progress Notes (Signed)
Cardiology Office Note    Date:  08/10/2015   ID:  Shawn [name], DOB 14-Sep-1934, MRN [id number]  PCP:  Ann Held, DO  Cardiologist: Dr. Warren Lacy   Chief Complaint  Patient presents with  . Follow-up    seen for Dr. Percival Spanish, visual changes, dizziness, headache.    History of Present Illness:  Shawn [name] is a 81 y.o. male will present for office visit. He has past medical history of mild aortic valve sclerosis with cardiac murmur, hyperlipidemia, factor V Leiden mutation and history of DVT/PE on coumadin. He had 2 episodes of DVT in the past involving the left leg, he also had an episode of PE confirmed by CT of the chest in 2011. He was last seen in the office in February 2017 by Dr. Percival Spanish for increased lower extremity edema, it was felt that it is related to venous insufficiency given his history of vein stripping. There was no obvious sign of acute heart failure.  His INR has been subtherapeutic around 1.9 since June 21. Last Friday, while walking his dog, he had sudden onset of vision changes where he said the road appears to have changed color. The symptom was worse in his left eye. He also has significant dizziness. He eventually got home. While awake laying on bed, he feels like his heart was pounding. He had persistent headache. He denies any chest pain or shortness of breath. His symptom resolved after 30 minutes. He denies any ipsilateral weakness. His wife recommended to him to seek medical attention, however he refused. The symptom has not recurred again. He was added on to my scheduled to be seen today for dizziness and near syncope.   Given the subtherapeutic INR and his history of factor V Leiden, I am concerned about possible TIA or CVA. I will obtain CBC, BMP, PT/INR and outpatient MRI of brain with and without contrast. If MRI of the brain is negative for acute changes, and he has recurrence of his symptom, then we can consider  outpatient monitor at a later time. Occasionally migraine can present similar, however his symptom seems to be very new to him. He is not on any blood pressure medication, his blood pressure today is 110/70. I did a full neural exam on him, he does not have any focal deficit. I do not think the MRI of the brain is urgent at this time given resolution of symptom and last onset more than 4 days ago. The important thing is to get his INR back to therapeutic level.   MRI of brain is more sensitive and capable to picking up tiny embolic infarcts often missed by CTA of head.   Past Medical History  Diagnosis Date  . Hyperlipidemia   . Dysphagia   . Cardiac murmur     mild aortic sclerosis  . Osteoarthritis   . History of blood clots   . Factor V Leiden mutation ([place])   . Colon polyps     Hyperplastic  . Hemorrhoids   . Diverticulosis   . Allergy   . Cataract     left eye  . Clotting disorder ([place])     Factor v Leiden factor mutation    Past Surgical History  Procedure Laterality Date  . Appendectomy    . Vasectomy    . Tonsillectomy    . Inguinal hernia repair    . Cataract extraction  02/09/2007    right eye  . Varicose vein surgery  Current Medications: Outpatient Prescriptions Prior to Visit  Medication Sig Dispense Refill  . Cholecalciferol (CVS VITAMIN D3) 1000 UNITS capsule Take 1,000 Units by mouth daily.      . Coenzyme Q10 (COQ10) 100 MG CAPS Take 1 capsule by mouth daily.      . Flax OIL Take 2,000 mg by mouth daily.    Marland Kitchen Hyaluronic Acid-Vitamin C (HYALURONIC ACID PO) Take 2 tablets by mouth daily. (200 mg)    . Multiple Vitamin (MULTIVITAMIN) tablet Take 1 tablet by mouth daily.      . niacin 500 MG tablet Take 500 mg by mouth at bedtime.    . Omega-3 Fatty Acids (FISH OIL) 500 MG CAPS Take 4 capsules by mouth daily.    . saw palmetto 160 MG capsule Take 320 mg by mouth daily.     Marland Kitchen warfarin (COUMADIN) 5 MG tablet TAKE ONE TABLET BY MOUTH ONCE DAILY AS DIRECTED  90 tablet 1  . L-Theanine 100 MG CAPS Take 1 capsule by mouth daily. Reported on 08/10/2015     No facility-administered medications prior to visit.     Allergies:   Niacin and Pravastatin   Social History   Social History  . Marital Status: Married    Spouse Name: N/A  . Number of Children: 4  . 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 Smoking status: Never Smoker   . Smokeless tobacco: Never Used  . Alcohol Use: No  . Drug Use: No  . Sexual Activity:    Partners: Female   Other Topics Concern  . None   Social History Narrative   Daily caffeine    Exercise-- walks daily with dog   Lives with wife.      Family History:  The patient's family history includes Arthritis in his mother; Heart attack (age of onset: 21) in his mother; Heart disease in his mother; Lung cancer in his father. There is no history of Colon cancer, Esophageal cancer, Rectal cancer, or Stomach cancer.   ROS:   Please see the history of present illness.    ROS All other systems reviewed and are negative.   PHYSICAL EXAM:   VS:  BP 110/70 mmHg  Pulse 73  Ht 5\' 8"  (1.727 m)  Wt 181 lb 3.2 oz (82.192 kg)  BMI 27.56 kg/m2   GEN: Well nourished, well developed, in no acute distress HEENT: normal Neck: no JVD, carotid bruits, or masses Cardiac: RRR; no murmurs, rubs, or gallops,no edema  Respiratory:  clear to auscultation bilaterally, normal work of breathing GI: soft, nontender, nondistended, + BS MS: no deformity or atrophy Skin: warm and dry, no rash Neuro:  Alert and Oriented x 3, Strength and sensation are intact Psych: euthymic mood, full affect  Wt Readings from Last 3 Encounters:  08/10/15 181 lb 3 oz (82.186 kg)  08/10/15 181 lb 3.2 oz (82.192 kg)  03/17/15 184 lb 3 oz (83.547 kg)      Studies/Labs Reviewed:   EKG:  EKG is ordered today.  The ekg ordered today demonstrates NSR without significant ST-T wave  changes  Recent Labs: 11/15/2014: ALT 16 08/10/2015: BUN 15; Creat 1.06; Hemoglobin 17.0; Platelets 145; Potassium 4.0; Sodium 140   Lipid Panel    Component Value Date/Time   CHOL 184 11/15/2014 0914   TRIG 171.0* 11/15/2014 0914   HDL 37.50* 11/15/2014 0914   CHOLHDL 5 11/15/2014 0914   VLDL  34.2 11/15/2014 0914   LDLCALC 112* 11/15/2014 0914   LDLDIRECT 86.0 06/17/2014 1008    Additional studies/ records that were reviewed today include:   Echo 04/01/2015 LV EF: 65% - 70%  ------------------------------------------------------------------- Indications: Aortic valve disease (I35.9).  ------------------------------------------------------------------- History: PMH: Aortic valve disease. Risk factors: Pulmonary embolus. DVT. Osteoarthritis. Factor V deficiency. Dyslipidemia.  ------------------------------------------------------------------- Study Conclusions  - Left ventricle: The cavity size was normal. Systolic function was  vigorous. The estimated ejection fraction was in the range of 65%  to 70%. Wall motion was normal; there were no regional wall  motion abnormalities. Left ventricular diastolic function  parameters were normal. - Aortic valve: Trileaflet; moderately thickened, moderately  calcified leaflets. Valve mobility was mildly restricted. There  was mild stenosis. There was mild regurgitation. Peak velocity  (S): 240 cm/s. Mean gradient (S): 14 mm Hg. Valve area (VTI):  1.55 cm^2. Regurgitation pressure half-time: 506 ms. - Mitral valve: Transvalvular velocity was within the normal range.  There was no evidence for stenosis. There was no regurgitation. - Right ventricle: The cavity size was normal. Wall thickness was  normal. Systolic function was normal. - Pulmonary arteries: Systolic pressure was mildly increased. PA  peak pressure: 33 mm Hg (S). - Inferior vena cava: The vessel was normal in size. The  respirophasic  diameter changes were in the normal range (>= 50%),  consistent with normal central venous pressure.   CTA of chest 11/20/2009 IMPRESSION:  1. Acute pulmonary embolism isolated to the left lower lobe pulmonary artery and its segmental branches. 2. Associated atelectasis in the left lower lobe favored over pulmonary infarct. 3. Dependent atelectasis posteriorly in both lower lobes. No acute cardiopulmonary disease otherwise. 4. Normal heart size without evidence of right heart strain. Mild aortic valvular and LAD coronary artery calcification.     ASSESSMENT:    1. Near syncope   2. Aortic valve disorder   3. Aortic stenosis   4. Factor V deficiency ([place])   5. Subtherapeutic international normalized ratio (INR)   6. Visual color changes      PLAN:  In order of problems listed above:  1. Near syncope: occurred 4 days ago/last Friday, described as sudden onset of visual changes associated with headache, severe dizziness and a feeling of passing out. After getting home, he had pounding sensation in his chest. Symptom resolved without focal deficit after 30 minutes. During the episode, he had more visual deficit on the left side compared to the right side. Will obtain PT/INR. Given lack of residual symptom and onset of more than 4 days ago, he is outside treatment options for TPA in if he had a stroke. I do not think MRI of the brain is urgent at this time given lack of symptoms. However it is still good idea to obtain MRI of the brain to make sure there has not been any stroke. Obtain basic labs include CBC and BMET to make sure his hemoglobin and electrolyte are stable  - Potential differential diagnosis include TIA/CVA versus migraine versus arrhythmia. Despite having pounding sensation in the chest after the event, he has no prior history of arrhythmia, this is an isolated incident. I would only recommend monitor if has recurrence of the symptoms.  2. H/o 2 episodes of  DVT and 1 episode of PE/Factor V leidon mutation: on coumadin  - His INR was borderline subtherapeutic on 2 checks on June 21 and June 28. We'll obtain PT/INR today.  3. Cardiac murmur: he has only 1/6 systolic  murmur on exam, I do not think his valvular disease has progressed significantly  4.  HLD: on niacin and omega 3 fatty acid. Intolerant to pravastatin in the past   Medication Adjustments/Labs and Tests Ordered: Current medicines are reviewed at length with the patient today.  Concerns regarding medicines are outlined above.  Medication changes, Labs and Tests ordered today are listed in the Patient Instructions below. Patient Instructions  Medication Instructions:  None  Labwork: BMET, CBC, PT/INR today  Testing/Procedures: Your physician recommends that you have a MRI of the brain done at your earliest convenience.   Follow-Up: Your physician wants you to follow-up in: 3 months with Dr. Percival Spanish.  You will receive a reminder letter in the mail two months in advance. If you don't receive a letter, please call our office to schedule the follow-up appointment.   Any Other Special Instructions Will Be Listed Below (If Applicable).     If you need a refill on your cardiac medications before your next appointment, please call your pharmacy.       Shawn [name], [place]  08/10/2015 8:09 PM    [address], [place], [place]  [address] Phone: [phone]; Fax: [phone]

## 2015-08-10 NOTE — Telephone Encounter (Signed)
Returned call. Discussed options for being seen - noted in our discussion on Mon, pt wished to see cardiology, aware Dr. Percival Spanish out of office this week. Pt already given instructions on emergent evaluation for return of his symptoms. Agreeable to seeing extender for further recommendations/tests.  In context of transient stroke-like-symptoms last Friday, "heavy" heart beat, subtherapeutic INR, have placed patient on schedule to see Isaac Laud this AM. He also has appt this afternoon w PCP.

## 2015-08-10 NOTE — Progress Notes (Signed)
Pre visit review using our clinic review tool, if applicable. No additional management support is needed unless otherwise documented below in the visit note. 

## 2015-08-10 NOTE — Patient Instructions (Signed)
Medication Instructions:  None  Labwork: BMET, CBC, PT/INR today  Testing/Procedures: Your physician recommends that you have a MRI of the brain done at your earliest convenience.   Follow-Up: Your physician wants you to follow-up in: 3 months with Dr. Percival Spanish.  You will receive a reminder letter in the mail two months in advance. If you don't receive a letter, please call our office to schedule the follow-up appointment.   Any Other Special Instructions Will Be Listed Below (If Applicable).     If you need a refill on your cardiac medications before your next appointment, please call your pharmacy.

## 2015-08-11 LAB — URINALYSIS, ROUTINE W REFLEX MICROSCOPIC
BILIRUBIN URINE: NEGATIVE
HGB URINE DIPSTICK: NEGATIVE
Ketones, ur: NEGATIVE
LEUKOCYTES UA: NEGATIVE
NITRITE: NEGATIVE
PH: 5.5 (ref 5.0–8.0)
RBC / HPF: NONE SEEN (ref 0–?)
Specific Gravity, Urine: >=1.03 — AB (ref 1.000–1.030)
TOTAL PROTEIN, URINE-UPE24: NEGATIVE
URINE GLUCOSE: NEGATIVE
Urobilinogen, UA: 1 (ref 0.0–1.0)
WBC, UA: NONE SEEN (ref 0–?)

## 2015-08-11 LAB — URINE CULTURE: Colony Count: 1000

## 2015-08-15 ENCOUNTER — Telehealth: Payer: Self-pay | Admitting: Cardiology

## 2015-08-15 NOTE — Telephone Encounter (Signed)
New message    Pt is calling to speak to rn   He is has an appt scheduled 08/19/2015 1:00 PM @  [facility] at [facility] for a MRI  And the pt wants to know if there is any other facility that maybe cheaper

## 2015-08-15 NOTE — Telephone Encounter (Signed)
Patient's daughter works at a [place] office - his daughter told him his out of pocket cost may be cheaper at an outpatient imaging facility.  Informed patient that i do not have access to what this particular test costs nor do i know what his deductible is or what his insurance prefers.  Advised patient that he contact his insurance company for advice on this.

## 2015-08-18 ENCOUNTER — Ambulatory Visit (INDEPENDENT_AMBULATORY_CARE_PROVIDER_SITE_OTHER): Payer: Medicare HMO | Admitting: Behavioral Health

## 2015-08-18 DIAGNOSIS — Z86711 Personal history of pulmonary embolism: Secondary | ICD-10-CM

## 2015-08-18 LAB — POCT INR: INR: 2.4

## 2015-08-18 NOTE — Patient Instructions (Signed)
Per Dr. Carollee Herter: Continue taking Coumadin 5 mg daily, except 7.5 mg on Tuesdays and Fridays. Return to clinic in 4 weeks.

## 2015-08-18 NOTE — Progress Notes (Signed)
Pre visit review using our clinic review tool, if applicable. No additional management support is needed unless otherwise documented below in the visit note.  Patient presents in office for INR check. Today's reading was 2.4. He did not report any positive findings or changes with diet/medications.  Per Dr. Carollee Herter: Continue taking Coumadin 5 mg daily, except 7.5 mg on Tuesdays and Fridays. Return to clinic in 4 weeks.  Informed patient of the provider's recommendations. He verbalized understanding and did not have any questions or concerns prior to leaving the nurse visit.  Next appointment scheduled for 09/15/15 at 10:30 AM.

## 2015-08-19 ENCOUNTER — Ambulatory Visit (HOSPITAL_COMMUNITY)
Admission: RE | Admit: 2015-08-19 | Discharge: 2015-08-19 | Disposition: A | Discharge status: Home / Self Care | Payer: Medicare HMO | Source: Ambulatory Visit | Attending: Physician Assistant | Admitting: Physician Assistant

## 2015-08-19 DIAGNOSIS — H5350 Unspecified color vision deficiencies: Secondary | ICD-10-CM | POA: Diagnosis not present

## 2015-08-19 DIAGNOSIS — R42 Dizziness and giddiness: Secondary | ICD-10-CM | POA: Diagnosis not present

## 2015-08-19 DIAGNOSIS — D682 Hereditary deficiency of other clotting factors: Secondary | ICD-10-CM

## 2015-08-19 DIAGNOSIS — R791 Abnormal coagulation profile: Secondary | ICD-10-CM

## 2015-08-19 DIAGNOSIS — R55 Syncope and collapse: Secondary | ICD-10-CM | POA: Insufficient documentation

## 2015-08-19 MED ORDER — GADOBENATE DIMEGLUMINE 529 MG/ML IV SOLN
20.0000 mL | Freq: Once | INTRAVENOUS | Status: AC
  Administered 2015-08-19: 18 mL via INTRAVENOUS

## 2015-09-15 ENCOUNTER — Telehealth: Payer: Self-pay | Admitting: Behavioral Health

## 2015-09-15 ENCOUNTER — Ambulatory Visit (INDEPENDENT_AMBULATORY_CARE_PROVIDER_SITE_OTHER): Payer: Medicare HMO | Admitting: Behavioral Health

## 2015-09-15 DIAGNOSIS — Z86711 Personal history of pulmonary embolism: Secondary | ICD-10-CM

## 2015-09-15 LAB — POCT INR: INR: 2.7

## 2015-09-15 NOTE — Telephone Encounter (Signed)
He can

## 2015-09-15 NOTE — Telephone Encounter (Signed)
Patient would like to know if he can take 1000 mg Vitamin C daily.  Please advise. Thanks.

## 2015-09-15 NOTE — Telephone Encounter (Signed)
Patient's spouse made aware of the below. She voiced understanding and did not have any questions or concerns prior to call ending.

## 2015-09-15 NOTE — Patient Instructions (Signed)
Continue taking Coumadin 5 mg daily, except 7.5 mg on Tuesdays and Fridays. Return to clinic in 4 weeks.

## 2015-09-15 NOTE — Progress Notes (Signed)
Pre visit review using our clinic review tool, if applicable. No additional management support is needed unless otherwise documented below in the visit note.  Patient in clinic today for INR check. Reading was 2.7. He did not report any abnormal bleeding or changes with medications and diet.  Informed patient to continue taking Coumadin 5 mg daily, except 7.5 mg on Tuesdays and Fridays. Return to clinic in 4 weeks. He voiced understanding.  Next appointment scheduled for 10/13/15 at 10:45 AM.

## 2015-10-13 ENCOUNTER — Ambulatory Visit (INDEPENDENT_AMBULATORY_CARE_PROVIDER_SITE_OTHER): Payer: Medicare HMO | Admitting: Behavioral Health

## 2015-10-13 DIAGNOSIS — Z86711 Personal history of pulmonary embolism: Secondary | ICD-10-CM | POA: Diagnosis not present

## 2015-10-13 LAB — POCT INR: INR: 2.8

## 2015-10-13 NOTE — Progress Notes (Signed)
Pre visit review using our clinic review tool, if applicable. No additional management support is needed unless otherwise documented below in the visit note.  Patient in office for INR check. Reviewed medications with the patient. He reported that he missed one dose of Coumadin 3 weeks ago. Patient did not address any other positive findings. During the visit patient inquired about increasing his dose of vitamin B12 2500 mcg to 5000 mcg daily. PCP was made aware and advised that current dose could be increased to 5000 mcg once daily. Informed patient of the medication change and to continue taking Coumadin 5 mg daily, except 7.5 mg on Tuesdays and Fridays. Return to clinic in 4 weeks.  He verbalized understanding and did not have any further questions or concerns before leaving the nurse visit.  Next appointment scheduled for 11/10/15 at 10:45 AM.

## 2015-10-13 NOTE — Patient Instructions (Signed)
Continue taking Coumadin 5 mg daily, except 7.5 mg on Tuesdays and Fridays. Return to clinic in 4 weeks.

## 2015-11-07 NOTE — Progress Notes (Signed)
Cardiology Office Note   Date:  11/08/2015   ID:  Shawn [name], DOB 1934/06/06, MRN [id number]  PCP:  Ann Held, DO  Cardiologist:   Minus Breeding, MD   Chief Complaint  Patient presents with  . Dizziness     History of Present Illness:  Shawn [name] is a 81 y.o. male will present for office visit. He has past medical history of mild aortic valve sclerosis with cardiac murmur, hyperlipidemia, factor V Leiden mutation and history of DVT/PE on coumadin.   He was seen in July for near syncope.  His INR was subtherapeutic.  However, demonstrated no acute changes.  Since then he's had no further symptoms. He walks a mile a treadmill daily. The patient denies any new symptoms such as chest discomfort, neck or arm discomfort. There has been no new shortness of breath, PND or orthopnea. There have been no reported palpitations, presyncope or syncope.  He does still have some mild edema.   Past Medical History:  Diagnosis Date  . Allergy   . Cardiac murmur    mild aortic sclerosis  . Cataract    left eye  . Clotting disorder (HCC)    Factor v Leiden factor mutation  . Colon polyps    Hyperplastic  . Diverticulosis   . Dysphagia   . Factor V Leiden mutation ([place])   . Hemorrhoids   . History of blood clots   . Hyperlipidemia   . Osteoarthritis     Past Surgical History:  Procedure Laterality Date  . APPENDECTOMY    . CATARACT EXTRACTION  02/09/2007   right eye  . INGUINAL HERNIA REPAIR    . TONSILLECTOMY    . VARICOSE VEIN SURGERY    . VASECTOMY       Current Outpatient Prescriptions  Medication Sig Dispense Refill  . Cholecalciferol ([facility] VITAMIN D3) 1000 UNITS capsule Take 1,000 Units by mouth daily.      . Coenzyme Q10 (COQ10) 100 MG CAPS Take 1 capsule by mouth daily.      . Cyanocobalamin (VITAMIN B-12) 5000 MCG TBDP Take 1 tablet by mouth daily.    . Flax OIL Take 2,000 mg by mouth daily.    Marland Kitchen Hyaluronic Acid-Vitamin C  (HYALURONIC ACID PO) Take 2 tablets by mouth daily. (200 mg)    . Multiple Vitamin (MULTIVITAMIN) tablet Take 1 tablet by mouth daily.      . niacin 500 MG tablet Take 500 mg by mouth at bedtime.    . Omega-3 Fatty Acids (FISH OIL) 500 MG CAPS Take 4 capsules by mouth daily.    . saw palmetto 160 MG capsule Take 320 mg by mouth daily.     Marland Kitchen warfarin (COUMADIN) 5 MG tablet TAKE ONE TABLET BY MOUTH ONCE DAILY AS DIRECTED 90 tablet 1   No current facility-administered medications for this visit.     Allergies:   Niacin and Pravastatin    ROS:  Please see the history of present illness.   Otherwise, review of systems are positive for none.   All other systems are reviewed and negative.    PHYSICAL EXAM: VS:  BP (!) 152/87   Pulse 76   Ht 5\' 8"  (1.727 m)   Wt 181 lb (82.1 kg)   BMI 27.52 kg/m  , BMI Body mass index is 27.52 kg/m. GENERAL:  Well appearing NECK:  No jugular venous distention, waveform within normal limits, carotid upstroke brisk and symmetric, no  bruits, no thyromegaly LUNGS:  Clear to auscultation bilaterally BACK:  No CVA tenderness CHEST:  Unremarkable HEART:  PMI not displaced or sustained,S1 and S2 within normal limits, no S3, no S4, no clicks, no rubs, 3/6 early peaking systolic murmur radiating out the aortic outflow tract.  No diastolic murmurs ABD:  Flat, positive bowel sounds normal in frequency in pitch, no bruits, no rebound, no guarding, no midline pulsatile mass, no hepatomegaly, no splenomegaly EXT:  2 plus pulses throughout, mild bilateral leg edema, no cyanosis no clubbing    EKG:  EKG is  ordered today. The ekg ordered today demonstrates NSR, rate 65, no acute ST T wave changes.     Recent Labs: 11/15/2014: ALT 16 08/10/2015: BUN 15; Creat 1.06; Hemoglobin 17.0; Platelets 145; Potassium 4.0; Sodium 140    Lipid Panel    Component Value Date/Time   CHOL 184 11/15/2014 0914   TRIG 171.0 (H) 11/15/2014 0914   HDL 37.50 (L) 11/15/2014 0914    CHOLHDL 5 11/15/2014 0914   VLDL 34.2 11/15/2014 0914   LDLCALC 112 (H) 11/15/2014 0914   LDLDIRECT 86.0 06/17/2014 1008      Wt Readings from Last 3 Encounters:  11/08/15 181 lb (82.1 kg)  08/10/15 181 lb 3 oz (82.2 kg)  08/10/15 181 lb 3.2 oz (82.2 kg)      Other studies Reviewed: Additional studies/ records that were reviewed today include: MRI Review of the above records demonstrates:  Please see elsewhere in the note.     ASSESSMENT AND PLAN:  NEAR SYNCOPE:  He has had no further symptoms. No further workup is suggested. Etiology of this was not clear. As a possibility related to a mildly subtherapeutic INR.  DVT/PE: This is followed and has been therapeutic recently.  AS:  This was mild with mild AI on echo in February.  No change in therapy is planned.   EDEMA:  This is mild and will be treated symptomatically.  Current medicines are reviewed at length with the patient today.  The patient does not have concerns regarding medicines.  The following changes have been made:  no change  Labs/ tests ordered today include:   No orders of the defined types were placed in this encounter.    Disposition:   FU with me in one years.     Signed, Minus Breeding, MD  11/08/2015 3:04 PM    [facility]

## 2015-11-08 ENCOUNTER — Encounter: Payer: Self-pay | Admitting: Cardiology

## 2015-11-08 ENCOUNTER — Ambulatory Visit (INDEPENDENT_AMBULATORY_CARE_PROVIDER_SITE_OTHER): Payer: Medicare HMO | Admitting: Cardiology

## 2015-11-08 VITALS — BP 152/87 | HR 76 | Ht 68.0 in | Wt 181.0 lb

## 2015-11-08 DIAGNOSIS — R55 Syncope and collapse: Secondary | ICD-10-CM

## 2015-11-08 NOTE — Patient Instructions (Signed)

## 2015-11-10 ENCOUNTER — Ambulatory Visit (INDEPENDENT_AMBULATORY_CARE_PROVIDER_SITE_OTHER): Payer: Medicare HMO | Admitting: *Deleted

## 2015-11-10 DIAGNOSIS — Z86718 Personal history of other venous thrombosis and embolism: Secondary | ICD-10-CM

## 2015-11-10 DIAGNOSIS — Z23 Encounter for immunization: Secondary | ICD-10-CM | POA: Diagnosis not present

## 2015-11-10 DIAGNOSIS — O2230 Deep phlebothrombosis in pregnancy, unspecified trimester: Secondary | ICD-10-CM

## 2015-11-10 LAB — POCT INR: INR: 2.8

## 2015-11-10 NOTE — Progress Notes (Signed)
Pre visit review using our clinic review tool, if applicable. No additional management support is needed unless otherwise documented below in the visit note.  Continue taking Coumadin 5 mg daily, except 7.5 mg on Tuesdays and Fridays. Return to clinic in 4 weeks.  Next appointment: 12/08/15  Dorrene German, RN

## 2015-11-10 NOTE — Patient Instructions (Signed)
Continue taking Coumadin 5 mg daily, except 7.5 mg on Tuesdays and Fridays. Return to clinic in 4 weeks.

## 2015-11-10 NOTE — Progress Notes (Signed)
reviewed

## 2015-11-17 ENCOUNTER — Encounter: Payer: Medicare HMO | Admitting: Family Medicine

## 2015-11-20 ENCOUNTER — Other Ambulatory Visit: Payer: Self-pay | Admitting: Family Medicine

## 2015-12-08 ENCOUNTER — Ambulatory Visit: Payer: Medicare HMO

## 2015-12-09 ENCOUNTER — Ambulatory Visit (INDEPENDENT_AMBULATORY_CARE_PROVIDER_SITE_OTHER): Payer: Medicare HMO | Admitting: Behavioral Health

## 2015-12-09 DIAGNOSIS — Z86711 Personal history of pulmonary embolism: Secondary | ICD-10-CM

## 2015-12-09 LAB — POCT INR: INR: 2.6

## 2015-12-09 NOTE — Patient Instructions (Signed)
Continue taking Coumadin 5 mg daily, except 7.5 mg on Tuesdays and Fridays. Return to clinic in 4 weeks.

## 2015-12-09 NOTE — Progress Notes (Signed)
Pre visit review using our clinic review tool, if applicable. No additional management support is needed unless otherwise documented below in the visit note.  Patient presents in office for INR check. Reviewed current regimen with the patient. He did not report any positive findings. Today's reading was 2.6.  Advised patient to continue taking Coumadin 5 mg daily, except 7.5 mg on Tuesdays and Fridays. Return to clinic in 4 weeks.  He verbalized understanding. Next appointment scheduled for 01/12/16 at 10:45 AM.

## 2015-12-22 ENCOUNTER — Encounter: Payer: Self-pay | Admitting: Family Medicine

## 2015-12-22 ENCOUNTER — Ambulatory Visit (INDEPENDENT_AMBULATORY_CARE_PROVIDER_SITE_OTHER): Payer: Medicare HMO | Admitting: Family Medicine

## 2015-12-22 VITALS — BP 145/85 | HR 64 | Temp 98.0°F | Ht 68.0 in | Wt 183.0 lb

## 2015-12-22 DIAGNOSIS — E785 Hyperlipidemia, unspecified: Secondary | ICD-10-CM

## 2015-12-22 DIAGNOSIS — I2699 Other pulmonary embolism without acute cor pulmonale: Secondary | ICD-10-CM

## 2015-12-22 DIAGNOSIS — Z0000 Encounter for general adult medical examination without abnormal findings: Secondary | ICD-10-CM | POA: Diagnosis not present

## 2015-12-22 DIAGNOSIS — N400 Enlarged prostate without lower urinary tract symptoms: Secondary | ICD-10-CM | POA: Diagnosis not present

## 2015-12-22 LAB — COMPREHENSIVE METABOLIC PANEL
ALT: 16 U/L (ref 0–53)
AST: 23 U/L (ref 0–37)
Albumin: 4.4 g/dL (ref 3.5–5.2)
Alkaline Phosphatase: 49 U/L (ref 39–117)
BUN: 14 mg/dL (ref 6–23)
CO2: 31 meq/L (ref 19–32)
Calcium: 9.5 mg/dL (ref 8.4–10.5)
Chloride: 101 mEq/L (ref 96–112)
Creatinine, Ser: 0.97 mg/dL (ref 0.40–1.50)
GFR: 79.37 mL/min (ref >60.00)
GLUCOSE: 101 mg/dL — AB (ref 70–99)
POTASSIUM: 4.6 meq/L (ref 3.5–5.1)
SODIUM: 140 meq/L (ref 135–145)
Total Bilirubin: 1.4 mg/dL — ABNORMAL HIGH (ref 0.2–1.2)
Total Protein: 7 g/dL (ref 6.0–8.3)

## 2015-12-22 LAB — LIPID PANEL
CHOL/HDL RATIO: 5
Cholesterol: 196 mg/dL (ref 0–200)
HDL: 36.5 mg/dL — AB (ref >39.00)
LDL Cholesterol: 124 mg/dL — ABNORMAL HIGH (ref 0–99)
NONHDL: 159.46
Triglycerides: 176 mg/dL — ABNORMAL HIGH (ref 0.0–149.0)
VLDL: 35.2 mg/dL (ref 0.0–40.0)

## 2015-12-22 LAB — CBC WITH DIFFERENTIAL/PLATELET
BASOS ABS: 0 10*3/uL (ref 0.0–0.1)
Basophils Relative: 0.3 % (ref 0.0–3.0)
EOS ABS: 0.1 10*3/uL (ref 0.0–0.7)
Eosinophils Relative: 2 % (ref 0.0–5.0)
HCT: 49.1 % (ref 39.0–52.0)
Hemoglobin: 16.9 g/dL (ref 13.0–17.0)
LYMPHS ABS: 1.5 10*3/uL (ref 0.7–4.0)
Lymphocytes Relative: 37.5 % (ref 12.0–46.0)
MCHC: 34.4 g/dL (ref 30.0–36.0)
MCV: 98 fl (ref 78.0–100.0)
MONO ABS: 0.4 10*3/uL (ref 0.1–1.0)
Monocytes Relative: 10.9 % (ref 3.0–12.0)
NEUTROS PCT: 49.3 % (ref 43.0–77.0)
Neutro Abs: 1.9 10*3/uL (ref 1.4–7.7)
Platelets: 150 10*3/uL (ref 150.0–400.0)
RBC: 5.01 Mil/uL (ref 4.22–5.81)
RDW: 13.2 % (ref 11.5–15.5)
WBC: 3.9 10*3/uL — ABNORMAL LOW (ref 4.0–10.5)

## 2015-12-22 LAB — PSA: PSA: 2.85 ng/mL (ref 0.10–4.00)

## 2015-12-22 NOTE — Patient Instructions (Signed)
Preventive Care 79 Years and Older, Male Preventive care refers to lifestyle choices and visits with your health care provider that can promote health and wellness. What does preventive care include?  A yearly physical exam. This is also called an annual well check.  Dental exams once or twice a year.  Routine eye exams. Ask your health care provider how often you should have your eyes checked.  Personal lifestyle choices, including:  Daily care of your teeth and gums.  Regular physical activity.  Eating a healthy diet.  Avoiding tobacco and drug use.  Limiting alcohol use.  Practicing safe sex.  Taking low doses of aspirin every day.  Taking vitamin and mineral supplements as recommended by your health care provider. What happens during an annual well check? The services and screenings done by your health care provider during your annual well check will depend on your age, overall health, lifestyle risk factors, and family history of disease. Counseling  Your health care provider may ask you questions about your:  Alcohol use.  Tobacco use.  Drug use.  Emotional well-being.  Home and relationship well-being.  Sexual activity.  Eating habits.  History of falls.  Memory and ability to understand (cognition).  Work and work environment. Screening  You may have the following tests or measurements:  Height, weight, and BMI.  Blood pressure.  Lipid and cholesterol levels. These may be checked every 5 years, or more frequently if you are over 50 years old.  Skin check.  Lung cancer screening. You may have this screening every year starting at age 55 if you have a 30-pack-year history of smoking and currently smoke or have quit within the past 15 years.  Fecal occult blood test (FOBT) of the stool. You may have this test every year starting at age 50.  Flexible sigmoidoscopy or colonoscopy. You may have a sigmoidoscopy every 5 years or a colonoscopy every 10  years starting at age 50.  Prostate cancer screening. Recommendations will vary depending on your family history and other risks.  Hepatitis C blood test.  Hepatitis B blood test.  Sexually transmitted disease (STD) testing.  Diabetes screening. This is done by checking your blood sugar (glucose) after you have not eaten for a while (fasting). You may have this done every 1-3 years.  Abdominal aortic aneurysm (AAA) screening. You may need this if you are a current or former smoker.  Osteoporosis. You may be screened starting at age 70 if you are at high risk. Talk with your health care provider about your test results, treatment options, and if necessary, the need for more tests. Vaccines  Your health care provider may recommend certain vaccines, such as:  Influenza vaccine. This is recommended every year.  Tetanus, diphtheria, and acellular pertussis (Tdap, Td) vaccine. You may need a Td booster every 10 years.  Varicella vaccine. You may need this if you have not been vaccinated.  Zoster vaccine. You may need this after age 60.  Measles, mumps, and rubella (MMR) vaccine. You may need at least one dose of MMR if you were born in 1957 or later. You may also need a second dose.  Pneumococcal 13-valent conjugate (PCV13) vaccine. One dose is recommended after age 65.  Pneumococcal polysaccharide (PPSV23) vaccine. One dose is recommended after age 65.  Meningococcal vaccine. You may need this if you have certain conditions.  Hepatitis A vaccine. You may need this if you have certain conditions or if you travel or work in places where   you may be exposed to hepatitis A.  Hepatitis B vaccine. You may need this if you have certain conditions or if you travel or work in places where you may be exposed to hepatitis B.  Haemophilus influenzae type b (Hib) vaccine. You may need this if you have certain risk factors. Talk to your health care provider about which screenings and vaccines you  need and how often you need them. This information is not intended to replace advice given to you by your health care provider. Make sure you discuss any questions you have with your health care provider. Document Released: 02/18/2015 Document Revised: 10/12/2015 Document Reviewed: 11/23/2014 Elsevier Interactive Patient Education  2017 [employer].

## 2015-12-22 NOTE — Progress Notes (Signed)
Pre visit review using our clinic tool,if applicable. No additional management support is needed unless otherwise documented below in the visit note.  

## 2015-12-22 NOTE — Progress Notes (Signed)
Subjective:   Shawn [name] is a 81 y.o. male who presents for Medicare Annual/Subsequent preventive examination.  Review of Systems:   Review of Systems  Constitutional: Negative for activity change, appetite change and fatigue.  HENT: Negative for hearing loss, congestion, tinnitus and ear discharge.   Eyes: Negative for visual disturbance (see optho q1y -- vision corrected to 20/20 with glasses).  Respiratory: Negative for cough, chest tightness and shortness of breath.   Cardiovascular: Negative for chest pain, palpitations and leg swelling.  Gastrointestinal: Negative for abdominal pain, diarrhea, constipation and abdominal distention.  Genitourinary: Negative for urgency, frequency, decreased urine volume and difficulty urinating.  Musculoskeletal: Negative for back pain, arthralgias and gait problem.  Skin: Negative for color change, pallor and rash.  Neurological: Negative for dizziness, light-headedness, numbness and headaches.  Hematological: Negative for adenopathy. Does not bruise/bleed easily.  Psychiatric/Behavioral: Negative for suicidal ideas, confusion, sleep disturbance, self-injury, dysphoric mood, decreased concentration and agitation.  Pt is able to read and write and can do all ADLs No risk for falling No abuse/ violence in home         Objective:    Vitals: BP (!) 145/85   Pulse 64   Temp 98 F (36.7 C) (Oral)   Ht 5\' 8"  (1.727 m)   Wt 183 lb (83 kg)   SpO2 95%   BMI 27.83 kg/m   Body mass index is 27.83 kg/m. BP (!) 145/85   Pulse 64   Temp 98 F (36.7 C) (Oral)   Ht 5\' 8"  (1.727 m)   Wt 183 lb (83 kg)   SpO2 95%   BMI 27.83 kg/m  General appearance: alert, cooperative, appears stated age and no distress Head: Normocephalic, without obvious abnormality, atraumatic Eyes: conjunctivae/corneas clear. PERRL, EOM's intact. Fundi benign. Ears: normal TM's and external ear canals both ears Nose: Nares normal. Septum midline. Mucosa  normal. No drainage or sinus tenderness. Throat: lips, mucosa, and tongue normal; teeth and gums normal Neck: no adenopathy, no carotid bruit, no JVD, supple, symmetrical, trachea midline and thyroid not enlarged, symmetric, no tenderness/mass/nodules Back: symmetric, no curvature. ROM normal. No CVA tenderness. Lungs: clear to auscultation bilaterally Chest wall: no tenderness Heart: S1, S2 normal--  3/6 murmur Abdomen: soft, non-tender; bowel sounds normal; no masses,  no organomegaly Male genitalia: penis: no lesions or discharge. testes: no masses or tenderness. no hernias Rectal: normal tone, normal prostate, no masses or tenderness and soft brown guaiac negative stool noted Extremities: extremities normal, atraumatic, no cyanosis or edema Pulses: 2+ and symmetric Skin: Skin color, texture, turgor normal. No rashes or lesions Lymph nodes: Cervical, supraclavicular, and axillary nodes normal. Neurologic: Alert and oriented X 3, normal strength and tone. Normal symmetric reflexes. Normal coordination and gait Tobacco History  Smoking Status  . Never Smoker  Smokeless Tobacco  . Never Used     Counseling given: No   Past Medical History:  Diagnosis Date  . Allergy   . Cardiac murmur    mild aortic sclerosis  . Cataract    left eye  . Clotting disorder (HCC)    Factor v Leiden factor mutation  . Colon polyps    Hyperplastic  . Diverticulosis   . Dysphagia   . Factor V Leiden mutation ([place])   . Hemorrhoids   . History of blood clots   . Hyperlipidemia   . Osteoarthritis    Past Surgical History:  Procedure Laterality Date  . APPENDECTOMY    . CATARACT EXTRACTION  02/09/2007   right eye  . INGUINAL HERNIA REPAIR    . TONSILLECTOMY    . VARICOSE VEIN SURGERY    . VASECTOMY     Family History  Problem Relation Age of Onset  . Lung cancer Father   . Heart attack Mother 46  . Heart disease Mother     chf  . Arthritis Mother   . Colon cancer Neg Hx   . Esophageal  cancer Neg Hx   . Rectal cancer Neg Hx   . Stomach cancer Neg Hx    History  Sexual Activity  . Sexual activity: Yes  . Partners: Female    Outpatient Encounter Prescriptions as of 12/22/2015  Medication Sig  . Cholecalciferol ([facility] VITAMIN D3) 1000 UNITS capsule Take 1,000 Units by mouth daily.    . Cyanocobalamin (VITAMIN B-12) 5000 MCG TBDP Take 1 tablet by mouth daily.  . Flax OIL Take 2,000 mg by mouth daily.  Marland Kitchen Hyaluronic Acid-Vitamin C (HYALURONIC ACID PO) Take 2 tablets by mouth daily. (200 mg)  . Multiple Vitamin (MULTIVITAMIN) tablet Take 1 tablet by mouth daily.    . niacin 500 MG tablet Take 500 mg by mouth at bedtime.  . saw palmetto 160 MG capsule Take 320 mg by mouth daily.   Marland Kitchen warfarin (COUMADIN) 5 MG tablet TAKE ONE TABLET BY MOUTH ONCE DAILY AS DIRECTED  . [DISCONTINUED] Coenzyme Q10 (COQ10) 100 MG CAPS Take 1 capsule by mouth daily.    . [DISCONTINUED] Omega-3 Fatty Acids (FISH OIL) 500 MG CAPS Take 4 capsules by mouth daily.   No facility-administered encounter medications on file as of 12/22/2015.     Activities of Daily Living In your present state of health, do you have any difficulty performing the following activities: 12/22/2015  Hearing? N  Vision? N  Difficulty concentrating or making decisions? N  Walking or climbing stairs? N  Dressing or bathing? N  Doing errands, shopping? N  Some recent data might be hidden    Patient Care Team: Ann Held, DO as PCP - [place], MD as Consulting Physician (Cardiology) Nani Ravens, MD as Referring Physician (Dermatology) Warden Fillers, MD as Consulting Physician (Ophthalmology)   Assessment:    CPE Exercise Activities and Dietary recommendations Current Exercise Habits: Home exercise routine, Type of exercise: walking, Time (Minutes): 30, Frequency (Times/Week): 7, Weekly Exercise (Minutes/Week): 210, Intensity: Moderate  Goals    None     Fall Risk Fall Risk   11/15/2014 11/15/2014 11/13/2013 10/30/2012  Falls in the past year? No No No No   Depression Screen PHQ 2/9 Scores 11/15/2014 11/15/2014 11/13/2013 10/30/2012  PHQ - 2 Score 0 0 0 0    Cognitive Function MMSE - Mini Mental State Exam 12/22/2015  Orientation to time 5  Orientation to Place 5  Registration 3  Attention/ Calculation 5  Recall 3  Language- name 2 objects 2  Language- repeat 1  Language- follow 3 step command 3  Language- read & follow direction 1  Write a sentence 1  Copy design 1  Total score 30        Immunization History  Administered Date(s) Administered  . Influenza Split 11/17/2010, 11/29/2011  . Influenza Whole 11/12/2007, 11/17/2008, 11/09/2009  . Influenza, High Dose Seasonal PF 11/15/2014, 11/10/2015  . Influenza,inj,Quad PF,36+ Mos 10/30/2012, 11/13/2013, 02/01/2015  . Pneumococcal Conjugate-13 12/15/2013  . Pneumococcal Polysaccharide-23 09/04/2005, 01/21/2012  . Td 01/18/1999, 10/13/2008  . Tdap 01/21/2012  . Zoster 11/27/2006  Screening Tests Health Maintenance  Topic Date Due  . TETANUS/TDAP  01/20/2022  . INFLUENZA VACCINE  Completed  . ZOSTAVAX  Completed  . PNA vac Low Risk Adult  Completed      Plan:    During the course of the visit the patient was educated and counseled about the following appropriate screening and preventive services:   Vaccines to include Pneumoccal, Influenza, Hepatitis B, Td, Zostavax, HCV  Electrocardiogram  Cardiovascular Disease  Colorectal cancer screening  Diabetes screening  Prostate Cancer Screening  Glaucoma screening  Nutrition counseling   Smoking cessation counseling  Patient Instructions (the written plan) was given to the patient.  1. Hyperlipidemia LDL goal <100 con't niacin Check labs - Comprehensive metabolic panel - Lipid panel  2. Other acute pulmonary embolism without acute cor pulmonale (HCC) On warfarin - Comprehensive metabolic panel - Lipid panel - CBC with  Differential/Platelet - POCT urinalysis dipstick  3. BPH without urinary obstruction   - PSA  4. Preventative health care See above - Fecal occult blood, imunochemical; Future  5. Encounter for Medicare annual wellness exam    Ann Held, DO  12/22/2015

## 2016-01-03 ENCOUNTER — Other Ambulatory Visit (INDEPENDENT_AMBULATORY_CARE_PROVIDER_SITE_OTHER): Payer: Medicare HMO

## 2016-01-03 DIAGNOSIS — Z0000 Encounter for general adult medical examination without abnormal findings: Secondary | ICD-10-CM

## 2016-01-03 LAB — FECAL OCCULT BLOOD, IMMUNOCHEMICAL: Fecal Occult Bld: NEGATIVE

## 2016-01-12 ENCOUNTER — Ambulatory Visit: Payer: Medicare HMO

## 2016-01-13 ENCOUNTER — Ambulatory Visit (INDEPENDENT_AMBULATORY_CARE_PROVIDER_SITE_OTHER): Payer: Medicare HMO

## 2016-01-13 DIAGNOSIS — Z7901 Long term (current) use of anticoagulants: Secondary | ICD-10-CM | POA: Diagnosis not present

## 2016-01-13 LAB — POCT INR: INR: 2.8

## 2016-01-13 NOTE — Progress Notes (Signed)
Pre visit review using our clinic tool,if applicable. No additional management support is needed unless otherwise documented below in the visit note.   Patient in for INR check per odrder from Dr. Carollee Herter.  INR= 2.8

## 2016-01-13 NOTE — Patient Instructions (Addendum)
Per Dr. Carollee Herter continue taking Coumadin 5 mg daily, except 7.5 mg on Tuesdays and Fridays. Return to clinic in 5 weeks.  Appointment scheduled for January 11,2018 at 10:45 am

## 2016-02-07 ENCOUNTER — Telehealth: Payer: Self-pay | Admitting: Family Medicine

## 2016-02-07 ENCOUNTER — Other Ambulatory Visit: Payer: Self-pay | Admitting: Emergency Medicine

## 2016-02-07 MED ORDER — WARFARIN SODIUM 5 MG PO TABS: ORAL_TABLET | ORAL | 1 refills | Status: DC

## 2016-02-07 NOTE — Telephone Encounter (Signed)
Caller name: Relationship to patient: Self Can be reached:  [phone]  Pharmacy:  [id number] - [place], [place] [phone] (Phone) [phone] (Fax)     Reason for call: Request refill on warfarin (COUMADIN) 5 MG tablet

## 2016-02-07 NOTE — Telephone Encounter (Signed)
Received refill request for warfarin (COUMADIN) 5 MG tablet. Last office visit 12/22/2015 and last refill 11/21/15. Is it ok to refill? Please advise.

## 2016-02-08 NOTE — Telephone Encounter (Signed)
Rx refill request is complete 02/07/16. LB

## 2016-02-09 NOTE — Telephone Encounter (Signed)
Pt called in to follow up on refill request. Confirmed pharmacy. Pt says that it should have been sent to pharmacy provided below.    Please send Rx to specified pharmacy.    Thanks.

## 2016-02-13 ENCOUNTER — Other Ambulatory Visit: Payer: Self-pay

## 2016-02-13 MED ORDER — WARFARIN SODIUM 5 MG PO TABS: ORAL_TABLET | ORAL | 1 refills | Status: DC

## 2016-02-13 NOTE — Telephone Encounter (Signed)
Patient is calling to follow up on this medication refill. It has been sent to the wrong pharmacy. Patient states it needs to go to the [facility]. Patient states he is completely out of medication. Please advise.

## 2016-02-13 NOTE — Telephone Encounter (Signed)
Rx sent to pharmacy, per patient request. LB

## 2016-02-15 DIAGNOSIS — R69 Illness, unspecified: Secondary | ICD-10-CM | POA: Diagnosis not present

## 2016-02-16 ENCOUNTER — Telehealth: Payer: Self-pay | Admitting: Family Medicine

## 2016-02-16 ENCOUNTER — Encounter: Payer: Self-pay | Admitting: Family Medicine

## 2016-02-16 ENCOUNTER — Ambulatory Visit (INDEPENDENT_AMBULATORY_CARE_PROVIDER_SITE_OTHER): Payer: Medicare HMO | Admitting: Family Medicine

## 2016-02-16 ENCOUNTER — Ambulatory Visit (INDEPENDENT_AMBULATORY_CARE_PROVIDER_SITE_OTHER): Payer: Medicare HMO | Admitting: Behavioral Health

## 2016-02-16 VITALS — BP 161/98 | HR 79 | Ht 68.0 in | Wt 177.0 lb

## 2016-02-16 DIAGNOSIS — Z86711 Personal history of pulmonary embolism: Secondary | ICD-10-CM | POA: Diagnosis not present

## 2016-02-16 DIAGNOSIS — M79645 Pain in left finger(s): Secondary | ICD-10-CM | POA: Diagnosis not present

## 2016-02-16 LAB — POCT INR: INR: 2.9

## 2016-02-16 MED ORDER — METHYLPREDNISOLONE ACETATE 40 MG/ML IJ SUSP
20.0000 mg | Freq: Once | INTRAMUSCULAR | Status: AC
  Administered 2016-02-16: 20 mg via INTRA_ARTICULAR

## 2016-02-16 NOTE — Patient Instructions (Signed)
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 These are the different classes of medicine you can take for this: Tylenol 500mg  1-2 tabs three times a day for pain. AVOID ibuprofen and aleve (though we could try voltaren gel). Glucosamine sulfate 750mg  twice a day is a supplement that may help. Capsaicin, aspercreme, or biofreeze topically up to four times a day may also help with pain. Cortisone injections are an option - you were given this today. Thumb spica brace or thumbkeeper to rest this when you're very active or have pain. Heat or ice 15 minutes at a time 3-4 times a day as needed to help with pain. Follow up with me in 1 month or as needed.

## 2016-02-16 NOTE — Progress Notes (Signed)
Pre visit review using our clinic review tool, if applicable. No additional management support is needed unless otherwise documented below in the visit note.  Patient presents in clinic for INR check. Reviewed current regimen with the patient. He reported no changes; all findings were negative. Today's INR reading was 2.9.  Advised patient to continue taking Coumadin 5 mg daily, except 7.5 mg on Tuesdays and Fridays. Return to clinic in 5 weeks.  He verbalized understanding. Next appointment scheduled for 03/22/16 at 10:45 AM.

## 2016-02-16 NOTE — Patient Instructions (Signed)
Continue taking Coumadin 5 mg daily, except 7.5 mg on Tuesdays and Fridays. Return to clinic in 5 weeks.   

## 2016-02-16 NOTE — Telephone Encounter (Signed)
Pt came in office stating that had an appt for his cpe on 12-24-16 since his last was on 12-22-2015 but pt states verified with insurance and that they informed him that he can have is cpe any time of the year. Pt changed his CPE appt for an earlier appt (09-21-2016).

## 2016-02-20 DIAGNOSIS — M79645 Pain in left finger(s): Secondary | ICD-10-CM

## 2016-02-20 HISTORY — DX: Pain in left finger(s): M79.645

## 2016-02-20 NOTE — Assessment & Plan Note (Signed)
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  Tylenol, glucosamine, topical medications discussed.  Cortisone injection given today.  Thumb spica or thumbkeeper if needed to rest this.  Heat/ice.  F/u in 1 month or prn.  After informed written consent patient was seated in chair in exam room.  Area overlying left 1st CMC identified with ultrasound, prepped with alcohol swab, then injected with 0.5:0.74mL bupivicaine: depomedrol.  Patient tolerated procedure well without immediate complications.

## 2016-02-20 NOTE — Progress Notes (Signed)
PCP: Ann Held, DO  Subjective:   HPI: Patient is a 82 y.o. male here for left thumb pain.  Patient reports he's had problems with pain in base of left thumb for over 2 months. No injury or trauma. Pain is 1/10 at rest, dull.  Worse with motions, gripping items. Gets a sharp jolt of pain here that can radiate up the arm. No locking, catching. No skin changes, numbness.  Past Medical History:  Diagnosis Date  . Allergy   . Cardiac murmur    mild aortic sclerosis  . Cataract    left eye  . Clotting disorder (HCC)    Factor v Leiden factor mutation  . Colon polyps    Hyperplastic  . Diverticulosis   . Dysphagia   . Factor V Leiden mutation ([place])   . Hemorrhoids   . History of blood clots   . Hyperlipidemia   . Osteoarthritis     Current Outpatient Prescriptions on File Prior to Visit  Medication Sig Dispense Refill  . Cholecalciferol ([employer] VITAMIN D3) 1000 UNITS capsule Take 1,000 Units by mouth daily.      . Cyanocobalamin (VITAMIN B-12) 5000 MCG TBDP Take 1 tablet by mouth daily.    . folic acid (FOLVITE) Q000111Q MCG tablet Take 400 mcg by mouth daily.    Marland Kitchen Hyaluronic Acid-Vitamin C (HYALURONIC ACID PO) Take 2 tablets by mouth daily. (200 mg)    . Multiple Vitamin (MULTIVITAMIN) tablet Take 1 tablet by mouth daily.      . niacin 500 MG tablet Take 500 mg by mouth at bedtime.    . Omega-3 Fatty Acids (FISH OIL) 1000 MG CPDR Take 4,000 mg by mouth daily. 1000 mg tablets    . saw palmetto 160 MG capsule Take 320 mg by mouth daily.     Marland Kitchen warfarin (COUMADIN) 5 MG tablet TAKE ONE TABLET BY MOUTH ONCE DAILY AS DIRECTED 90 tablet 1   No current facility-administered medications on file prior to visit.     Past Surgical History:  Procedure Laterality Date  . APPENDECTOMY    . CATARACT EXTRACTION  02/09/2007   right eye  . INGUINAL HERNIA REPAIR    . TONSILLECTOMY    . VARICOSE VEIN SURGERY    . VASECTOMY      Allergies  Allergen Reactions  . Niacin Rash    "My  whole body turned red on the prescription dose" He takes the OTC medication  . Pravastatin Other (See Comments)    Disorientation    Social History   Social History  . Marital status: Married    Spouse name: N/A  . Number of children: 4  . Years of education: N/A   Occupational History  . 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 Smoking status: Never Smoker  . Smokeless tobacco: Never Used  . Alcohol use No  . Drug use: No  . Sexual activity: Yes    Partners: Female   Other Topics Concern  . Not on file   Social History Narrative   Daily caffeine    Exercise-- walks daily with dog   Lives with wife.     Family History  Problem Relation Age of Onset  . Lung cancer Father   . Heart attack Mother 40  . Heart disease Mother     chf  . Arthritis Mother   . Colon cancer Neg Hx   . Esophageal cancer Neg Hx   . Rectal cancer  Neg Hx   . Stomach cancer Neg Hx     BP (!) 161/98   Pulse 79   Ht 5\' 8"  (1.727 m)   Wt 177 lb (80.3 kg)   BMI 26.91 kg/m   Review of Systems: See HPI above.     Objective:  Physical Exam:  Gen: NAD, comfortable in exam room  Left wrist/thumb: No gross deformity, swelling, deformity. TTP 1st CMC.  No tenderness of 1st dorsal compartment, carpal tunnel, elsewhere about hand/wrist. Mild limitation ROM 1st CMC with mild pain.  FROM other digits, wrist. Negative finkelsteins. Negative tinels, phalens. NVI distally.   Right wrist/thumb: FROM without pain.  Assessment & Plan:  1. 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  Tylenol, glucosamine, topical medications discussed.  Cortisone injection given today.  Thumb spica or thumbkeeper if needed to rest this.  Heat/ice.  F/u in 1 month or prn.  After informed written consent patient was seated in chair in exam room.  Area overlying left 1st CMC identified with ultrasound, prepped with alcohol swab, then injected with 0.5:0.24mL bupivicaine: depomedrol.  Patient tolerated  procedure well without immediate complications.

## 2016-03-22 ENCOUNTER — Ambulatory Visit (INDEPENDENT_AMBULATORY_CARE_PROVIDER_SITE_OTHER): Payer: Medicare HMO | Admitting: Family Medicine

## 2016-03-22 ENCOUNTER — Encounter: Payer: Self-pay | Admitting: Family Medicine

## 2016-03-22 ENCOUNTER — Ambulatory Visit (INDEPENDENT_AMBULATORY_CARE_PROVIDER_SITE_OTHER): Payer: Medicare HMO | Admitting: Behavioral Health

## 2016-03-22 VITALS — BP 151/81 | HR 71 | Ht 68.0 in | Wt 177.0 lb

## 2016-03-22 DIAGNOSIS — M79645 Pain in left finger(s): Secondary | ICD-10-CM

## 2016-03-22 DIAGNOSIS — Z86711 Personal history of pulmonary embolism: Secondary | ICD-10-CM

## 2016-03-22 LAB — POCT INR: INR: 2.9

## 2016-03-22 MED ORDER — METHYLPREDNISOLONE ACETATE 40 MG/ML IJ SUSP
20.0000 mg | Freq: Once | INTRAMUSCULAR | Status: AC
  Administered 2016-03-22: 20 mg via INTRA_ARTICULAR

## 2016-03-22 NOTE — Patient Instructions (Signed)
Continue taking Coumadin 5 mg daily, except 7.5 mg on Tuesdays and Fridays. Return to clinic in 5 weeks.   

## 2016-03-22 NOTE — Progress Notes (Signed)
Pre visit review using our clinic review tool, if applicable. No additional management support is needed unless otherwise documented below in the visit note.  Patient came in office today for INR check. Verified current medication regimen. All patient findings were negative. INR reading during visit was 2.9.  Informed patient to continue taking Coumadin 5 mg daily, except 7.5 mg on Tuesdays and Fridays. Return to clinic in 5 weeks.   He voiced understanding and did not have any questions or concerns before leaving the nurse visit.  Next appointment scheduled for 04/26/16 at 10:45 AM.

## 2016-03-22 NOTE — Patient Instructions (Signed)
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 These are the different classes of medicine you can take for this: Tylenol 500mg  1-2 tabs three times a day for pain. AVOID ibuprofen and aleve (though we could try voltaren gel). Glucosamine sulfate 750mg  twice a day is a supplement that may help. Capsaicin, aspercreme, or biofreeze topically up to four times a day may also help with pain. Cortisone injections are an option - you were given this today. Thumb spica brace or thumbkeeper to rest this when you're very active or have pain. Heat or ice 15 minutes at a time 3-4 times a day as needed to help with pain. Follow up with me in 1 month or as needed.

## 2016-03-23 NOTE — Progress Notes (Signed)
PCP: Ann Held, DO  Subjective:   HPI: Patient is a 82 y.o. male here for left thumb pain.  1/11: Patient reports he's had problems with pain in base of left thumb for over 2 months. No injury or trauma. Pain is 1/10 at rest, dull.  Worse with motions, gripping items. Gets a sharp jolt of pain here that can radiate up the arm. No locking, catching. No skin changes, numbness.  2/15: Patient returns reporting some improvement since last visit. Injection helped him - pain not as consistent but still will get jolt up the arm. Pain level 6/10, sharp when it comes on. Feels at base of thumb and moves from there. Worse with gripping and carrying things. No skin changes, numbness.  Past Medical History:  Diagnosis Date  . Allergy   . Cardiac murmur    mild aortic sclerosis  . Cataract    left eye  . Clotting disorder (HCC)    Factor v Leiden factor mutation  . Colon polyps    Hyperplastic  . Diverticulosis   . Dysphagia   . Factor V Leiden mutation ([place])   . Hemorrhoids   . History of blood clots   . Hyperlipidemia   . Osteoarthritis     Current Outpatient Prescriptions on File Prior to Visit  Medication Sig Dispense Refill  . Cholecalciferol ([employer]S VITAMIN D3) 1000 UNITS capsule Take 1,000 Units by mouth daily.      . Cyanocobalamin (VITAMIN B-12) 5000 MCG TBDP Take 1 tablet by mouth daily.    . folic acid (FOLVITE) Q000111Q MCG tablet Take 400 mcg by mouth daily.    Marland Kitchen Hyaluronic Acid-Vitamin C (HYALURONIC ACID PO) Take 2 tablets by mouth daily. (200 mg)    . Multiple Vitamin (MULTIVITAMIN) tablet Take 1 tablet by mouth daily.      . niacin 500 MG tablet Take 500 mg by mouth at bedtime.    . Omega-3 Fatty Acids (FISH OIL) 1000 MG CPDR Take 4,000 mg by mouth daily. 1000 mg tablets    . saw palmetto 160 MG capsule Take 320 mg by mouth daily.     Marland Kitchen warfarin (COUMADIN) 5 MG tablet TAKE ONE TABLET BY MOUTH ONCE DAILY AS DIRECTED 90 tablet 1   No current  facility-administered medications on file prior to visit.     Past Surgical History:  Procedure Laterality Date  . APPENDECTOMY    . CATARACT EXTRACTION  02/09/2007   right eye  . INGUINAL HERNIA REPAIR    . TONSILLECTOMY    . VARICOSE VEIN SURGERY    . VASECTOMY      Allergies  Allergen Reactions  . Niacin Rash    "My whole body turned red on the prescription dose" He takes the OTC medication  . Pravastatin Other (See Comments)    Disorientation    Social History   Social History  . Marital status: Married    Spouse name: N/A  . Number of children: 4  . Years of education: N/A   Occupational History  . 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 Smoking status: Never Smoker  . Smokeless tobacco: Never Used  . Alcohol use No  . Drug use: No  . Sexual activity: Yes    Partners: Female   Other Topics Concern  . Not on file   Social History Narrative   Daily caffeine    Exercise-- walks daily with dog   Lives with wife.  Family History  Problem Relation Age of Onset  . Lung cancer Father   . Heart attack Mother 65  . Heart disease Mother     chf  . Arthritis Mother   . Colon cancer Neg Hx   . Esophageal cancer Neg Hx   . Rectal cancer Neg Hx   . Stomach cancer Neg Hx     BP (!) 151/81   Pulse 71   Ht 5\' 8"  (1.727 m)   Wt 177 lb (80.3 kg)   BMI 26.91 kg/m   Review of Systems: See HPI above.     Objective:  Physical Exam:  Gen: NAD, comfortable in exam room  Left wrist/thumb: No gross deformity, swelling, deformity. TTP 1st CMC.  No tenderness of 1st dorsal compartment, carpal tunnel, elsewhere about hand/wrist. Mild limitation ROM 1st CMC with mild pain.  FROM other digits, wrist. Negative finkelsteins. Negative tinels, phalens. NVI distally.   Right wrist/thumb: FROM without pain.  Assessment & Plan:  1. 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  Tylenol, glucosamine, topical medications discussed.  Some improvement after  injection last visit - he would like to repeat this.  Cortisone injection repeated today.  Thumb spica or thumbkeeper if needed to rest this.  Heat/ice.  F/u in 1 month or prn.  After informed written consent patient was seated in chair in exam room.  Area overlying left 1st CMC identified with ultrasound, prepped with alcohol swab, then injected with 0.5:0.21mL bupivicaine: depomedrol.  Patient tolerated procedure well without immediate complications.

## 2016-03-26 NOTE — Assessment & Plan Note (Signed)
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  Tylenol, glucosamine, topical medications discussed.  Some improvement after injection last visit - he would like to repeat this.  Cortisone injection repeated today.  Thumb spica or thumbkeeper if needed to rest this.  Heat/ice.  F/u in 1 month or prn.  After informed written consent patient was seated in chair in exam room.  Area overlying left 1st CMC identified with ultrasound, prepped with alcohol swab, then injected with 0.5:0.66mL bupivicaine: depomedrol.  Patient tolerated procedure well without immediate complications.

## 2016-04-26 ENCOUNTER — Ambulatory Visit (INDEPENDENT_AMBULATORY_CARE_PROVIDER_SITE_OTHER): Payer: Medicare HMO | Admitting: Behavioral Health

## 2016-04-26 DIAGNOSIS — Z86711 Personal history of pulmonary embolism: Secondary | ICD-10-CM

## 2016-04-26 LAB — POCT INR: INR: 2.3

## 2016-04-26 NOTE — Patient Instructions (Signed)
Continue taking Coumadin 5 mg daily, except 7.5 mg on Tuesdays and Fridays. Return to clinic in 5 weeks.

## 2016-04-26 NOTE — Progress Notes (Signed)
Pre visit review using our clinic review tool, if applicable. No additional management support is needed unless otherwise documented below in the visit note.  Patient in clinic today for INR recheck. He's compliant with medication & current regimen. Patient reported no changes or positive findings. INR reading during visit was 2.3.  Advised patient to continue taking Coumadin 5 mg daily, except 7.5 mg on Tuesdays and Fridays. Return to clinic in 5 weeks.  He verbalized understanding. Next appointment 05/31/16 at 10:00 AM.

## 2016-04-26 NOTE — Progress Notes (Signed)
Reviewed  Yvonne R Lowne Chase, DO  

## 2016-05-31 ENCOUNTER — Ambulatory Visit (INDEPENDENT_AMBULATORY_CARE_PROVIDER_SITE_OTHER): Payer: Medicare HMO | Admitting: Behavioral Health

## 2016-05-31 DIAGNOSIS — Z86711 Personal history of pulmonary embolism: Secondary | ICD-10-CM

## 2016-05-31 LAB — POCT INR: INR: 2

## 2016-05-31 NOTE — Progress Notes (Signed)
Pre visit review using our clinic review tool, if applicable. No additional management support is needed unless otherwise documented below in the visit note.  Patient came in clinic for INR check. He reports adherence to current medication regimen. Patient voices no changes or positive findings. INR reading during visit was 2.0.  Advised patient to continue taking Coumadin 5 mg daily, except 7.5 mg on Tuesdays and Fridays. Return to clinic in 5 weeks.  He verbalized understanding. Next appointment scheduled for 07/05/16 at 10:45 AM.

## 2016-05-31 NOTE — Patient Instructions (Signed)
Continue taking Coumadin 5 mg daily, except 7.5 mg on Tuesdays and Fridays. Return to clinic in 5 weeks.

## 2016-06-01 ENCOUNTER — Ambulatory Visit: Payer: Medicare HMO

## 2016-06-19 DIAGNOSIS — R69 Illness, unspecified: Secondary | ICD-10-CM | POA: Diagnosis not present

## 2016-07-05 ENCOUNTER — Ambulatory Visit (INDEPENDENT_AMBULATORY_CARE_PROVIDER_SITE_OTHER): Payer: Medicare HMO

## 2016-07-05 DIAGNOSIS — Z7901 Long term (current) use of anticoagulants: Secondary | ICD-10-CM

## 2016-07-05 LAB — POCT INR: INR: 1.8

## 2016-07-05 NOTE — Progress Notes (Signed)
Pre visit review using our clinic tool,if applicable. No additional management support is needed unless otherwise documented below in the visit note.   Patient in for INR check per order from Dr. Carollee Herter.   No missed doses, bruising or bleeding. No changes in diet per patient.   INR today = 1.8 Goal = 2.0-3.0  Per Dr. Etter Sjogren patient to  Continue5 mg on Sunday Monday Wednesday Thursday and Saturday take 7.5 mg on Tuesday and Friday. Return for recheck next week on June 7th 2018 @ 10:15 am.   After visit over patient mentioned that he had lost 9 pounds during his move. Advised I would mentioned to Dr. Etter Sjogren and if she makes any further changes I would call him.

## 2016-07-05 NOTE — Patient Instructions (Signed)
Per Dr. Etter Sjogren patient to  Continue5 mg on Sunday Monday Wednesday Thursday and Saturday take 7.5 mg on Tuesday and Friday. Return for recheck next week on June 7th 2018 @ 10:15 am.

## 2016-07-12 ENCOUNTER — Ambulatory Visit (INDEPENDENT_AMBULATORY_CARE_PROVIDER_SITE_OTHER): Payer: Medicare HMO

## 2016-07-12 DIAGNOSIS — Z7901 Long term (current) use of anticoagulants: Secondary | ICD-10-CM | POA: Diagnosis not present

## 2016-07-12 LAB — POCT INR: INR: 1.9

## 2016-07-12 NOTE — Patient Instructions (Signed)
Per Dr. Etter Sjogren patient to  Take Coumadin 7.5mg  on Monday Wednesday and Friday and 5 mg all other days. Return for recheck next week on June 15th 2018 @ 2:30.   pm.

## 2016-07-20 ENCOUNTER — Ambulatory Visit (INDEPENDENT_AMBULATORY_CARE_PROVIDER_SITE_OTHER): Payer: Medicare HMO | Admitting: Behavioral Health

## 2016-07-20 DIAGNOSIS — Z86711 Personal history of pulmonary embolism: Secondary | ICD-10-CM

## 2016-07-20 LAB — POCT INR: INR: 2.3

## 2016-07-20 NOTE — Patient Instructions (Addendum)
Per Dr. Carollee Herter: Continue taking Coumadin 7.5 mg on Monday, Wednesday and Friday & 5 mg all the other days. Return for INR check in 4 weeks.

## 2016-07-20 NOTE — Progress Notes (Signed)
Pre visit review using our clinic review tool, if applicable. No additional management support is needed unless otherwise documented below in the visit note.  Patient presents in clinic for INR recheck. He reported no changes or positive findings. Patient adheres to medication & current regimen. Today's INR reading was 2.3.  Per Dr. Carollee Herter: Continue taking Coumadin 7.5 mg on Monday, Wednesday and Friday & 5 mg all the other days. Return for INR check in 4 weeks.  Informed patient of the provider's recommendations. He voiced understanding. Next appointment scheduled for 08/17/16 at 10:45 AM.

## 2016-07-23 ENCOUNTER — Other Ambulatory Visit: Payer: Self-pay | Admitting: Family Medicine

## 2016-08-15 ENCOUNTER — Telehealth: Payer: Self-pay

## 2016-08-15 NOTE — Telephone Encounter (Signed)
Received form, Dr. Joneen Caraway at Dr[facility] Bernie[facility] Pap & Joneen Caraway needing approval to hold Pt's coumadin for upcoming procedure. Form completed and faxed to [phone].

## 2016-08-17 ENCOUNTER — Ambulatory Visit (INDEPENDENT_AMBULATORY_CARE_PROVIDER_SITE_OTHER): Payer: Medicare HMO

## 2016-08-17 DIAGNOSIS — Z86711 Personal history of pulmonary embolism: Secondary | ICD-10-CM

## 2016-08-17 LAB — POCT INR: INR: 3.2

## 2016-08-17 NOTE — Progress Notes (Signed)
Pre visit review using our clinic tool,if applicable. No additional management support is needed unless otherwise documented below in the visit note.   Patient in for INR check per order from Dr. Carollee Herter  INR today = 3.2  Patient states he has had an increase in the amount of greens he has eaten.   Per Dr. Carollee Herter patient to reduce green intake and continue Coumadin as ordered. Return for INR check in 1 week. Patietn agreed.  Return appointment given.

## 2016-08-24 ENCOUNTER — Ambulatory Visit (INDEPENDENT_AMBULATORY_CARE_PROVIDER_SITE_OTHER): Payer: Medicare HMO

## 2016-08-24 ENCOUNTER — Ambulatory Visit: Payer: Medicare HMO

## 2016-08-24 DIAGNOSIS — I2699 Other pulmonary embolism without acute cor pulmonale: Secondary | ICD-10-CM | POA: Diagnosis not present

## 2016-08-24 NOTE — Progress Notes (Addendum)
Pre visit review using our clinic tool,if applicable. No additional management support is needed unless otherwise documented below in the visit note.   Patient in for INR check per order from Dr. Carollee Herter due to patient having Hx Pulmonary Embolism.  Patient has no complaints today. Has not  missed any doses or had any diet changes. INR last visit = 3.2  INR today = 3.2    Per Dr. Carollee Herter  Patient to take Coumadin  5 mg today and continue dosage as ordered. Patient to return  In 1 week for re-check INR.   Appointment scheduled for August 30 2016.   Reviewed Ann Held, DO

## 2016-08-30 ENCOUNTER — Ambulatory Visit (INDEPENDENT_AMBULATORY_CARE_PROVIDER_SITE_OTHER): Payer: Medicare HMO | Admitting: Behavioral Health

## 2016-08-30 DIAGNOSIS — Z86711 Personal history of pulmonary embolism: Secondary | ICD-10-CM

## 2016-08-30 LAB — POCT INR: INR: 3

## 2016-08-30 NOTE — Progress Notes (Signed)
Pre visit review using our clinic review tool, if applicable. No additional management support is needed unless otherwise documented below in the visit note.  Patient came in office for INR check. He voiced adherence to medication & current regimen. Patient reported no changes or positive findings. Today's INR reading was 3.0.  Per Dr. Carollee Herter: Continue taking Coumadin 5 mg daily, except 7.5 mg on Monday & Wednesday. Return in 2 weeks for INR check.  Informed patient of the provider's recommendations. He verbalized understanding. Next appointment scheduled for 09/13/16 at 10:30 AM.

## 2016-08-30 NOTE — Progress Notes (Signed)
Reviewed  Shoua Ressler R Lowne Chase, DO  

## 2016-08-30 NOTE — Patient Instructions (Signed)
Per Dr. Carollee Herter: Continue taking Coumadin 5 mg daily, except 7.5 mg on Monday & Wednesday. Return in 2 weeks for INR check.

## 2016-09-13 ENCOUNTER — Ambulatory Visit (INDEPENDENT_AMBULATORY_CARE_PROVIDER_SITE_OTHER): Payer: Medicare HMO

## 2016-09-13 DIAGNOSIS — I2609 Other pulmonary embolism with acute cor pulmonale: Secondary | ICD-10-CM

## 2016-09-13 LAB — POCT INR: INR: 1.2

## 2016-09-13 NOTE — Progress Notes (Addendum)
Pre visit review using our clinic tool,if applicable. No additional management support is needed unless otherwise documented below in the visit note.   Patient in for INR check per order from Dr. Carollee Herter.   Patient has no complaints today however he has missed 3 doses of Coumadin due to having a tooth extraction.  Patients current dosage is Coumadin 5 mg on Sunday , Tuesday,Thursday,Friday and Saturday. Coumadin 7.5 mg on Monday ans Wednesday.  Patients INR today = 1.2 Goal = 2.0-3.0  Per Dr. Larose Kells patient to continue current dose of Coumadin and return for INR check in 2 weeks. Patient agreed. Appointment scheduled.  Missed doses , just restarted coumadin , agree w/ above Kathlene November, MD

## 2016-09-21 ENCOUNTER — Encounter: Payer: Medicare HMO | Admitting: Family Medicine

## 2016-09-27 ENCOUNTER — Ambulatory Visit: Payer: Medicare HMO

## 2016-09-27 ENCOUNTER — Ambulatory Visit (INDEPENDENT_AMBULATORY_CARE_PROVIDER_SITE_OTHER): Payer: Medicare HMO | Admitting: Behavioral Health

## 2016-09-27 DIAGNOSIS — Z86711 Personal history of pulmonary embolism: Secondary | ICD-10-CM

## 2016-09-27 LAB — POCT INR: INR: 2.6

## 2016-09-27 NOTE — Progress Notes (Signed)
Pre visit review using our clinic review tool, if applicable. No additional management support is needed unless otherwise documented below in the visit note.  Patient presents in clinic for INR check. He voiced adherence to medication & current regimen. Patient reported no changes or positive findings. Today's INR reading was 2.6.  Per Dr. Carollee Herter: Continue taking Coumadin 5 mg daily, except 7.5 mg on Monday & Wednesday. Return in 4 weeks for INR check.  Patient was made aware of the provider's recommendations & verbalized understanding. Next appointment 10/25/16 at 10:30 AM.

## 2016-09-27 NOTE — Patient Instructions (Signed)
Per Dr. Carollee Herter: Continue taking Coumadin 5 mg daily, except 7.5 mg on Monday & Wednesday. Return in 4 weeks for INR check.

## 2016-10-25 ENCOUNTER — Ambulatory Visit (INDEPENDENT_AMBULATORY_CARE_PROVIDER_SITE_OTHER): Payer: Medicare HMO | Admitting: Behavioral Health

## 2016-10-25 DIAGNOSIS — Z7901 Long term (current) use of anticoagulants: Secondary | ICD-10-CM | POA: Diagnosis not present

## 2016-10-25 LAB — POCT INR: INR: 2.3

## 2016-10-25 NOTE — Patient Instructions (Signed)
Continue taking Coumadin 5 mg daily, except 7.5 mg on Monday & Wednesday. Return in 4 weeks for INR check.

## 2016-10-25 NOTE — Progress Notes (Signed)
Pre visit review using our clinic review tool, if applicable. No additional management support is needed unless otherwise documented below in the visit note.  Patient presents in clinic for INR check. He voiced adherence to medication & current regimen. Patient reported no changes or positive findings. Today's INR reading was 2.3.  Advised patient to continue taking Coumadin 5 mg daily, except 7.5 mg on Monday & Wednesday. Return in 4 weeks for INR check.  Patient verbalized understanding. Next appointment 11/23/16 at 10:45 AM.

## 2016-11-10 NOTE — Progress Notes (Signed)
Cardiology Office Note   Date:  11/13/2016   ID:  Shawn [name], DOB 1934/07/04, MRN [id number]  PCP:  Carollee Herter, Alferd Apa, DO  Cardiologist:   Minus Breeding, MD   Chief Complaint  Patient presents with  . Dizziness     History of Present Illness:  Shawn [name] is a 82 y.o. male will present for office visit. He has past medical history of mild aortic valve sclerosis with cardiac murmur, hyperlipidemia, factor V Leiden mutation and history of DVT/PE on coumadin.   He was seen in July for near syncope.  His INR was subtherapeutic.  However, MRI  demonstrated no acute changes.  Since then he has had no further episodes.  He walks routinely.  He did a lot of work on a new townhouse he has moved into.  The patient denies any new symptoms such as chest discomfort, neck or arm discomfort. There has been no new shortness of breath, PND or orthopnea. There have been no reported palpitations, presyncope or syncope.   Past Medical History:  Diagnosis Date  . Allergy   . Cardiac murmur    mild aortic sclerosis  . Cataract    left eye  . Clotting disorder (HCC)    Factor v Leiden factor mutation  . Colon polyps    Hyperplastic  . Diverticulosis   . Dysphagia   . Factor V Leiden mutation ([place])   . Hemorrhoids   . History of blood clots   . Hyperlipidemia   . Osteoarthritis     Past Surgical History:  Procedure Laterality Date  . APPENDECTOMY    . CATARACT EXTRACTION  02/09/2007   right eye  . INGUINAL HERNIA REPAIR    . TONSILLECTOMY    . VARICOSE VEIN SURGERY    . VASECTOMY       Current Outpatient Prescriptions  Medication Sig Dispense Refill  . Cholecalciferol ([employer] VITAMIN D3) 1000 UNITS capsule Take 2,000 Units by mouth daily.     . Cyanocobalamin (VITAMIN B-12) 5000 MCG TBDP Take 1 tablet by mouth daily.    . folic acid (FOLVITE) 656 MCG tablet Take 400 mcg by mouth daily.    Marland Kitchen Hyaluronic Acid-Vitamin C (HYALURONIC ACID PO) Take 2  tablets by mouth daily. (200 mg)    . Multiple Vitamin (MULTIVITAMIN) tablet Take 1 tablet by mouth daily.      . niacin 500 MG tablet Take 500 mg by mouth at bedtime.    . Omega-3 Fatty Acids (FISH OIL) 1000 MG CPDR Take 4,000 mg by mouth daily. 1000 mg tablets    . saw palmetto 160 MG capsule Take 320 mg by mouth daily.     Marland Kitchen warfarin (COUMADIN) 5 MG tablet TAKE ONE TABLET BY MOUTH ONCE DAILY AS DIRECTED 90 tablet 1   No current facility-administered medications for this visit.     Allergies:   Niacin and Pravastatin    ROS:  Please see the history of present illness.   Otherwise, review of systems are positive for none.   All other systems are reviewed and negative.    PHYSICAL EXAM: VS:  BP 134/80   Pulse (!) 57   Ht 5\' 8"  (1.727 m)   Wt 173 lb 9.6 oz (78.7 kg)   BMI 26.40 kg/m  , BMI Body mass index is 26.4 kg/m.  GENERAL:  Well appearing NECK:  No jugular venous distention, waveform within normal limits, carotid upstroke brisk and symmetric, no bruits,  no thyromegaly LUNGS:  Clear to auscultation bilaterally BACK:  No CVA tenderness CHEST:  Unremarkable HEART:  PMI not displaced or sustained,S1 and S2 within normal limits, no S3, no S4, no clicks, no rubs, 2/6 brief apical systolic murmur at the apex, no diastolic murmurs ABD:  Flat, positive bowel sounds normal in frequency in pitch, no bruits, no rebound, no guarding, no midline pulsatile mass, no hepatomegaly, no splenomegaly EXT:  2 plus pulses throughout, no edema, no cyanosis no clubbing   EKG:  EKG is  ordered today. The ekg ordered today demonstrates NSR, rate 57, no acute ST T wave changes.     Recent Labs: 12/22/2015: ALT 16; BUN 14; Creatinine, Ser 0.97; Hemoglobin 16.9; Platelets 150.0; Potassium 4.6; Sodium 140    Lipid Panel    Component Value Date/Time   CHOL 196 12/22/2015 1029   TRIG 176.0 (H) 12/22/2015 1029   HDL 36.50 (L) 12/22/2015 1029   CHOLHDL 5 12/22/2015 1029   VLDL 35.2 12/22/2015 1029    LDLCALC 124 (H) 12/22/2015 1029   LDLDIRECT 86.0 06/17/2014 1008      Wt Readings from Last 3 Encounters:  11/12/16 173 lb 9.6 oz (78.7 kg)  03/22/16 177 lb (80.3 kg)  02/16/16 177 lb (80.3 kg)      Other studies Reviewed: Additional studies/ records that were reviewed today include: None Review of the above records demonstrates:     ASSESSMENT AND PLAN:  NEAR SYNCOPE:    He has had no further episodes.  No change in therapy is indicated. No further testing.   DVT/PE:  He tolerates warfarin and is followed by Carollee Herter, Alferd Apa, DO.  He is due ot have routine labs by Carollee Herter, Alferd Apa, DO next month.    AS:    This is again mild by exam.  No further imaging is indicated.   EDEMA:    This is mild.  No change in therapy.    Current medicines are reviewed at length with the patient today.  The patient does not have concerns regarding medicines.  The following changes have been made:    None  Labs/ tests ordered today include:    None    Orders Placed This Encounter  Procedures  . EKG 12-Lead     Disposition:   FU with me in 2 years.     Signed, Minus Breeding, MD  11/13/2016 9:47 PM    [name]

## 2016-11-12 ENCOUNTER — Ambulatory Visit (INDEPENDENT_AMBULATORY_CARE_PROVIDER_SITE_OTHER): Payer: Medicare HMO | Admitting: Cardiology

## 2016-11-12 ENCOUNTER — Encounter: Payer: Self-pay | Admitting: Cardiology

## 2016-11-12 VITALS — BP 134/80 | HR 57 | Ht 68.0 in | Wt 173.6 lb

## 2016-11-12 DIAGNOSIS — I359 Nonrheumatic aortic valve disorder, unspecified: Secondary | ICD-10-CM

## 2016-11-12 NOTE — Patient Instructions (Signed)
Your physician recommends that you schedule a follow-up appointment in: 2 Years.

## 2016-11-13 ENCOUNTER — Encounter: Payer: Self-pay | Admitting: Cardiology

## 2016-11-15 DIAGNOSIS — R69 Illness, unspecified: Secondary | ICD-10-CM | POA: Diagnosis not present

## 2016-11-23 ENCOUNTER — Ambulatory Visit (INDEPENDENT_AMBULATORY_CARE_PROVIDER_SITE_OTHER): Payer: Medicare HMO | Admitting: Behavioral Health

## 2016-11-23 DIAGNOSIS — Z23 Encounter for immunization: Secondary | ICD-10-CM

## 2016-11-23 DIAGNOSIS — Z7901 Long term (current) use of anticoagulants: Secondary | ICD-10-CM

## 2016-11-23 LAB — POCT INR: INR: 2

## 2016-11-23 NOTE — Progress Notes (Signed)
Reviewed  Yvonne R Lowne Chase, DO  

## 2016-11-23 NOTE — Patient Instructions (Signed)
Continue taking Coumadin 5 mg daily, except 7.5 mg on Monday & Wednesday. Return in 4 weeks for INR check.

## 2016-11-23 NOTE — Progress Notes (Signed)
Pre visit review using our clinic review tool, if applicable. No additional management support is needed unless otherwise documented below in the visit note.  Patient presents in clinic for INR check. He voiced adherence to medication & current regimen. All patient findings were negative. Today's INR reading was 2.0.  Advised patient to continue taking Coumadin 5 mg daily, except 7.5 mg on Monday & Wednesday. Return in 4 weeks for INR check. He verbalized understanding. Next appointment 12/24/16.  Also, patient was given influenza vaccination during the nurse visit. Patient tolerated the injection well. No s/s of a reaction before leaving the office.

## 2016-12-24 ENCOUNTER — Encounter: Payer: Self-pay | Admitting: Family Medicine

## 2016-12-24 ENCOUNTER — Ambulatory Visit (INDEPENDENT_AMBULATORY_CARE_PROVIDER_SITE_OTHER): Payer: Medicare HMO | Admitting: Family Medicine

## 2016-12-24 ENCOUNTER — Encounter: Payer: Medicare HMO | Admitting: Family Medicine

## 2016-12-24 VITALS — BP 130/70 | HR 65 | Temp 97.6°F | Resp 16 | Ht 68.0 in | Wt 177.0 lb

## 2016-12-24 DIAGNOSIS — I2782 Chronic pulmonary embolism: Secondary | ICD-10-CM | POA: Diagnosis not present

## 2016-12-24 DIAGNOSIS — I359 Nonrheumatic aortic valve disorder, unspecified: Secondary | ICD-10-CM | POA: Diagnosis not present

## 2016-12-24 DIAGNOSIS — Z0000 Encounter for general adult medical examination without abnormal findings: Secondary | ICD-10-CM | POA: Diagnosis not present

## 2016-12-24 DIAGNOSIS — N400 Enlarged prostate without lower urinary tract symptoms: Secondary | ICD-10-CM

## 2016-12-24 DIAGNOSIS — I2699 Other pulmonary embolism without acute cor pulmonale: Secondary | ICD-10-CM

## 2016-12-24 DIAGNOSIS — E785 Hyperlipidemia, unspecified: Secondary | ICD-10-CM | POA: Diagnosis not present

## 2016-12-24 LAB — POC URINALSYSI DIPSTICK (AUTOMATED)
Bilirubin, UA: NEGATIVE
Blood, UA: NEGATIVE
Glucose, UA: NEGATIVE
Ketones, UA: NEGATIVE
Leukocytes, UA: NEGATIVE
Nitrite, UA: NEGATIVE
Protein, UA: NEGATIVE
Spec Grav, UA: 1.025 (ref 1.010–1.025)
Urobilinogen, UA: 0.2 E.U./dL
pH, UA: 6 (ref 5.0–8.0)

## 2016-12-24 LAB — PSA: PSA: 2.51 ng/mL (ref 0.10–4.00)

## 2016-12-24 LAB — CBC WITH DIFFERENTIAL/PLATELET
Basophils Absolute: 0 10*3/uL (ref 0.0–0.1)
Basophils Relative: 0.7 % (ref 0.0–3.0)
Eosinophils Absolute: 0.1 10*3/uL (ref 0.0–0.7)
Eosinophils Relative: 1.7 % (ref 0.0–5.0)
HCT: 50.3 % (ref 39.0–52.0)
Hemoglobin: 17.2 g/dL — ABNORMAL HIGH (ref 13.0–17.0)
Lymphocytes Relative: 36 % (ref 12.0–46.0)
Lymphs Abs: 1.4 10*3/uL (ref 0.7–4.0)
MCHC: 34.1 g/dL (ref 30.0–36.0)
MCV: 100.1 fl — ABNORMAL HIGH (ref 78.0–100.0)
Monocytes Absolute: 0.3 10*3/uL (ref 0.1–1.0)
Monocytes Relative: 8.6 % (ref 3.0–12.0)
Neutro Abs: 2.1 10*3/uL (ref 1.4–7.7)
Neutrophils Relative %: 53 % (ref 43.0–77.0)
Platelets: 139 10*3/uL — ABNORMAL LOW (ref 150.0–400.0)
RBC: 5.03 Mil/uL (ref 4.22–5.81)
RDW: 13.1 % (ref 11.5–15.5)
WBC: 3.9 10*3/uL — ABNORMAL LOW (ref 4.0–10.5)

## 2016-12-24 LAB — LIPID PANEL
Cholesterol: 181 mg/dL (ref 0–200)
HDL: 35.9 mg/dL — ABNORMAL LOW (ref >39.00)
LDL Cholesterol: 109 mg/dL — ABNORMAL HIGH (ref 0–99)
NonHDL: 145.05
Total CHOL/HDL Ratio: 5
Triglycerides: 181 mg/dL — ABNORMAL HIGH (ref 0.0–149.0)
VLDL: 36.2 mg/dL (ref 0.0–40.0)

## 2016-12-24 LAB — COMPREHENSIVE METABOLIC PANEL
ALT: 13 U/L (ref 0–53)
AST: 20 U/L (ref 0–37)
Albumin: 4.3 g/dL (ref 3.5–5.2)
Alkaline Phosphatase: 49 U/L (ref 39–117)
BUN: 12 mg/dL (ref 6–23)
CO2: 34 mEq/L — ABNORMAL HIGH (ref 19–32)
Calcium: 9.6 mg/dL (ref 8.4–10.5)
Chloride: 101 mEq/L (ref 96–112)
Creatinine, Ser: 0.87 mg/dL (ref 0.40–1.50)
GFR: 89.75 mL/min (ref >60.00)
Glucose, Bld: 86 mg/dL (ref 70–99)
Potassium: 3.9 mEq/L (ref 3.5–5.1)
Sodium: 140 mEq/L (ref 135–145)
Total Bilirubin: 1.3 mg/dL — ABNORMAL HIGH (ref 0.2–1.2)
Total Protein: 7.1 g/dL (ref 6.0–8.3)

## 2016-12-24 MED ORDER — WARFARIN SODIUM 5 MG PO TABS: ORAL_TABLET | ORAL | 1 refills | Status: DC

## 2016-12-24 NOTE — Assessment & Plan Note (Signed)
Per cardiology 

## 2016-12-24 NOTE — Progress Notes (Signed)
Patient ID: Shawn [name], male   DOB: 1934-06-12, 82 y.o.   MRN: [id number]     Subjective:  I acted as a Education administrator for Dr. Carollee Herter.  Shawn [name], Shawn [name]   Patient ID: Shawn [name], male    DOB: Sep 11, 1934, 82 y.o.   MRN: [id number]  Chief Complaint  Patient presents with  . Annual Exam    HPI  Patient is in today for annual exam.  He has no other complaints.  Patient Care Team: Carollee Herter, Alferd Apa, DO as PCP - General Minus Breeding, MD as Consulting Physician (Cardiology) Hollar, Katharine Look, MD as Referring Physician (Dermatology) Warden Fillers, MD as Consulting Physician (Ophthalmology)   Past Medical History:  Diagnosis Date  . Allergy   . Cardiac murmur    mild aortic sclerosis  . Cataract    left eye  . Clotting disorder (HCC)    Factor v Leiden factor mutation  . Colon polyps    Hyperplastic  . Diverticulosis   . Dysphagia   . Factor V Leiden mutation ([place])   . Hemorrhoids   . History of blood clots   . Hyperlipidemia   . Osteoarthritis     Past Surgical History:  Procedure Laterality Date  . APPENDECTOMY    . CATARACT EXTRACTION  02/09/2007   right eye  . INGUINAL HERNIA REPAIR    . TONSILLECTOMY    . VARICOSE VEIN SURGERY    . VASECTOMY      Family History  Problem Relation Age of Onset  . Lung cancer Father   . Heart attack Mother 7  . Heart disease Mother        chf  . Arthritis Mother   . Colon cancer Neg Hx   . Esophageal cancer Neg Hx   . Rectal cancer Neg Hx   . Stomach cancer Neg Hx     Social History   Socioeconomic History  . Marital status: Married    Spouse name: Not on file  . Number of children: 4  . Years of education: Not on file  . Highest education level: Not on file  Social Needs  . Financial resource strain: Not on file  . Food insecurity - worry: Not on file  . Food insecurity - inability: Not on file  . Transportation needs - medical: Not on file  . Transportation needs -  non-medical: Not on file  Occupational History  . 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 Smoking status: Never Smoker  . Smokeless tobacco: Never Used  Substance and Sexual Activity  . Alcohol use: No    Alcohol/week: 0.0 oz  . Drug use: No  . Sexual activity: Yes    Partners: Female  Other Topics Concern  . Not on file  Social History Narrative   Daily caffeine    Exercise-- walk 1 mile a day and uses treadmill   Lives with wife.     Outpatient Medications Prior to Visit  Medication Sig Dispense Refill  . Cholecalciferol ([employer] VITAMIN D3) 1000 UNITS capsule Take 2,000 Units by mouth daily.     . Cyanocobalamin (VITAMIN B-12) 5000 MCG TBDP Take 1 tablet by mouth daily.    . folic acid (FOLVITE) 992 MCG tablet Take 400 mcg by mouth daily.    Marland Kitchen Hyaluronic Acid-Vitamin C (HYALURONIC ACID PO) Take 2 tablets by mouth daily. (200 mg)    . Multiple Vitamin (MULTIVITAMIN) tablet Take 1 tablet by mouth  daily.      . niacin 500 MG tablet Take 500 mg by mouth at bedtime.    . Omega-3 Fatty Acids (FISH OIL) 1000 MG CPDR Take 4,000 mg by mouth daily. 1000 mg tablets    . saw palmetto 160 MG capsule Take 320 mg by mouth daily.     Marland Kitchen warfarin (COUMADIN) 5 MG tablet TAKE ONE TABLET BY MOUTH ONCE DAILY AS DIRECTED 90 tablet 1   No facility-administered medications prior to visit.     Allergies  Allergen Reactions  . Niacin Rash    "My whole body turned red on the prescription dose" He takes the OTC medication  . Pravastatin Other (See Comments)    Disorientation    Review of Systems  Constitutional: Negative for chills, fever and malaise/fatigue.  HENT: Negative for congestion and hearing loss.   Eyes: Negative for blurred vision and discharge.  Respiratory: Negative for cough, sputum production and shortness of breath.   Cardiovascular: Negative for chest pain, palpitations and leg swelling.  Gastrointestinal: Negative for abdominal pain, blood in stool,  constipation, diarrhea, heartburn, nausea and vomiting.  Genitourinary: Negative for dysuria, frequency, hematuria and urgency.  Musculoskeletal: Negative for back pain, falls and myalgias.  Skin: Negative for rash.  Neurological: Negative for dizziness, sensory change, loss of consciousness, weakness and headaches.  Endo/Heme/Allergies: Negative for environmental allergies. Does not bruise/bleed easily.  Psychiatric/Behavioral: Negative for depression and suicidal ideas. The patient is not nervous/anxious and does not have insomnia.        Objective:    Physical Exam  Constitutional: He is oriented to person, place, and time. Vital signs are normal. He appears well-developed and well-nourished. He is sleeping. No distress.  HENT:  Head: Normocephalic and atraumatic.  Right Ear: External ear normal.  Left Ear: External ear normal.  Nose: Nose normal.  Mouth/Throat: Oropharynx is clear and moist. No oropharyngeal exudate.  Eyes: Conjunctivae and EOM are normal. Pupils are equal, round, and reactive to light. Right eye exhibits no discharge. Left eye exhibits no discharge.  Neck: Normal range of motion. Neck supple. No JVD present. No thyromegaly present.  Cardiovascular: Normal rate, regular rhythm and intact distal pulses. Exam reveals no gallop and no friction rub.  Murmur heard. 2/6 murmur  Pulmonary/Chest: Effort normal and breath sounds normal. No respiratory distress. He has no wheezes. He has no rales. He exhibits no tenderness.  Abdominal: Soft. Bowel sounds are normal. He exhibits no distension and no mass. There is no tenderness. There is no rebound and no guarding.  Genitourinary:  Genitourinary Comments: Pt refused He also refused ifob  Musculoskeletal: Normal range of motion. He exhibits no edema or tenderness.  Lymphadenopathy:    He has no cervical adenopathy.  Neurological: He is alert and oriented to person, place, and time. He displays normal reflexes. He exhibits  normal muscle tone.  Skin: Skin is warm and dry. No rash noted. He is not diaphoretic. No erythema. No pallor.  Psychiatric: He has a normal mood and affect. His behavior is normal. Judgment and thought content normal.  Nursing note and vitals reviewed.   BP 130/70 (BP Location: Left Arm, Cuff Size: Normal)   Pulse 65   Temp 97.6 F (36.4 C) (Oral)   Resp 16   Ht 5\' 8"  (1.727 m)   Wt 177 lb (80.3 kg)   SpO2 94%   BMI 26.91 kg/m  Wt Readings from Last 3 Encounters:  12/24/16 177 lb (80.3 kg)  11/12/16 173 lb  9.6 oz (78.7 kg)  03/22/16 177 lb (80.3 kg)   BP Readings from Last 3 Encounters:  12/24/16 130/70  11/12/16 134/80  03/22/16 (!) 151/81     Immunization History  Administered Date(s) Administered  . Influenza Split 11/17/2010, 11/29/2011  . Influenza Whole 11/12/2007, 11/17/2008, 11/09/2009  . Influenza, High Dose Seasonal PF 11/15/2014, 11/10/2015, 11/23/2016  . Influenza,inj,Quad PF,6+ Mos 10/30/2012, 11/13/2013, 02/01/2015  . Pneumococcal Conjugate-13 12/15/2013  . Pneumococcal Polysaccharide-23 09/04/2005, 01/21/2012  . Td 01/18/1999, 10/13/2008  . Tdap 01/21/2012  . Zoster 11/27/2006    Health Maintenance  Topic Date Due  . Samul Dada  01/20/2022  . INFLUENZA VACCINE  Completed  . PNA vac Low Risk Adult  Completed    Lab Results  Component Value Date   WBC 3.9 (L) 12/22/2015   HGB 16.9 12/22/2015   HCT 49.1 12/22/2015   PLT 150.0 12/22/2015   GLUCOSE 101 (H) 12/22/2015   CHOL 196 12/22/2015   TRIG 176.0 (H) 12/22/2015   HDL 36.50 (L) 12/22/2015   LDLDIRECT 86.0 06/17/2014   LDLCALC 124 (H) 12/22/2015   ALT 16 12/22/2015   AST 23 12/22/2015   NA 140 12/22/2015   K 4.6 12/22/2015   CL 101 12/22/2015   CREATININE 0.97 12/22/2015   BUN 14 12/22/2015   CO2 31 12/22/2015   TSH 1.66 08/11/2007   PSA 2.85 12/22/2015   INR 2.0 11/23/2016   MICROALBUR 0.7 11/15/2014    Lab Results  Component Value Date   TSH 1.66 08/11/2007   Lab Results    Component Value Date   WBC 3.9 (L) 12/22/2015   HGB 16.9 12/22/2015   HCT 49.1 12/22/2015   MCV 98.0 12/22/2015   PLT 150.0 12/22/2015   Lab Results  Component Value Date   NA 140 12/22/2015   K 4.6 12/22/2015   CO2 31 12/22/2015   GLUCOSE 101 (H) 12/22/2015   BUN 14 12/22/2015   CREATININE 0.97 12/22/2015   BILITOT 1.4 (H) 12/22/2015   ALKPHOS 49 12/22/2015   AST 23 12/22/2015   ALT 16 12/22/2015   PROT 7.0 12/22/2015   ALBUMIN 4.4 12/22/2015   CALCIUM 9.5 12/22/2015   GFR 79.37 12/22/2015   Lab Results  Component Value Date   CHOL 196 12/22/2015   Lab Results  Component Value Date   HDL 36.50 (L) 12/22/2015   Lab Results  Component Value Date   LDLCALC 124 (H) 12/22/2015   Lab Results  Component Value Date   TRIG 176.0 (H) 12/22/2015   Lab Results  Component Value Date   CHOLHDL 5 12/22/2015   No results found for: HGBA1C       Assessment & Plan:   Problem List Items Addressed This Visit      Unprioritized   Aortic valve disorder    Per cardiology      Relevant Medications   warfarin (COUMADIN) 5 MG tablet   Preventative health care - Primary    ghm utd Check labs See AVS      Pulmonary embolism (HCC)    INR 2.0  Cont coumadin and rto 1 month      Relevant Medications   warfarin (COUMADIN) 5 MG tablet    Other Visit Diagnoses    Hyperlipidemia LDL goal <100       Relevant Medications   warfarin (COUMADIN) 5 MG tablet   Other Relevant Orders   POCT Urinalysis Dipstick (Automated)   CBC with Differential/Platelet   Comprehensive metabolic panel   Lipid  panel   BPH without urinary obstruction       Relevant Orders   PSA      I am having [name] maintain his multivitamin, saw palmetto, Cholecalciferol, Hyaluronic Acid-Vitamin C (HYALURONIC ACID PO), niacin, Vitamin Y-51, Fish Oil, folic acid, and warfarin.  Meds ordered this encounter  Medications  . warfarin (COUMADIN) 5 MG tablet    Sig: TAKE ONE TABLET  BY MOUTH ONCE DAILY AS DIRECTED    Dispense:  90 tablet    Refill:  1    CMA served as scribe during this visit. History, Physical and Plan performed by medical provider. Documentation and orders reviewed and attested to.  Ann Held, DO

## 2016-12-24 NOTE — Assessment & Plan Note (Signed)
ghm utd Check labs See AVS 

## 2016-12-24 NOTE — Assessment & Plan Note (Signed)
Encouraged heart healthy diet, increase exercise, avoid trans fats, consider a krill oil cap daily 

## 2016-12-24 NOTE — Assessment & Plan Note (Signed)
INR 2.0  Cont coumadin and rto 1 month

## 2016-12-24 NOTE — Patient Instructions (Signed)
Preventive Care 65 Years and Older, Male Preventive care refers to lifestyle choices and visits with your health care provider that can promote health and wellness. What does preventive care include?  A yearly physical exam. This is also called an annual well check.  Dental exams once or twice a year.  Routine eye exams. Ask your health care provider how often you should have your eyes checked.  Personal lifestyle choices, including: ? Daily care of your teeth and gums. ? Regular physical activity. ? Eating a healthy diet. ? Avoiding tobacco and drug use. ? Limiting alcohol use. ? Practicing safe sex. ? Taking low doses of aspirin every day. ? Taking vitamin and mineral supplements as recommended by your health care provider. What happens during an annual well check? The services and screenings done by your health care provider during your annual well check will depend on your age, overall health, lifestyle risk factors, and family history of disease. Counseling Your health care provider may ask you questions about your:  Alcohol use.  Tobacco use.  Drug use.  Emotional well-being.  Home and relationship well-being.  Sexual activity.  Eating habits.  History of falls.  Memory and ability to understand (cognition).  Work and work environment.  Screening You may have the following tests or measurements:  Height, weight, and BMI.  Blood pressure.  Lipid and cholesterol levels. These may be checked every 5 years, or more frequently if you are over 50 years old.  Skin check.  Lung cancer screening. You may have this screening every year starting at age 55 if you have a 30-pack-year history of smoking and currently smoke or have quit within the past 15 years.  Fecal occult blood test (FOBT) of the stool. You may have this test every year starting at age 50.  Flexible sigmoidoscopy or colonoscopy. You may have a sigmoidoscopy every 5 years or a colonoscopy every 10  years starting at age 50.  Prostate cancer screening. Recommendations will vary depending on your family history and other risks.  Hepatitis C blood test.  Hepatitis B blood test.  Sexually transmitted disease (STD) testing.  Diabetes screening. This is done by checking your blood sugar (glucose) after you have not eaten for a while (fasting). You may have this done every 1-3 years.  Abdominal aortic aneurysm (AAA) screening. You may need this if you are a current or former smoker.  Osteoporosis. You may be screened starting at age 70 if you are at high risk.  Talk with your health care provider about your test results, treatment options, and if necessary, the need for more tests. Vaccines Your health care provider may recommend certain vaccines, such as:  Influenza vaccine. This is recommended every year.  Tetanus, diphtheria, and acellular pertussis (Tdap, Td) vaccine. You may need a Td booster every 10 years.  Varicella vaccine. You may need this if you have not been vaccinated.  Zoster vaccine. You may need this after age 60.  Measles, mumps, and rubella (MMR) vaccine. You may need at least one dose of MMR if you were born in 1957 or later. You may also need a second dose.  Pneumococcal 13-valent conjugate (PCV13) vaccine. One dose is recommended after age 80.  Pneumococcal polysaccharide (PPSV23) vaccine. One dose is recommended after age 80.  Meningococcal vaccine. You may need this if you have certain conditions.  Hepatitis A vaccine. You may need this if you have certain conditions or if you travel or work in places where you   may be exposed to hepatitis A.  Hepatitis B vaccine. You may need this if you have certain conditions or if you travel or work in places where you may be exposed to hepatitis B.  Haemophilus influenzae type b (Hib) vaccine. You may need this if you have certain risk factors.  Talk to your health care provider about which screenings and vaccines  you need and how often you need them. This information is not intended to replace advice given to you by your health care provider. Make sure you discuss any questions you have with your health care provider. Document Released: 02/18/2015 Document Revised: 10/12/2015 Document Reviewed: 11/23/2014 Elsevier Interactive Patient Education  2017 [employer].

## 2016-12-31 ENCOUNTER — Other Ambulatory Visit: Payer: Self-pay | Admitting: Family Medicine

## 2016-12-31 DIAGNOSIS — D729 Disorder of white blood cells, unspecified: Secondary | ICD-10-CM

## 2016-12-31 DIAGNOSIS — E785 Hyperlipidemia, unspecified: Secondary | ICD-10-CM

## 2017-01-02 ENCOUNTER — Other Ambulatory Visit: Payer: Self-pay | Admitting: Family Medicine

## 2017-01-25 ENCOUNTER — Ambulatory Visit (INDEPENDENT_AMBULATORY_CARE_PROVIDER_SITE_OTHER): Payer: Medicare HMO

## 2017-01-25 DIAGNOSIS — Z7901 Long term (current) use of anticoagulants: Secondary | ICD-10-CM

## 2017-01-25 LAB — POCT INR: INR: 2.1

## 2017-01-25 NOTE — Progress Notes (Signed)
Pre visit review using our clinic tool,if applicable. No additional management support is needed unless otherwise documented below in the visit note.   Patient in for INR check per order from [facility].   INR last visit was 2.0  Patient c.urrently taking Coumadin 5 mg daily except 7.5 mg on Monday and Wednesday  No complaints voiced this visit.  INR today = 2.0  Per Dr.Lowne-Chase patient to continue medications as ordered. Return for INR re-check in 1 month.   Appointment scheduled for patient

## 2017-01-25 NOTE — Patient Instructions (Signed)
Per Dr. Carollee Herter continue takin Coumadin 5 mg daily except 7.5 mg on Monday and Wednesday. Return for INR check in February 26, 2017.

## 2017-01-25 NOTE — Progress Notes (Signed)
Reviewed  Yvonne R Lowne Chase, DO  

## 2017-02-07 DIAGNOSIS — H02831 Dermatochalasis of right upper eyelid: Secondary | ICD-10-CM | POA: Diagnosis not present

## 2017-02-07 DIAGNOSIS — H524 Presbyopia: Secondary | ICD-10-CM | POA: Diagnosis not present

## 2017-02-07 DIAGNOSIS — H10413 Chronic giant papillary conjunctivitis, bilateral: Secondary | ICD-10-CM | POA: Diagnosis not present

## 2017-02-07 DIAGNOSIS — H04123 Dry eye syndrome of bilateral lacrimal glands: Secondary | ICD-10-CM | POA: Diagnosis not present

## 2017-02-07 DIAGNOSIS — H02834 Dermatochalasis of left upper eyelid: Secondary | ICD-10-CM | POA: Diagnosis not present

## 2017-02-07 DIAGNOSIS — Z961 Presence of intraocular lens: Secondary | ICD-10-CM | POA: Diagnosis not present

## 2017-02-26 ENCOUNTER — Ambulatory Visit (INDEPENDENT_AMBULATORY_CARE_PROVIDER_SITE_OTHER): Payer: Medicare HMO

## 2017-02-26 DIAGNOSIS — Z7901 Long term (current) use of anticoagulants: Secondary | ICD-10-CM

## 2017-02-26 LAB — POCT INR: INR: 2.2

## 2017-02-26 NOTE — Patient Instructions (Signed)
Patient currently taking Coumadin 5 mg daily except for Monday and Wednesday taking 7.5mg .  Per Dr. Carollee Herter patient to continue taking Coumadin as ordered previously and return in 1 month

## 2017-02-26 NOTE — Progress Notes (Signed)
Pre visit review using our clinic tool,if applicable. No additional management support is needed unless otherwise documented below in the visit note.   Patient in for INR check per order from Dr. Carollee Herter.  Patient findings negative.  Goal = 2.0-3.0  INR today = 2.2  Patient currently taking Coumadin 5 mg daily except for Monday and Wednesday taking 7.5mg .  Per Dr. Carollee Herter patient to continue taking Coumadin as ordered previously and return in 1 month

## 2017-04-01 ENCOUNTER — Telehealth: Payer: Self-pay | Admitting: Family Medicine

## 2017-04-01 NOTE — Telephone Encounter (Signed)
Called pt to confirm appt on 04/02/17. Pt stated that he would like to file a complaint about having his appt made for the wrong date. He states that the card that was filled out by the nurse has 03/29/17 written, but the appt was actually made for 03/23/17. Pt came for his appt and was made aware of the mistake, but stated that the weather was bad and if he had missed his appt he would be charged $50.00.

## 2017-04-02 ENCOUNTER — Ambulatory Visit (INDEPENDENT_AMBULATORY_CARE_PROVIDER_SITE_OTHER): Payer: Medicare HMO

## 2017-04-02 DIAGNOSIS — Z7901 Long term (current) use of anticoagulants: Secondary | ICD-10-CM | POA: Diagnosis not present

## 2017-04-02 LAB — POCT INR: INR: 2.1

## 2017-04-02 NOTE — Patient Instructions (Signed)
Continue Coumadin 5mg  daily except for 7.5mg  on Monday and Wednesday. Return in 1 month for INR check.

## 2017-04-02 NOTE — Progress Notes (Signed)
Pre visit review using our clinic review tool, if applicable. No additional management support is needed unless otherwise documented below in the visit note.   Patient in for INR check per order from Dr. Carollee Herter.   Patient findings negative. Goal = 2.0-3.0.   INR today= 2.1  Patient currently taking Coumadin 5mg  daily except for Monday and Wednesday taking 7.5mg .  Per Dr. Etter Sjogren patient to continue taking Coumadin as previously ordered and return in 1 month.

## 2017-05-06 DIAGNOSIS — R69 Illness, unspecified: Secondary | ICD-10-CM | POA: Diagnosis not present

## 2017-05-07 ENCOUNTER — Ambulatory Visit (INDEPENDENT_AMBULATORY_CARE_PROVIDER_SITE_OTHER): Payer: Medicare HMO | Admitting: *Deleted

## 2017-05-07 DIAGNOSIS — Z7901 Long term (current) use of anticoagulants: Secondary | ICD-10-CM | POA: Diagnosis not present

## 2017-05-07 LAB — POCT INR: INR: 2.3

## 2017-05-07 NOTE — Progress Notes (Signed)
Pre visit review using our clinic review tool, if applicable. No additional management support is needed unless otherwise documented below in the visit note.   Patient here for INR check per Dr. Carollee Herter  Goal =2.0-3.0 Last INR 2.1  Patient currently taking 5mg  Coumadin daily except Monday and Wednesday taking 7.5mg   No missed doses, No recent antibiotics, No dietary changes, no abnormal bleeding or bruising.  INR today 2.3  Per Dr. Etter Sjogren patient is to continue current dose of Coumadin as ordered previously and return in 1 mont. Appointment has been scheduled.

## 2017-05-07 NOTE — Progress Notes (Signed)
Reviewed  Kweku Stankey R Lowne Chase, DO  

## 2017-05-15 DIAGNOSIS — N529 Male erectile dysfunction, unspecified: Secondary | ICD-10-CM | POA: Diagnosis not present

## 2017-05-15 DIAGNOSIS — Z825 Family history of asthma and other chronic lower respiratory diseases: Secondary | ICD-10-CM | POA: Diagnosis not present

## 2017-05-15 DIAGNOSIS — K08409 Partial loss of teeth, unspecified cause, unspecified class: Secondary | ICD-10-CM | POA: Diagnosis not present

## 2017-05-15 DIAGNOSIS — Z8249 Family history of ischemic heart disease and other diseases of the circulatory system: Secondary | ICD-10-CM | POA: Diagnosis not present

## 2017-05-15 DIAGNOSIS — Z7901 Long term (current) use of anticoagulants: Secondary | ICD-10-CM | POA: Diagnosis not present

## 2017-05-15 DIAGNOSIS — R69 Illness, unspecified: Secondary | ICD-10-CM | POA: Diagnosis not present

## 2017-05-15 DIAGNOSIS — Z86711 Personal history of pulmonary embolism: Secondary | ICD-10-CM | POA: Diagnosis not present

## 2017-05-15 DIAGNOSIS — Z809 Family history of malignant neoplasm, unspecified: Secondary | ICD-10-CM | POA: Diagnosis not present

## 2017-05-15 DIAGNOSIS — H04129 Dry eye syndrome of unspecified lacrimal gland: Secondary | ICD-10-CM | POA: Diagnosis not present

## 2017-06-02 ENCOUNTER — Other Ambulatory Visit: Payer: Self-pay | Admitting: Family Medicine

## 2017-06-02 DIAGNOSIS — I2699 Other pulmonary embolism without acute cor pulmonale: Secondary | ICD-10-CM

## 2017-06-11 ENCOUNTER — Ambulatory Visit (INDEPENDENT_AMBULATORY_CARE_PROVIDER_SITE_OTHER): Payer: Medicare HMO | Admitting: *Deleted

## 2017-06-11 DIAGNOSIS — Z7901 Long term (current) use of anticoagulants: Secondary | ICD-10-CM

## 2017-06-11 LAB — POCT INR: INR: 2.1

## 2017-06-11 NOTE — Progress Notes (Signed)
Pt here for INR check per Dr Carollee Herter.  Goal INR = 2.0 - 3.0  Last INR= 2.3  INR today = 2.1  Pt currently takes Coumadin 7.5mg  on Monday and Wednesday and 5mg  on all other days.  Pt denies recent antibiotics, no dietary changes, no unusual bruising / bleeding.  Advised patient per PCP to continue current dose and return for INR check in 1 month. Appointment scheduled for 07/16/17 at 10:30am.

## 2017-06-11 NOTE — Progress Notes (Signed)
Reviewed  Geraline Halberstadt R Lowne Chase, DO  

## 2017-07-16 ENCOUNTER — Ambulatory Visit (INDEPENDENT_AMBULATORY_CARE_PROVIDER_SITE_OTHER): Payer: Medicare HMO

## 2017-07-16 DIAGNOSIS — Z7901 Long term (current) use of anticoagulants: Secondary | ICD-10-CM | POA: Diagnosis not present

## 2017-07-16 LAB — POCT INR: INR: 2.1 (ref 2.0–3.0)

## 2017-07-16 NOTE — Progress Notes (Signed)
Pre visit review using our clinic tool,if applicable. No additional management support is needed unless otherwise documented below in the visit note.   Pt here for INR check per order from Dr. Roma Schanz dated 06/11/2017.  Goal INR = 2.0-3.0  Last INR =2.1  Pt currently takes Coumadin 7.5 mg on Mondays and Wednesdays and 5 mg all other days.  Pt denies recent antibiotics, no dietary changes and no unusual bruising / bleeding.  INR today = 2.1  Pt advised per Dr. Carollee Herter  To continue taking Coumadin as ordered and return for INR check in 1 month. Patient agreed appointment scheduled.

## 2017-07-16 NOTE — Progress Notes (Signed)
Reviewed.  Agree.  Ann Held, DO

## 2017-07-16 NOTE — Patient Instructions (Signed)
Pt advised per Dr. Carollee Herter  To continue taking Coumadin as ordered and return for INR check in 1 month. Patient agreed appointment scheduled.

## 2017-08-20 ENCOUNTER — Ambulatory Visit (INDEPENDENT_AMBULATORY_CARE_PROVIDER_SITE_OTHER): Payer: Medicare HMO

## 2017-08-20 DIAGNOSIS — Z7901 Long term (current) use of anticoagulants: Secondary | ICD-10-CM

## 2017-08-20 LAB — POCT INR: INR: 2.5 (ref 2.0–3.0)

## 2017-08-20 NOTE — Progress Notes (Signed)
Reviewed  Yvonne R Lowne Chase, DO  

## 2017-08-20 NOTE — Patient Instructions (Addendum)
Patient to continue taking Coumadin as ordered and return on September 24, 2017 @ 10:45.

## 2017-08-20 NOTE — Progress Notes (Signed)
Pre visit review using our clinic tool,if applicable. No additional management support is needed unless otherwise documented below in the visit note.   Pt here for INR check per  Goal INR = 2.0-30.  Last INR =2.1  Pt currently takes Coumadin 5 mg and 7.5 mg  Pt denies recent antibiotics, no dietary changes and no unusual bruising / bleeding.  INR today = 2.5  Pt advised per Dr. Carollee Herter no changes and patient to return in 1 month.

## 2017-08-26 ENCOUNTER — Ambulatory Visit: Payer: Self-pay

## 2017-08-26 NOTE — Telephone Encounter (Signed)
Pt.'s wife reports they noticed "a lump on a vein on his left knee, inner aspect. About 2 inches in diameter. Some smaller lumps noticed as well." No redness pr pain. Concerned because he is on a blood thinner. Appointment made for tomorrow. If symptoms worsen will go to ED. Denies chest pain or shortness of breath.  Reason for Disposition . [1] Small swelling or lump AND [2] unexplained AND [3] present > 1 week  Answer Assessment - Initial Assessment Questions 1. APPEARANCE of SWELLING: "What does it look like?" (e.g., lymph node, insect bite, mole)     Lump on a vein on left leg at the knee 2. SIZE: "How large is the swelling?" (inches, cm or compare to coins)      2 inches across 3. LOCATION: "Where is the swelling located?"     Left knee inner aspect 4. ONSET: "When did the swelling start?"     Noticed it last week 5. PAIN: "Is it painful?" If so, ask: "How much?"     No 6. ITCH: "Does it itch?" If so, ask: "How much?"     No 7. CAUSE: "What do you think caused the swelling?"     Unsure 8. OTHER SYMPTOMS: "Do you have any other symptoms?" (e.g., fever)     No  Protocols used: SKIN LUMP OR LOCALIZED SWELLING-A-AH

## 2017-08-27 ENCOUNTER — Encounter: Payer: Self-pay | Admitting: Family Medicine

## 2017-08-27 ENCOUNTER — Ambulatory Visit (INDEPENDENT_AMBULATORY_CARE_PROVIDER_SITE_OTHER): Payer: Medicare HMO | Admitting: Family Medicine

## 2017-08-27 VITALS — BP 135/66 | HR 71 | Temp 98.2°F | Resp 16 | Ht 68.0 in | Wt 178.6 lb

## 2017-08-27 DIAGNOSIS — I83813 Varicose veins of bilateral lower extremities with pain: Secondary | ICD-10-CM

## 2017-08-27 NOTE — Patient Instructions (Signed)
Varicose Veins Varicose veins are veins that have become enlarged and twisted. They are usually seen in the legs but can occur in other parts of the body as well. What are the causes? This condition is the result of valves in the veins not working properly. Valves in the veins help to return blood from the leg to the heart. If these valves are damaged, blood flows backward and backs up into the veins in the leg near the skin. This causes the veins to become larger. What increases the risk? People who are on their feet a lot, who are pregnant, or who are overweight are more likely to develop varicose veins. What are the signs or symptoms?  Bulging, twisted-appearing, bluish veins, most commonly found on the legs.  Leg pain or a feeling of heaviness. These symptoms may be worse at the end of the day.  Leg swelling.  Changes in skin color. How is this diagnosed? A health care provider can usually diagnose varicose veins by examining your legs. Your health care provider may also recommend an ultrasound of your leg veins. How is this treated? Most varicose veins can be treated at home.However, other treatments are available for people who have persistent symptoms or want to improve the cosmetic appearance of the varicose veins. These treatment options include:  Sclerotherapy. A solution is injected into the vein to close it off.  Laser treatment. A laser is used to heat the vein to close it off.  Radiofrequency vein ablation. An electrical current produced by radio waves is used to close off the vein.  Phlebectomy. The vein is surgically removed through small incisions made over the varicose vein.  Vein ligation and stripping. The vein is surgically removed through incisions made over the varicose vein after the vein has been tied (ligated). Follow these instructions at home:   Do not stand or sit in one position for long periods of time. Do not sit with your legs crossed. Rest with your  legs raised during the day.  Wear compression stockings as directed by your health care provider. These stockings help to prevent blood clots and reduce swelling in your legs.  Do not wear other tight, encircling garments around your legs, pelvis, or waist.  Walk as much as possible to increase blood flow.  Raise the foot of your bed at night with 2-inch blocks.  If you get a cut in the skin over the vein and the vein bleeds, lie down with your leg raised and press on it with a clean cloth until the bleeding stops. Then place a bandage (dressing) on the cut. See your health care provider if it continues to bleed. Contact a health care provider if:  The skin around your ankle starts to break down.  You have pain, redness, tenderness, or hard swelling in your leg over a vein.  You are uncomfortable because of leg pain. This information is not intended to replace advice given to you by your health care provider. Make sure you discuss any questions you have with your health care provider. Document Released: 11/01/2004 Document Revised: 06/30/2015 Document Reviewed: 07/26/2015 Elsevier Interactive Patient Education  2017 Elsevier Inc.  

## 2017-08-27 NOTE — Progress Notes (Signed)
Patient ID: Shawn [name], male   DOB: [birth year], 83 y.o.   MRN: [id number]     Subjective:  I acted as a Education administrator for Dr. Carollee Herter.  [name], [place]   Patient ID: Shawn [name], male    DOB: February 10, 1934, 83 y.o.   MRN: [id number]  Chief Complaint  Patient presents with  . Leg Swelling    left knee    HPI  Patient is in today for swelling in left knee and veins.  Wife noticed last week.  No pain.  Has history of blood clot in lung.  He is on warfarin.    Patient Care Team: Carollee Herter, Alferd Apa, DO as PCP - General Minus Breeding, MD as Consulting Physician (Cardiology) Hollar, Katharine Look, MD as Referring Physician (Dermatology) Warden Fillers, MD as Consulting Physician (Ophthalmology)   Past Medical History:  Diagnosis Date  . Allergy   . Cardiac murmur    mild aortic sclerosis  . Cataract    left eye  . Clotting disorder (HCC)    Factor v Leiden factor mutation  . Colon polyps    Hyperplastic  . Diverticulosis   . Dysphagia   . Factor V Leiden mutation ([place])   . Hemorrhoids   . History of blood clots   . Hyperlipidemia   . Osteoarthritis     Past Surgical History:  Procedure Laterality Date  . APPENDECTOMY    . CATARACT EXTRACTION  02/09/2007   right eye  . INGUINAL HERNIA REPAIR    . TONSILLECTOMY    . VARICOSE VEIN SURGERY    . VASECTOMY      Family History  Problem Relation Age of Onset  . Lung cancer Father   . Heart attack Mother 41  . Heart disease Mother        chf  . Arthritis Mother   . Colon cancer Neg Hx   . Esophageal cancer Neg Hx   . Rectal cancer Neg Hx   . Stomach cancer Neg Hx     Social History   Socioeconomic History  . Marital status: Married    Spouse name: Not on file  . Number of children: 4  . Years of education: Not on file  . Highest education level: Not on file  Occupational History  . 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 Financial resource  strain: Not on file  . Food insecurity:    Worry: Not on file    Inability: Not on file  . Transportation needs:    Medical: Not on file    Non-medical: Not on file  Tobacco Use  . Smoking status: Never Smoker  . Smokeless tobacco: Never Used  Substance and Sexual Activity  . Alcohol use: No    Alcohol/week: 0.0 oz  . Drug use: No  . Sexual activity: Yes    Partners: Female  Lifestyle  . Physical activity:    Days per week: Not on file    Minutes per session: Not on file  . Stress: Not on file  Relationships  . Social connections:    Talks on phone: Not on file    Gets together: Not on file    Attends religious service: Not on file    Active member of club or organization: Not on file    Attends meetings of clubs or organizations: Not on file    Relationship status: Not on file  . Intimate partner violence:  Fear of current or ex partner: Not on file    Emotionally abused: Not on file    Physically abused: Not on file    Forced sexual activity: Not on file  Other Topics Concern  . Not on file  Social History Narrative   Daily caffeine    Exercise-- walk 1 mile a day and uses treadmill   Lives with wife.     Outpatient Medications Prior to Visit  Medication Sig Dispense Refill  . Cholecalciferol ([employer] VITAMIN D3) 1000 UNITS capsule Take 2,000 Units by mouth daily.     . Cyanocobalamin (VITAMIN B-12) 5000 MCG TBDP Take 1 tablet by mouth daily.    . folic acid (FOLVITE) 299 MCG tablet Take 400 mcg by mouth daily.    Marland Kitchen Hyaluronic Acid-Vitamin C (HYALURONIC ACID PO) Take 2 tablets by mouth daily. (200 mg)    . Multiple Vitamin (MULTIVITAMIN) tablet Take 1 tablet by mouth daily.      . niacin 500 MG tablet Take 500 mg by mouth at bedtime.    . Omega-3 Fatty Acids (FISH OIL) 1000 MG CPDR Take 4,000 mg by mouth daily. 1000 mg tablets    . saw palmetto 160 MG capsule Take 320 mg by mouth daily.     Marland Kitchen warfarin (COUMADIN) 5 MG tablet TAKE 1 TABLET BY MOUTH EVERY DAY AS  DIRECTED 90 tablet 1   No facility-administered medications prior to visit.     Allergies  Allergen Reactions  . Niacin Rash    "My whole body turned red on the prescription dose" He takes the OTC medication  . Pravastatin Other (See Comments)    Disorientation    Review of Systems  Constitutional: Negative for fever and malaise/fatigue.  HENT: Negative for congestion.   Eyes: Negative for blurred vision.  Respiratory: Negative for cough and shortness of breath.   Cardiovascular: Positive for leg swelling (left knee). Negative for chest pain and palpitations.  Gastrointestinal: Negative for vomiting.  Musculoskeletal: Negative for back pain.  Skin: Negative for rash.  Neurological: Negative for loss of consciousness and headaches.       Objective:    Physical Exam  Constitutional: He is oriented to person, place, and time. Vital signs are normal. He appears well-developed and well-nourished. He is sleeping.  HENT:  Head: Normocephalic and atraumatic.  Mouth/Throat: Oropharynx is clear and moist.  Eyes: Pupils are equal, round, and reactive to light. EOM are normal.  Neck: Normal range of motion. Neck supple. No thyromegaly present.  Cardiovascular: Normal rate and regular rhythm.  No murmur heard. Pulmonary/Chest: Effort normal and breath sounds normal. No respiratory distress. He has no wheezes. He has no rales. He exhibits no tenderness.  Musculoskeletal: He exhibits no edema or tenderness.       Legs: Neurological: He is alert and oriented to person, place, and time.  Skin: Skin is warm and dry.  Psychiatric: He has a normal mood and affect. His behavior is normal. Judgment and thought content normal.  Nursing note and vitals reviewed.   BP 135/66 (BP Location: Right Arm, Cuff Size: Normal)   Pulse 71   Temp 98.2 F (36.8 C) (Oral)   Resp 16   Ht 5\' 8"  (1.727 m)   Wt 178 lb 9.6 oz (81 kg)   SpO2 95%   BMI 27.16 kg/m  Wt Readings from Last 3 Encounters:    08/27/17 178 lb 9.6 oz (81 kg)  12/24/16 177 lb (80.3 kg)  11/12/16 173 lb  9.6 oz (78.7 kg)   BP Readings from Last 3 Encounters:  08/27/17 135/66  12/24/16 130/70  11/12/16 134/80     Immunization History  Administered Date(s) Administered  . Influenza Split 11/17/2010, 11/29/2011  . Influenza Whole 11/12/2007, 11/17/2008, 11/09/2009  . Influenza, High Dose Seasonal PF 11/15/2014, 11/10/2015, 11/23/2016  . Influenza,inj,Quad PF,6+ Mos 10/30/2012, 11/13/2013, 02/01/2015  . Pneumococcal Conjugate-13 12/15/2013  . Pneumococcal Polysaccharide-23 09/04/2005, 01/21/2012  . Td 01/18/1999, 10/13/2008  . Tdap 01/21/2012  . Zoster 11/27/2006    Health Maintenance  Topic Date Due  . INFLUENZA VACCINE  09/05/2017  . TETANUS/TDAP  01/20/2022  . PNA vac Low Risk Adult  Completed    Lab Results  Component Value Date   WBC 3.9 (L) 12/24/2016   HGB 17.2 (H) 12/24/2016   HCT 50.3 12/24/2016   PLT 139.0 (L) 12/24/2016   GLUCOSE 86 12/24/2016   CHOL 181 12/24/2016   TRIG 181.0 (H) 12/24/2016   HDL 35.90 (L) 12/24/2016   LDLDIRECT 86.0 06/17/2014   LDLCALC 109 (H) 12/24/2016   ALT 13 12/24/2016   AST 20 12/24/2016   NA 140 12/24/2016   K 3.9 12/24/2016   CL 101 12/24/2016   CREATININE 0.87 12/24/2016   BUN 12 12/24/2016   CO2 34 (H) 12/24/2016   TSH 1.66 08/11/2007   PSA 2.51 12/24/2016   INR 2.5 08/20/2017   MICROALBUR 0.7 11/15/2014    Lab Results  Component Value Date   TSH 1.66 08/11/2007   Lab Results  Component Value Date   WBC 3.9 (L) 12/24/2016   HGB 17.2 (H) 12/24/2016   HCT 50.3 12/24/2016   MCV 100.1 (H) 12/24/2016   PLT 139.0 (L) 12/24/2016   Lab Results  Component Value Date   NA 140 12/24/2016   K 3.9 12/24/2016   CO2 34 (H) 12/24/2016   GLUCOSE 86 12/24/2016   BUN 12 12/24/2016   CREATININE 0.87 12/24/2016   BILITOT 1.3 (H) 12/24/2016   ALKPHOS 49 12/24/2016   AST 20 12/24/2016   ALT 13 12/24/2016   PROT 7.1 12/24/2016   ALBUMIN 4.3  12/24/2016   CALCIUM 9.6 12/24/2016   GFR 89.75 12/24/2016   Lab Results  Component Value Date   CHOL 181 12/24/2016   Lab Results  Component Value Date   HDL 35.90 (L) 12/24/2016   Lab Results  Component Value Date   LDLCALC 109 (H) 12/24/2016   Lab Results  Component Value Date   TRIG 181.0 (H) 12/24/2016   Lab Results  Component Value Date   CHOLHDL 5 12/24/2016   No results found for: HGBA1C       Assessment & Plan:   Problem List Items Addressed This Visit    None    Visit Diagnoses    Varicose veins of both lower extremities with pain    -  Primary   Relevant Orders   US Venous Img Lower Unilateral Left                con't warfarin             Check [place]  Consider vascular referral if worsens    I am having Lani Amador Cunas maintain his multivitamin, saw palmetto, Cholecalciferol, Hyaluronic Acid-Vitamin C (HYALURONIC ACID PO), niacin, Vitamin H-82, Fish Oil, folic acid, and warfarin.  No orders of the defined types were placed in this encounter.   CMA served as Education administrator during this visit. History, Physical and Plan performed by medical provider.  Documentation and orders reviewed and attested to.  Ann Held, DO

## 2017-08-29 ENCOUNTER — Ambulatory Visit (HOSPITAL_BASED_OUTPATIENT_CLINIC_OR_DEPARTMENT_OTHER)
Admission: RE | Admit: 2017-08-29 | Discharge: 2017-08-29 | Disposition: A | Discharge status: Home / Self Care | Payer: Medicare HMO | Source: Ambulatory Visit | Attending: Family Medicine | Admitting: Family Medicine

## 2017-08-29 DIAGNOSIS — I83813 Varicose veins of bilateral lower extremities with pain: Secondary | ICD-10-CM | POA: Insufficient documentation

## 2017-08-29 DIAGNOSIS — R600 Localized edema: Secondary | ICD-10-CM | POA: Diagnosis not present

## 2017-09-02 ENCOUNTER — Telehealth: Payer: Self-pay

## 2017-09-02 NOTE — Telephone Encounter (Signed)
-----   Message from Ann Held, DO sent at 08/29/2017 12:20 PM EDT ----- No clots---- we can refer to vascular if he wants the varicose veins take care of

## 2017-09-02 NOTE — Telephone Encounter (Signed)
Author phoned pt. to review imaging results, and pt. Verbalized understanding. Author offered vascular surgeon consult for varicose veins per Dr. Etter Sjogren, but pt. Declined at this time.

## 2017-09-24 ENCOUNTER — Ambulatory Visit (INDEPENDENT_AMBULATORY_CARE_PROVIDER_SITE_OTHER): Payer: Medicare HMO

## 2017-09-24 DIAGNOSIS — Z7901 Long term (current) use of anticoagulants: Secondary | ICD-10-CM

## 2017-09-24 LAB — POCT INR: INR: 2.2 (ref 2.0–3.0)

## 2017-09-24 NOTE — Progress Notes (Signed)
Noted  Shawn R Lowne Chase, DO  

## 2017-09-24 NOTE — Progress Notes (Signed)
Pre visit review using our clinic tool,if applicable. No additional management support is needed unless otherwise documented below in the visit note.   Pt here for INR check per order from [facility].  Goal INR = 2.0-3.0  Last INR = 2.5  Pt currently takes Coumadin   Pt denies recent antibiotics, no dietary changes and no unusual bruising / bleeding.  INR today = 2.2  Pt advised per Dr. Carollee Herter continue medications as ordered. Return an approximatly  1 month for re-check of INR. Appointment scheduled for September 25 ,2019 @ 10:45 .

## 2017-09-24 NOTE — Patient Instructions (Signed)
Pt advised per Dr. Carollee Herter continue medications as ordered. Return an approximatly  1 month for re-check of INR. Appointment scheduled for September 25 ,2019 @ 10:45 .

## 2017-10-30 ENCOUNTER — Ambulatory Visit (INDEPENDENT_AMBULATORY_CARE_PROVIDER_SITE_OTHER): Payer: Medicare HMO

## 2017-10-30 DIAGNOSIS — Z7901 Long term (current) use of anticoagulants: Secondary | ICD-10-CM | POA: Diagnosis not present

## 2017-10-30 LAB — POCT INR: INR: 2.6 (ref 2.0–3.0)

## 2017-10-30 NOTE — Progress Notes (Addendum)
Pre visit review using our clinic tool,if applicable. No additional management support is needed unless otherwise documented below in the visit note.   Pt here for INR check per  Goal INR = 2.0-3.0  Last INR = 2.2  Pt currently takes Coumadin 5mg   Sun,Tue,Thur,Fri,Sat and 7.5 mg on Mon and Wed.  Pt denies recent antibiotics, no dietary changes and no unusual bruising / bleeding.  INR today = 2.6  Patient advised per Dr. Larose Kells to continue medications as ordered and return in 1 mont for INR re-check. Appointment scheduled.  Kathlene November, MD

## 2017-10-30 NOTE — Patient Instructions (Signed)
Per Dr. Larose Kells continue medications as ordered and return in 1 month for INR recheck.

## 2017-11-03 ENCOUNTER — Other Ambulatory Visit: Payer: Self-pay | Admitting: Family Medicine

## 2017-11-03 DIAGNOSIS — I2699 Other pulmonary embolism without acute cor pulmonale: Secondary | ICD-10-CM

## 2017-11-13 ENCOUNTER — Ambulatory Visit (INDEPENDENT_AMBULATORY_CARE_PROVIDER_SITE_OTHER): Payer: Medicare HMO

## 2017-11-13 DIAGNOSIS — Z23 Encounter for immunization: Secondary | ICD-10-CM | POA: Diagnosis not present

## 2017-12-04 ENCOUNTER — Ambulatory Visit (INDEPENDENT_AMBULATORY_CARE_PROVIDER_SITE_OTHER): Payer: Medicare HMO

## 2017-12-04 DIAGNOSIS — Z7901 Long term (current) use of anticoagulants: Secondary | ICD-10-CM | POA: Diagnosis not present

## 2017-12-04 LAB — POCT INR: INR: 2.4 (ref 2.0–3.0)

## 2017-12-04 NOTE — Progress Notes (Addendum)
Pre visit review using our clinic tool,if applicable. No additional management support is needed unless otherwise documented below in the visit note.   Pt here for INR check per order from Dr. Carollee Herter.  Goal INR = 2.0-3.0  Last INR = 2.6  Pt currently takes Coumadin 5 mg 5 days per week and 7.5 mg 2 days per week  Pt denies recent antibiotics, no dietary changes and no unusual bruising / bleeding.  INR today = 2.4  Pt advised per Dr. Larose Kells DOD, continue Coumadin as ordered. Return for INR check in 1 month. Will have INR checked during physical in November.   Kathlene November, MD

## 2017-12-04 NOTE — Patient Instructions (Signed)
Continue taking Coumadin 5 mg 5 days per week and 7.5 mg 2 days per week. Return for INR check in 1 month.

## 2017-12-27 ENCOUNTER — Ambulatory Visit (INDEPENDENT_AMBULATORY_CARE_PROVIDER_SITE_OTHER): Payer: Medicare HMO | Admitting: Family Medicine

## 2017-12-27 ENCOUNTER — Encounter: Payer: Self-pay | Admitting: Family Medicine

## 2017-12-27 VITALS — BP 139/72 | HR 63 | Temp 97.7°F | Resp 16 | Ht 68.0 in | Wt 182.6 lb

## 2017-12-27 DIAGNOSIS — Z7901 Long term (current) use of anticoagulants: Secondary | ICD-10-CM | POA: Diagnosis not present

## 2017-12-27 DIAGNOSIS — I2782 Chronic pulmonary embolism: Secondary | ICD-10-CM

## 2017-12-27 DIAGNOSIS — Z0000 Encounter for general adult medical examination without abnormal findings: Secondary | ICD-10-CM | POA: Diagnosis not present

## 2017-12-27 DIAGNOSIS — Z86718 Personal history of other venous thrombosis and embolism: Secondary | ICD-10-CM | POA: Diagnosis not present

## 2017-12-27 DIAGNOSIS — I359 Nonrheumatic aortic valve disorder, unspecified: Secondary | ICD-10-CM | POA: Diagnosis not present

## 2017-12-27 DIAGNOSIS — E785 Hyperlipidemia, unspecified: Secondary | ICD-10-CM

## 2017-12-27 LAB — CBC WITH DIFFERENTIAL/PLATELET
BASOS ABS: 0 10*3/uL (ref 0.0–0.1)
Basophils Relative: 0.6 % (ref 0.0–3.0)
Eosinophils Absolute: 0.1 10*3/uL (ref 0.0–0.7)
Eosinophils Relative: 2.6 % (ref 0.0–5.0)
HEMATOCRIT: 48.1 % (ref 39.0–52.0)
Hemoglobin: 16.9 g/dL (ref 13.0–17.0)
LYMPHS PCT: 38.3 % (ref 12.0–46.0)
Lymphs Abs: 1.4 10*3/uL (ref 0.7–4.0)
MCHC: 35.2 g/dL (ref 30.0–36.0)
MCV: 96.1 fl (ref 78.0–100.0)
MONOS PCT: 10.1 % (ref 3.0–12.0)
Monocytes Absolute: 0.4 10*3/uL (ref 0.1–1.0)
NEUTROS ABS: 1.8 10*3/uL (ref 1.4–7.7)
Neutrophils Relative %: 48.4 % (ref 43.0–77.0)
Platelets: 136 10*3/uL — ABNORMAL LOW (ref 150.0–400.0)
RBC: 5.01 Mil/uL (ref 4.22–5.81)
RDW: 13.7 % (ref 11.5–15.5)
WBC: 3.7 10*3/uL — AB (ref 4.0–10.5)

## 2017-12-27 LAB — COMPREHENSIVE METABOLIC PANEL
ALT: 23 U/L (ref 0–53)
AST: 25 U/L (ref 0–37)
Albumin: 4.4 g/dL (ref 3.5–5.2)
Alkaline Phosphatase: 49 U/L (ref 39–117)
BILIRUBIN TOTAL: 1.4 mg/dL — AB (ref 0.2–1.2)
BUN: 14 mg/dL (ref 6–23)
CALCIUM: 9.3 mg/dL (ref 8.4–10.5)
CO2: 34 mEq/L — ABNORMAL HIGH (ref 19–32)
CREATININE: 0.97 mg/dL (ref 0.40–1.50)
Chloride: 102 mEq/L (ref 96–112)
GFR: 78.96 mL/min (ref >60.00)
Glucose, Bld: 98 mg/dL (ref 70–99)
Potassium: 3.8 mEq/L (ref 3.5–5.1)
Sodium: 141 mEq/L (ref 135–145)
Total Protein: 7 g/dL (ref 6.0–8.3)

## 2017-12-27 LAB — LIPID PANEL
CHOL/HDL RATIO: 6
Cholesterol: 208 mg/dL — ABNORMAL HIGH (ref 0–200)
HDL: 35.6 mg/dL — ABNORMAL LOW (ref >39.00)
LDL Cholesterol: 134 mg/dL — ABNORMAL HIGH (ref 0–99)
NONHDL: 172.51
TRIGLYCERIDES: 195 mg/dL — AB (ref 0.0–149.0)
VLDL: 39 mg/dL (ref 0.0–40.0)

## 2017-12-27 LAB — PSA: PSA: 2.17 ng/mL (ref 0.10–4.00)

## 2017-12-27 LAB — POCT INR: INR: 3.1 — AB (ref 2.0–3.0)

## 2017-12-27 MED ORDER — SAW PALMETTO 450-15 MG PO CAPS: 1.0000 | ORAL_CAPSULE | Freq: Every day | ORAL | Status: DC

## 2017-12-27 NOTE — Assessment & Plan Note (Signed)
con't coumadin at current dose Recheck 2 weeks

## 2017-12-27 NOTE — Patient Instructions (Signed)
Preventive Care 81 Years and Older, Male Preventive care refers to lifestyle choices and visits with your health care provider that can promote health and wellness. What does preventive care include?  A yearly physical exam. This is also called an annual well check.  Dental exams once or twice a year.  Routine eye exams. Ask your health care provider how often you should have your eyes checked.  Personal lifestyle choices, including: ? Daily care of your teeth and gums. ? Regular physical activity. ? Eating a healthy diet. ? Avoiding tobacco and drug use. ? Limiting alcohol use. ? Practicing safe sex. ? Taking low doses of aspirin every day. ? Taking vitamin and mineral supplements as recommended by your health care provider. What happens during an annual well check? The services and screenings done by your health care provider during your annual well check will depend on your age, overall health, lifestyle risk factors, and family history of disease. Counseling Your health care provider may ask you questions about your:  Alcohol use.  Tobacco use.  Drug use.  Emotional well-being.  Home and relationship well-being.  Sexual activity.  Eating habits.  History of falls.  Memory and ability to understand (cognition).  Work and work environment.  Screening You may have the following tests or measurements:  Height, weight, and BMI.  Blood pressure.  Lipid and cholesterol levels. These may be checked every 5 years, or more frequently if you are over 50 years old.  Skin check.  Lung cancer screening. You may have this screening every year starting at age 55 if you have a 30-pack-year history of smoking and currently smoke or have quit within the past 15 years.  Fecal occult blood test (FOBT) of the stool. You may have this test every year starting at age 50.  Flexible sigmoidoscopy or colonoscopy. You may have a sigmoidoscopy every 5 years or a colonoscopy every 10  years starting at age 50.  Prostate cancer screening. Recommendations will vary depending on your family history and other risks.  Hepatitis C blood test.  Hepatitis B blood test.  Sexually transmitted disease (STD) testing.  Diabetes screening. This is done by checking your blood sugar (glucose) after you have not eaten for a while (fasting). You may have this done every 1-3 years.  Abdominal aortic aneurysm (AAA) screening. You may need this if you are a current or former smoker.  Osteoporosis. You may be screened starting at age 70 if you are at high risk.  Talk with your health care provider about your test results, treatment options, and if necessary, the need for more tests. Vaccines Your health care provider may recommend certain vaccines, such as:  Influenza vaccine. This is recommended every year.  Tetanus, diphtheria, and acellular pertussis (Tdap, Td) vaccine. You may need a Td booster every 10 years.  Varicella vaccine. You may need this if you have not been vaccinated.  Zoster vaccine. You may need this after age 60.  Measles, mumps, and rubella (MMR) vaccine. You may need at least one dose of MMR if you were born in 1957 or later. You may also need a second dose.  Pneumococcal 13-valent conjugate (PCV13) vaccine. One dose is recommended after age 65.  Pneumococcal polysaccharide (PPSV23) vaccine. One dose is recommended after age 65.  Meningococcal vaccine. You may need this if you have certain conditions.  Hepatitis A vaccine. You may need this if you have certain conditions or if you travel or work in places where you   may be exposed to hepatitis A.  Hepatitis B vaccine. You may need this if you have certain conditions or if you travel or work in places where you may be exposed to hepatitis B.  Haemophilus influenzae type b (Hib) vaccine. You may need this if you have certain risk factors.  Talk to your health care provider about which screenings and vaccines  you need and how often you need them. This information is not intended to replace advice given to you by your health care provider. Make sure you discuss any questions you have with your health care provider. Document Released: 02/18/2015 Document Revised: 10/12/2015 Document Reviewed: 11/23/2014 Elsevier Interactive Patient Education  2018 [employer] Inc.  

## 2017-12-27 NOTE — Assessment & Plan Note (Signed)
ghm utd Check labs See AVS 

## 2017-12-27 NOTE — Progress Notes (Signed)
Patient ID: Shawn [name], male    DOB: [birth year]  Age: 83 y.o. MRN: [id number]    Subjective:  Subjective  [name] presents for cpe and labs   No complaints.     Review of Systems  Constitutional: Negative.  Negative for chills and fever.  HENT: Negative for congestion, ear pain, hearing loss, nosebleeds, postnasal drip, rhinorrhea, sinus pressure, sneezing and tinnitus.   Eyes: Negative for photophobia, discharge, itching and visual disturbance.  Respiratory: Negative.  Negative for cough and shortness of breath.   Cardiovascular: Negative.  Negative for chest pain, palpitations and leg swelling.  Gastrointestinal: Negative for abdominal distention, abdominal pain, anal bleeding, blood in stool, constipation, diarrhea, nausea and vomiting.  Endocrine: Negative.   Genitourinary: Negative.  Negative for dysuria, frequency, hematuria and urgency.  Musculoskeletal: Negative.  Negative for back pain and myalgias.  Skin: Negative.  Negative for rash.  Allergic/Immunologic: Negative.  Negative for environmental allergies.  Neurological: Negative for dizziness, weakness, light-headedness, numbness and headaches.  Hematological: Does not bruise/bleed easily.  Psychiatric/Behavioral: Negative for agitation, confusion, decreased concentration, dysphoric mood, sleep disturbance and suicidal ideas. The patient is not nervous/anxious.     History Past Medical History:  Diagnosis Date  . Allergy   . Cardiac murmur    mild aortic sclerosis  . Cataract    left eye  . Clotting disorder (HCC)    Factor v Leiden factor mutation  . Colon polyps    Hyperplastic  . Diverticulosis   . Dysphagia   . Factor V Leiden mutation ([place])   . Hemorrhoids   . History of blood clots   . Hyperlipidemia   . Osteoarthritis     He has a past surgical history that includes Appendectomy; Vasectomy; Tonsillectomy; Inguinal hernia repair; Cataract extraction (02/09/2007); and  Varicose vein surgery.   His family history includes Arthritis in his mother; Heart attack (age of onset: 63) in his mother; Heart disease in his mother; Lung cancer in his father.He reports that he has never smoked. He has never used smokeless tobacco. He reports that he does not drink alcohol or use drugs.  Current Outpatient Medications on File Prior to Visit  Medication Sig Dispense Refill  . Cholecalciferol ([employer]S VITAMIN D3) 1000 UNITS capsule Take 2,000 Units by mouth daily.     . Cyanocobalamin (VITAMIN B-12) 5000 MCG TBDP Take 1 tablet by mouth daily.    . folic acid (FOLVITE) 287 MCG tablet Take 400 mcg by mouth daily.    Marland Kitchen Hyaluronic Acid-Vitamin C (HYALURONIC ACID PO) Take 2 tablets by mouth daily. (200 mg)    . Krill Oil 1000 MG CAPS Take 1 capsule by mouth daily.    . Multiple Vitamin (MULTIVITAMIN) tablet Take 1 tablet by mouth daily.      . Omega-3 Fatty Acids (FISH OIL PO) Take 2,500 mg by mouth daily.    Marland Kitchen warfarin (COUMADIN) 5 MG tablet TAKE 1 TABLET BY MOUTH EVERY DAY AS DIRECTED 90 tablet 1   No current facility-administered medications on file prior to visit.      Objective:  Objective  Physical Exam  Constitutional: He is oriented to person, place, and time. He appears well-developed and well-nourished. No distress.  HENT:  Head: Normocephalic and atraumatic.  Right Ear: External ear normal.  Left Ear: External ear normal.  Nose: Nose normal.  Mouth/Throat: Oropharynx is clear and moist. No oropharyngeal exudate.  Eyes: Pupils are equal, round, and reactive to light. Conjunctivae and  EOM are normal. Right eye exhibits no discharge. Left eye exhibits no discharge.  Neck: Normal range of motion. Neck supple. No JVD present. No thyromegaly present.  Cardiovascular: Normal rate, regular rhythm and intact distal pulses.  No murmur heard. Pulmonary/Chest: Effort normal and breath sounds normal. No respiratory distress. He has no wheezes. He has no rales. He exhibits no  tenderness.  Abdominal: Soft. Bowel sounds are normal. He exhibits no distension and no mass. There is no tenderness. There is no rebound and no guarding.  Genitourinary:  Genitourinary Comments: Pt refused rectal / hernia check  Musculoskeletal: Normal range of motion. He exhibits no edema or tenderness.  Lymphadenopathy:    He has no cervical adenopathy.  Neurological: He is alert and oriented to person, place, and time. He has normal reflexes. He displays normal reflexes. No cranial nerve deficit. He exhibits normal muscle tone.  Skin: Skin is warm and dry. No rash noted. He is not diaphoretic. No erythema.  Psychiatric: He has a normal mood and affect. His behavior is normal. Judgment and thought content normal.  Nursing note and vitals reviewed.  BP 139/72   Pulse 63   Temp 97.7 F (36.5 C) (Oral)   Resp 16   Ht 5\' 8"  (1.727 m)   Wt 182 lb 9.6 oz (82.8 kg)   SpO2 98%   BMI 27.76 kg/m  Wt Readings from Last 3 Encounters:  12/27/17 182 lb 9.6 oz (82.8 kg)  08/27/17 178 lb 9.6 oz (81 kg)  12/24/16 177 lb (80.3 kg)     Lab Results  Component Value Date   WBC 3.9 (L) 12/24/2016   HGB 17.2 (H) 12/24/2016   HCT 50.3 12/24/2016   PLT 139.0 (L) 12/24/2016   GLUCOSE 86 12/24/2016   CHOL 181 12/24/2016   TRIG 181.0 (H) 12/24/2016   HDL 35.90 (L) 12/24/2016   LDLDIRECT 86.0 06/17/2014   LDLCALC 109 (H) 12/24/2016   ALT 13 12/24/2016   AST 20 12/24/2016   NA 140 12/24/2016   K 3.9 12/24/2016   CL 101 12/24/2016   CREATININE 0.87 12/24/2016   BUN 12 12/24/2016   CO2 34 (H) 12/24/2016   TSH 1.66 08/11/2007   PSA 2.51 12/24/2016   INR 3.1 (A) 12/27/2017   MICROALBUR 0.7 11/15/2014    US Venous Img Lower Unilateral Left  Result Date: 08/29/2017 CLINICAL DATA:  Left lower extremity pain and edema for the past week. History of pulmonary embolism. Evaluate for DVT. EXAM: LEFT LOWER EXTREMITY VENOUS DOPPLER ULTRASOUND TECHNIQUE: Gray-scale sonography with graded compression,  as well as color Doppler and duplex ultrasound were performed to evaluate the lower extremity deep venous systems from the level of the common femoral vein and including the common femoral, femoral, profunda femoral, popliteal and calf veins including the posterior tibial, peroneal and gastrocnemius veins when visible. The superficial great saphenous vein was also interrogated. Spectral Doppler was utilized to evaluate flow at rest and with distal augmentation maneuvers in the common femoral, femoral and popliteal veins. COMPARISON:  None. FINDINGS: Contralateral Common Femoral Vein: Respiratory phasicity is normal and symmetric with the symptomatic side. No evidence of thrombus. Normal compressibility. Common Femoral Vein: No evidence of thrombus. Normal compressibility, respiratory phasicity and response to augmentation. Saphenofemoral Junction: No evidence of thrombus. Normal compressibility and flow on color Doppler imaging. Profunda Femoral Vein: No evidence of thrombus. Normal compressibility and flow on color Doppler imaging. Femoral Vein: No evidence of thrombus. Normal compressibility, respiratory phasicity and response to augmentation. Popliteal  Vein: No evidence of thrombus. Normal compressibility, respiratory phasicity and response to augmentation. Calf Veins: No evidence of thrombus. Normal compressibility and flow on color Doppler imaging. Superficial Great Saphenous Vein: No evidence of thrombus. Normal compressibility. Venous Reflux:  None. Other Findings:  None. IMPRESSION: No evidence of DVT within the left lower extremity. Electronically Signed   By: Sandi Mariscal M.D.   On: 08/29/2017 10:37     Assessment & Plan:  Plan  I have discontinued Lysle [employer]'s saw palmetto, niacin, and Fish Oil. I am also having him start on [facility]. Additionally, I am having him maintain his multivitamin, Cholecalciferol, Hyaluronic Acid-Vitamin C (HYALURONIC ACID PO), Vitamin N-02, folic acid,  warfarin, Omega-3 Fatty Acids (FISH OIL PO), and Krill Oil.  Meds ordered this encounter  Medications  . Saw Palmetto 450-15 MG CAPS    Sig: Take 1 capsule by mouth daily.    Problem List Items Addressed This Visit      Unprioritized   Preventative health care    ghm utd Check labs See AVS      Pulmonary embolism ([place])    con't coumadin at current dose Recheck 2 weeks       PULMONARY EMBOLISM, HX OF    On coumadin stable      Relevant Orders   PSA   Lipid panel   CBC with Differential/Platelet   Comprehensive metabolic panel    Other Visit Diagnoses    Hyperlipidemia LDL goal <100    -  Primary   Relevant Orders   PSA   Lipid panel   CBC with Differential/Platelet   Comprehensive metabolic panel   Aortic valve disease       Relevant Orders   PSA   Lipid panel   CBC with Differential/Platelet   Comprehensive metabolic panel   Encounter for current long-term use of anticoagulants       Relevant Orders   POCT INR (Completed)      Follow-up: Return in about 1 year (around 12/28/2018), or if symptoms worsen or fail to improve, for annual exam, fasting.  Ann Held, DO

## 2017-12-27 NOTE — Assessment & Plan Note (Signed)
On coumadin stable

## 2018-01-07 ENCOUNTER — Encounter: Payer: Self-pay | Admitting: Family Medicine

## 2018-01-08 ENCOUNTER — Telehealth: Payer: Self-pay | Admitting: *Deleted

## 2018-01-08 ENCOUNTER — Other Ambulatory Visit (INDEPENDENT_AMBULATORY_CARE_PROVIDER_SITE_OTHER): Payer: Medicare HMO

## 2018-01-08 DIAGNOSIS — Z1211 Encounter for screening for malignant neoplasm of colon: Secondary | ICD-10-CM

## 2018-01-08 NOTE — Telephone Encounter (Signed)
Received call from [facility] lab stating pt has returned an IFOB kit. The lab needs future order placed so they can result it in Epic.  Please place the order.

## 2018-01-09 ENCOUNTER — Other Ambulatory Visit: Payer: Self-pay | Admitting: *Deleted

## 2018-01-09 DIAGNOSIS — Z1211 Encounter for screening for malignant neoplasm of colon: Secondary | ICD-10-CM

## 2018-01-09 LAB — FECAL OCCULT BLOOD, IMMUNOCHEMICAL: Fecal Occult Bld: NEGATIVE

## 2018-01-09 NOTE — Telephone Encounter (Signed)
Future order placed 

## 2018-01-09 NOTE — Telephone Encounter (Signed)
Thank you :)

## 2018-01-22 ENCOUNTER — Ambulatory Visit (INDEPENDENT_AMBULATORY_CARE_PROVIDER_SITE_OTHER): Payer: Medicare HMO

## 2018-01-22 DIAGNOSIS — Z7901 Long term (current) use of anticoagulants: Secondary | ICD-10-CM | POA: Diagnosis not present

## 2018-01-22 LAB — POCT INR: INR: 2.4 (ref 2.0–3.0)

## 2018-01-22 NOTE — Progress Notes (Signed)
Noted. Agree with above.  [place], DO 01/22/18 1:32 PM

## 2018-01-22 NOTE — Progress Notes (Signed)
Pre visit review using our clinic tool,if applicable. No additional management support is needed unless otherwise documented below in the visit note.   Pt here for INR check per order from Dr. Carollee Herter.  Goal INR = 2.0-3.0  Last INR = 2.4  Pt currently takes Coumadin 5 mg 5 days per week And 7.5 mg 2 days per week.  Pt denies recent antibiotics, no dietary changes and no unusual bruising / bleeding.  INR today = 2.4  Pt advised per Dr. Nani Ravens, DOD patient to continue medications as ordered and return for INR re-check in 4 weeks.

## 2018-01-22 NOTE — Patient Instructions (Signed)
Continue medications as ordered  5 mg 5 days per week and 7.5 mg 2 days per weeks.and return in 4 weeks for INR re-check.

## 2018-02-18 ENCOUNTER — Ambulatory Visit (INDEPENDENT_AMBULATORY_CARE_PROVIDER_SITE_OTHER): Payer: Medicare HMO

## 2018-02-18 DIAGNOSIS — Z7901 Long term (current) use of anticoagulants: Secondary | ICD-10-CM | POA: Diagnosis not present

## 2018-02-18 LAB — POCT INR: INR: 2.1 (ref 2.0–3.0)

## 2018-02-18 NOTE — Progress Notes (Signed)
Pre visit review using our clinic tool,if applicable. No additional management support is needed unless otherwise documented below in the visit note.   Pt here for INR check per order from Dr. Kari Baars.  Goal INR = 2.0-3.0  Last INR = 2.4  Pt currently takes Coumadin 5 mg 5 days per week then Coumadin 7.55 mg  2 per week.   Pt denies recent antibiotics, no dietary changes and no unusual bruising / bleeding.  INR today = 2.1  Pt advised per Dr. Carollee Herter patient to take Coumadin 7. 5 3 days per week and 5 mg 4 days per week.  Return for INR check in 1 week.

## 2018-02-18 NOTE — Patient Instructions (Signed)
Coumadin 7.5 mg 3 days per week and 5 mg 4 days per week. Return for INR check in 1 week.

## 2018-02-18 NOTE — Progress Notes (Signed)
Reviewed  Yvonne R Lowne Chase, DO  

## 2018-02-19 ENCOUNTER — Ambulatory Visit: Payer: Medicare HMO

## 2018-02-25 ENCOUNTER — Ambulatory Visit (INDEPENDENT_AMBULATORY_CARE_PROVIDER_SITE_OTHER): Payer: Medicare HMO

## 2018-02-25 DIAGNOSIS — Z7901 Long term (current) use of anticoagulants: Secondary | ICD-10-CM

## 2018-02-25 LAB — POCT INR: INR: 3 (ref 2.0–3.0)

## 2018-02-25 NOTE — Progress Notes (Signed)
Patient in today per Dr. Etter Sjogren for INR check Patient Currently takes Coumadin 5mg  3 days 7.5mg  4 days 5mg   Last dose: 5mg  last night    Goal INR = 2.0-3.0  Last INR =2.1  INR today = 3.0  Patient denies recent antibiotics, no dietary changes and no unusual bleeding / bruising.   Per Dr. Etter Sjogren patient is to continue 7.5mg  3 days a week and 5mg  4 days a week for one month, and come back in one month for nurse visit. Patient schedule NV for 04/01/18 @ 10:45am.

## 2018-03-31 ENCOUNTER — Other Ambulatory Visit: Payer: Self-pay | Admitting: Emergency Medicine

## 2018-03-31 DIAGNOSIS — Z7901 Long term (current) use of anticoagulants: Secondary | ICD-10-CM

## 2018-04-01 ENCOUNTER — Ambulatory Visit (INDEPENDENT_AMBULATORY_CARE_PROVIDER_SITE_OTHER): Payer: Medicare HMO | Admitting: *Deleted

## 2018-04-01 ENCOUNTER — Other Ambulatory Visit: Payer: Medicare HMO

## 2018-04-01 DIAGNOSIS — Z7901 Long term (current) use of anticoagulants: Secondary | ICD-10-CM | POA: Diagnosis not present

## 2018-04-01 LAB — POCT INR: INR: 2.6 (ref 2.0–3.0)

## 2018-04-01 NOTE — Progress Notes (Signed)
Patient in today per Dr. Etter Sjogren for INR check Patient Currently takes Coumadin 5mg  3 days 7.5mg  4 days 5mg   Last dose: 5mg  last night   Missed a dose on Saturday  Goal INR = 2.0-3.0  Last INR =3.0  INR today = 2.6  Patient denies recent antibiotics, no dietary changes and no unusual bleeding / bruising.   Per Dr. Etter Sjogren continue same dose and recheck in one month.  Appointment scheduled for 05/02/18.

## 2018-04-01 NOTE — Addendum Note (Signed)
Addended by: Caffie Pinto on: 04/01/2018 10:55 AM   Modules accepted: Orders

## 2018-04-07 ENCOUNTER — Other Ambulatory Visit: Payer: Self-pay | Admitting: *Deleted

## 2018-04-07 DIAGNOSIS — I2699 Other pulmonary embolism without acute cor pulmonale: Secondary | ICD-10-CM

## 2018-04-07 MED ORDER — WARFARIN SODIUM 5 MG PO TABS: 5.0000 mg | ORAL_TABLET | Freq: Every day | ORAL | 1 refills | Status: DC

## 2018-04-25 ENCOUNTER — Ambulatory Visit: Payer: Self-pay | Admitting: *Deleted

## 2018-04-25 NOTE — Telephone Encounter (Signed)
Lets postpone 2 weeks to start ---- we may have some things worked out by then to lessen his risk

## 2018-04-25 NOTE — Telephone Encounter (Signed)
Please advise 

## 2018-04-25 NOTE — Telephone Encounter (Signed)
Patient would like to cancel his INR appt on 05/02/2018 but he wants to know how long he can wait to make another appt.

## 2018-04-28 ENCOUNTER — Telehealth: Payer: Self-pay | Admitting: *Deleted

## 2018-04-28 NOTE — Telephone Encounter (Signed)
Copied from [place] [id number]. Topic: General - Inquiry >> Apr 28, 2018  8:49 AM Ahmed Prima L wrote: Reason for CRM: patient's wife would like to know should he still have his INR checked this Friday? Can he wait another month? Please Advise

## 2018-04-29 NOTE — Telephone Encounter (Signed)
Appointment changed to 05/15/18.

## 2018-05-02 ENCOUNTER — Ambulatory Visit: Payer: Medicare HMO

## 2018-05-15 ENCOUNTER — Ambulatory Visit: Payer: Medicare HMO

## 2018-05-15 ENCOUNTER — Other Ambulatory Visit: Payer: Self-pay

## 2018-05-15 DIAGNOSIS — Z7901 Long term (current) use of anticoagulants: Secondary | ICD-10-CM

## 2018-05-15 MED ORDER — WARFARIN SODIUM 5 MG PO TABS: ORAL_TABLET | ORAL | 0 refills | Status: DC

## 2018-05-15 MED ORDER — COUMADIN 7.5 MG PO TABS: ORAL_TABLET | ORAL | 0 refills | Status: DC

## 2018-05-15 NOTE — Progress Notes (Signed)
Pre visit review using our clinic tool,if applicable. No additional management support is needed unless otherwise documented below in the visit note.   Pt here for INR check per order from Dr. Carollee Herter  Goal INR = 2.0-3.0  Last INR = 2.6  Pt currently takes Coumadin 5 mg  3 days per week and 7.5 mg 4 days per week.  Pt denies recent antibiotics, no dietary changes and no unusual bruising / bleeding.  INR today = 2.1   Pt advised per Dr. Nani Ravens patient to continue taking Coumadin as ordered. Patient request to extend his return date to 1 1/2 month because of the Corona Virus. Advised by Dr. Nani Ravens to check with Dr. Carollee Herter. Will call patient to schedule net appointment.

## 2018-05-22 ENCOUNTER — Telehealth: Payer: Self-pay | Admitting: *Deleted

## 2018-05-22 MED ORDER — WARFARIN SODIUM 5 MG PO TABS: ORAL_TABLET | ORAL | 0 refills | Status: DC

## 2018-05-22 MED ORDER — COUMADIN 7.5 MG PO TABS: ORAL_TABLET | ORAL | 0 refills | Status: DC

## 2018-05-22 NOTE — Telephone Encounter (Signed)
Spoke with spouse and scheduled nurse visit INR check for 06/26/18 at 10am.

## 2018-05-22 NOTE — Telephone Encounter (Signed)
Copied from [place] [id number]. Topic: General - Other >> May 22, 2018 10:33 AM Leward Quan A wrote: Reason for CRM: Patient wife called to get clarification on the Rx sent to the pharmacy for her husband COUMADIN 7.5 MG tablet stated that he was taking COUMADIN 5 MG tablet three times a week now this is a higher dose for four times a week. Asking for clarification please. She is also asking to stretch out his appointment a little further. Please advise Ph# [phone]

## 2018-05-22 NOTE — Telephone Encounter (Signed)
That is fine 

## 2018-05-22 NOTE — Telephone Encounter (Signed)
Spoke with pt's spouse. She states pt was previously taking Coumadin 7.5mg  x 3 days a week and 5mg  x 4 days a week. When RXS were sent in recently, directions were reversed. I reviewed nurse visit from January and February and both notes confirm directions as pt reported above and no changes were made to dose at either visit. At 4/9 nurse visit it was noted that pt was taking 7.5mg  x 4 days/wk and 5mg  x 3 days/wk (pt states this was incorrect). INR was within normal limit at visit in April and no changes were made to his dose. Advised spouse to continue taking Coumadin as previous directions of 7.5mg  x 3 day/wk and 5mg  x 4 day/wk. Med list has been corrected. Pt is asking to recheck Coumadin at 6 week interval instead of 4 week interval. Next check will need to be scheduled. Is 6 week ok from last check on 4/9?

## 2018-06-26 ENCOUNTER — Ambulatory Visit (INDEPENDENT_AMBULATORY_CARE_PROVIDER_SITE_OTHER): Payer: Medicare HMO | Admitting: *Deleted

## 2018-06-26 ENCOUNTER — Other Ambulatory Visit: Payer: Self-pay

## 2018-06-26 DIAGNOSIS — Z7901 Long term (current) use of anticoagulants: Secondary | ICD-10-CM | POA: Diagnosis not present

## 2018-06-26 LAB — POCT INR: INR: 2.1 (ref 2.0–3.0)

## 2018-06-26 NOTE — Progress Notes (Addendum)
Pt here for INR check per order from Dr. Carollee Herter  Goal INR = 2.0-3.0  Last INR = 2.1  Pt currently takes Coumadin 5 mg  4 days per week and 7.5 mg 3 days per week.  Pt denies recent antibiotics, no dietary changes and no unusual bruising / bleeding.  No missed doses  INR today =  2.1  Per Percell Miller patient to continue and recheck in one month.  Patient scheduled.  No change in regimen. Follow up in one month   Mackie Pai, PA-C

## 2018-07-11 ENCOUNTER — Telehealth: Payer: Self-pay

## 2018-07-11 NOTE — Telephone Encounter (Signed)
Copied from [place] [id number]. Topic: Quick Communication - Rx Refill/Question >> Jul 11, 2018  1:37 PM Erick Blinks wrote: Medication: [facility] -pt's wife Remo Lipps is requesting call back to discuss, and its possible with other medications.

## 2018-07-15 NOTE — Telephone Encounter (Signed)
Pts wife called and wanted to inquire about her request to see if the Pt can take Prevagen while he is taking Warfarin / Please advise

## 2018-07-15 NOTE — Telephone Encounter (Signed)
Please advise 

## 2018-07-15 NOTE — Telephone Encounter (Signed)
There are no known interactions

## 2018-07-16 NOTE — Telephone Encounter (Signed)
Patient wife notified.

## 2018-08-19 ENCOUNTER — Other Ambulatory Visit: Payer: Self-pay

## 2018-08-19 ENCOUNTER — Ambulatory Visit (INDEPENDENT_AMBULATORY_CARE_PROVIDER_SITE_OTHER): Payer: Medicare HMO

## 2018-08-19 DIAGNOSIS — Z7901 Long term (current) use of anticoagulants: Secondary | ICD-10-CM

## 2018-08-19 LAB — POCT INR: INR: 2.4 (ref 2.0–3.0)

## 2018-08-19 NOTE — Progress Notes (Signed)
Pt here for INR check per  Goal INR =2.0 to 3.0  Last INR =2.1  Pt currently takes Coumadin 5 mg 4 days a week and 7.5 mg 3 days a week.    Pt denies recent antibiotics, no dietary changes and no unusual bruising / bleeding.  INR today = 2.4  Pt advised per Dr. Etter Sjogren ok to continue same dose and return in 6 weeks at patinet's request.

## 2018-08-25 ENCOUNTER — Other Ambulatory Visit: Payer: Self-pay | Admitting: Family Medicine

## 2018-09-30 ENCOUNTER — Ambulatory Visit (INDEPENDENT_AMBULATORY_CARE_PROVIDER_SITE_OTHER): Payer: Medicare HMO | Admitting: *Deleted

## 2018-09-30 ENCOUNTER — Other Ambulatory Visit: Payer: Self-pay

## 2018-09-30 DIAGNOSIS — Z7901 Long term (current) use of anticoagulants: Secondary | ICD-10-CM

## 2018-09-30 LAB — POCT INR: INR: 2.4 (ref 2.0–3.0)

## 2018-09-30 MED ORDER — WARFARIN SODIUM 7.5 MG PO TABS: ORAL_TABLET | ORAL | 0 refills | Status: DC

## 2018-09-30 NOTE — Progress Notes (Signed)
Pt here for INR check per Dr. Etter Sjogren  Goal INR =2.0 to 3.0  Last INR =2.4  Pt currently takes Coumadin 5 mg 4 days a week and 7.5 mg 3 days a week.    Pt denies recent antibiotics, no dietary changes and no unusual bruising / bleeding.  INR today = 2.4  Per Dr. Etter Sjogren ok to continue same dose and return in 6 weeks.  Appointment made for patient for 6 weeks.

## 2018-10-07 ENCOUNTER — Other Ambulatory Visit: Payer: Self-pay | Admitting: Family Medicine

## 2018-10-07 DIAGNOSIS — I2699 Other pulmonary embolism without acute cor pulmonale: Secondary | ICD-10-CM

## 2018-11-13 ENCOUNTER — Other Ambulatory Visit: Payer: Self-pay

## 2018-11-13 ENCOUNTER — Ambulatory Visit (INDEPENDENT_AMBULATORY_CARE_PROVIDER_SITE_OTHER): Payer: Medicare HMO | Admitting: *Deleted

## 2018-11-13 DIAGNOSIS — Z7901 Long term (current) use of anticoagulants: Secondary | ICD-10-CM

## 2018-11-13 DIAGNOSIS — Z23 Encounter for immunization: Secondary | ICD-10-CM

## 2018-11-13 LAB — POCT INR: INR: 2.6 (ref 2.0–3.0)

## 2018-11-13 NOTE — Progress Notes (Signed)
Cardiology Office Note   Date:  11/14/2018   ID:  Shawn [name], DOB 04/19/1934, MRN [id number]  PCP:  Carollee Herter, Alferd Apa, DO  Cardiologist:   Minus Breeding, MD   Chief Complaint  Patient presents with  . Aortic Stenosis     History of Present Illness:  Shawn [name] is a 84 y.o. male will present for office visit. He has past medical history of mild aortic valve sclerosis with cardiac murmur, hyperlipidemia, factor V Leiden mutation and history of DVT/PE on coumadin.   He was seen in July last year for near syncope.  His INR was subtherapeutic.  However, MRI  demonstrated no acute changes.    Since I last saw him he has done well.  He says his memory is not so good so he does not remember the events from a few years ago.The patient denies any new symptoms such as chest discomfort, neck or arm discomfort. There has been no new shortness of breath, PND or orthopnea. There have been no reported palpitations, presyncope or syncope.  He walks on a treadmill and walks with his wife every day.  Past Medical History:  Diagnosis Date  . Allergy   . Cardiac murmur    mild aortic sclerosis  . Cataract    left eye  . Clotting disorder (HCC)    Factor v Leiden factor mutation  . Colon polyps    Hyperplastic  . Diverticulosis   . Dysphagia   . Factor V Leiden mutation ([place])   . Hemorrhoids   . History of blood clots   . Hyperlipidemia   . Osteoarthritis     Past Surgical History:  Procedure Laterality Date  . APPENDECTOMY    . CATARACT EXTRACTION  02/09/2007   right eye  . INGUINAL HERNIA REPAIR    . TONSILLECTOMY    . VARICOSE VEIN SURGERY    . VASECTOMY       Current Outpatient Medications  Medication Sig Dispense Refill  . Cholecalciferol ([employer] VITAMIN D3) 1000 UNITS capsule Take 2,000 Units by mouth daily.     . Cyanocobalamin (VITAMIN B-12) 5000 MCG TBDP Take 1 tablet by mouth daily.    . folic acid (FOLVITE) Q000111Q MCG tablet Take 400 mcg  by mouth daily.    Marland Kitchen Hyaluronic Acid-Vitamin C (HYALURONIC ACID PO) Take 2 tablets by mouth daily. (200 mg)    . Krill Oil 1000 MG CAPS Take 1 capsule by mouth daily.    . Multiple Vitamin (MULTIVITAMIN) tablet Take 1 tablet by mouth daily.      . Omega-3 Fatty Acids (FISH OIL PO) Take 2,500 mg by mouth daily.    . Saw Palmetto 450-15 MG CAPS Take 1 capsule by mouth daily.    Marland Kitchen warfarin (COUMADIN) 5 MG tablet TAKE 1 TABLET BY MOUTH 4 days a week 48 tablet 0  . warfarin (COUMADIN) 7.5 MG tablet 7.5 MG DAILY 3 DAYS PER WEEK. 48 tablet 0   No current facility-administered medications for this visit.     Allergies:   Niacin and Pravastatin    ROS:  Please see the history of present illness.   Otherwise, review of systems are positive for .   All other systems are reviewed and negative.    PHYSICAL EXAM: VS:  BP (!) 166/84   Pulse 72   Ht 5\' 8"  (1.727 m)   Wt 181 lb (82.1 kg)   SpO2 95%   BMI 27.52 kg/m  ,  BMI Body mass index is 27.52 kg/m.  GENERAL:  Well appearing NECK:  No jugular venous distention, waveform within normal limits, carotid upstroke brisk and symmetric, no bruits, no thyromegaly LUNGS:  Clear to auscultation bilaterally CHEST:  Unremarkable HEART:  PMI not displaced or sustained,S1 and S2 within normal limits, no S3, no S4, no clicks, no rubs, 2 out of 6 apical systolic murmur radiating slightly at the aortic outflow tract, no diastolic murmurs ABD:  Flat, positive bowel sounds normal in frequency in pitch, no bruits, no rebound, no guarding, no midline pulsatile mass, no hepatomegaly, no splenomegaly EXT:  2 plus pulses throughout, no edema, no cyanosis no clubbing  EKG:  EKG is  ordered today. The ekg ordered today demonstrates NSR, rate 72, no acute ST T wave changes.     Recent Labs: 12/27/2017: ALT 23; BUN 14; Creatinine, Ser 0.97; Hemoglobin 16.9; Platelets 136.0; Potassium 3.8; Sodium 141    Lipid Panel    Component Value Date/Time   CHOL 208 (H)  12/27/2017 0908   TRIG 195.0 (H) 12/27/2017 0908   HDL 35.60 (L) 12/27/2017 0908   CHOLHDL 6 12/27/2017 0908   VLDL 39.0 12/27/2017 0908   LDLCALC 134 (H) 12/27/2017 0908   LDLDIRECT 86.0 06/17/2014 1008      Wt Readings from Last 3 Encounters:  11/14/18 181 lb (82.1 kg)  12/27/17 182 lb 9.6 oz (82.8 kg)  08/27/17 178 lb 9.6 oz (81 kg)      Other studies Reviewed: Additional studies/ records that were reviewed today include: Labs Review of the above records demonstrates:  NA   ASSESSMENT AND PLAN:  NEAR SYNCOPE:     He has had no further episodes.  No further testing is indicated.  DVT/PE: He tolerates anticoagulation.  No change in therapy.   AS:   His exam would suggest mild aortic stenosis and I do not think further imaging is indicated.   DYSLIPIDEMIA: He has a follow-up primary care appointment in December.  I went through he is lipids with him and asked him to get this checked again as his levels are going up.  We talked about the Mediterranean diet.  I do think he would be excited about any therapies for this.  HTN: His blood pressure was elevated today but he came down to a systolic of Q000111Q after check it again.  We can keep an eye on this at home.  No change in therapy.  Current medicines are reviewed at length with the patient today.  The patient does not have concerns regarding medicines.  The following changes have been made:    None  Labs/ tests ordered today include:    None    Orders Placed This Encounter  Procedures  . EKG 12-Lead     Disposition:   FU with me in 2 years.     Signed, Minus Breeding, MD  11/14/2018 11:17 AM    [place]

## 2018-11-13 NOTE — Progress Notes (Signed)
Pt here for INR check per Dr. Etter Sjogren  Goal INR =2.0 to 3.0  Last INR =2.4  Pt currently takes Coumadin5 mg 4 days a week and 7.5 mg 3 days a week.  Pt denies recent antibiotics, no dietary changes and no unusual bruising / bleeding.  INR today =2.6  Per Dr. Etter Sjogren continue same dose and recheck in about 6 week.  Patient agreed with plan.  His next recheck is due on date of CPE and will get it done at that time.

## 2018-11-14 ENCOUNTER — Encounter: Payer: Self-pay | Admitting: Cardiology

## 2018-11-14 ENCOUNTER — Ambulatory Visit: Payer: Medicare HMO | Admitting: Cardiology

## 2018-11-14 VITALS — BP 166/84 | HR 72 | Ht 68.0 in | Wt 181.0 lb

## 2018-11-14 DIAGNOSIS — R609 Edema, unspecified: Secondary | ICD-10-CM

## 2018-11-14 DIAGNOSIS — I350 Nonrheumatic aortic (valve) stenosis: Secondary | ICD-10-CM

## 2018-11-14 NOTE — Patient Instructions (Signed)
Medication Instructions:  Your physician recommends that you continue on your current medications as directed. Please refer to the Current Medication list given to you today.  If you need a refill on your cardiac medications before your next appointment, please call your pharmacy.   Lab work: NONE  Testing/Procedures: NONE  Follow-Up: At [facility], you and your health needs are our priority.  As part of our continuing mission to provide you with exceptional heart care, we have created designated Provider Care Teams.  These Care Teams include your primary Cardiologist (physician) and Advanced Practice Providers (APPs -  Physician Assistants and Nurse Practitioners) who all work together to provide you with the care you need, when you need it. You will need a follow up appointment in 2 years.  Please call our office 2 months in advance to schedule this appointment.  You may see Dr. Percival Spanish or one of the following Advanced Practice Providers on your designated Care Team:   Rosaria Ferries, PA-C Jory Sims, DNP, ANP

## 2019-01-05 ENCOUNTER — Other Ambulatory Visit: Payer: Self-pay

## 2019-01-06 ENCOUNTER — Ambulatory Visit (INDEPENDENT_AMBULATORY_CARE_PROVIDER_SITE_OTHER): Payer: Medicare HMO | Admitting: Family Medicine

## 2019-01-06 ENCOUNTER — Encounter: Payer: Self-pay | Admitting: Family Medicine

## 2019-01-06 ENCOUNTER — Other Ambulatory Visit: Payer: Self-pay

## 2019-01-06 VITALS — BP 136/70 | HR 66 | Temp 97.2°F | Resp 18 | Ht 68.0 in | Wt 181.0 lb

## 2019-01-06 DIAGNOSIS — Z0000 Encounter for general adult medical examination without abnormal findings: Secondary | ICD-10-CM | POA: Diagnosis not present

## 2019-01-06 DIAGNOSIS — D6851 Activated protein C resistance: Secondary | ICD-10-CM

## 2019-01-06 DIAGNOSIS — I359 Nonrheumatic aortic valve disorder, unspecified: Secondary | ICD-10-CM

## 2019-01-06 DIAGNOSIS — Z87438 Personal history of other diseases of male genital organs: Secondary | ICD-10-CM | POA: Diagnosis not present

## 2019-01-06 DIAGNOSIS — Z86718 Personal history of other venous thrombosis and embolism: Secondary | ICD-10-CM | POA: Diagnosis not present

## 2019-01-06 DIAGNOSIS — E785 Hyperlipidemia, unspecified: Secondary | ICD-10-CM

## 2019-01-06 DIAGNOSIS — D682 Hereditary deficiency of other clotting factors: Secondary | ICD-10-CM

## 2019-01-06 DIAGNOSIS — Z7901 Long term (current) use of anticoagulants: Secondary | ICD-10-CM | POA: Diagnosis not present

## 2019-01-06 LAB — COMPREHENSIVE METABOLIC PANEL
ALT: 16 U/L (ref 0–53)
AST: 22 U/L (ref 0–37)
Albumin: 4.4 g/dL (ref 3.5–5.2)
Alkaline Phosphatase: 47 U/L (ref 39–117)
BUN: 13 mg/dL (ref 6–23)
CO2: 33 mEq/L — ABNORMAL HIGH (ref 19–32)
Calcium: 9.1 mg/dL (ref 8.4–10.5)
Chloride: 100 mEq/L (ref 96–112)
Creatinine, Ser: 0.87 mg/dL (ref 0.40–1.50)
GFR: 84.02 mL/min (ref >60.00)
Glucose, Bld: 99 mg/dL (ref 70–99)
Potassium: 3.8 mEq/L (ref 3.5–5.1)
Sodium: 139 mEq/L (ref 135–145)
Total Bilirubin: 1.5 mg/dL — ABNORMAL HIGH (ref 0.2–1.2)
Total Protein: 6.9 g/dL (ref 6.0–8.3)

## 2019-01-06 LAB — LIPID PANEL
Cholesterol: 212 mg/dL — ABNORMAL HIGH (ref 0–200)
HDL: 36.9 mg/dL — ABNORMAL LOW (ref >39.00)
LDL Cholesterol: 140 mg/dL — ABNORMAL HIGH (ref 0–99)
NonHDL: 175.42
Total CHOL/HDL Ratio: 6
Triglycerides: 177 mg/dL — ABNORMAL HIGH (ref 0.0–149.0)
VLDL: 35.4 mg/dL (ref 0.0–40.0)

## 2019-01-06 LAB — PSA: PSA: 2.49 ng/mL (ref 0.10–4.00)

## 2019-01-06 LAB — POCT INR: INR: 2.4 (ref 2.0–3.0)

## 2019-01-06 NOTE — Assessment & Plan Note (Signed)
Stable per cards Will f/u with them in 2 years

## 2019-01-06 NOTE — Assessment & Plan Note (Addendum)
Pt / INR checked Lab Results  Component Value Date   INR 2.4 01/06/2019   INR 2.6 11/13/2018   INR 2.4 09/30/2018   rto 6 weeks

## 2019-01-06 NOTE — Assessment & Plan Note (Signed)
Encouraged heart healthy diet, increase exercise, avoid trans fats, consider a krill oil cap daily 

## 2019-01-06 NOTE — Assessment & Plan Note (Signed)
On coumadin 

## 2019-01-06 NOTE — Patient Instructions (Signed)
Preventive Care 82 Years and Older, Male Preventive care refers to lifestyle choices and visits with your health care provider that can promote health and wellness. This includes:  A yearly physical exam. This is also called an annual well check.  Regular dental and eye exams.  Immunizations.  Screening for certain conditions.  Healthy lifestyle choices, such as diet and exercise. What can I expect for my preventive care visit? Physical exam Your health care provider will check:  Height and weight. These may be used to calculate body mass index (BMI), which is a measurement that tells if you are at a healthy weight.  Heart rate and blood pressure.  Your skin for abnormal spots. Counseling Your health care provider may ask you questions about:  Alcohol, tobacco, and drug use.  Emotional well-being.  Home and relationship well-being.  Sexual activity.  Eating habits.  History of falls.  Memory and ability to understand (cognition).  Work and work Statistician. What immunizations do I need?  Influenza (flu) vaccine  This is recommended every year. Tetanus, diphtheria, and pertussis (Tdap) vaccine  You may need a Td booster every 10 years. Varicella (chickenpox) vaccine  You may need this vaccine if you have not already been vaccinated. Zoster (shingles) vaccine  You may need this after age 50. Pneumococcal conjugate (PCV13) vaccine  One dose is recommended after age 24. Pneumococcal polysaccharide (PPSV23) vaccine  One dose is recommended after age 33. Measles, mumps, and rubella (MMR) vaccine  You may need at least one dose of MMR if you were born in 1957 or later. You may also need a second dose. Meningococcal conjugate (MenACWY) vaccine  You may need this if you have certain conditions. Hepatitis A vaccine  You may need this if you have certain conditions or if you travel or work in places where you may be exposed to hepatitis A. Hepatitis B vaccine   You may need this if you have certain conditions or if you travel or work in places where you may be exposed to hepatitis B. Haemophilus influenzae type b (Hib) vaccine  You may need this if you have certain conditions. You may receive vaccines as individual doses or as more than one vaccine together in one shot (combination vaccines). Talk with your health care provider about the risks and benefits of combination vaccines. What tests do I need? Blood tests  Lipid and cholesterol levels. These may be checked every 82 years, or more frequently depending on your overall health.  Hepatitis C test.  Hepatitis B test. Screening  Lung cancer screening. You may have this screening every year starting at age 82 if you have a 30-pack-year history of smoking and currently smoke or have quit within the past 15 years.  Colorectal cancer screening. All adults should have this screening starting at age 82 and continuing until age 54. Your health care provider may recommend screening at age 47 if you are at increased risk. You will have tests every 1-10 years, depending on your results and the type of screening test.  Prostate cancer screening. Recommendations will vary depending on your family history and other risks.  Diabetes screening. This is done by checking your blood sugar (glucose) after you have not eaten for a while (fasting). You may have this done every 1-3 years.  Abdominal aortic aneurysm (AAA) screening. You may need this if you are a current or former smoker.  Sexually transmitted disease (STD) testing. Follow these instructions at home: Eating and drinking  Eat  a diet that includes fresh fruits and vegetables, whole grains, lean protein, and low-fat dairy products. Limit your intake of foods with high amounts of sugar, saturated fats, and salt.  Take vitamin and mineral supplements as recommended by your health care provider.  Do not drink alcohol if your health care provider  tells you not to drink.  If you drink alcohol: ? Limit how much you have to 0-2 drinks a day. ? Be aware of how much alcohol is in your drink. In the [place] one drink equals one 12 oz bottle of beer (355 mL), one 5 oz glass of wine (148 mL), or one 1 oz glass of hard liquor (44 mL). Lifestyle  Take daily care of your teeth and gums.  Stay active. Exercise for at least 30 minutes on 5 or more days each week.  Do not use any products that contain nicotine or tobacco, such as cigarettes, e-cigarettes, and chewing tobacco. If you need help quitting, ask your health care provider.  If you are sexually active, practice safe sex. Use a condom or other form of protection to prevent STIs (sexually transmitted infections).  Talk with your health care provider about taking a low-dose aspirin or statin. What's next?  Visit your health care provider once a year for a well check visit.  Ask your health care provider how often you should have your eyes and teeth checked.  Stay up to date on all vaccines. This information is not intended to replace advice given to you by your health care provider. Make sure you discuss any questions you have with your health care provider. Document Released: 02/18/2015 Document Revised: 01/16/2018 Document Reviewed: 01/16/2018 Elsevier Patient Education  2020 Elsevier Inc.  

## 2019-01-06 NOTE — Progress Notes (Signed)
Patient ID: Shawn [name], male    DOB: 02/22/1934  Age: 84 y.o. MRN: [id number]    Subjective:  Subjective  HPI Shawn [name] presents for cpe---- no complaints We will check his inr today too.     Review of Systems  Constitutional: Negative.   HENT: Negative for congestion, ear pain, hearing loss, nosebleeds, postnasal drip, rhinorrhea, sinus pressure, sneezing and tinnitus.   Eyes: Negative for photophobia, discharge, itching and visual disturbance.  Respiratory: Negative.   Cardiovascular: Negative.   Gastrointestinal: Negative for abdominal distention, abdominal pain, anal bleeding, blood in stool and constipation.  Endocrine: Negative.   Genitourinary: Negative.   Musculoskeletal: Negative.   Skin: Negative.   Allergic/Immunologic: Negative.   Neurological: Negative for dizziness, weakness, light-headedness, numbness and headaches.  Psychiatric/Behavioral: Negative for agitation, confusion, decreased concentration, dysphoric mood, sleep disturbance and suicidal ideas. The patient is not nervous/anxious.     History Past Medical History:  Diagnosis Date  . Allergy   . Cardiac murmur    mild aortic sclerosis  . Cataract    left eye  . Clotting disorder (HCC)    Factor v Leiden factor mutation  . Colon polyps    Hyperplastic  . Diverticulosis   . Dysphagia   . Factor V Leiden mutation ([place])   . Hemorrhoids   . History of blood clots   . Hyperlipidemia   . Osteoarthritis     He has a past surgical history that includes Appendectomy; Vasectomy; Tonsillectomy; Inguinal hernia repair; Cataract extraction (02/09/2007); and Varicose vein surgery.   His family history includes Arthritis in his mother; Heart attack (age of onset: 59) in his mother; Heart disease in his mother; Lung cancer in his father.He reports that he has never smoked. He has never used smokeless tobacco. He reports that he does not drink alcohol or use drugs.  Current  Outpatient Medications on File Prior to Visit  Medication Sig Dispense Refill  . Cholecalciferol ([employer] VITAMIN D3) 1000 UNITS capsule Take 2,000 Units by mouth daily.     . Cyanocobalamin (VITAMIN B-12) 5000 MCG TBDP Take 1 tablet by mouth daily.    . folic acid (FOLVITE) [id number] MCG tablet Take 400 mcg by mouth daily.    Marland Kitchen Hyaluronic Acid-Vitamin C (HYALURONIC ACID PO) Take 2 tablets by mouth daily. (200 mg)    . Krill Oil 1000 MG CAPS Take 1 capsule by mouth daily.    . Multiple Vitamin (MULTIVITAMIN) tablet Take 1 tablet by mouth daily.      . Omega-3 Fatty Acids (FISH OIL PO) Take 2,500 mg by mouth daily.    . Saw Palmetto 450-15 MG CAPS Take 1 capsule by mouth daily.    Marland Kitchen warfarin (COUMADIN) 5 MG tablet TAKE 1 TABLET BY MOUTH 4 days a week 48 tablet 0  . warfarin (COUMADIN) 7.5 MG tablet 7.5 MG DAILY 3 DAYS PER WEEK. 48 tablet 0   No current facility-administered medications on file prior to visit.      Objective:  Objective  Physical Exam Vitals signs and nursing note reviewed.  Constitutional:      General: He is sleeping. He is not in acute distress.    Appearance: He is well-developed. He is not diaphoretic.  HENT:     Head: Normocephalic and atraumatic.     Right Ear: External ear normal.     Left Ear: External ear normal.     Nose: Nose normal.     Mouth/Throat:  Pharynx: No oropharyngeal exudate.  Eyes:     General:        Right eye: No discharge.        Left eye: No discharge.     Conjunctiva/sclera: Conjunctivae normal.     Pupils: Pupils are equal, round, and reactive to light.  Neck:     Musculoskeletal: Normal range of motion and neck supple.     Thyroid: No thyromegaly.     Vascular: No JVD.  Cardiovascular:     Rate and Rhythm: Normal rate and regular rhythm.     Heart sounds: Murmur present. No friction rub. No gallop.   Pulmonary:     Effort: Pulmonary effort is normal. No respiratory distress.     Breath sounds: Normal breath sounds. No wheezing or  rales.  Chest:     Chest wall: No tenderness.  Abdominal:     General: Bowel sounds are normal. There is no distension.     Palpations: Abdomen is soft. There is no mass.     Tenderness: There is no abdominal tenderness. There is no guarding or rebound.  Genitourinary:    Comments: Pt refused Musculoskeletal: Normal range of motion.        General: No tenderness.  Lymphadenopathy:     Cervical: No cervical adenopathy.  Skin:    General: Skin is warm and dry.     Coloration: Skin is not pale.     Findings: No erythema or rash.  Neurological:     Mental Status: He is oriented to person, place, and time.     Motor: No abnormal muscle tone.     Deep Tendon Reflexes: Reflexes are normal and symmetric. Reflexes normal.  Psychiatric:        Behavior: Behavior normal.        Thought Content: Thought content normal.        Judgment: Judgment normal.    BP 136/70 (BP Location: Right Arm, Patient Position: Sitting, Cuff Size: Normal)   Pulse 66   Temp (!) 97.2 F (36.2 C) (Temporal)   Resp 18   Ht 5\' 8"  (1.727 m)   Wt 181 lb (82.1 kg)   SpO2 97%   BMI 27.52 kg/m  Wt Readings from Last 3 Encounters:  01/06/19 181 lb (82.1 kg)  11/14/18 181 lb (82.1 kg)  12/27/17 182 lb 9.6 oz (82.8 kg)     Lab Results  Component Value Date   WBC 3.7 (L) 12/27/2017   HGB 16.9 12/27/2017   HCT 48.1 12/27/2017   PLT 136.0 (L) 12/27/2017   GLUCOSE 98 12/27/2017   CHOL 208 (H) 12/27/2017   TRIG 195.0 (H) 12/27/2017   HDL 35.60 (L) 12/27/2017   LDLDIRECT 86.0 06/17/2014   LDLCALC 134 (H) 12/27/2017   ALT 23 12/27/2017   AST 25 12/27/2017   NA 141 12/27/2017   K 3.8 12/27/2017   CL 102 12/27/2017   CREATININE 0.97 12/27/2017   BUN 14 12/27/2017   CO2 34 (H) 12/27/2017   TSH 1.66 08/11/2007   PSA 2.17 12/27/2017   INR 2.4 01/06/2019   MICROALBUR 0.7 11/15/2014    US Venous Img Lower Unilateral Left  Result Date: 08/29/2017 CLINICAL DATA:  Left lower extremity pain and edema for the  past week. History of pulmonary embolism. Evaluate for DVT. EXAM: LEFT LOWER EXTREMITY VENOUS DOPPLER ULTRASOUND TECHNIQUE: Gray-scale sonography with graded compression, as well as color Doppler and duplex ultrasound were performed to evaluate the lower extremity deep venous systems from  the level of the common femoral vein and including the common femoral, femoral, profunda femoral, popliteal and calf veins including the posterior tibial, peroneal and gastrocnemius veins when visible. The superficial great saphenous vein was also interrogated. Spectral Doppler was utilized to evaluate flow at rest and with distal augmentation maneuvers in the common femoral, femoral and popliteal veins. COMPARISON:  None. FINDINGS: Contralateral Common Femoral Vein: Respiratory phasicity is normal and symmetric with the symptomatic side. No evidence of thrombus. Normal compressibility. Common Femoral Vein: No evidence of thrombus. Normal compressibility, respiratory phasicity and response to augmentation. Saphenofemoral Junction: No evidence of thrombus. Normal compressibility and flow on color Doppler imaging. Profunda Femoral Vein: No evidence of thrombus. Normal compressibility and flow on color Doppler imaging. Femoral Vein: No evidence of thrombus. Normal compressibility, respiratory phasicity and response to augmentation. Popliteal Vein: No evidence of thrombus. Normal compressibility, respiratory phasicity and response to augmentation. Calf Veins: No evidence of thrombus. Normal compressibility and flow on color Doppler imaging. Superficial Great Saphenous Vein: No evidence of thrombus. Normal compressibility. Venous Reflux:  None. Other Findings:  None. IMPRESSION: No evidence of DVT within the left lower extremity. Electronically Signed   By: Sandi Mariscal M.D.   On: 08/29/2017 10:37     Assessment & Plan:  Plan  I am having Damarcus Amador Cunas maintain his multivitamin, Cholecalciferol, Hyaluronic Acid-Vitamin  C (HYALURONIC ACID PO), Vitamin 0000000, folic acid, Omega-3 Fatty Acids (FISH OIL PO), Krill Oil, Saw Palmetto, warfarin, and warfarin.  No orders of the defined types were placed in this encounter.   Problem List Items Addressed This Visit      Unprioritized   Aortic valve disorder    Stable per cards Will f/u with them in 2 years      Factor V deficiency ([place])    Pt / INR checked Lab Results  Component Value Date   INR 2.4 01/06/2019   INR 2.6 11/13/2018   INR 2.4 09/30/2018   rto 6 weeks       Hyperlipidemia - Primary    Encouraged heart healthy diet, increase exercise, avoid trans fats, consider a krill oil cap daily      Relevant Orders   Lipid panel   Comprehensive metabolic panel   PULMONARY EMBOLISM, HX OF    On coumadin        Other Visit Diagnoses    Long term (current) use of anticoagulants       Relevant Orders   POCT INR (Completed)   Factor 5 Leiden mutation, heterozygous ([place])       History of BPH       Relevant Orders   PSA      Follow-up: Return in about 6 weeks (around 02/17/2019), or coumadin check----  6 months for f/u.  Ann Held, DO

## 2019-01-07 ENCOUNTER — Other Ambulatory Visit: Payer: Self-pay

## 2019-01-07 ENCOUNTER — Telehealth: Payer: Self-pay | Admitting: Family Medicine

## 2019-01-07 ENCOUNTER — Other Ambulatory Visit: Payer: Self-pay | Admitting: Family Medicine

## 2019-01-07 DIAGNOSIS — E785 Hyperlipidemia, unspecified: Secondary | ICD-10-CM

## 2019-01-07 DIAGNOSIS — R69 Illness, unspecified: Secondary | ICD-10-CM | POA: Diagnosis not present

## 2019-01-07 MED ORDER — ROSUVASTATIN CALCIUM 10 MG PO TABS: 10.0000 mg | ORAL_TABLET | Freq: Every day | ORAL | 2 refills | Status: DC

## 2019-01-07 NOTE — Telephone Encounter (Signed)
Spouse would like to speak with PCP nurse today regarding rosuvastatin (CRESTOR) 10 MG tablet  Stating the last statin patient was on made him disoriented, spouse would like to discuss

## 2019-01-07 NOTE — Telephone Encounter (Signed)
Pt tried pravastatin last time and it made him disorientated. I sent in the Crestor this morning after going over lab results. Please advise

## 2019-01-08 ENCOUNTER — Telehealth: Payer: Self-pay | Admitting: *Deleted

## 2019-01-08 NOTE — Telephone Encounter (Signed)
Referral placed.

## 2019-01-08 NOTE — Telephone Encounter (Signed)
Pt's wife states patient is refusing to take medication. Does not want to take a statin.

## 2019-01-08 NOTE — Telephone Encounter (Signed)
See other phone message  

## 2019-01-08 NOTE — Telephone Encounter (Signed)
Try takin 3x a week to start and see how he does

## 2019-01-08 NOTE — Telephone Encounter (Signed)
Please refer to lipid clinic 

## 2019-01-08 NOTE — Telephone Encounter (Signed)
Copied from [place] [phone]. Topic: General - Other >> Jan 08, 2019  9:47 AM Greggory Keen D wrote: Reason for CRM: pt's wife called saying her husband had a physical Tuesday with Dr. Etter Sjogren.  She had prescribed Crestor for him but he does not want to take a statin drug.  He gets confusion very bad with these drugs.  CB# [phone]

## 2019-01-21 ENCOUNTER — Other Ambulatory Visit: Payer: Self-pay | Admitting: Family Medicine

## 2019-01-26 ENCOUNTER — Other Ambulatory Visit: Payer: Self-pay | Admitting: Family Medicine

## 2019-01-26 DIAGNOSIS — I2699 Other pulmonary embolism without acute cor pulmonale: Secondary | ICD-10-CM

## 2019-02-02 ENCOUNTER — Telehealth: Payer: Self-pay | Admitting: Family Medicine

## 2019-02-02 NOTE — Telephone Encounter (Signed)
Copied from [place] [id number]. Topic: Quick Communication - See Telephone Encounter >> Feb 02, 2019 11:38 AM Loma [place] wrote: CRM for notification. See Telephone encounter for: 02/02/19. Pt states that on 12/3 when notified of his test results that he had requested a copy for the [place] and he never received it. Please send out copy of lab wok from 12/02.   Please resend labs - thank you!

## 2019-02-03 NOTE — Telephone Encounter (Signed)
Labs mailed

## 2019-02-12 DIAGNOSIS — Z0100 Encounter for examination of eyes and vision without abnormal findings: Secondary | ICD-10-CM | POA: Diagnosis not present

## 2019-02-12 DIAGNOSIS — H524 Presbyopia: Secondary | ICD-10-CM | POA: Diagnosis not present

## 2019-02-12 DIAGNOSIS — H02831 Dermatochalasis of right upper eyelid: Secondary | ICD-10-CM | POA: Diagnosis not present

## 2019-02-12 DIAGNOSIS — H02834 Dermatochalasis of left upper eyelid: Secondary | ICD-10-CM | POA: Diagnosis not present

## 2019-02-12 DIAGNOSIS — H10413 Chronic giant papillary conjunctivitis, bilateral: Secondary | ICD-10-CM | POA: Diagnosis not present

## 2019-02-12 DIAGNOSIS — Z961 Presence of intraocular lens: Secondary | ICD-10-CM | POA: Diagnosis not present

## 2019-02-12 DIAGNOSIS — H04123 Dry eye syndrome of bilateral lacrimal glands: Secondary | ICD-10-CM | POA: Diagnosis not present

## 2019-02-16 ENCOUNTER — Telehealth: Payer: Self-pay | Admitting: *Deleted

## 2019-02-16 NOTE — Telephone Encounter (Signed)
Please advise 

## 2019-02-16 NOTE — Telephone Encounter (Signed)
They should go through [facility] -- make appointment through my chart once appointments open up again  Or give her number to make app with [facility]

## 2019-02-16 NOTE — Telephone Encounter (Signed)
Copied from [place] [id number]. Topic: General - Inquiry >> Feb 16, 2019  8:28 AM Reyne Dumas L wrote: Reason for CRM:   Pt's wife, Remo Lipps, calling.  States that she tried to make pt an appointment for the COVID vaccine through the health department at the [facility] and was told since pt is on blood thinners they will not give vaccine and they need to go through their PCP.  Pt's wife needs to know what to do next. Remo Lipps can be reached at [phone]

## 2019-02-17 ENCOUNTER — Ambulatory Visit: Payer: Medicare HMO | Admitting: Family Medicine

## 2019-02-18 ENCOUNTER — Other Ambulatory Visit: Payer: Self-pay

## 2019-02-19 ENCOUNTER — Ambulatory Visit: Payer: Medicare HMO | Admitting: Family Medicine

## 2019-02-19 ENCOUNTER — Ambulatory Visit (INDEPENDENT_AMBULATORY_CARE_PROVIDER_SITE_OTHER): Payer: Medicare HMO | Admitting: *Deleted

## 2019-02-19 DIAGNOSIS — Z7901 Long term (current) use of anticoagulants: Secondary | ICD-10-CM | POA: Diagnosis not present

## 2019-02-19 NOTE — Progress Notes (Signed)
Pt here for INR check per Dr. Etter Sjogren  Goal INR =2.0 to 3.0  Last INR =2.4  Pt currently takes Coumadin5 mg 4 days a week and 7.5 mg 3 days a week.  Pt denies recent antibiotics, no dietary changes and no unusual bruising / bleeding.  INR today =2.1  Per Dr. Etter Sjogren ok to continue same dose and return in 6 weeks.  Appointment made for patient for 6 weeks.

## 2019-02-20 LAB — POCT INR: INR: 2.1 (ref 2.0–3.0)

## 2019-04-07 ENCOUNTER — Ambulatory Visit (INDEPENDENT_AMBULATORY_CARE_PROVIDER_SITE_OTHER): Payer: Medicare HMO | Admitting: *Deleted

## 2019-04-07 ENCOUNTER — Other Ambulatory Visit: Payer: Self-pay

## 2019-04-07 DIAGNOSIS — Z7901 Long term (current) use of anticoagulants: Secondary | ICD-10-CM

## 2019-04-07 LAB — POCT INR: INR: 2.5 (ref 2.0–3.0)

## 2019-04-07 NOTE — Progress Notes (Signed)
Pt here for INR check perDr. Etter Sjogren  Goal INR =2.0 to 3.0  Last INR =2.1  Pt currently takes Coumadin5 mg 4 days a week and 7.5 mg 3 days a week.  Pt denies recent antibiotics, no dietary changes and no unusual bruising / bleeding.  INR today =2.5  Per Dr. Etter Sjogren as verbal ok to continue same dose and recheck in 6 weeks.  Appointment scheduled .

## 2019-04-15 ENCOUNTER — Other Ambulatory Visit: Payer: Self-pay | Admitting: Family Medicine

## 2019-04-15 DIAGNOSIS — I2699 Other pulmonary embolism without acute cor pulmonale: Secondary | ICD-10-CM

## 2019-04-16 ENCOUNTER — Other Ambulatory Visit: Payer: Self-pay | Admitting: Family Medicine

## 2019-05-22 ENCOUNTER — Other Ambulatory Visit: Payer: Self-pay

## 2019-05-22 ENCOUNTER — Ambulatory Visit (INDEPENDENT_AMBULATORY_CARE_PROVIDER_SITE_OTHER): Payer: Medicare HMO | Admitting: *Deleted

## 2019-05-22 DIAGNOSIS — Z7901 Long term (current) use of anticoagulants: Secondary | ICD-10-CM | POA: Diagnosis not present

## 2019-05-22 LAB — POCT INR: INR: 3.3 — AB (ref 2.0–3.0)

## 2019-05-22 NOTE — Progress Notes (Signed)
Pt here for INR check perDr. Etter Sjogren  Goal INR =2.0 to 3.0  Last INR =2.5  Pt currently takes Coumadin5 mg 4 days a week and 7.5 mg 3 days a week.  Pt denies recent antibiotics, no dietary changes and no unusual bruising / bleeding.  INR today = 3.3   Per Dr. Carollee Herter continue same dose and recheck in 2 weeks.  Appointment made for 2 weeks.

## 2019-06-05 ENCOUNTER — Ambulatory Visit (INDEPENDENT_AMBULATORY_CARE_PROVIDER_SITE_OTHER): Payer: Medicare HMO

## 2019-06-05 ENCOUNTER — Other Ambulatory Visit: Payer: Self-pay

## 2019-06-05 DIAGNOSIS — Z7901 Long term (current) use of anticoagulants: Secondary | ICD-10-CM

## 2019-06-05 LAB — POCT INR: INR: 2.9 (ref 2.0–3.0)

## 2019-06-05 NOTE — Progress Notes (Signed)
Pt here for INR check per Dr. Etter Sjogren  Goal INR = 2.0-3.0  Last INR =3.3  Pt currently takes Coumadin 5 mg, 4 days a week and 7.5 mg 3 days a week  Pt denies recent antibiotics, no dietary changes and no unusual bruising / bleeding.  INR today = 2.9  Patient is to continue current regimen. Next nv scheduled for June.

## 2019-07-08 DIAGNOSIS — R69 Illness, unspecified: Secondary | ICD-10-CM | POA: Diagnosis not present

## 2019-07-09 ENCOUNTER — Other Ambulatory Visit: Payer: Self-pay | Admitting: Family Medicine

## 2019-07-16 ENCOUNTER — Other Ambulatory Visit: Payer: Self-pay

## 2019-07-16 ENCOUNTER — Ambulatory Visit (INDEPENDENT_AMBULATORY_CARE_PROVIDER_SITE_OTHER): Payer: Medicare HMO

## 2019-07-16 DIAGNOSIS — Z7901 Long term (current) use of anticoagulants: Secondary | ICD-10-CM | POA: Diagnosis not present

## 2019-07-16 LAB — POCT INR: INR: 2 (ref 2.0–3.0)

## 2019-07-16 NOTE — Progress Notes (Signed)
Pt here for INR check per  Goal INR =2.0 TO 3.0  Last INR =2.9  Pt currently takes Coumadin 5 mg, 4 days a week and 7.5 mg 3 days a week  Pt denies recent antibiotics, no dietary changes and no unusual bruising / bleeding.  INR today = 2.0  Pt advised per Dr. Etter Sjogren to continue the same and returne in 6 weeks for inr check. Appointment scheduled for 08-13-2019.

## 2019-07-16 NOTE — Progress Notes (Signed)
Reviewed    agree Ann Held, DO

## 2019-07-17 ENCOUNTER — Ambulatory Visit: Payer: Medicare HMO

## 2019-08-13 ENCOUNTER — Ambulatory Visit: Payer: Medicare HMO

## 2019-08-13 ENCOUNTER — Other Ambulatory Visit: Payer: Self-pay

## 2019-08-13 ENCOUNTER — Ambulatory Visit (INDEPENDENT_AMBULATORY_CARE_PROVIDER_SITE_OTHER): Payer: Medicare HMO

## 2019-08-13 DIAGNOSIS — Z7901 Long term (current) use of anticoagulants: Secondary | ICD-10-CM

## 2019-08-13 LAB — POCT INR: INR: 2.7 (ref 2.0–3.0)

## 2019-08-13 NOTE — Progress Notes (Signed)
Pt here for INR check per  Goal INR =2.0 to 3.0  Last INR =2.0  Pt currently takes Coumadin 5 mg, 4 days a week and 7.5 mg 3 days a week  Pt denies recent antibiotics, no dietary changes and no unusual bruising / bleeding.  INR today = 2.7  Pt advised per Dr. Charlett Blake continue the same and ok to return in 6 weeks.  Appointment scheduled for 09-24-2019.

## 2019-09-13 IMAGING — US US EXTREM LOW VENOUS*L*
1 series · 13 of 24 positions shown · non-contrast
Comparison: None.

CLINICAL DATA: Left lower extremity pain and edema for the past
week. History of pulmonary embolism. Evaluate for DVT.



[Series 1: us extrem low venous*left* · 0.08mm/px · 13 of 30 slices shown]
[im 1/30]
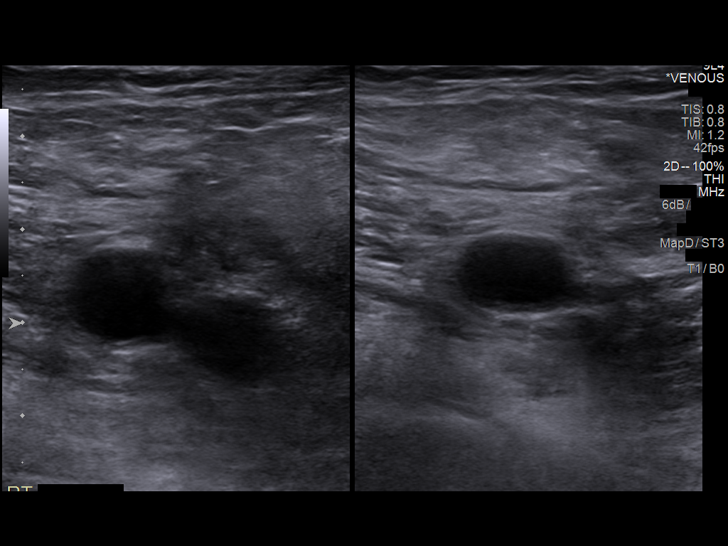
[im 3/30]
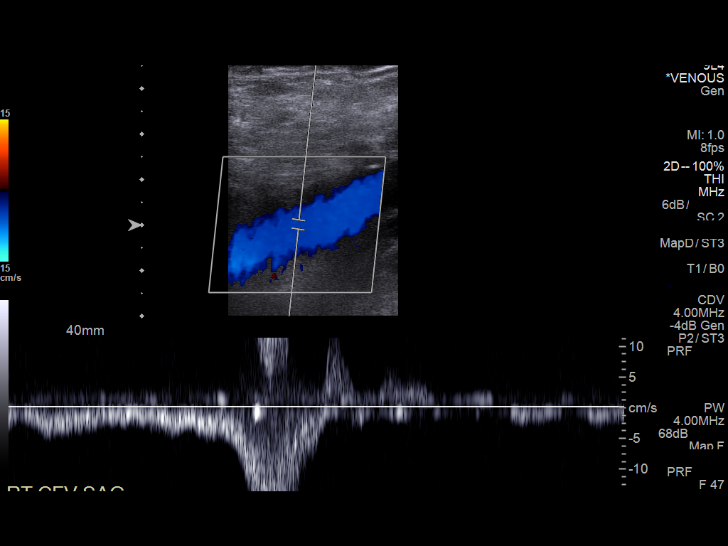
[im 6/30]
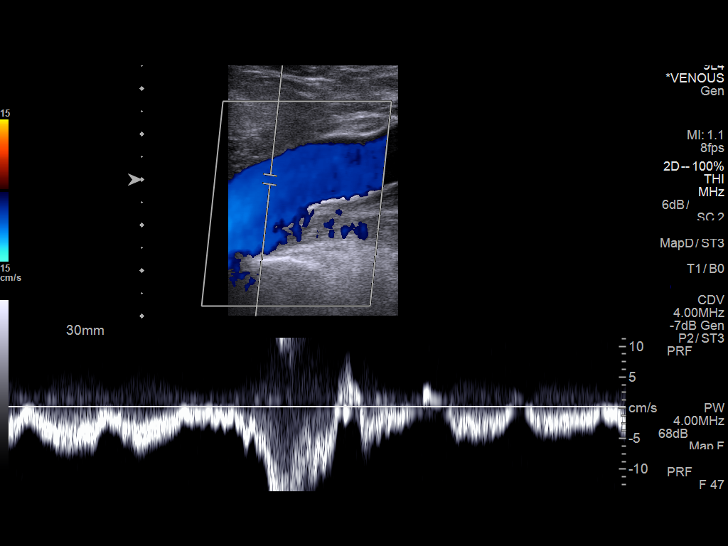
[im 8/30]
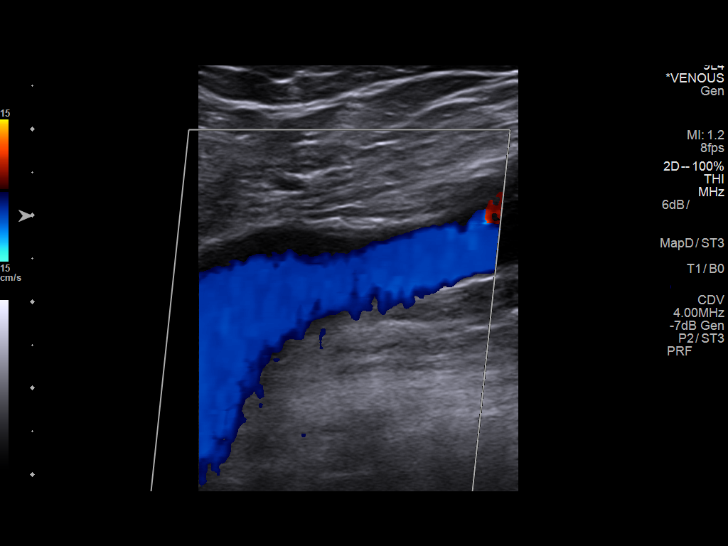
[im 11/30]
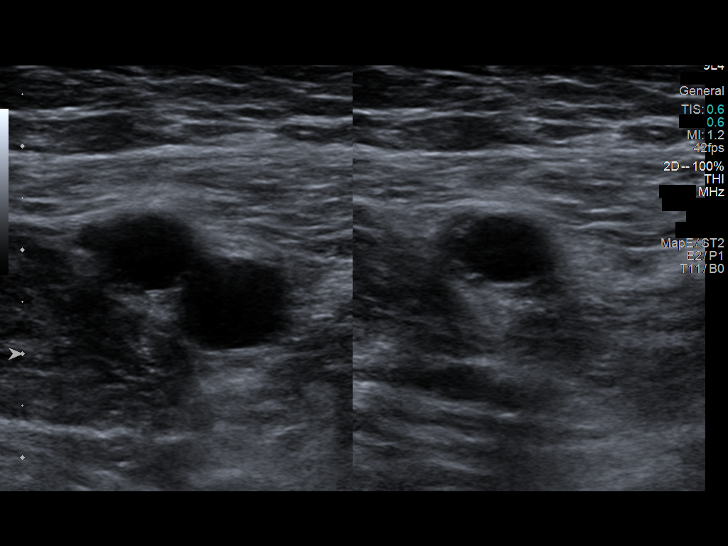
[im 13/30]
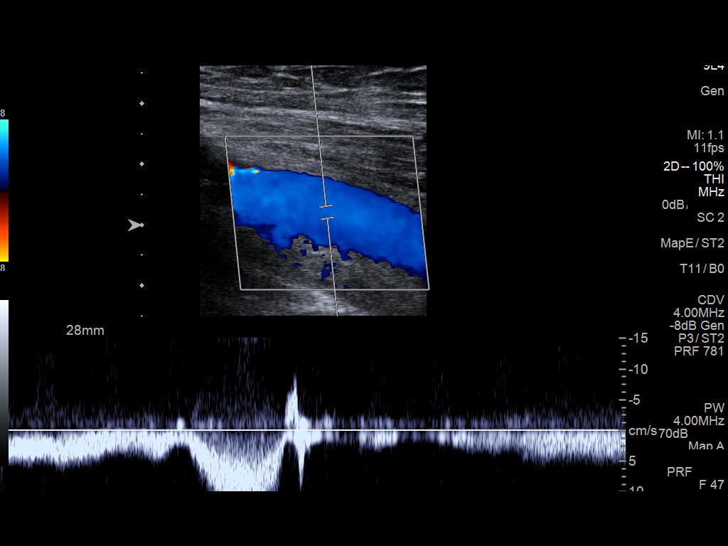
[im 16/30]
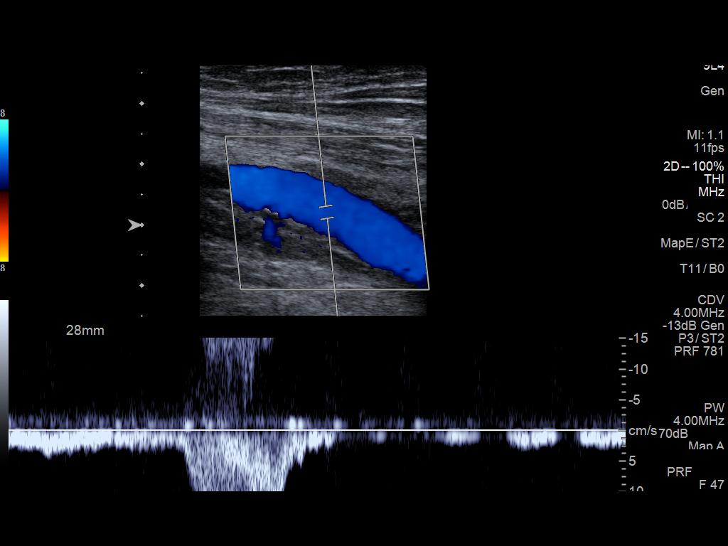
[im 17/30]
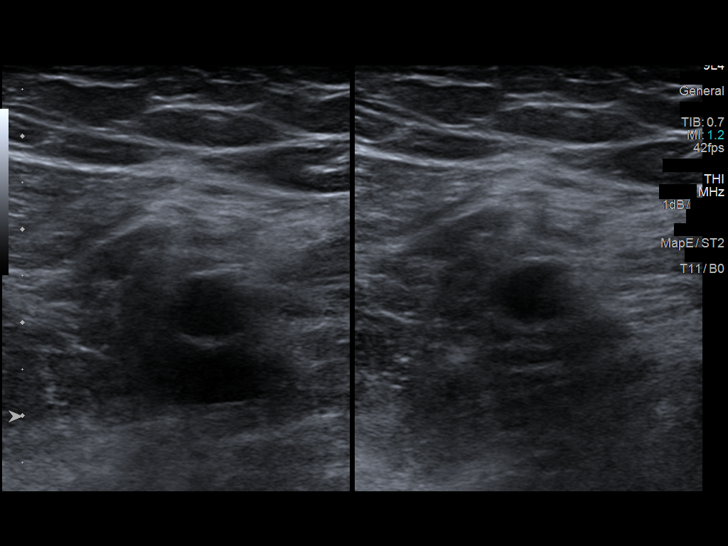
[im 19/30]
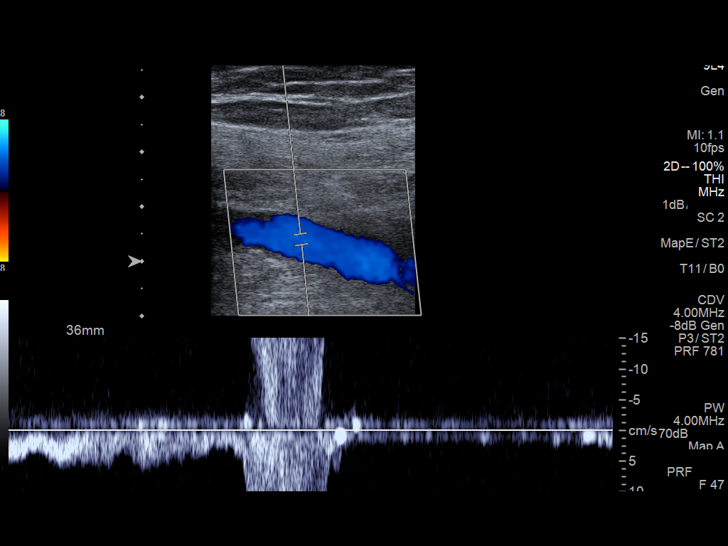
[im 22/30]
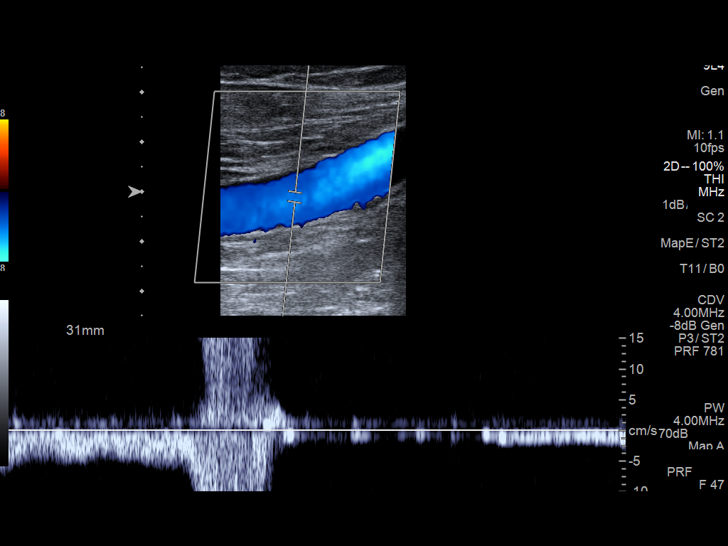
[im 24/30]
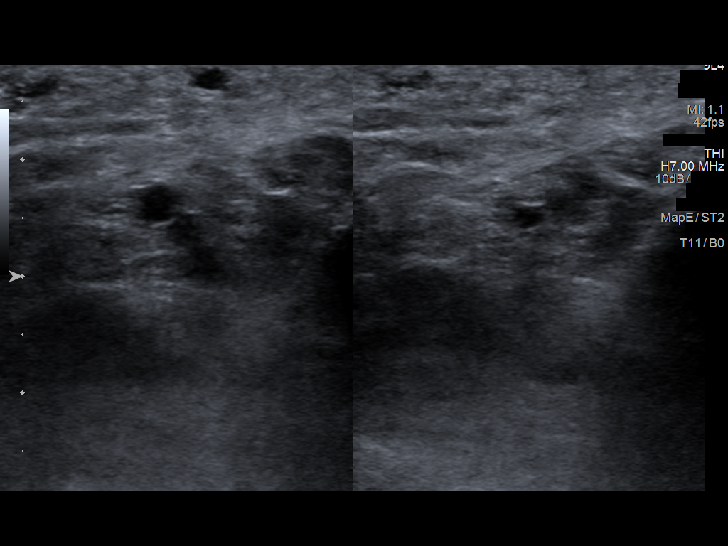
[im 27/30]
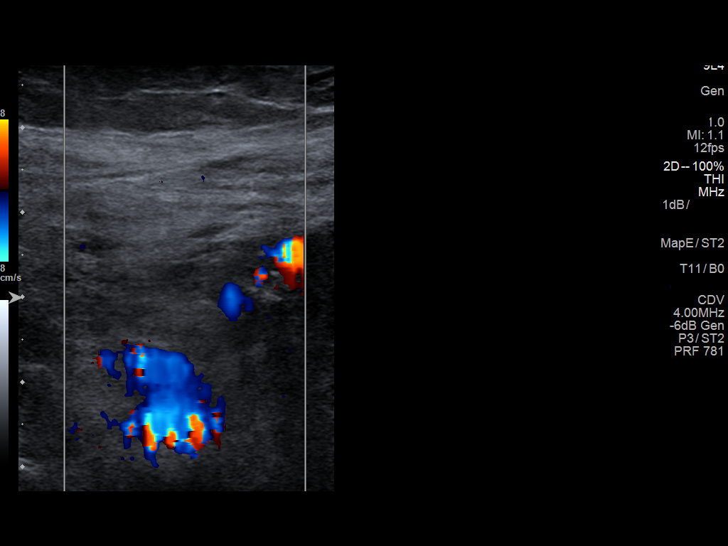
[im 30/30]
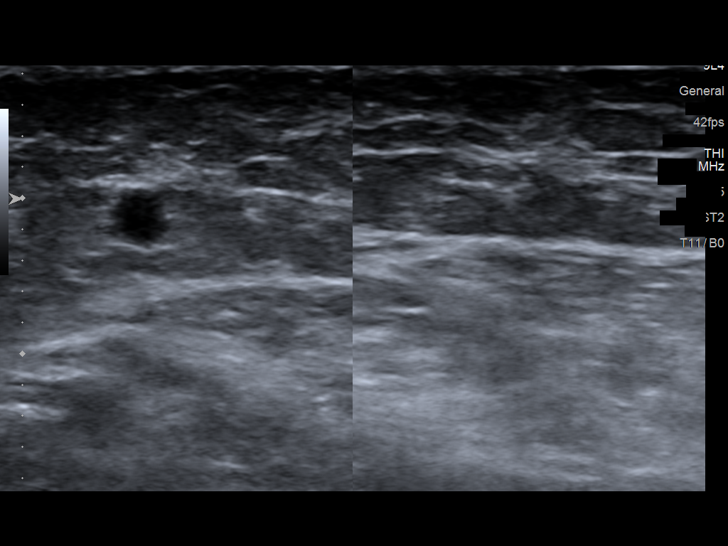

[13 of 24 positions shown; findings below may reference images not displayed]

FINDINGS: Contralateral Common Femoral Vein: Respiratory phasicity is normal
and symmetric with the symptomatic side. No evidence of thrombus.
Normal compressibility.

Common Femoral Vein: No evidence of thrombus. Normal
compressibility, respiratory phasicity and response to augmentation.

Saphenofemoral Junction: No evidence of thrombus. Normal
compressibility and flow on color Doppler imaging.

Profunda Femoral Vein: No evidence of thrombus. Normal
compressibility and flow on color Doppler imaging.

Femoral Vein: No evidence of thrombus. Normal compressibility,
respiratory phasicity and response to augmentation.

Popliteal Vein: No evidence of thrombus. Normal compressibility,
respiratory phasicity and response to augmentation.

Calf Veins: No evidence of thrombus. Normal compressibility and flow
on color Doppler imaging.

Superficial Great Saphenous Vein: No evidence of thrombus. Normal
compressibility.

Venous Reflux:  None.

Other Findings:  None.
IMPRESSION: No evidence of DVT within the left lower extremity.

## 2019-09-24 ENCOUNTER — Other Ambulatory Visit: Payer: Self-pay | Admitting: Family Medicine

## 2019-09-24 ENCOUNTER — Other Ambulatory Visit: Payer: Self-pay

## 2019-09-24 ENCOUNTER — Ambulatory Visit (INDEPENDENT_AMBULATORY_CARE_PROVIDER_SITE_OTHER): Payer: Medicare HMO

## 2019-09-24 DIAGNOSIS — Z7901 Long term (current) use of anticoagulants: Secondary | ICD-10-CM | POA: Diagnosis not present

## 2019-09-24 LAB — POCT INR: INR: 2.6 (ref 2.0–3.0)

## 2019-09-24 NOTE — Progress Notes (Signed)
Pt here for INR check per  Goal INR = 2 to 3  Last INR = 2.7  Pt currently takes Coumadin Pt currently takes Coumadin 5 mg, 4 days a week and 7.5 mg 3 days a week  Pt denies recent antibiotics, no dietary changes and no unusual bruising / bleeding.  INR today = 2.6  Pt advised per Dr. Etter Sjogren continue the same and come back for INR check in about 6 weeks, patient requested 11-04-19 at 11am, appointment was schedued.

## 2019-09-24 NOTE — Progress Notes (Signed)
Reviewed  Grey Rakestraw R Lowne Chase, DO  

## 2019-09-26 ENCOUNTER — Other Ambulatory Visit: Payer: Self-pay | Admitting: Family Medicine

## 2019-09-26 DIAGNOSIS — I2699 Other pulmonary embolism without acute cor pulmonale: Secondary | ICD-10-CM

## 2019-10-25 ENCOUNTER — Other Ambulatory Visit: Payer: Self-pay | Admitting: Family Medicine

## 2019-10-25 DIAGNOSIS — I2699 Other pulmonary embolism without acute cor pulmonale: Secondary | ICD-10-CM

## 2019-11-04 ENCOUNTER — Ambulatory Visit (INDEPENDENT_AMBULATORY_CARE_PROVIDER_SITE_OTHER): Payer: Medicare HMO

## 2019-11-04 ENCOUNTER — Other Ambulatory Visit: Payer: Self-pay

## 2019-11-04 DIAGNOSIS — Z7901 Long term (current) use of anticoagulants: Secondary | ICD-10-CM

## 2019-11-04 LAB — POCT INR: INR: 2.1 (ref 2.0–3.0)

## 2019-11-04 NOTE — Progress Notes (Signed)
Pt here today for INR check.   Last INR: 2.6  Goal: 2.0-3.0.   Pt currently taking Coumadin 5mg  4 days weekly and 7.5mg  3 days weekly/all other days.   Pt denies missed doses, bleeding, change in diet or abx usage.   INR today: 2.1  Pt instructed to continue same dose and recheck 6 weeks.   Nurse visit scheduled 12/16/2019.

## 2019-11-04 NOTE — Patient Instructions (Signed)
Continue same dosage. Recheck 6 weeks.

## 2019-11-04 NOTE — Progress Notes (Signed)
Noted  Axel Frisk R Lowne Chase, DO  

## 2019-11-11 ENCOUNTER — Ambulatory Visit (INDEPENDENT_AMBULATORY_CARE_PROVIDER_SITE_OTHER): Payer: Medicare HMO

## 2019-11-11 ENCOUNTER — Other Ambulatory Visit: Payer: Self-pay

## 2019-11-11 DIAGNOSIS — Z23 Encounter for immunization: Secondary | ICD-10-CM

## 2019-12-16 ENCOUNTER — Ambulatory Visit (INDEPENDENT_AMBULATORY_CARE_PROVIDER_SITE_OTHER): Payer: Medicare HMO | Admitting: Internal Medicine

## 2019-12-16 ENCOUNTER — Other Ambulatory Visit: Payer: Self-pay

## 2019-12-16 DIAGNOSIS — Z7901 Long term (current) use of anticoagulants: Secondary | ICD-10-CM

## 2019-12-16 LAB — POCT INR: INR: 1.9 — AB (ref 2.0–3.0)

## 2019-12-16 NOTE — Progress Notes (Addendum)
Pt here today for INR check.   Last INR: 2.1  Goal: 2.0-3.0.  Pt currently taking Coumadin 5mg  4 days weekly and 7.5mg  3 days weekly/all other days.   Pt denies missed doses, bleeding, change in diet or abx usage.   INR today:1.9  Dr. Larose Kells  Instructed pt to continue to take his  Regiment listed above but to add an extra half tablet today and tomorrow and will recheck in 3 weeks  Nurse visit scheduled 01/06/20  Kathlene November, MD

## 2019-12-18 ENCOUNTER — Other Ambulatory Visit: Payer: Self-pay | Admitting: Family Medicine

## 2020-01-06 ENCOUNTER — Other Ambulatory Visit: Payer: Self-pay

## 2020-01-06 ENCOUNTER — Ambulatory Visit (INDEPENDENT_AMBULATORY_CARE_PROVIDER_SITE_OTHER): Payer: Medicare HMO

## 2020-01-06 DIAGNOSIS — D696 Thrombocytopenia, unspecified: Secondary | ICD-10-CM

## 2020-01-06 LAB — POCT INR: INR: 2.5 (ref 2.0–3.0)

## 2020-01-06 NOTE — Progress Notes (Signed)
Pt here for INR check per  Goal INR =2.0-3.0  Last INR =1.9  Pt currently takes Coumadin   Pt denies recent antibiotics, no dietary changes and no unusual bruising / bleeding.  INR today = 2.5  Pt advised per  Larose Kells MD- continue regimen INR check in 4 weeks Scheduled for 02/10/20

## 2020-01-07 ENCOUNTER — Encounter: Payer: Medicare HMO | Admitting: Family Medicine

## 2020-01-11 ENCOUNTER — Encounter: Payer: Self-pay | Admitting: Family Medicine

## 2020-01-11 ENCOUNTER — Telehealth: Payer: Self-pay | Admitting: *Deleted

## 2020-01-11 ENCOUNTER — Other Ambulatory Visit: Payer: Self-pay

## 2020-01-11 ENCOUNTER — Encounter: Payer: Medicare HMO | Admitting: Family Medicine

## 2020-01-11 ENCOUNTER — Ambulatory Visit (INDEPENDENT_AMBULATORY_CARE_PROVIDER_SITE_OTHER): Payer: Medicare HMO | Admitting: Family Medicine

## 2020-01-11 VITALS — BP 108/70 | HR 70 | Temp 98.1°F | Resp 18 | Ht 68.0 in | Wt 187.8 lb

## 2020-01-11 DIAGNOSIS — E785 Hyperlipidemia, unspecified: Secondary | ICD-10-CM

## 2020-01-11 DIAGNOSIS — R351 Nocturia: Secondary | ICD-10-CM | POA: Diagnosis not present

## 2020-01-11 DIAGNOSIS — D682 Hereditary deficiency of other clotting factors: Secondary | ICD-10-CM | POA: Diagnosis not present

## 2020-01-11 DIAGNOSIS — R413 Other amnesia: Secondary | ICD-10-CM

## 2020-01-11 DIAGNOSIS — Z0001 Encounter for general adult medical examination with abnormal findings: Secondary | ICD-10-CM | POA: Diagnosis not present

## 2020-01-11 DIAGNOSIS — Z0000 Encounter for general adult medical examination without abnormal findings: Secondary | ICD-10-CM

## 2020-01-11 LAB — CBC WITH DIFFERENTIAL/PLATELET
Basophils Absolute: 0 10*3/uL (ref 0.0–0.1)
Basophils Relative: 0.4 % (ref 0.0–3.0)
Eosinophils Absolute: 0.1 10*3/uL (ref 0.0–0.7)
Eosinophils Relative: 2.2 % (ref 0.0–5.0)
HCT: 48.5 % (ref 39.0–52.0)
Hemoglobin: 16.9 g/dL (ref 13.0–17.0)
Lymphocytes Relative: 36 % (ref 12.0–46.0)
Lymphs Abs: 1.4 10*3/uL (ref 0.7–4.0)
MCHC: 34.9 g/dL (ref 30.0–36.0)
MCV: 97.5 fl (ref 78.0–100.0)
Monocytes Absolute: 0.4 10*3/uL (ref 0.1–1.0)
Monocytes Relative: 10.5 % (ref 3.0–12.0)
Neutro Abs: 1.9 10*3/uL (ref 1.4–7.7)
Neutrophils Relative %: 50.9 % (ref 43.0–77.0)
Platelets: 136 10*3/uL — ABNORMAL LOW (ref 150.0–400.0)
RBC: 4.97 Mil/uL (ref 4.22–5.81)
RDW: 13 % (ref 11.5–15.5)
WBC: 3.7 10*3/uL — ABNORMAL LOW (ref 4.0–10.5)

## 2020-01-11 LAB — COMPREHENSIVE METABOLIC PANEL
ALT: 20 U/L (ref 0–53)
AST: 22 U/L (ref 0–37)
Albumin: 4.3 g/dL (ref 3.5–5.2)
Alkaline Phosphatase: 42 U/L (ref 39–117)
BUN: 14 mg/dL (ref 6–23)
CO2: 33 mEq/L — ABNORMAL HIGH (ref 19–32)
Calcium: 9.2 mg/dL (ref 8.4–10.5)
Chloride: 101 mEq/L (ref 96–112)
Creatinine, Ser: 0.95 mg/dL (ref 0.40–1.50)
GFR: 74.3 mL/min (ref >60.00)
Glucose, Bld: 93 mg/dL (ref 70–99)
Potassium: 4.7 mEq/L (ref 3.5–5.1)
Sodium: 141 mEq/L (ref 135–145)
Total Bilirubin: 1.4 mg/dL — ABNORMAL HIGH (ref 0.2–1.2)
Total Protein: 7.1 g/dL (ref 6.0–8.3)

## 2020-01-11 LAB — LIPID PANEL
Cholesterol: 203 mg/dL — ABNORMAL HIGH (ref 0–200)
HDL: 35.3 mg/dL — ABNORMAL LOW (ref >39.00)
LDL Cholesterol: 132 mg/dL — ABNORMAL HIGH (ref 0–99)
NonHDL: 167.6
Total CHOL/HDL Ratio: 6
Triglycerides: 178 mg/dL — ABNORMAL HIGH (ref 0.0–149.0)
VLDL: 35.6 mg/dL (ref 0.0–40.0)

## 2020-01-11 LAB — PSA: PSA: 2.02 ng/mL (ref 0.10–4.00)

## 2020-01-11 LAB — TSH: TSH: 1.73 u[IU]/mL (ref 0.35–4.50)

## 2020-01-11 LAB — VITAMIN D 25 HYDROXY (VIT D DEFICIENCY, FRACTURES): VITD: >120 ng/mL

## 2020-01-11 LAB — VITAMIN B12: Vitamin B-12: >1526 pg/mL — ABNORMAL HIGH (ref 211–911)

## 2020-01-11 NOTE — Progress Notes (Signed)
Patient ID: Shawn [name], male    DOB: 23-Sep-1934  Age: 85 y.o. MRN: [id number]    Subjective:  Subjective  HPI  Shawn [name] presents for memory loss and cpe.  His wife states the family has noticed a big change.   He loses words mid sentence.  They are very concerned.  The pt states he does not feel that it is that bad but his wife and daughter   feel it it is a bigger problem.     Review of Systems  Constitutional: Negative.   HENT: Negative for congestion, ear pain, hearing loss, nosebleeds, postnasal drip, rhinorrhea, sinus pressure, sneezing and tinnitus.   Eyes: Negative for photophobia, discharge, itching and visual disturbance.  Respiratory: Negative.   Cardiovascular: Negative.   Gastrointestinal: Negative for abdominal distention, abdominal pain, anal bleeding, blood in stool and constipation.  Endocrine: Negative.   Genitourinary: Negative.   Musculoskeletal: Negative.   Skin: Negative.   Allergic/Immunologic: Negative.   Neurological: Negative for dizziness, weakness, light-headedness, numbness and headaches.  Psychiatric/Behavioral: Positive for confusion and decreased concentration. Negative for agitation, dysphoric mood, sleep disturbance and suicidal ideas. The patient is not nervous/anxious.     History Past Medical History:  Diagnosis Date  . Allergy   . Cardiac murmur    mild aortic sclerosis  . Cataract    left eye  . Clotting disorder (HCC)    Factor v Leiden factor mutation  . Colon polyps    Hyperplastic  . Diverticulosis   . Dysphagia   . Factor V Leiden mutation ([place])   . Hemorrhoids   . History of blood clots   . Hyperlipidemia   . Osteoarthritis     He has a past surgical history that includes Appendectomy; Vasectomy; Tonsillectomy; Inguinal hernia repair; Cataract extraction (02/09/2007); and Varicose vein surgery.   His family history includes Arthritis in his mother; Heart attack (age of onset: 43) in his mother;  Heart disease in his mother; Lung cancer in his father.He reports that he has never smoked. He has never used smokeless tobacco. He reports that he does not drink alcohol and does not use drugs.  Current Outpatient Medications on File Prior to Visit  Medication Sig Dispense Refill  . Cholecalciferol ([employer] VITAMIN D3) 1000 UNITS capsule Take 2,000 Units by mouth daily.     . Cyanocobalamin (VITAMIN B-12) 5000 MCG TBDP Take 1 tablet by mouth daily.    . folic acid (FOLVITE) 518 MCG tablet Take 400 mcg by mouth daily.    Marland Kitchen Hyaluronic Acid-Vitamin C (HYALURONIC ACID PO) Take 2 tablets by mouth daily. (200 mg)    . Multiple Vitamin (MULTIVITAMIN) tablet Take 1 tablet by mouth daily.      . Omega-3 Fatty Acids (FISH OIL PO) Take 2,500 mg by mouth daily.    . rosuvastatin (CRESTOR) 10 MG tablet Take 1 tablet (10 mg total) by mouth daily. 30 tablet 2  . Saw Palmetto 450-15 MG CAPS Take 1 capsule by mouth daily.    Marland Kitchen warfarin (COUMADIN) 5 MG tablet TAKE 1 TABLET BY MOUTH 4 DAYS A WEEK 48 tablet 1  . warfarin (COUMADIN) 7.5 MG tablet TAKE AS DIRECTED BY COUMADIN CLINIC 90 tablet 0  . Krill Oil 1000 MG CAPS Take 1 capsule by mouth daily. (Patient not taking: Reported on 01/11/2020)     No current facility-administered medications on file prior to visit.     Objective:  Objective  Physical Exam Vitals reviewed.  Constitutional:  General: He is not in acute distress.    Appearance: He is well-developed. He is not diaphoretic.  HENT:     Head: Normocephalic and atraumatic.     Right Ear: External ear normal.     Left Ear: External ear normal.     Nose: Nose normal.     Mouth/Throat:     Pharynx: No oropharyngeal exudate.  Eyes:     General:        Right eye: No discharge.        Left eye: No discharge.     Conjunctiva/sclera: Conjunctivae normal.     Pupils: Pupils are equal, round, and reactive to light.  Neck:     Thyroid: No thyromegaly.     Vascular: No JVD.  Cardiovascular:      Rate and Rhythm: Normal rate and regular rhythm.     Heart sounds: No murmur heard.  No friction rub. No gallop.   Pulmonary:     Effort: Pulmonary effort is normal. No respiratory distress.     Breath sounds: Normal breath sounds. No wheezing or rales.  Chest:     Chest wall: No tenderness.  Abdominal:     General: Bowel sounds are normal. There is no distension.     Palpations: Abdomen is soft. There is no mass.     Tenderness: There is no abdominal tenderness. There is no guarding or rebound.  Musculoskeletal:        General: No tenderness. Normal range of motion.     Cervical back: Normal range of motion and neck supple.  Lymphadenopathy:     Cervical: No cervical adenopathy.  Skin:    General: Skin is warm and dry.     Coloration: Skin is not pale.     Findings: No erythema or rash.  Neurological:     Mental Status: He is alert and oriented to person, place, and time.     Motor: No abnormal muscle tone.     Deep Tendon Reflexes: Reflexes are normal and symmetric. Reflexes normal.     Comments: mmse--- 23/30  Psychiatric:        Behavior: Behavior normal.        Thought Content: Thought content normal.        Judgment: Judgment normal.    BP 108/70 (BP Location: Right Arm, Patient Position: Sitting, Cuff Size: Normal)   Pulse 70   Temp 98.1 F (36.7 C) (Oral)   Resp 18   Ht 5\' 8"  (1.727 m)   Wt 187 lb 12.8 oz (85.2 kg)   SpO2 95%   BMI 28.55 kg/m  Wt Readings from Last 3 Encounters:  01/11/20 187 lb 12.8 oz (85.2 kg)  01/06/19 181 lb (82.1 kg)  11/14/18 181 lb (82.1 kg)     Lab Results  Component Value Date   WBC 3.7 (L) 12/27/2017   HGB 16.9 12/27/2017   HCT 48.1 12/27/2017   PLT 136.0 (L) 12/27/2017   GLUCOSE 99 01/06/2019   CHOL 212 (H) 01/06/2019   TRIG 177.0 (H) 01/06/2019   HDL 36.90 (L) 01/06/2019   LDLDIRECT 86.0 06/17/2014   LDLCALC 140 (H) 01/06/2019   ALT 16 01/06/2019   AST 22 01/06/2019   NA 139 01/06/2019   K 3.8 01/06/2019   CL 100  01/06/2019   CREATININE 0.87 01/06/2019   BUN 13 01/06/2019   CO2 33 (H) 01/06/2019   TSH 1.66 08/11/2007   PSA 2.49 01/06/2019   INR 2.5 01/06/2020  MICROALBUR 0.7 11/15/2014    US Venous Img Lower Unilateral Left  Result Date: 08/29/2017 CLINICAL DATA:  Left lower extremity pain and edema for the past week. History of pulmonary embolism. Evaluate for DVT. EXAM: LEFT LOWER EXTREMITY VENOUS DOPPLER ULTRASOUND TECHNIQUE: Gray-scale sonography with graded compression, as well as color Doppler and duplex ultrasound were performed to evaluate the lower extremity deep venous systems from the level of the common femoral vein and including the common femoral, femoral, profunda femoral, popliteal and calf veins including the posterior tibial, peroneal and gastrocnemius veins when visible. The superficial great saphenous vein was also interrogated. Spectral Doppler was utilized to evaluate flow at rest and with distal augmentation maneuvers in the common femoral, femoral and popliteal veins. COMPARISON:  None. FINDINGS: Contralateral Common Femoral Vein: Respiratory phasicity is normal and symmetric with the symptomatic side. No evidence of thrombus. Normal compressibility. Common Femoral Vein: No evidence of thrombus. Normal compressibility, respiratory phasicity and response to augmentation. Saphenofemoral Junction: No evidence of thrombus. Normal compressibility and flow on color Doppler imaging. Profunda Femoral Vein: No evidence of thrombus. Normal compressibility and flow on color Doppler imaging. Femoral Vein: No evidence of thrombus. Normal compressibility, respiratory phasicity and response to augmentation. Popliteal Vein: No evidence of thrombus. Normal compressibility, respiratory phasicity and response to augmentation. Calf Veins: No evidence of thrombus. Normal compressibility and flow on color Doppler imaging. Superficial Great Saphenous Vein: No evidence of thrombus. Normal compressibility. Venous  Reflux:  None. Other Findings:  None. IMPRESSION: No evidence of DVT within the left lower extremity. Electronically Signed   By: Sandi Mariscal M.D.   On: 08/29/2017 10:37     Assessment & Plan:  Plan  I am having Gregor Amador Cunas maintain his multivitamin, Cholecalciferol, Hyaluronic Acid-Vitamin C (HYALURONIC ACID PO), Vitamin L-37, folic acid, Omega-3 Fatty Acids (FISH OIL PO), Krill Oil, Saw Palmetto, rosuvastatin, warfarin, and warfarin.  No orders of the defined types were placed in this encounter.   Problem List Items Addressed This Visit      Unprioritized   Factor V deficiency ([place])    Lab Results  Component Value Date   INR 2.5 01/06/2020   INR 1.9 (A) 12/16/2019   INR 2.1 11/04/2019         Hyperlipidemia    Encouraged heart healthy diet, increase exercise, avoid trans fats, consider a krill oil cap daily      MEMORY LOSS    Refer to neuro psych--- pt is in agreement even though he is not so concerned  He will go due to his daughter and wife being so concerned       Relevant Orders   Ambulatory referral to Neuropsychology   TSH   Vitamin B12   Comprehensive metabolic panel   VITAMIN D 25 Hydroxy (Vit-D Deficiency, Fractures)   Preventative health care - Primary    See above      Relevant Orders   Lipid panel   CBC with Differential/Platelet   TSH   Vitamin B12   Comprehensive metabolic panel    Other Visit Diagnoses    Nocturia       Relevant Orders   PSA      Follow-up: Return in about 6 months (around 07/11/2020), or if symptoms worsen or fail to improve, for hypertension, hyperlipidemia.  Ann Held, DO

## 2020-01-11 NOTE — Telephone Encounter (Signed)
Pt notified of results. Pt verbalized understanding

## 2020-01-11 NOTE — Patient Instructions (Addendum)
Selenium --- may be a cheaper alternative for memory  Voltaren gel-- for knees if needed----over the counter     Preventive Care 65 Years and Older, Male Preventive care refers to lifestyle choices and visits with your health care provider that can promote health and wellness. This includes:  A yearly physical exam. This is also called an annual well check.  Regular dental and eye exams.  Immunizations.  Screening for certain conditions.  Healthy lifestyle choices, such as diet and exercise. What can I expect for my preventive care visit? Physical exam Your health care provider will check:  Height and weight. These may be used to calculate body mass index (BMI), which is a measurement that tells if you are at a healthy weight.  Heart rate and blood pressure.  Your skin for abnormal spots. Counseling Your health care provider may ask you questions about:  Alcohol, tobacco, and drug use.  Emotional well-being.  Home and relationship well-being.  Sexual activity.  Eating habits.  History of falls.  Memory and ability to understand (cognition).  Work and work Statistician. What immunizations do I need?  Influenza (flu) vaccine  This is recommended every year. Tetanus, diphtheria, and pertussis (Tdap) vaccine  You may need a Td booster every 10 years. Varicella (chickenpox) vaccine  You may need this vaccine if you have not already been vaccinated. Zoster (shingles) vaccine  You may need this after age 51. Pneumococcal conjugate (PCV13) vaccine  One dose is recommended after age 54. Pneumococcal polysaccharide (PPSV23) vaccine  One dose is recommended after age 24. Measles, mumps, and rubella (MMR) vaccine  You may need at least one dose of MMR if you were born in 1957 or later. You may also need a second dose. Meningococcal conjugate (MenACWY) vaccine  You may need this if you have certain conditions. Hepatitis A vaccine  You may need this if you  have certain conditions or if you travel or work in places where you may be exposed to hepatitis A. Hepatitis B vaccine  You may need this if you have certain conditions or if you travel or work in places where you may be exposed to hepatitis B. Haemophilus influenzae type b (Hib) vaccine  You may need this if you have certain conditions. You may receive vaccines as individual doses or as more than one vaccine together in one shot (combination vaccines). Talk with your health care provider about the risks and benefits of combination vaccines. What tests do I need? Blood tests  Lipid and cholesterol levels. These may be checked every 5 years, or more frequently depending on your overall health.  Hepatitis C test.  Hepatitis B test. Screening  Lung cancer screening. You may have this screening every year starting at age 95 if you have a 30-pack-year history of smoking and currently smoke or have quit within the past 15 years.  Colorectal cancer screening. All adults should have this screening starting at age 30 and continuing until age 50. Your health care provider may recommend screening at age 64 if you are at increased risk. You will have tests every 1-10 years, depending on your results and the type of screening test.  Prostate cancer screening. Recommendations will vary depending on your family history and other risks.  Diabetes screening. This is done by checking your blood sugar (glucose) after you have not eaten for a while (fasting). You may have this done every 1-3 years.  Abdominal aortic aneurysm (AAA) screening. You may need this if you  are a current or former smoker.  Sexually transmitted disease (STD) testing. Follow these instructions at home: Eating and drinking  Eat a diet that includes fresh fruits and vegetables, whole grains, lean protein, and low-fat dairy products. Limit your intake of foods with high amounts of sugar, saturated fats, and salt.  Take vitamin and  mineral supplements as recommended by your health care provider.  Do not drink alcohol if your health care provider tells you not to drink.  If you drink alcohol: ? Limit how much you have to 0-2 drinks a day. ? Be aware of how much alcohol is in your drink. In the [place] one drink equals one 12 oz bottle of beer (355 mL), one 5 oz glass of wine (148 mL), or one 1 oz glass of hard liquor (44 mL). Lifestyle  Take daily care of your teeth and gums.  Stay active. Exercise for at least 30 minutes on 5 or more days each week.  Do not use any products that contain nicotine or tobacco, such as cigarettes, e-cigarettes, and chewing tobacco. If you need help quitting, ask your health care provider.  If you are sexually active, practice safe sex. Use a condom or other form of protection to prevent STIs (sexually transmitted infections).  Talk with your health care provider about taking a low-dose aspirin or statin. What's next?  Visit your health care provider once a year for a well check visit.  Ask your health care provider how often you should have your eyes and teeth checked.  Stay up to date on all vaccines. This information is not intended to replace advice given to you by your health care provider. Make sure you discuss any questions you have with your health care provider. Document Revised: 01/16/2018 Document Reviewed: 01/16/2018 Elsevier Patient Education  2020 [employer].

## 2020-01-11 NOTE — Assessment & Plan Note (Signed)
Encouraged heart healthy diet, increase exercise, avoid trans fats, consider a krill oil cap daily 

## 2020-01-11 NOTE — Assessment & Plan Note (Signed)
Lab Results  Component Value Date   INR 2.5 01/06/2020   INR 1.9 (A) 12/16/2019   INR 2.1 11/04/2019

## 2020-01-11 NOTE — Assessment & Plan Note (Signed)
Refer to neuro psych--- pt is in agreement even though he is not so concerned  He will go due to his daughter and wife being so concerned

## 2020-01-11 NOTE — Assessment & Plan Note (Signed)
See above

## 2020-01-11 NOTE — Telephone Encounter (Signed)
Please call pt and tell him to stop any vita d supplements

## 2020-01-11 NOTE — Telephone Encounter (Signed)
CRITICAL VALUE STICKER  CRITICAL VALUE:  Vit D  121.88  RECEIVER (on-site recipient of call): Kelle Darting, [place] NOTIFIED: 01/11/20 @ 2:30pm  MESSENGER (representative from lab):Kenney Houseman  MD NOTIFIED: Carollee Herter   TIME OF NOTIFICATION: 2:30pm  RESPONSE:

## 2020-01-12 ENCOUNTER — Other Ambulatory Visit: Payer: Self-pay | Admitting: Family Medicine

## 2020-01-12 DIAGNOSIS — R7989 Other specified abnormal findings of blood chemistry: Secondary | ICD-10-CM

## 2020-01-12 DIAGNOSIS — E785 Hyperlipidemia, unspecified: Secondary | ICD-10-CM

## 2020-01-12 HISTORY — DX: Other specified abnormal findings of blood chemistry: R79.89

## 2020-01-13 ENCOUNTER — Other Ambulatory Visit: Payer: Self-pay

## 2020-01-15 ENCOUNTER — Other Ambulatory Visit: Payer: Self-pay

## 2020-01-15 DIAGNOSIS — E785 Hyperlipidemia, unspecified: Secondary | ICD-10-CM

## 2020-01-21 ENCOUNTER — Encounter: Payer: Self-pay | Admitting: Counselor

## 2020-02-09 ENCOUNTER — Other Ambulatory Visit: Payer: Self-pay

## 2020-02-10 ENCOUNTER — Other Ambulatory Visit: Payer: Self-pay

## 2020-02-10 ENCOUNTER — Ambulatory Visit: Payer: Medicare HMO

## 2020-02-10 DIAGNOSIS — Z7901 Long term (current) use of anticoagulants: Secondary | ICD-10-CM

## 2020-02-10 LAB — POCT INR: INR: 2.1 (ref 2.0–3.0)

## 2020-02-10 NOTE — Progress Notes (Unsigned)
Pt here for INR check per Dr. Laury Axon  Goal INR =2.0-3.0  Last INR = 2.5  Pt currently takes Coumadin  Pt currently taking Coumadin 5 mg 4 days and 7.5 mg 3 days all other days  Pt denies recent antibiotics, no dietary changes and no unusual bruising / bleeding.  INR today = 2.1  Pt advised per  Drue Novel MD(D.OD) advised no change to regimen and to return in 4 weeks. Pt will call to schedule appointment.

## 2020-02-29 ENCOUNTER — Ambulatory Visit (INDEPENDENT_AMBULATORY_CARE_PROVIDER_SITE_OTHER): Payer: Medicare HMO | Admitting: Counselor

## 2020-02-29 ENCOUNTER — Encounter: Payer: Self-pay | Admitting: Counselor

## 2020-02-29 ENCOUNTER — Other Ambulatory Visit: Payer: Self-pay

## 2020-02-29 DIAGNOSIS — F09 Unspecified mental disorder due to known physiological condition: Secondary | ICD-10-CM

## 2020-02-29 DIAGNOSIS — I6781 Acute cerebrovascular insufficiency: Secondary | ICD-10-CM

## 2020-02-29 DIAGNOSIS — R252 Cramp and spasm: Secondary | ICD-10-CM | POA: Diagnosis not present

## 2020-02-29 DIAGNOSIS — R69 Illness, unspecified: Secondary | ICD-10-CM | POA: Diagnosis not present

## 2020-02-29 NOTE — Progress Notes (Signed)
[facility]  Patient Name: Shawn [name] MRN: [id number] Date of Birth: [birth year] Age: 86 y.o. Education: 12 years  Referral Circumstances and Background Information  Shawn [name] is a 86 y.o., right-hand dominant, married man with a history of factor V leiden, PE, DVT, HLD, and memory and thinking problems. He was referred by his PCP Dr. Carollee Herter for evaluation. Review of the referring provider's notes indicates that the patient is not particularly concerned about his problems but his family have noticed "a big change."    On interview, the patient doesn't think he has much in the way of memory and thinking problems although he does notice some difficulties with word retrieval. He isn't sure when they started. His wife has noticed changes with respect to word retrieval in addition to other things, over the past several years. He has difficulty understanding things, such as instructions, he gets flustered easily, he seems indecisive, and he takes longer to think about things when playing cards. She thinks his issues have been worsening over time, particularly over the past year and a half. He was taking Prevagen and has stopped since January, they thought it was helping but haven't noticed any changes since he stopped. On specific review of symptoms, they denied any problems with understanding the meaning of a single word, with slow effortful speech, with making speech sounds, or with agrammatism. He does have problems with memory, he will repeat himself or ask the same questions, but it is not profound and typically occurs over the course of hours to days. They notice no major personality changes. They notice no delusions or hallucinations. With respect to movement changes, he appears to have some myoclonus type movements (preferentially affecting the right arm and the shoulder muscles on that side) and he says he has always  had them but his wife say's it is new. These would come and go throughout the encounter and were most notable when he appeared more anxious, when starting testing. He has no tremors. He has knee problems and drags his left foot sometimes, although his wife thinks it is due to joint issues. He has no falls. With respect to mood, the patient stated that he gets testy, although he wasn't able to describe his mood much further. He denied tearfulness or frank depression. He says his energy is "pretty good," and he is sleeping well, usually 8 hours. He is not acting out his dreams. His appetite is good, his wife tells him he eats too much chocolate, but he hasn't been gaining significant weight.   With respect to functioning, they denied that there is anything he used to do that he cannot do now. He is still driving and denied getting lost, accidents, or near misses. They comanage finances, he is not making errors and does that at his usual level of skill. He is not on many medications (warfarin) and he takes the medication as prescribed and manages it himself. He has some complex hobbies such as reading and using the computer for computer games and he has no issues with those things. He doesn't cook. They are denying that he has any changes with respect to community functioning although he doesn't do a lot, he will drop his wife off at the grocery store and they go out to eat. His wife thinks that he is still making sound decisions, although it takes longer than it used to and he needs more input than in the  past. There are no changes in his attentiveness to hygiene or self-care.   Past Medical History and Review of Relevant Studies   Patient Active Problem List   Diagnosis Date Noted  . High serum vitamin D 01/12/2020  . Pain of left thumb 02/20/2016  . Preventative health care 11/13/2013  . History of colon polyps 11/02/2011  . Bowel habit changes 11/02/2011  . Other and unspecified coagulation defects  11/02/2011  . Overweight(278.02) 10/05/2010  . BACK PAIN 04/21/2010  . COLONIC POLYPS, ADENOMATOUS, HX OF 03/22/2010  . Factor V deficiency ([place]) 12/01/2009  . THROMBOCYTOPENIA 12/01/2009  . PULMONARY EMBOLISM, HX OF 12/01/2009  . Pulmonary embolism ([place]) 11/20/2009  . CERUMEN IMPACTION, RIGHT 07/29/2009  . VARICOSE VEINS LOWER EXTREMITIES W/INFLAMMATION 12/01/2008  . DVT 09/03/2008  . Aortic valve disorder 11/19/2007  . HEPATIC CYST 11/19/2007  . CALF PAIN, LEFT 11/11/2007  . MEMORY LOSS 08/28/2007  . MOLE 08/11/2007  . Hyperlipidemia 09/04/2006  . OSTEOARTHRITIS 09/04/2006  . 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 This is enough burden to contribute to if not cause a dementia level of function, in my opinion, and likely represents sequelae of small vessel ischemic changes. Otherwise, there is a mild burden of volume loss, in a nonspecific distribution, not clearly in excess of involutional changes.   They are denying any history of strokes, seizures, neurological surgery, or head injuries requiring medical treatment. He did get sunstroke and is worried that may be contributing, because the doctor said that can affect the brain, although this was when he was 86 years of age.   Current Outpatient Medications  Medication Sig Dispense Refill  . Cholecalciferol ([employer] VITAMIN D3) 1000 UNITS capsule Take 2,000 Units by mouth daily.     . Cyanocobalamin (VITAMIN B-12) 5000 MCG TBDP Take 1 tablet by mouth daily.    . folic acid (FOLVITE) 540 MCG tablet Take 400 mcg by mouth daily.    Marland Kitchen Hyaluronic Acid-Vitamin C (HYALURONIC ACID PO) Take 2 tablets by mouth daily. (200 mg)    . Krill Oil 1000 MG CAPS Take 1 capsule by mouth daily. (Patient not taking: Reported on 01/11/2020)    . Multiple Vitamin (MULTIVITAMIN)  tablet Take 1 tablet by mouth daily.      . Omega-3 Fatty Acids (FISH OIL PO) Take 2,500 mg by mouth daily.    . Saw Palmetto 450-15 MG CAPS Take 1 capsule by mouth daily.    Marland Kitchen warfarin (COUMADIN) 5 MG tablet TAKE 1 TABLET BY MOUTH 4 DAYS A WEEK 48 tablet 1  . warfarin (COUMADIN) 7.5 MG tablet TAKE AS DIRECTED BY COUMADIN CLINIC 90 tablet 0   No current facility-administered medications for this visit.   Family History  Problem Relation Age of Onset  . Lung cancer Father   . Heart attack Mother 5  . Heart disease Mother        chf  . Arthritis Mother   . Colon cancer Neg Hx   . Esophageal cancer Neg Hx   . Rectal cancer Neg Hx   . Stomach cancer Neg Hx    There is no  family history of dementia. His mother was having "some issues" when she died at 84. There is a family history of psychiatric illness. His brother is a [place] war veteran and was "never the same" after that,  they think he has PTSD.  Psychosocial History  Developmental, Educational and Employment History: The patient reported that he was an adequate student who was never held back, didn't have any learning difficulties, and didn't have any difficulties in particular subjects. He joined the [employer] and served for 6 years, although he was never involved in combat. For work, he has done a number of things, although his main occupation was in [employer]. He was in [facility] and then they relocated down here and he worked at [employer]. He retired at 89. They have lived in the area for 65 - 35  years.   Psychiatric History: Denied  Substance Use History: The patient doesn't regularly consume alcohol, he doesn't use tobacco products, he doesn't use any illicit substances.   Relationship History and Living Cimcumstances: The patient and his wife have been married for 30 years, they have four children and four grandchildren. His children have noticed his memory problems and are concerned.   Mental Status and  Behavioral Observations  Sensorium/Arousal: The patient's level of arousal was awake and alert. Hearing and vision were adequate for testing purposes. Orientation: The patient was oriented to person, to place as "[place]" (did not know practice name or healthcare system name, and generally to time (off on specific date).  Appearance: Dressed in appropriate casual clothing Behavior: The patient was pleasant and cooperative with testing, appeared to be well engaged Speech/language: Speech had a halting quality. It's not clear he was having word-finding problems per se, he would speak in a choppy fashion as though he was formulating thoughts or thinking of how to phrase something. There were semantic paraphasias in response to test stimuli. No phonologic paraphasia.  Gait/Posture: Reasonable base and station, stride length perhaps mildly reduced, possible diminished armswing on left but still presented.  Movement: The patient had myoclonic like movements affecting the shoulders, which would come and go, and seemed to increase with emotional arousal such as during challenging tests. No clear tremor noted. No significant bradykinesia on observation.  Social Comportment: Appropriate Mood: "I get testy" Affect: Mainly neutral, somewhat poker faced Thought process/content: Thought process was logical, linear, and goal-directed. Thought content was appropriate although he often deferred to his wife regarding history. No delusions.  Safety: No thoughts of harming self or others on direct questioning Insight: Shawn [name] Cognitive Assessment  02/29/2020  Visuospatial/ Executive (0/5) 1  Naming (0/3) 3  Attention: Read list of digits (0/2) 1  Attention: Read list of letters (0/1) 1  Attention: Serial 7 subtraction starting at 100 (0/3) 1  Language: Repeat phrase (0/2) 1  Language : Fluency (0/1) 0  Abstraction (0/2) 1  Delayed Recall (0/5) 0  Orientation (0/6) 4  Total 13  Adjusted Score (based  on education) 14   Test Procedures  Wide Range Achievement Test - 4             Word Reading Neuropsychological Assessment Battery  List Learning  Story Learning  Daily Living Memory  Naming  Digit Span NACC UDS-3  Sentence Repetition  [employer] Test (Short Form) Repeatable Battery for the Assessment of Neuropsychological Status (Form A)  Figure Copy  Judgment of Line Orientation  Coding  Figure Recall The Dot Counting Test A Random Letter Test Controlled Oral Word Association (F-A-S) Semantic Fluency (Animals) Trail Making Test A & B Complex Ideational Material Modified [place] Card Sorting Test Geriatric Depression Scale - Short Form Quick Dementia Rating System (completed by wife, Remo Lipps)  Plan  Shawn [name] was seen for a psychiatric diagnostic evaluation and neuropsychological testing. He is a pleasant, 86 year old, right-hand dominant many with a history of memory and thinking problems over the past 3-4 years that have been worsening over time. His wife is more concerned than he is. He also has some myoclonic type movements, affecting the bilateral shoulders (L>R). He says that is longstanding but his wife says it is new. Full and complete note with impressions, recommendations, and interpretation of test data to follow.   Viviano Simas Nicole Kindred, PsyD, [place] Clinical Neuropsychologist  Informed Consent and Coding/Compliance  Risks and benefits of the evaluation were discussed with the patient prior to all testing procedures. I conducted a clinical interview and neuropsychological testing (at least two tests) with Shawn [name]. The patient was able to tolerate the testing procedures and the patient (and/or family if applicable) is likely to benefit from further follow up to receive the diagnosis and treatment recommendations, which will be rendered at the next encounter. Billing below reflects technician time, my direct face-to-face time with  the patient, time spent in test administration, and time spent in professional activities including but not limited to: neuropsychological test interpretation, integration of neuropsychological test data with clinical history, report preparation, treatment planning, care coordination, and review of diagnostically pertinent medical history or studies.   Services associated with this encounter: Clinical Interview [phone]) plus 60 minutes (36144; Neuropsychological Evaluation by Professional)  125 minutes (31540; Neuropsychological Evaluation by Professional, Adl.) 30 minutes (08676; Test Administration by Professional) 140 minutes ([id number]; Neuropsychological Testing by Technician)

## 2020-03-01 NOTE — Progress Notes (Signed)
[facility]  Patient Name: Shawn [name] MRN: [id number] Date of Birth: [birth year] Age: 86 y.o. Education: 12 years  Measurement properties of test scores: IQ, Index, and Standard Scores (SS): Mean = 100; Standard Deviation = 15 Scaled Scores (Ss): Mean = 10; Standard Deviation = 3 Z scores (Z): Mean = 0; Standard Deviation = 1 T scores (T); Mean = 50; Standard Deviation = 10  TEST SCORES:    Note: This summary of test scores accompanies the interpretive report and should not be interpreted by unqualified individuals or in isolation without reference to the report. Test scores are relative to age, gender, and educational history as available and appropriate.   Performance Validity        "A" Random Letter Test Raw  Descriptor      Errors 0 Within Expectation  The Dot Counting Test: 18 Within Expectation  NAB Effort Index 0 Within Expectation      Mental Status Screening     Total Score Descriptor  MoCA 14 Moderate Dementia      Expected Functioning        Wide Range Achievement Test: Standard/Scaled Score Percentile      Word Reading 93 32      Attention/Processing Speed        Neuropsychological Assessment Battery (Attention Module, Form 1): T-score Percentile      Digits Forward 47 38      Digits Backwards 33 5      Repeatable Battery for the Assessment of Neuropsychological Status (Form A): Scaled Score Percentile      Coding 1 <1      Language        Neuropsychological Assessment Battery (Language Module, Form 1): T-score Percentile      Naming   (28) 47 66      NACC UDS-3 T-score Percentile      Sentence Repetition 45 31      [place]western Anagrams Test 24 <1      Verbal Fluency: T-score Percentile      Controlled Oral Word Association (F-A-S) 32 4      Semantic Fluency (Animals) 20 <1      Memory:        Neuropsychological Assessment Battery (Memory Module, Form 1): T-score Percentile      List  Learning           List A Immediate Recall   (2, 4, 4) 33 5         List B Immediate Recall   (1) 36 8         List A Short Delayed Recall   (1) 33 5         List A Long Delayed Recall   (1) 37 9         List A Percent Retention   (100 %) --- 62         List A Long Delayed Yes/No Recognition Hits   (7) --- 1         List A Long Delayed Yes/No Recognition False Alarms   (2) --- 79         List A Recognition Discriminability Index --- 42      Story Learning           Immediate Recall   (13, 17) 34 5         Delayed Recall   (0) 36 8         Percent  Retention   (0 %) --- <1      Daily Living Memory            Immediate Recall   (14, 8) 35 7          Delayed Recall   (3, 0) 26 1          Percent Retention (27 %) --- <1          Recognition Hits    (6) --- 8      Repeatable Battery for the Assessment of Neuropsychological Status (Form A): Scaled Score Percentile         Figure Recall   (3) 4 2      Visuospatial/Constructional Functioning        Repeatable Battery for the Assessment of Neuropsychological Status (Form A): Standard/Scaled Score Percentile     Visuospatial/Constructional Index 64 1         Figure Copy   (9) 1 <1         Judgment of Line Orientation   (14) --- 17-25      Executive Functioning        Modified [employer] Test (MWCST): Standard/T-Score Percentile      Number of Categories Correct 23 <1      Number of Perseverative Errors 39 14      Number of Total Errors 35 7      Percent Perseverative Errors 48 42  Executive Function Composite 69 2      Trail Making Test: T-Score Percentile      Part A 40 16      Part B 25 1      [place] Diagnostic Aphasia Exam: Raw Score Scaled Score      Complex Ideational Material 9 5      Clock Drawing Raw Score Descriptor      Command 4 Moderate Impairment      Rating Scales        Clinical Dementia Rating Raw Score Descriptor      Sum of Boxes 5.0 Mild Dementia      Global Score 0.5 MCI      Quick Dementia  Rating System Raw Score Descriptor      Sum of Boxes 3.5 Very Mild Dementia      Total Score 6 Mild Dementia  Geriatric Depression Scale - Short Form 3 Negative    Sharesa Kemp V. Nicole Kindred PsyD, [place] Clinical Neuropsychologist

## 2020-03-02 NOTE — Progress Notes (Signed)
[facility]  Patient Name: Shawn [name] MRN: [id number] Date of Birth: December 18, 1934 Age: 86 y.o. Education: 59 years  Clinical Impressions  Shawn [name] is a 86 y.o., right-hand dominant, married man with a history of Factor V Leiden, PE, DVT, and HLD. His wife noticed cognitive problems involving difficulties with word retrieval, indecisiveness, and he seems flustered easily, over the past several years. He also has jerking myoclonic type movements of the BUE (R > L) over the past several years that were quite notable at times during the evaluation, especially during periods when he appeared to be anxious such as when acclimating to testing. He had borderline upper limb praxis on the left although I do not think it is frankly impaired and that is also his nondominant hand. He has an MRI of the brain from 2017 that shows a moderate burden of confluent leukoaraiosis and mild volume loss in a nonspecific distribution.   On neuropsychological testing, Shawn [name] demonstrated very significant cognitive impairment with profound visuospatial problems and additional low scores on measures of processing speed, executive function, and working memory. 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 With respect to language function, his naming, repetition, and comprehension are generally intact but I question if he may not have some subtle agrammatism given an extremely low score on extended testing with the [facility] Anagrams Test.  It is also possible that this finding simply represents general cognitive impairment as opposed to a language specific problem. Qualitatively, his speech seemed more halting than empty to me. Memory represented an area of relative strength, and while most of his scores were psychometrically low, he did retain some information across time and seemed to benefit from recognition  cuing. He screened negative for the presence of depression and was characterized as functioning at a very mild to mild dementia level by his wife. His CDR places him in the mild dementia range using the Sum of Boxes score, which is more appropriate given his atypical presentation.   Shawn [name] is thus manifesting a dementia syndrome that is likely to be neurodegenerative in nature and is currently at a mild level of progression. 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 There is a differential and atypical Alzheimer's is also a possibility (and of course corticobasal syndrome itself is frequently caused by AD pathology). Vascular disease may be contributing but does not well explain his higher cortical signs. Would recommend that he be referred to neurology for better characterization of his movement issues. Anti dementia medication could be considered pending further diagnostic clarity. While he and his wife report no problems driving, I will discuss this with them further and it should be monitored closely at a minimum, because he does have significant visuospatial impairment. He may also benefit from updated MRI of brain ideally prior to his neurology appointment to assess any atrophy or vascular progression in the interval since his prior scan  Diagnostic Impressions: Neurodegenerative dementia  Recommendations to be discussed with patient  Your presentation and performance on assessment were consistent with an impaired level of function in several areas, including on measures of visuospatial/constructional functioning, executive functioning, and working memory. You may have language problems also although you actually did not  demonstrate naming problems on testing  with me. I think that these findings in the context of your clinical history are sufficient for a diagnosis of dementia, which is most likely due to neurodegeneration.   Dementia refers to a group of syndromes where multiple areas of ability are damaged in the brain, such as memory, thinking, judgment, and behavior, and most commonly refers to age related causes of dementia that cause worsening in these abilities over time. Alzheimer's disease is the most common form of dementia in people over the age of 58. Not all dementias are Alzheimer's disease, but all Alzheimer's disease is dementia. When dementia is due to an underlying condition affecting the brain, such as Alzheimer's disease, there is progression over time, which typically proceeds gradually over many years.   In your case, I am not certain that your dementia is being caused by Alzheimer's although that is one possibility. There are a host of similar brain diseases that can cause dementia, and I am most concerned about something called corticobasal syndrome/degeneration. This condition can present with impairments of the type that you demonstrated on testing as well as jerking movements of the limbs, but I want you to see neurology to better characterize your movement problems.   There are many things that you can do that are likely to contribute to better functioning, better behavior, and more positive interactions with your loved one. Among these is establishing a routine that supports meaningful behaviors. This means planning things out, intentionally, which can relieve the burden of decision making from the demented individual, lend a sense of comfort and predictability to the day, and also make sure basic needs such as eating, hygiene, and engaging hobbies are met by incorporating these things into the routine. Establishing a routine can be challenging but after an initial period, it is often one of the  most helpful interventions. Plan your routine formally, with times and specific activities that should be completed (e.g., 8am "eat breakfast", 9:30am "morning walk", etc.). You can also plan in flex time or leisure time to avoid from making the routine overly rigid or restrictive.   Antidementia medication can be considered pending further diagnostic clarity, because your diagnosis may affect the type of medication used to treat your condition, you can follow up with neurology or Dr. Carollee Herter after that visit.   Unfortunately, it becomes unsafe for most individuals with a neurodegenerative dementia to drive at some point in the illness. While you reported that you are not having any problems, you do have impairments in cognitive abilities that have been shown to potentially affect driving and thus, I think the situation should be monitored carefully. You can also consult with your PCP about whether on the road testing is warranted.   You can return for reevaluation in 1 to 2 years, to monitor your progress over time.   Test Findings  Test scores are summarized in additional documentation associated with this encounter. Test scores are relative to age, gender, and educational history as available and appropriate. There were no concerns about performance validity as all findings fell within normal expectations.   General Intellectual Functioning/Achievement:  Performance on single word reading was toward the low end of the average range, with average presenting as a reasonable standard of comparison for this patient's cognitive test performance.   Attention and Processing Efficiency: Performance on indicators of attention and working memory was variable, with difficulties on more challenging tasks. Digit repetition forward was average. By contrast, his digit repetition backward was unusually low and he  also had 1/5 on Serial 7's from the [employer].   Performance on processing speed measures suggested  impaired abilities with extremely low timed number-symbol coding. His simple numeric sequencing was better and in the average range, although this is a very easy test.   Language: Performance on language tests showed intact naming, repetition, and comprehension although there were difficulties with verbal fluency and I question if he may not also have some subtle agrammatism that is not evident in his conversational speech. Sentence repetition and naming were both average. Comprehension is likely adequate, although he did score 3/4 on sets of yes/no questions, which could reflect executive dysfunction. He was able to understand test task instructions and questions adequately. He scored in the extremely low range on the [employer], missing mostly the more difficult items. While he seemed to have insight that his answers were wrong, he was not able to get the correct answer. It is thus possible that this represents generalized cognitive impairment as opposed to a language specific problem per se. Timed generation of words was extremely low in response to the category prompt "animals" and his fluency was unusually low in response to the letters F-A-S.   Visuospatial Function: Performance on visuospatial and constructional measures was significantly impaired with an extremely low score on the RBANS index. His figure copy was grossly distorted, inaccurate, and nearly lacking in gestalt. He performed similarly on the Necker Cube from the [employer]. Judgment of angular line orientations was better, however, falling at a low average to average range.   Learning and Memory: Performance on learning and memory measures was psychometrically low but may actually represent an area of relative strength for this patient given that he did retain some information across time and seemed to benefit from recognition cuing.   In the verbal realm, he learned 2, 4, and 4 words of a 12-item word list across three  learning trials, which is unusually low. He retained 1 word on short and long delayed recall trials. On recognition testing, his performance was good, and he demonstrated average discriminability. Memory for a short story was unusually low on immediate recall and he did not recall any information on delayed recall. Memory for brief daily living type information was unusually low on immediate recall with extremely low delayed recall. He did improve with cuing, with an unusually low score.   In the visual realm, delayed recall for a modestly complex geometric figure was extremely low.   Executive Functions: Performance on executive measures was generally impaired with an extremely low score on the Executive Function Composite of the [employer]. His perseverative errors score was low average while his categories completed score was extremely low. Alternating sequencing of numbers and letters of the alphabet could not be completed in the allotted time, generating an extremely low score. Clock drawing was consistent with "moderate impairment," with missing numbers, gross spatial distortions, and poor representation of two hands. He performed at an unusually low level when reasoning with complex verbal information. Generation of words in response to given letters was unusually low.   Rating Scale(s): Mr. Arling Cerone screened negative for the presence of depression. His wife rated him as functioning from a very mild to mild dementia range. His CDR Sum of Boxes is consistent with mild dementia, which is a better measure than the global score in his case due to his atypical presentation.   Viviano Simas Nicole Kindred PsyD, [place] Clinical Neuropsychologist

## 2020-03-07 ENCOUNTER — Encounter: Payer: Medicare HMO | Admitting: Counselor

## 2020-03-13 ENCOUNTER — Other Ambulatory Visit: Payer: Self-pay | Admitting: Family Medicine

## 2020-03-18 ENCOUNTER — Other Ambulatory Visit: Payer: Self-pay | Admitting: Family Medicine

## 2020-03-18 ENCOUNTER — Other Ambulatory Visit: Payer: Self-pay

## 2020-03-18 ENCOUNTER — Ambulatory Visit (INDEPENDENT_AMBULATORY_CARE_PROVIDER_SITE_OTHER): Payer: Medicare HMO | Admitting: Counselor

## 2020-03-18 ENCOUNTER — Encounter: Payer: Self-pay | Admitting: Counselor

## 2020-03-18 ENCOUNTER — Encounter: Payer: Self-pay | Admitting: Neurology

## 2020-03-18 DIAGNOSIS — F0390 Unspecified dementia without behavioral disturbance: Secondary | ICD-10-CM

## 2020-03-18 DIAGNOSIS — R69 Illness, unspecified: Secondary | ICD-10-CM | POA: Diagnosis not present

## 2020-03-18 DIAGNOSIS — G253 Myoclonus: Secondary | ICD-10-CM

## 2020-03-18 DIAGNOSIS — F0280 Dementia in other diseases classified elsewhere without behavioral disturbance: Secondary | ICD-10-CM

## 2020-03-18 DIAGNOSIS — R453 Demoralization and apathy: Secondary | ICD-10-CM

## 2020-03-18 HISTORY — DX: Unspecified dementia, unspecified severity, without behavioral disturbance, psychotic disturbance, mood disturbance, and anxiety: F03.90

## 2020-03-18 HISTORY — DX: Dementia in other diseases classified elsewhere, unspecified severity, without behavioral disturbance, psychotic disturbance, mood disturbance, and anxiety: F02.80

## 2020-03-18 NOTE — Patient Instructions (Signed)
Your presentation and performance on assessment were consistent with an impaired level of function in several areas, including on measures of visuospatial/constructional functioning, executive functioning, and working memory. You may have language problems also although you actually did not demonstrate naming problems on testing with me. I think that these findings in the context of your clinical history are sufficient for a diagnosis of dementia, which is most likely due to neurodegeneration.   Dementia refers to a group of syndromes where multiple areas of ability are damaged in the brain, such as memory, thinking, judgment, and behavior, and most commonly refers to age related causes of dementia that cause worsening in these abilities over time. Alzheimer's disease is the most common form of dementia in people over the age of 63. Not all dementias are Alzheimer's disease, but all Alzheimer's disease is dementia. When dementia is due to an underlying condition affecting the brain, such as Alzheimer's disease, there is progression over time, which typically proceeds gradually over many years.   In your case, I am not certain that your dementia is being caused by Alzheimer's although that is one possibility. There are a host of similar brain diseases that can cause dementia, and I am most concerned about something called corticobasal syndrome/degeneration. This condition can present with impairments of the type that you demonstrated on testing as well as jerking movements of the limbs, but I want you to see neurology to better characterize your movement problems.   There are many things that you can do that are likely to contribute to better functioning, better behavior, and more positive interactions with your loved one. Among these is establishing a routine that supports meaningful behaviors. This means planning things out, intentionally, which can relieve the burden of decision making from the demented  individual, lend a sense of comfort and predictability to the day, and also make sure basic needs such as eating, hygiene, and engaging hobbies are met by incorporating these things into the routine. Establishing a routine can be challenging but after an initial period, it is often one of the most helpful interventions. Plan your routine formally, with times and specific activities that should be completed (e.g., 8am "eat breakfast", 9:30am "morning walk", etc.). You can also plan in flex time or leisure time to avoid from making the routine overly rigid or restrictive.   Antidementia medication can be considered pending further diagnostic clarity, because your diagnosis may affect the type of medication used to treat your condition, you can follow up with neurology or Dr. Carollee Herter after that visit.   Unfortunately, it becomes unsafe for most individuals with a neurodegenerative dementia to drive at some point in the illness. While you reported that you are not having any problems, you do have impairments in cognitive abilities that have been shown to potentially affect driving and thus, I think the situation should be monitored carefully. You can also consult with your PCP about whether on the road testing is warranted.   You can return for reevaluation in 1 to 2 years, to monitor your progress over time.

## 2020-03-18 NOTE — Progress Notes (Signed)
NEUROPSYCHOLOGY FEEDBACK NOTE [facility]  Feedback Note: I met with Shawn [name] to review the findings resulting from his neuropsychological evaluation. Since the last appointment, he has been about the same. Time was spent reviewing the impressions and recommendations that are detailed in the evaluation report. 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 He may have some subtly agrammatism as well but that was less clear. He is much more lucid than his test data would suggest and I was candid with them that there is some subjectivity in terms of whether this is more an MCI or mild dementia level problem. For the sake of clarity in communication and given his cognitive test data, I think that dementia is the best term for his problem. Other topics of discussion and interventions as reflected in the patient instructions. I took time to explain the findings and answer all the patient's questions. I encouraged [name] to contact me should he have any further questions or if further follow up is desired.   Current Medications and Medical History   Current Outpatient Medications  Medication Sig Dispense Refill  . Cholecalciferol ([employer] VITAMIN D3) 1000 UNITS capsule Take 2,000 Units by mouth daily.     . Cyanocobalamin (VITAMIN B-12) 5000 MCG TBDP Take 1 tablet by mouth daily.    . folic acid (FOLVITE) 832 MCG tablet Take 400 mcg by mouth daily.    Marland Kitchen Hyaluronic Acid-Vitamin C (HYALURONIC ACID PO) Take 2 tablets by mouth daily. (200 mg)    . Krill Oil 1000 MG CAPS Take 1 capsule by mouth daily. (Patient not taking: Reported on 01/11/2020)    . Multiple Vitamin (MULTIVITAMIN) tablet Take 1 tablet by mouth daily.      . Omega-3 Fatty Acids (FISH OIL PO) Take 2,500 mg by mouth daily.    . Saw Palmetto 450-15 MG CAPS Take 1 capsule by mouth daily.     Marland Kitchen warfarin (COUMADIN) 5 MG tablet TAKE 1 TABLET BY MOUTH 4 DAYS A WEEK 48 tablet 1  . warfarin (COUMADIN) 7.5 MG tablet TAKE AS DIRECTED BY COUMADIN CLINIC 90 tablet 0   No current facility-administered medications for this visit.    Patient Active Problem List   Diagnosis Date Noted  . High serum vitamin D 01/12/2020  . Pain of left thumb 02/20/2016  . Preventative health care 11/13/2013  . History of colon polyps 11/02/2011  . Bowel habit changes 11/02/2011  . Other and unspecified coagulation defects 11/02/2011  . Overweight(278.02) 10/05/2010  . BACK PAIN 04/21/2010  . COLONIC POLYPS, ADENOMATOUS, HX OF 03/22/2010  . Factor V deficiency ([place]) 12/01/2009  . THROMBOCYTOPENIA 12/01/2009  . PULMONARY EMBOLISM, HX OF 12/01/2009  . Pulmonary embolism ([place]) 11/20/2009  . CERUMEN IMPACTION, RIGHT 07/29/2009  . VARICOSE VEINS LOWER EXTREMITIES W/INFLAMMATION 12/01/2008  . DVT 09/03/2008  . Aortic valve disorder 11/19/2007  . HEPATIC CYST 11/19/2007  . CALF PAIN, LEFT 11/11/2007  . MEMORY LOSS 08/28/2007  . MOLE 08/11/2007  . Hyperlipidemia 09/04/2006  . OSTEOARTHRITIS 09/04/2006  . DYSPHAGIA, UNSPECIFIED 09/04/2006    Mental Status and Behavioral Observations  Yvon Shawn [name] presented on time to the present encounter and was alert and generally oriented. Speech was normal in rate, rhythm, volume, and prosody. Self-reported mood was "good" and affect was somewhat flat. Thought process was logical and goal oriented and thought content was appropriate to the evaluative context. There  were no safety concerns identified at today's encounter, such as thoughts of harming self or others. Once again, myoclonic jerking type movements of the bilateral upper extremities were noted throughout the evaluation.   Plan  Feedback provided regarding the patient's neuropsychological evaluation. I was candid with them that I believe this is neurodegenerative and it will worsen over time,  but that there is a differential. Encouraged them to follow up with Dr. Carollee Herter and recommend updated MRI brain and neurology referral. Medication may be appropriate in the future once his diagnosis is refined. Nash Lucianne Lei Zettie Cooley was encouraged to contact me if any questions arise or if further follow up is desired.   Viviano Simas Nicole Kindred, PsyD, ABN Clinical Neuropsychologist  Service(s) Provided at This Encounter: 31 minutes [phone]; Conjoint therapy with patient present)

## 2020-03-23 ENCOUNTER — Other Ambulatory Visit: Payer: Self-pay

## 2020-03-23 ENCOUNTER — Ambulatory Visit (INDEPENDENT_AMBULATORY_CARE_PROVIDER_SITE_OTHER): Payer: Medicare HMO

## 2020-03-23 DIAGNOSIS — Z7901 Long term (current) use of anticoagulants: Secondary | ICD-10-CM

## 2020-03-23 LAB — POCT INR: INR: 2.2 (ref 2.0–3.0)

## 2020-03-23 NOTE — Progress Notes (Signed)
Pt here for INR check per Dr. Etter Sjogren  Goal INR =2.0-3.0  Last INR = 2.1  Pt currently takes Coumadin  Pt currently taking Coumadin 5 mg 4 days and 7.5 mg 3 days all other days  Pt denies recent antibiotics, no dietary changes and no unusual bruising / bleeding.  INR today =2.2  Pt advised perDr. Etter Sjogren he should continue his regimen and return in 6 weeks.

## 2020-03-31 ENCOUNTER — Other Ambulatory Visit: Payer: Self-pay

## 2020-03-31 ENCOUNTER — Ambulatory Visit (HOSPITAL_COMMUNITY)
Admission: RE | Admit: 2020-03-31 | Discharge: 2020-03-31 | Disposition: A | Discharge status: Home / Self Care | Payer: Medicare HMO | Source: Ambulatory Visit | Attending: Family Medicine | Admitting: Family Medicine

## 2020-03-31 DIAGNOSIS — J3489 Other specified disorders of nose and nasal sinuses: Secondary | ICD-10-CM | POA: Diagnosis not present

## 2020-03-31 DIAGNOSIS — G9389 Other specified disorders of brain: Secondary | ICD-10-CM | POA: Diagnosis not present

## 2020-03-31 DIAGNOSIS — I6782 Cerebral ischemia: Secondary | ICD-10-CM | POA: Diagnosis not present

## 2020-03-31 DIAGNOSIS — R69 Illness, unspecified: Secondary | ICD-10-CM | POA: Diagnosis not present

## 2020-03-31 DIAGNOSIS — R413 Other amnesia: Secondary | ICD-10-CM | POA: Diagnosis not present

## 2020-03-31 DIAGNOSIS — F0390 Unspecified dementia without behavioral disturbance: Secondary | ICD-10-CM | POA: Diagnosis present

## 2020-04-14 ENCOUNTER — Encounter: Payer: Self-pay | Admitting: Counselor

## 2020-04-18 DIAGNOSIS — I10 Essential (primary) hypertension: Secondary | ICD-10-CM | POA: Insufficient documentation

## 2020-04-18 NOTE — Progress Notes (Signed)
Cardiology Office Note   Date:  04/19/2020   ID:  Shawn [name], DOB [birth year], MRN [id number]  PCP:  Carollee Herter, Alferd Apa, DO  Cardiologist:   Minus Breeding, MD   No chief complaint on file.    History of Present Illness:  Shawn [name] is a 86 y.o. male will present for office visit. He has past medical history of mild aortic valve sclerosis with cardiac murmur, hyperlipidemia, factor V Leiden mutation and history of DVT/PE on coumadin.   He was seen in July last year for near syncope.  His INR was subtherapeutic.  However, MRI  demonstrated no acute changes.    He presents for follow-up.  He has had no new cardiovascular complaints since I last saw him.  He and his wife are clear working every day.  The patient denies any new symptoms such as chest discomfort, neck or arm discomfort. There has been no new shortness of breath, PND or orthopnea. There have been no reported palpitations.   In particular he has had none of the presyncope that he had previously.  Past Medical History:  Diagnosis Date  . Allergy   . Cardiac murmur    mild aortic sclerosis  . Cataract    left eye  . Clotting disorder (HCC)    Factor v Leiden factor mutation  . Colon polyps    Hyperplastic  . Diverticulosis   . Dysphagia   . Factor V Leiden mutation ([place])   . Hemorrhoids   . History of blood clots   . Hyperlipidemia   . Osteoarthritis     Past Surgical History:  Procedure Laterality Date  . APPENDECTOMY    . CATARACT EXTRACTION  02/09/2007   right eye  . INGUINAL HERNIA REPAIR    . TONSILLECTOMY    . VARICOSE VEIN SURGERY    . VASECTOMY       Current Outpatient Medications  Medication Sig Dispense Refill  . Cholecalciferol 25 MCG (1000 UT) capsule Take 2,000 Units by mouth daily.    . Cyanocobalamin (VITAMIN B-12) 5000 MCG TBDP Take 1 tablet by mouth daily.    . folic acid (FOLVITE) 809 MCG tablet Take 400 mcg by mouth daily.    . Omega-3 Fatty Acids  (FISH OIL) 1000 MG CAPS Take 4,000 mg by mouth daily.    Marland Kitchen Hyaluronic Acid-Vitamin C (HYALURONIC ACID PO) Take 2 tablets by mouth daily. (200 mg)    . Krill Oil 1000 MG CAPS Take 1 capsule by mouth daily. (Patient not taking: No sig reported)    . Multiple Vitamin (MULTIVITAMIN) tablet Take 1 tablet by mouth daily.      . Omega-3 Fatty Acids (FISH OIL PO) Take 2,500 mg by mouth daily.    . Saw Palmetto 450-15 MG CAPS Take 1 capsule by mouth daily.    Marland Kitchen warfarin (COUMADIN) 5 MG tablet TAKE 1 TABLET BY MOUTH 4 DAYS A WEEK 48 tablet 1  . warfarin (COUMADIN) 7.5 MG tablet TAKE AS DIRECTED BY COUMADIN CLINIC 90 tablet 0   No current facility-administered medications for this visit.    Allergies:   Niacin and Pravastatin    ROS:  Please see the history of present illness.   Otherwise, review of systems are positive for none.   All other systems are reviewed and negative.    PHYSICAL EXAM: VS:  BP 140/80 (BP Location: Left Arm, Patient Position: Sitting)   Pulse 78   Ht 5\' 6"  (  1.676 m)   Wt 187 lb (84.8 kg)   BMI 30.18 kg/m  , BMI Body mass index is 30.18 kg/m.  GENERAL:  Well appearing NECK:  No jugular venous distention, waveform within normal limits, carotid upstroke brisk and symmetric, no bruits, no thyromegaly LUNGS:  Clear to auscultation bilaterally CHEST:  Unremarkable HEART:  PMI not displaced or sustained,S1 and S2 within normal limits, no S3, no S4, no clicks, no rubs, 2 out of 6 mid peaking apical systolic murmur slightly radiating at aortic outflow tract, no diastolic murmurs ABD:  Flat, positive bowel sounds normal in frequency in pitch, no bruits, no rebound, no guarding, no midline pulsatile mass, no hepatomegaly, no splenomegaly EXT:  2 plus pulses throughout, no edema, no cyanosis no clubbing   EKG:  EKG is  ordered today. Sinus rhythm, rate 78, axis within normal limits, intervals within normal limits, no acute ST-T wave changes.  Recent Labs: 01/11/2020: ALT 20;  BUN 14; Creatinine, Ser 0.95; Hemoglobin 16.9; Platelets 136.0; Potassium 4.7; Sodium 141; TSH 1.73    Lipid Panel    Component Value Date/Time   CHOL 203 (H) 01/11/2020 0933   TRIG 178.0 (H) 01/11/2020 0933   HDL 35.30 (L) 01/11/2020 0933   CHOLHDL 6 01/11/2020 0933   VLDL 35.6 01/11/2020 0933   LDLCALC 132 (H) 01/11/2020 0933   LDLDIRECT 86.0 06/17/2014 1008      Wt Readings from Last 3 Encounters:  04/19/20 187 lb (84.8 kg)  01/11/20 187 lb 12.8 oz (85.2 kg)  01/06/19 181 lb (82.1 kg)      Other studies Reviewed: Additional studies/ records that were reviewed today include:  Labs Review of the above records demonstrates: See elsewhere   ASSESSMENT AND PLAN:  NEAR SYNCOPE:    He had no near-syncope or syncope.  No further work-up.  DVT/PE:    He is on chronic anticoagulation.  He tolerates warfarin and does not wish to change.  No change in therapy.   AS:   This is likely mild to moderate by exam but it has been several years so I will repeat an echocardiogram.   DYSLIPIDEMIA:   His LDL was elevated but he has previously not tolerated statins.  I do not think there is an indication for PCSK9 inhibitors in this situation.  He would prefer conservative management and we talked about the Mediterranean diet.   HTN:    His blood pressure is at target.  No change in therapy.  Current medicines are reviewed at length with the patient today.  The patient does not have concerns regarding medicines.  The following changes have been made:    None  Labs/ tests ordered today include:     Orders Placed This Encounter  Procedures  . EKG 12-Lead  . ECHOCARDIOGRAM COMPLETE     Disposition:   FU with me in 2 years.     Signed, Minus Breeding, MD  04/19/2020 2:22 PM    [facility]

## 2020-04-19 ENCOUNTER — Ambulatory Visit: Payer: Medicare HMO | Admitting: Cardiology

## 2020-04-19 ENCOUNTER — Other Ambulatory Visit: Payer: Self-pay

## 2020-04-19 ENCOUNTER — Encounter: Payer: Self-pay | Admitting: Cardiology

## 2020-04-19 VITALS — BP 140/80 | HR 78 | Ht 66.0 in | Wt 187.0 lb

## 2020-04-19 DIAGNOSIS — I359 Nonrheumatic aortic valve disorder, unspecified: Secondary | ICD-10-CM

## 2020-04-19 DIAGNOSIS — I350 Nonrheumatic aortic (valve) stenosis: Secondary | ICD-10-CM | POA: Diagnosis not present

## 2020-04-19 DIAGNOSIS — I10 Essential (primary) hypertension: Secondary | ICD-10-CM

## 2020-04-19 DIAGNOSIS — E785 Hyperlipidemia, unspecified: Secondary | ICD-10-CM | POA: Diagnosis not present

## 2020-04-19 NOTE — Patient Instructions (Signed)
Medication Instructions:  Continue current medications  *If you need a refill on your cardiac medications before your next appointment, please call your pharmacy*   Lab Work: None Ordered   Testing/Procedures: Your physician has requested that you have an echocardiogram. Echocardiography is a painless test that uses sound waves to create images of your heart. It provides your doctor with information about the size and shape of your heart and how well your heart's chambers and valves are working. This procedure takes approximately one hour. There are no restrictions for this procedure.  Follow-Up: At [facility], you and your health needs are our priority.  As part of our continuing mission to provide you with exceptional heart care, we have created designated Provider Care Teams.  These Care Teams include your primary Cardiologist (physician) and Advanced Practice Providers (APPs -  Physician Assistants and Nurse Practitioners) who all work together to provide you with the care you need, when you need it.  We recommend signing up for the patient portal called "MyChart".  Sign up information is provided on this After Visit Summary.  MyChart is used to connect with patients for Virtual Visits (Telemedicine).  Patients are able to view lab/test results, encounter notes, upcoming appointments, etc.  Non-urgent messages can be sent to your provider as well.   To learn more about what you can do with MyChart, go to [email or web address].    Your next appointment:   2 year(s)  The format for your next appointment:   In Person  Provider:   You may see Minus Breeding, MD or one of the following Advanced Practice Providers on your designated Care Team:    Rosaria Ferries, PA-C  Jory Sims, DNP, ANP

## 2020-04-22 ENCOUNTER — Other Ambulatory Visit: Payer: Self-pay

## 2020-04-22 ENCOUNTER — Encounter: Payer: Self-pay | Admitting: Neurology

## 2020-04-22 ENCOUNTER — Ambulatory Visit: Payer: Medicare HMO | Admitting: Neurology

## 2020-04-22 VITALS — BP 144/81 | HR 75 | Resp 18 | Ht 66.0 in | Wt 185.0 lb

## 2020-04-22 DIAGNOSIS — F0390 Unspecified dementia without behavioral disturbance: Secondary | ICD-10-CM | POA: Diagnosis not present

## 2020-04-22 DIAGNOSIS — R69 Illness, unspecified: Secondary | ICD-10-CM | POA: Diagnosis not present

## 2020-04-22 DIAGNOSIS — G253 Myoclonus: Secondary | ICD-10-CM | POA: Diagnosis not present

## 2020-04-22 NOTE — Progress Notes (Signed)
NEUROLOGY CONSULTATION NOTE  Shawn [name] MRN: [id number] DOB: 1934-02-13  Referring provider: Dr. Lyndal Pulley Primary care provider: Dr. Lyndal Pulley  Reason for consult:  Dementia  Dear Dr Cheri Rous:  Thank you for your kind referral of Shawn [name] for consultation of the above symptoms. Although his history is well known to you, please allow me to reiterate it for the purpose of our medical record. The patient was accompanied to the clinic by his wife who also provides collateral information. Records and images were personally reviewed where available.   HISTORY OF PRESENT ILLNESS: This is an 86 year old right-handed man with a history of hyperlipidemia, PE and DVT, Factor V Leiden deficiency, presenting for evaluation of dementia. His wife is present to provide additional information. Records were reviewed, he underwent Neuropsychological evaluation in January 2022 with very significant cognitive impairment with profound visuospatial problems and additional low scores on measures of processing speed, executive function, and working memory. 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 Atypical Alzheimer's is also a possibility. Vascular disease may be contributing but does not well explain his higher cortical signs. MRI brain in 2017 showed moderate chronic microvascular disease and mild diffuse volume loss. Repeat MRI brain in 2022 showed progression of chronic microvascular disease, no acute changes.   He feels his memory is good, "not perfect." His wife started noticing forgetfulness and word-finding difficulties a couple of years ago. His handwriting  has always been terrible, a little more illegible than before. He continues to drive and denies getting lost driving. He manages his own medications and denies missing doses. They do bills together. His wife denies any difficulties following instructions or using the remote control/microwave. He is independent with dressing and bathing. No personality changes, mood is "same as always," no paranoia or hallucinations.  They started noticing involuntary hand movements a few months ago. He has intermittent right arm myoclonus throughout the visit. They note he moves his fingers without realizing. Movements do not affect writing or using utensils. He has not noticed them in his legs. No falls. Sleep is good, no REM behavior disorder. He does fall asleep when he reads during the day. No staring/unresponsive episodes, gaps in time, olfactory/gustatory hallucinations. No change in gait, he denies any stiffness/difficulty with getting out of bed.   Laboratory Data: Lab Results  Component Value Date   TSH 1.73 01/11/2020   Lab Results  Component Value Date   VITAMINB12 >1526 (H) 01/11/2020     PAST MEDICAL HISTORY: Past Medical History:  Diagnosis Date  . Allergy   . Cardiac murmur    mild aortic sclerosis  . Cataract    left eye  . Clotting disorder (HCC)    Factor v Leiden factor mutation  . Colon polyps    Hyperplastic  . Diverticulosis   . Dysphagia   . Factor V Leiden mutation ([place])   . Hemorrhoids   . History of blood clots   . Hyperlipidemia   . Osteoarthritis     PAST SURGICAL HISTORY: Past Surgical History:  Procedure Laterality Date  . APPENDECTOMY    . CATARACT EXTRACTION  02/09/2007   right eye  . INGUINAL HERNIA REPAIR    . TONSILLECTOMY    . VARICOSE VEIN  SURGERY    . VASECTOMY      MEDICATIONS: Current Outpatient Medications on File Prior to Visit  Medication Sig Dispense Refill  . Cholecalciferol 25 MCG (1000 UT) capsule Take 2,000 Units by mouth daily.    .  Cyanocobalamin (VITAMIN B-12) 5000 MCG TBDP Take 1 tablet by mouth daily.    . folic acid (FOLVITE) 875 MCG tablet Take 400 mcg by mouth daily.    Marland Kitchen Hyaluronic Acid-Vitamin C (HYALURONIC ACID PO) Take 2 tablets by mouth daily. (200 mg)    . Krill Oil 1000 MG CAPS Take 1 capsule by mouth daily.    . Multiple Vitamin (MULTIVITAMIN) tablet Take 1 tablet by mouth daily.    . Omega-3 Fatty Acids (FISH OIL PO) Take 2,500 mg by mouth daily.    . Omega-3 Fatty Acids (FISH OIL) 1000 MG CAPS Take 4,000 mg by mouth daily.    . Saw Palmetto 450-15 MG CAPS Take 1 capsule by mouth daily.    Marland Kitchen warfarin (COUMADIN) 5 MG tablet TAKE 1 TABLET BY MOUTH 4 DAYS A WEEK 48 tablet 1  . warfarin (COUMADIN) 7.5 MG tablet TAKE AS DIRECTED BY COUMADIN CLINIC 90 tablet 0   No current facility-administered medications on file prior to visit.    ALLERGIES: Allergies  Allergen Reactions  . Niacin Rash    "My whole body turned red on the prescription dose" He takes the OTC medication  . Pravastatin Other (See Comments)    Disorientation    FAMILY HISTORY: Family History  Problem Relation Age of Onset  . Lung cancer Father   . Heart attack Mother 91  . Heart disease Mother        chf  . Arthritis Mother   . Colon cancer Neg Hx   . Esophageal cancer Neg Hx   . Rectal cancer Neg Hx   . Stomach cancer Neg Hx     SOCIAL HISTORY: Social History   Socioeconomic History  . Marital status: Married    Spouse name: Not on file  . Number of children: 4  . Years of education: Not on file  . Highest education level: Not on file  Occupational History  . 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 Smoking status: Never Smoker  . Smokeless tobacco: Never Used  Substance and Sexual Activity  . Alcohol use: No    Alcohol/week: 0.0 standard drinks  . Drug use: No  . Sexual activity: Yes    Partners: Female  Other Topics Concern  . Not on file  Social History Narrative   ** Merged  History Encounter **       ** Merged History Encounter **       Daily caffeine    Exercise-- walk 1 mile a day and uses treadmill   Lives with wife.    Right handed   One story home   Social Determinants of Health   Financial Resource Strain: Not on file  Food Insecurity: Not on file  Transportation Needs: Not on file  Physical Activity: Not on file  Stress: Not on file  Social Connections: Not on file  Intimate Partner Violence: Not on file     PHYSICAL EXAM: Vitals:   04/22/20 1028  BP: (!) 144/81  Pulse: 75  Resp: 18  SpO2: (!) 5%   General: No acute distress Head:  Normocephalic/atraumatic Skin/Extremities: No rash, no edema Neurological Exam: Mental status: alert and awake, no dysarthria or aphasia, [employer]  of knowledge is appropriate. Attention and concentration are normal.     Cranial nerves: CN I: not tested CN II: pupils equal, round and reactive to light, visual fields intact CN III, IV, VI:  full range of motion, no nystagmus, no ptosis CN V: facial sensation intact CN VII: upper and lower face symmetric CN VIII: hearing intact to conversation CN XI: sternocleidomastoid and trapezius muscles intact CN XII: tongue midline Bulk & Tone: normal, no fasciculations. There is minimal cogwheeling noted. Motor: 5/5 throughout with no pronator drift. Sensation: intact to light touch, cold, pin, vibration sense.  No extinction to double simultaneous stimulation.  Romberg test negative Deep Tendon Reflexes: +2 throughout, no ankle clonus Plantar responses: downgoing bilaterally Cerebellar: no incoordination on finger to nose testing Gait: able to rise from chair with arms over chest, gait narrow-based and steady, able to tandem walk adequately, good arm swing Tremor: none. He has intermittent right arm myoclonic jerks at rest, none with arms outstretched.    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 Atypical Alzheimer's is also a possibility. Vascular disease may be contributing but does not well explain his higher cortical signs. MRI brain in 2017 showed moderate chronic microvascular disease and mild diffuse volume loss. Repeat MRI brain in 2022 showed progression of chronic microvascular disease, no acute changes. We discussed doing an EEG. We also discussed starting Donepezil, including side effects and expectations. He would like to hold off for now. We discussed the importance of control of vascular risk factors, physical exercise, and brain stimulation exercises for brain health. Follow-up in 6-8 months, call for any changes.   Thank you for allowing me to participate in the care of this patient. Please do not hesitate to call for any questions or concerns.   Ellouise Newer, M.D.  CC: Dr. Cheri Rous

## 2020-04-22 NOTE — Patient Instructions (Signed)
1. Schedule routine EEG  2. If you would like to consider starting medication to help slow down memory loss, please call our office and we will start Donepezil/Aricept   3. Follow-up in 6-8 months, call for any changes   FALL PRECAUTIONS: Be cautious when walking. Scan the area for obstacles that may increase the risk of trips and falls. When getting up in the mornings, sit up at the edge of the bed for a few minutes before getting out of bed. Consider elevating the bed at the head end to avoid drop of blood pressure when getting up. Walk always in a well-lit room (use night lights in the walls). Avoid area rugs or power cords from appliances in the middle of the walkways. Use a walker or a cane if necessary and consider physical therapy for balance exercise. Get your eyesight checked regularly.  FINANCIAL OVERSIGHT: Supervision, especially oversight when making financial decisions or transactions is also recommended as difficulties arise.  HOME SAFETY: Consider the safety of the kitchen when operating appliances like stoves, microwave oven, and blender. Consider having supervision and share cooking responsibilities until no longer able to participate in those. Accidents with firearms and other hazards in the house should be identified and addressed as well.  DRIVING: Regarding driving, in patients with progressive memory problems, driving will be impaired. We advise to have someone else do the driving if trouble finding directions or if minor accidents are reported. Independent driving assessment is available to determine safety of driving.  ABILITY TO BE LEFT ALONE: If patient is unable to contact 911 operator, consider using LifeLine, or when the need is there, arrange for someone to stay with patients. Smoking is a fire hazard, consider supervision or cessation. Risk of wandering should be assessed by caregiver and if detected at any point, supervision and safe proof recommendations should be  instituted.  MEDICATION SUPERVISION: Inability to self-administer medication needs to be constantly addressed. Implement a mechanism to ensure safe administration of the medications.  RECOMMENDATIONS FOR ALL PATIENTS WITH MEMORY PROBLEMS: 1. Continue to exercise (Recommend 30 minutes of walking everyday, or 3 hours every week) 2. Increase social interactions - continue going to [place] and enjoy social gatherings with friends and family 3. Eat healthy, avoid fried foods and eat more fruits and vegetables 4. Maintain adequate blood pressure, blood sugar, and blood cholesterol level. Reducing the risk of stroke and cardiovascular disease also helps promoting better memory. 5. Avoid stressful situations. Live a simple life and avoid aggravations. Organize your time and prepare for the next day in anticipation. 6. Sleep well, avoid any interruptions of sleep and avoid any distractions in the bedroom that may interfere with adequate sleep quality 7. Avoid sugar, avoid sweets as there is a strong link between excessive sugar intake, diabetes, and cognitive impairment We discussed the Mediterranean diet, which has been shown to help patients reduce the risk of progressive memory disorders and reduces cardiovascular risk. This includes eating fish, eat fruits and green leafy vegetables, nuts like almonds and hazelnuts, walnuts, and also use olive oil. Avoid fast foods and fried foods as much as possible. Avoid sweets and sugar as sugar use has been linked to worsening of memory function.  There is always a concern of gradual progression of memory problems. If this is the case, then we may need to adjust level of care according to patient needs. Support, both to the patient and caregiver, should then be put into place.

## 2020-04-25 ENCOUNTER — Telehealth: Payer: Self-pay | Admitting: Neurology

## 2020-04-25 NOTE — Telephone Encounter (Signed)
Please see

## 2020-04-25 NOTE — Telephone Encounter (Signed)
Patient wife called and they would like to start the medication that Dr Delice Lesch talked about last week in the appt. They also want to make sure that there will be no interaction with the blood thinner Warfarin if he take the medication. Please call patient   They use the [facility] on [facility]

## 2020-04-26 ENCOUNTER — Other Ambulatory Visit: Payer: Self-pay | Admitting: Neurology

## 2020-04-26 MED ORDER — DONEPEZIL HCL 10 MG PO TABS: ORAL_TABLET | ORAL | 11 refills | Status: DC

## 2020-04-26 NOTE — Telephone Encounter (Signed)
The medication we had talked about is Donepezil or Aricept, main side effect can be diarrhea. No interaction with warfarin. I sent in Rx for Donepezil 10mg  take 1/2 tablet daily for 2 weeks, then increase to 1 tablet daily. Thanks

## 2020-04-27 NOTE — Telephone Encounter (Signed)
Patient advised of the above, voice understood.

## 2020-04-27 NOTE — Telephone Encounter (Signed)
No answer at 845 04/27/2020

## 2020-05-04 ENCOUNTER — Ambulatory Visit (INDEPENDENT_AMBULATORY_CARE_PROVIDER_SITE_OTHER): Payer: Medicare HMO

## 2020-05-04 ENCOUNTER — Other Ambulatory Visit: Payer: Self-pay

## 2020-05-04 DIAGNOSIS — Z7901 Long term (current) use of anticoagulants: Secondary | ICD-10-CM

## 2020-05-04 LAB — POCT INR: INR: 2.2 (ref 2.0–3.0)

## 2020-05-04 NOTE — Progress Notes (Signed)
Pt here for INR check perDr. Etter Sjogren  Goal INR =2.0-3.0  Last INR =2.1  Pt currently takes Coumadin Pt currently taking Coumadin 5 mg 4 days (Mon, Wed, Fri, and Sun) and 7.5 mg 3 days all other days (Tues, Thurs, and Saturday)  Pt denies recent antibiotics, no dietary changes and no unusual bruising / bleeding.  INR today =2.2  Pt advised perDr. Lorelei Pont he should continue his regimen and return in 4-6 weeks.

## 2020-05-09 ENCOUNTER — Ambulatory Visit (INDEPENDENT_AMBULATORY_CARE_PROVIDER_SITE_OTHER): Payer: Medicare HMO | Admitting: Neurology

## 2020-05-09 ENCOUNTER — Telehealth: Payer: Self-pay | Admitting: Neurology

## 2020-05-09 ENCOUNTER — Other Ambulatory Visit: Payer: Self-pay

## 2020-05-09 DIAGNOSIS — G253 Myoclonus: Secondary | ICD-10-CM | POA: Diagnosis not present

## 2020-05-09 DIAGNOSIS — R69 Illness, unspecified: Secondary | ICD-10-CM | POA: Diagnosis not present

## 2020-05-09 DIAGNOSIS — F0390 Unspecified dementia without behavioral disturbance: Secondary | ICD-10-CM

## 2020-05-09 NOTE — Telephone Encounter (Signed)
Since staring Donepezil the patient has had a hard time sleeping at night. Please call.   [facility]

## 2020-05-09 NOTE — Telephone Encounter (Signed)
Pls check what time he is taking the Donepezil, if he is taking at night, try taking in morning and see if this helps with sleep issues. Thanks

## 2020-05-10 ENCOUNTER — Telehealth: Payer: Self-pay

## 2020-05-10 NOTE — Telephone Encounter (Signed)
Spoke with pt wife advised that pt try taking donepezil in morning and see if this helps with sleep issues

## 2020-05-10 NOTE — Telephone Encounter (Signed)
-----   Message from Cameron Sprang, MD sent at 05/10/2020  2:49 PM EDT ----- Pls let patient/wife know the brain wave test was normal. Thanks

## 2020-05-10 NOTE — Procedures (Signed)
ELECTROENCEPHALOGRAM REPORT  Date of Study: 05/09/2020  Patient's Name: Nunzio Banet MRN: [id number] Date of Birth: 10/02/1934  Referring Provider: Dr. Ellouise Newer  Clinical History: This is an 86 year old man with dementia, myoclonic jerks. EEG for classification  Medications: Coumadin  Technical Summary: A multichannel digital EEG recording measured by the international 10-20 system with electrodes applied with paste and impedances below 5000 ohms performed in our laboratory with EKG monitoring in an awake patient.  Hyperventilation was not performed. Photic stimulation was performed.  The digital EEG was referentially recorded, reformatted, and digitally filtered in a variety of bipolar and referential montages for optimal display.    Description: The patient is awake during the recording.  During maximal wakefulness, there is a symmetric, medium voltage 8 Hz posterior dominant rhythm that attenuates with eye opening.  The record is symmetric.Sleep was not captured. Photic stimulation did not elicit any abnormalities.  There were no epileptiform discharges or electrographic seizures seen. There were no myoclonic jerks during the study.  EKG lead was unremarkable.  Impression: This awake EEG is normal.    Clinical Correlation: A normal EEG does not exclude a clinical diagnosis of epilepsy. Typical events (myoclonic jerks) were not captured.  If further clinical questions remain, prolonged EEG may be helpful.  Clinical correlation is advised.   Ellouise Newer, M.D.

## 2020-05-10 NOTE — Telephone Encounter (Signed)
Pt wife informed that brain wave test was normal

## 2020-05-17 ENCOUNTER — Other Ambulatory Visit: Payer: Self-pay

## 2020-05-17 ENCOUNTER — Ambulatory Visit (HOSPITAL_COMMUNITY): Payer: Medicare HMO | Attending: Internal Medicine

## 2020-05-17 DIAGNOSIS — I359 Nonrheumatic aortic valve disorder, unspecified: Secondary | ICD-10-CM | POA: Diagnosis not present

## 2020-05-17 DIAGNOSIS — I350 Nonrheumatic aortic (valve) stenosis: Secondary | ICD-10-CM | POA: Diagnosis not present

## 2020-05-17 LAB — ECHOCARDIOGRAM COMPLETE
AR max vel: 1.49 cm2
AV Area VTI: 1.75 cm2
AV Area mean vel: 1.46 cm2
AV Mean grad: 17 mmHg
AV Peak grad: 30.9 mmHg
Ao pk vel: 2.78 m/s
Area-P 1/2: 2.52 cm2
P 1/2 time: 596 msec
S' Lateral: 1.7 cm

## 2020-06-09 ENCOUNTER — Other Ambulatory Visit: Payer: Self-pay | Admitting: Family Medicine

## 2020-06-15 ENCOUNTER — Other Ambulatory Visit: Payer: Self-pay

## 2020-06-15 ENCOUNTER — Ambulatory Visit (INDEPENDENT_AMBULATORY_CARE_PROVIDER_SITE_OTHER): Payer: Medicare HMO

## 2020-06-15 DIAGNOSIS — Z7901 Long term (current) use of anticoagulants: Secondary | ICD-10-CM

## 2020-06-15 LAB — POCT INR: INR: 2 (ref 2.0–3.0)

## 2020-06-15 NOTE — Progress Notes (Signed)
Pt here today for INR check.   Goal: 2.0-3.0  Last INR: 2.2  Pt currently taking Coumadin 5mg  4 days weekly (Mon, Wed, Friday, and Sunday) and 7.5mg  3 days weekly (Tues,  Thurs and Saturday  Pt denies recent abx, no dietary changes, and no unusual bleeding/bruising.  INR today: 2.0  Pt instructed to continue current regimen of Coumadin. Next INR in 4-6 weeks.   Scheduled for 07/27/20 at 10:15am

## 2020-06-23 ENCOUNTER — Ambulatory Visit: Payer: Medicare HMO | Admitting: Neurology

## 2020-07-26 NOTE — Progress Notes (Signed)
Pt here for INR check per Dr Carollee Herter  Goal INR = 2.0-3.0  Last INR = 2.2 on 06/15/2020  Pt currently takes Coumadin 5 mg 4 days (Mon, Wed, Fri, and Sun) and 7.5 mg 3 days all other days (Tues, Thurs, and Saturday)  Pt denies recent antibiotics, no dietary changes and no unusual bruising / bleeding.  INR today = 1.7  Pt advised per Dr Larose Kells to   Dr Larose Kells: DOD    Currently on 5 mg 4 times/ week and 7.5  3 times a week : 42.5 mg weekly. New dose: 5 mg 3 times a week, 7.5 mg 4 times a week: 45 mg weekly Next INR 3 weeks JP

## 2020-07-27 ENCOUNTER — Other Ambulatory Visit: Payer: Self-pay

## 2020-07-27 ENCOUNTER — Ambulatory Visit (INDEPENDENT_AMBULATORY_CARE_PROVIDER_SITE_OTHER): Payer: Medicare HMO

## 2020-07-27 DIAGNOSIS — Z7901 Long term (current) use of anticoagulants: Secondary | ICD-10-CM | POA: Diagnosis not present

## 2020-07-27 LAB — POCT INR: INR: 1.7 — AB (ref 2.0–3.0)

## 2020-08-17 ENCOUNTER — Ambulatory Visit: Payer: Medicare HMO

## 2020-08-24 ENCOUNTER — Ambulatory Visit (INDEPENDENT_AMBULATORY_CARE_PROVIDER_SITE_OTHER): Payer: Medicare HMO | Admitting: *Deleted

## 2020-08-24 ENCOUNTER — Other Ambulatory Visit: Payer: Self-pay

## 2020-08-24 DIAGNOSIS — Z7901 Long term (current) use of anticoagulants: Secondary | ICD-10-CM

## 2020-08-24 LAB — POCT INR: INR: 2 (ref 2.0–3.0)

## 2020-08-24 NOTE — Progress Notes (Signed)
Pt here for INR check per Dr Carollee Herter   Goal INR = 2.0-3.0   Last INR = 1.7 on 07/27/20   Pt currently takes Coumadin 5 mg 3 times a week, 7.5 mg 4 times a week  Pt denies recent antibiotics, no dietary changes and no unusual bruising / bleeding.   INR today = 2.0  Patient to continue same regimen and return in 4 weeks.  Patient usually likes to come back in 6 weeks.  Appointment scheduled to come back when Dr. Etter Sjogren is here.

## 2020-09-04 ENCOUNTER — Other Ambulatory Visit: Payer: Self-pay | Admitting: Family Medicine

## 2020-09-04 DIAGNOSIS — I2699 Other pulmonary embolism without acute cor pulmonale: Secondary | ICD-10-CM

## 2020-10-04 ENCOUNTER — Other Ambulatory Visit: Payer: Self-pay

## 2020-10-04 ENCOUNTER — Ambulatory Visit (INDEPENDENT_AMBULATORY_CARE_PROVIDER_SITE_OTHER): Payer: Medicare HMO

## 2020-10-04 DIAGNOSIS — Z7901 Long term (current) use of anticoagulants: Secondary | ICD-10-CM | POA: Diagnosis not present

## 2020-10-04 LAB — POCT INR: INR: 2.3 (ref 2.0–3.0)

## 2020-10-04 NOTE — Progress Notes (Signed)
Pt here for INR check per Dr. Etter Sjogren   Goal INR =2.0-3.0  Last INR =2.0  Pt currently takes Coumadin Coumadin 5 mg 3 times a week, 7.5 mg 4 times a week    Pt denies recent antibiotics, no dietary changes and no unusual bruising / bleeding.  INR today = 2.3  Pt advised per continue current regimen. Follow up INR check has been scheduled.

## 2020-10-18 ENCOUNTER — Ambulatory Visit (INDEPENDENT_AMBULATORY_CARE_PROVIDER_SITE_OTHER): Payer: Medicare HMO

## 2020-10-18 VITALS — Ht 68.0 in | Wt 172.0 lb

## 2020-10-18 DIAGNOSIS — Z0000 Encounter for general adult medical examination without abnormal findings: Secondary | ICD-10-CM

## 2020-10-18 NOTE — Patient Instructions (Signed)
[name] , Thank you for taking time to complete your Medicare Wellness Visit. I appreciate your ongoing commitment to your health goals. Please review the following plan we discussed and let me know if I can assist you in the future.   Screening recommendations/referrals: Colonoscopy: No longer required Recommended yearly ophthalmology/optometry visit for glaucoma screening and checkup Recommended yearly dental visit for hygiene and checkup  Vaccinations: Influenza vaccine: Due-May obtain vaccine at our office or your local pharmacy Pneumococcal vaccine: Up to date Tdap vaccine: Up to date Shingles vaccine: Completed vaccines   Covid-19: Booster available at your local pharmacy.  Advanced directives: Please bring a copy for your chart  Conditions/risks identified: See problem list  Next appointment: Follow up in one year for your annual wellness visit.   Preventive Care 4 Years and Older, Male Preventive care refers to lifestyle choices and visits with your health care provider that can promote health and wellness. What does preventive care include? A yearly physical exam. This is also called an annual well check. Dental exams once or twice a year. Routine eye exams. Ask your health care provider how often you should have your eyes checked. Personal lifestyle choices, including: Daily care of your teeth and gums. Regular physical activity. Eating a healthy diet. Avoiding tobacco and drug use. Limiting alcohol use. Practicing safe sex. Taking low doses of aspirin every day. Taking vitamin and mineral supplements as recommended by your health care provider. What happens during an annual well check? The services and screenings done by your health care provider during your annual well check will depend on your age, overall health, lifestyle risk factors, and family history of disease. Counseling  Your health care provider may ask you questions about your: Alcohol  use. Tobacco use. Drug use. Emotional well-being. Home and relationship well-being. Sexual activity. Eating habits. History of falls. Memory and ability to understand (cognition). Work and work Statistician. Screening  You may have the following tests or measurements: Height, weight, and BMI. Blood pressure. Lipid and cholesterol levels. These may be checked every 5 years, or more frequently if you are over 48 years old. Skin check. Lung cancer screening. You may have this screening every year starting at age 85 if you have a 30-pack-year history of smoking and currently smoke or have quit within the past 15 years. Fecal occult blood test (FOBT) of the stool. You may have this test every year starting at age 48. Flexible sigmoidoscopy or colonoscopy. You may have a sigmoidoscopy every 5 years or a colonoscopy every 10 years starting at age 50. Prostate cancer screening. Recommendations will vary depending on your family history and other risks. Hepatitis C blood test. Hepatitis B blood test. Sexually transmitted disease (STD) testing. Diabetes screening. This is done by checking your blood sugar (glucose) after you have not eaten for a while (fasting). You may have this done every 1-3 years. Abdominal aortic aneurysm (AAA) screening. You may need this if you are a current or former smoker. Osteoporosis. You may be screened starting at age 25 if you are at high risk. Talk with your health care provider about your test results, treatment options, and if necessary, the need for more tests. Vaccines  Your health care provider may recommend certain vaccines, such as: Influenza vaccine. This is recommended every year. Tetanus, diphtheria, and acellular pertussis (Tdap, Td) vaccine. You may need a Td booster every 10 years. Zoster vaccine. You may need this after age 73. Pneumococcal 13-valent conjugate (PCV13) vaccine.  One dose is recommended after age 74. Pneumococcal polysaccharide  (PPSV23) vaccine. One dose is recommended after age 6. Talk to your health care provider about which screenings and vaccines you need and how often you need them. This information is not intended to replace advice given to you by your health care provider. Make sure you discuss any questions you have with your health care provider. Document Released: 02/18/2015 Document Revised: 10/12/2015 Document Reviewed: 11/23/2014 Elsevier Interactive Patient Education  2017 [employer] Prevention in the Home Falls can cause injuries. They can happen to people of all ages. There are many things you can do to make your home safe and to help prevent falls. What can I do on the outside of my home? Regularly fix the edges of walkways and driveways and fix any cracks. Remove anything that might make you trip as you walk through a door, such as a raised step or threshold. Trim any bushes or trees on the path to your home. Use bright outdoor lighting. Clear any walking paths of anything that might make someone trip, such as rocks or tools. Regularly check to see if handrails are loose or broken. Make sure that both sides of any steps have handrails. Any raised decks and porches should have guardrails on the edges. Have any leaves, snow, or ice cleared regularly. Use sand or salt on walking paths during winter. Clean up any spills in your garage right away. This includes oil or grease spills. What can I do in the bathroom? Use night lights. Install grab bars by the toilet and in the tub and shower. Do not use towel bars as grab bars. Use non-skid mats or decals in the tub or shower. If you need to sit down in the shower, use a plastic, non-slip stool. Keep the floor dry. Clean up any water that spills on the floor as soon as it happens. Remove soap buildup in the tub or shower regularly. Attach bath mats securely with double-sided non-slip rug tape. Do not have throw rugs and other things on the  floor that can make you trip. What can I do in the bedroom? Use night lights. Make sure that you have a light by your bed that is easy to reach. Do not use any sheets or blankets that are too big for your bed. They should not hang down onto the floor. Have a firm chair that has side arms. You can use this for support while you get dressed. Do not have throw rugs and other things on the floor that can make you trip. What can I do in the kitchen? Clean up any spills right away. Avoid walking on wet floors. Keep items that you use a lot in easy-to-reach places. If you need to reach something above you, use a strong step stool that has a grab bar. Keep electrical cords out of the way. Do not use floor polish or wax that makes floors slippery. If you must use wax, use non-skid floor wax. Do not have throw rugs and other things on the floor that can make you trip. What can I do with my stairs? Do not leave any items on the stairs. Make sure that there are handrails on both sides of the stairs and use them. Fix handrails that are broken or loose. Make sure that handrails are as long as the stairways. Check any carpeting to make sure that it is firmly attached to the stairs. Fix any carpet that is loose or  worn. Avoid having throw rugs at the top or bottom of the stairs. If you do have throw rugs, attach them to the floor with carpet tape. Make sure that you have a light switch at the top of the stairs and the bottom of the stairs. If you do not have them, ask someone to add them for you. What else can I do to help prevent falls? Wear shoes that: Do not have high heels. Have rubber bottoms. Are comfortable and fit you well. Are closed at the toe. Do not wear sandals. If you use a stepladder: Make sure that it is fully opened. Do not climb a closed stepladder. Make sure that both sides of the stepladder are locked into place. Ask someone to hold it for you, if possible. Clearly mark and make  sure that you can see: Any grab bars or handrails. First and last steps. Where the edge of each step is. Use tools that help you move around (mobility aids) if they are needed. These include: Canes. Walkers. Scooters. Crutches. Turn on the lights when you go into a dark area. Replace any light bulbs as soon as they burn out. Set up your furniture so you have a clear path. Avoid moving your furniture around. If any of your floors are uneven, fix them. If there are any pets around you, be aware of where they are. Review your medicines with your doctor. Some medicines can make you feel dizzy. This can increase your chance of falling. Ask your doctor what other things that you can do to help prevent falls. This information is not intended to replace advice given to you by your health care provider. Make sure you discuss any questions you have with your health care provider. Document Released: 11/18/2008 Document Revised: 06/30/2015 Document Reviewed: 02/26/2014 Elsevier Interactive Patient Education  2017 [employer].

## 2020-10-18 NOTE — Progress Notes (Signed)
Subjective:   Shawn [name] is a 86 y.o. male who presents for Medicare Annual/Subsequent preventive examination.  I connected with Jahad today by telephone and verified that I am speaking with the correct person using two identifiers. Location patient: home Location provider: work Persons participating in the virtual visit: patient, Marine scientist.    I discussed the limitations, risks, security and privacy concerns of performing an evaluation and management service by telephone and the availability of in person appointments. I also discussed with the patient that there may be a patient responsible charge related to this service. The patient expressed understanding and verbally consented to this telephonic visit.    Interactive audio and video telecommunications were attempted between this provider and patient, however failed, due to patient having technical difficulties OR patient did not have access to video capability.  We continued and completed visit with audio only.  Some vital signs may be absent or patient reported.   Time Spent with patient on telephone encounter: 20 minutes   Review of Systems     Cardiac Risk Factors include: advanced age (>58mn, >>70women);male gender;dyslipidemia;hypertension     Objective:    Today's Vitals   10/18/20 0822  Weight: 172 lb (78 kg)  Height: '5\' 8"'$  (1.727 m)   Body mass index is 26.15 kg/m.  Advanced Directives 10/18/2020 04/22/2020 12/22/2015  Does Patient Have a Medical Advance Directive? Yes Yes Yes  Type of AParamedicof [place]oll[place]Living will - [place] will  Does patient want to make changes to medical advance directive? - - No - Patient declined  Copy of [place] Chart? No - copy requested - No - copy requested    Current Medications (verified) Outpatient Encounter Medications as of 10/18/2020  Medication Sig   Cholecalciferol 25 MCG (1000 UT)  capsule Take 2,000 Units by mouth daily.   Cyanocobalamin (VITAMIN B-12) 5000 MCG TBDP Take 1 tablet by mouth daily.   donepezil (ARICEPT) 10 MG tablet Take 1/2 tablet daily for 2 weeks, then increase to 1 tablet daily   folic acid (FOLVITE) [id number] tablet Take 400 mcg by mouth daily.   Hyaluronic Acid-Vitamin C (HYALURONIC ACID PO) Take 2 tablets by mouth daily. (200 mg)   Krill Oil 1000 MG CAPS Take 1 capsule by mouth daily.   Multiple Vitamin (MULTIVITAMIN) tablet Take 1 tablet by mouth daily.   Omega-3 Fatty Acids (FISH OIL PO) Take 2,500 mg by mouth daily.   Omega-3 Fatty Acids (FISH OIL) 1000 MG CAPS Take 4,000 mg by mouth daily.   Saw Palmetto 450-15 MG CAPS Take 1 capsule by mouth daily.   warfarin (COUMADIN) 5 MG tablet TAKE 1 TABLET BY MOUTH 4 DAYS A WEEK   warfarin (COUMADIN) 7.5 MG tablet TAKE AS DIRECTED BY COUMADIN CLINIC   No facility-administered encounter medications on file as of 10/18/2020.    Allergies (verified) Niacin and Pravastatin   History: Past Medical History:  Diagnosis Date   Allergy    Cardiac murmur    mild aortic sclerosis   Cataract    left eye   Clotting disorder (HCC)    Factor v Leiden factor mutation   Colon polyps    Hyperplastic   Diverticulosis    Dysphagia    Factor V Leiden mutation ([place]    Hemorrhoids    History of blood clots    Hyperlipidemia    Osteoarthritis    Past Surgical History:  Procedure Laterality  Date   APPENDECTOMY     CATARACT EXTRACTION  02/09/2007   right eye   INGUINAL HERNIA REPAIR     TONSILLECTOMY     VARICOSE VEIN SURGERY     VASECTOMY     Family History  Problem Relation Age of Onset   Lung cancer Father    Heart attack Mother 28   Heart disease Mother        chf   Arthritis Mother    Colon cancer Neg Hx    Esophageal cancer Neg Hx    Rectal cancer Neg Hx    Stomach cancer Neg Hx    Social History   Socioeconomic History   Marital status: Married    Spouse name: Not on file   Number of  children: 4   Years of education: Not on file   Highest education level: Not on file  Occupational History   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    Right handed   One story home   Social Determinants of Health   Financial Resource Strain: Low Risk    Difficulty of Paying Living Expenses: Not hard at all  Food Insecurity: No Food Insecurity   Worried About Charity fundraiser in the Last Year: Never true   Ran Out of Food in the Last Year: Never true  Transportation Needs: No Transportation Needs   Lack of Transportation (Medical): No   Lack of Transportation (Non-Medical): No  Physical Activity: Sufficiently Active   Days of Exercise per Week: 6 days   Minutes of Exercise per Session: 30 min  Stress: No Stress Concern Present   Feeling of Stress : Not at all  Social Connections: Socially Isolated   Frequency of Communication with Friends and Family: Once a week   Frequency of Social Gatherings with Friends and Family: Once a week   Attends Religious Services: Never   Marine scientist or Organizations: No   Attends Music therapist: Never   Marital Status: Married    Tobacco Counseling Counseling given: Not Answered   Clinical Intake:  Pre-visit preparation completed: Yes  Pain : No/denies pain     BMI - recorded: 26.15 Nutritional Status: BMI 25 -29 Overweight Nutritional Risks: None Diabetes: No  How often do you need to have someone help you when you read instructions, pamphlets, or other  written materials from your doctor or pharmacy?: 1 - Never  Diabetic?No  Interpreter Needed?: No  Information entered by :: Caroleen Hamman LPN   Activities of Daily Living In your present state of health, do you have any difficulty performing the following activities: 10/18/2020 01/11/2020  Hearing? N N  Vision? N N  Difficulty concentrating or making decisions? Y N  Comment sees a neurologist -  Walking or climbing stairs? N N  Dressing or bathing? N N  Doing errands, shopping? N N  Preparing  Food and eating ? N -  Using the Toilet? N -  In the past six months, have you accidently leaked urine? N -  Do you have problems with loss of bowel control? N -  Managing your Medications? N -  Managing your Finances? N -  Housekeeping or managing your Housekeeping? N -  Some recent data might be hidden    Patient Care Team: Carollee Herter, Alferd Apa, DO as PCP - General Minus Breeding, MD as Consulting Physician (Cardiology) Warden Fillers, MD as Consulting Physician (Ophthalmology)  Indicate any recent Medical Services you may have received from other than [facility] providers in the past year (date may be approximate).     Assessment:   This is a routine wellness examination for Shawn [name].  Hearing/Vision screen Hearing Screening - Comments:: No issues Vision Screening - Comments:: Wears glasses Last eye exam-2021-Dr. Groat  Dietary issues and exercise activities discussed: Current Exercise Habits: Home exercise routine, Type of exercise: walking, Time (Minutes): 30, Frequency (Times/Week): 6, Weekly Exercise (Minutes/Week): 180, Intensity: Mild, Exercise limited by: None identified   Goals Addressed             This Visit's Progress    Patient Stated       Maintain current health       Depression Screen PHQ 2/9 Scores 10/18/2020 01/11/2020 01/06/2019 12/30/2017 12/24/2016 11/15/2014 11/15/2014  PHQ - 2 Score 0 0 0 0 0 0 0  PHQ- 9 Score - - - 3 - - -    Fall Risk Fall  Risk  10/18/2020 04/22/2020 01/11/2020 12/24/2016 11/15/2014  Falls in the past year? 0 0 0 No No  Number falls in past yr: 0 0 0 - -  Injury with Fall? 0 0 0 - -  Follow up Falls prevention discussed - Falls evaluation completed - -    FALL RISK PREVENTION PERTAINING TO THE HOME:  Any stairs in or around the home? No  Home free of loose throw rugs in walkways, pet beds, electrical cords, etc? Yes  Adequate lighting in your home to reduce risk of falls? Yes   ASSISTIVE DEVICES UTILIZED TO PREVENT FALLS:  Life alert? No  Use of a cane, walker or w/c? No  Grab bars in the bathroom? Yes  Shower chair or bench in shower? No  Elevated toilet seat or a handicapped toilet? No   TIMED UP AND GO:  Was the test performed? No . Phone visit   Cognitive Function:Patient currently sees neurology for memory loss. MMSE - Mini Mental State Exam 12/22/2015  Orientation to time 5  Orientation to Place 5  Registration 3  Attention/ Calculation 5  Recall 3  Language- name 2 objects 2  Language- repeat 1  Language- follow 3 step command 3  Language- read & follow direction 1  Write a sentence 1  Copy design 1  Total score 30   Montreal Cognitive Assessment  02/29/2020  Visuospatial/ Executive (0/5) 1  Naming (0/3) 3  Attention: Read list of digits (0/2) 1  Attention: Read list of letters (0/1) 1  Attention: Serial 7 subtraction starting at 100 (0/3) 1  Language: Repeat phrase (0/2) 1  Language : Fluency (0/1) 0  Abstraction (0/2) 1  Delayed Recall (0/5) 0  Orientation (0/6) 4  Total 13  Adjusted Score (based on education) 14      Immunizations Immunization History  Administered Date(s) Administered   Fluad Quad(high Dose 65+) 11/13/2018, 11/11/2019   Influenza Split 11/17/2010, 11/29/2011  Influenza Whole 11/12/2007, 11/17/2008, 11/09/2009   Influenza, High Dose Seasonal PF 11/15/2014, 11/10/2015, 11/23/2016, 11/13/2017   Influenza,inj,Quad PF,6+ Mos 10/30/2012, 11/13/2013,  02/01/2015   Moderna Sars-Covid-2 Vaccination 02/27/2019, 03/30/2019, 12/07/2019   Pneumococcal Conjugate-13 12/15/2013   Pneumococcal Polysaccharide-23 09/04/2005, 01/21/2012, 05/03/2020   Td 01/18/1999, 10/13/2008   Tdap 01/21/2012   Zoster Recombinat (Shingrix) 02/06/2017, 05/07/2017   Zoster, Live 11/27/2006    TDAP status: Up to date  Flu Vaccine status: Due, Education has been provided regarding the importance of this vaccine. Advised may receive this vaccine at local pharmacy or Health Dept. Aware to provide a copy of the vaccination record if obtained from local pharmacy or Health Dept. Verbalized acceptance and understanding.  Pneumococcal vaccine status: Up to date  Covid-19 vaccine status: Information provided on how to obtain vaccines. Booster due  Qualifies for Shingles Vaccine? No   Zostavax completed Yes   Shingrix Completed?: Yes  Screening Tests Health Maintenance  Topic Date Due   COVID-19 Vaccine (4 - Booster for Moderna series) 04/05/2020   INFLUENZA VACCINE  09/05/2020   TETANUS/TDAP  01/20/2022   PNA vac Low Risk Adult  Completed   Zoster Vaccines- Shingrix  Completed   HPV VACCINES  Aged Out    Health Maintenance  Health Maintenance Due  Topic Date Due   COVID-19 Vaccine (4 - Booster for Moderna series) 04/05/2020   INFLUENZA VACCINE  09/05/2020    Colorectal cancer screening: No longer required.   Lung Cancer Screening: (Low Dose CT Chest recommended if Age 36-80 years, 30 pack-year currently smoking OR have quit w/in 15years.) does not qualify.    Additional Screening:  Hepatitis C Screening: does not qualify  Vision Screening: Recommended annual ophthalmology exams for early detection of glaucoma and other disorders of the eye. Is the patient up to date with their annual eye exam?  Yes  Who is the provider or what is the name of the office in which the patient attends annual eye exams? Dr. Katy Fitch   Dental Screening: Recommended annual  dental exams for proper oral hygiene  Community Resource Referral / Chronic Care Management: CRR required this visit?  No   CCM required this visit?  No      Plan:     I have personally reviewed and noted the following in the patient's chart:   Medical and social history Use of alcohol, tobacco or illicit drugs  Current medications and supplements including opioid prescriptions. Patient is not currently taking opioid prescriptions. Functional ability and status Nutritional status Physical activity Advanced directives List of other physicians Hospitalizations, surgeries, and ER visits in previous 12 months Vitals Screenings to include cognitive, depression, and falls Referrals and appointments  In addition, I have reviewed and discussed with patient certain preventive protocols, quality metrics, and best practice recommendations. A written personalized care plan for preventive services as well as general preventive health recommendations were provided to patient.   Due to this being a telephonic visit, the after visit summary with patients personalized plan was offered to patient via mail or my-chart. Patient would like to access on my-chart.   Marta Antu, LPN   [phone]  Nurse health Advisor  Nurse Notes: None

## 2020-11-08 ENCOUNTER — Telehealth: Payer: Self-pay | Admitting: Family Medicine

## 2020-11-08 NOTE — Telephone Encounter (Signed)
Patient has concerns about his warfarin medication, stating he has heard bad stuff about it. He states that  patients that have been on it for long periods of time have suffered brain hemorrhages. He would like to know if [facility] would change him to another medication or have him stay on it. Please advice.

## 2020-11-09 ENCOUNTER — Telehealth: Payer: Self-pay | Admitting: Cardiology

## 2020-11-09 NOTE — Telephone Encounter (Signed)
The patient was calling to see if he could switch to Eliquis from warfarin. He discussed this with his PCP and they suggested he speak with his cardiologist (note in epic).   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

## 2020-11-09 NOTE — Telephone Encounter (Signed)
Pt c/o medication issue:  1. Name of Medication: warfarin (COUMADIN) 5 MG tablet  2. How are you currently taking this medication (dosage and times per day)? TAKE 1 TABLET BY MOUTH 4 DAYS A WEEK  3. Are you having a reaction (difficulty breathing--STAT)? NO  4. What is your medication issue? PT WANTS TO KNOW IF HE CAN SWITCH FROM WARAFIN TO Arne [place]

## 2020-11-09 NOTE — Telephone Encounter (Signed)
Spoke with patient. Pt verbalized understanding  °

## 2020-11-10 NOTE — Telephone Encounter (Signed)
Would recommend Eliquis 5mg  BID given hx of PE, DVT, and factor V leiden deficiency. He should take first dose after 1 washout day with no anticoagulation so that his INR has time to drop (ie if last warfarin dose was today on Wednesday, first Eliquis dose would be Friday morning).

## 2020-11-11 NOTE — Telephone Encounter (Signed)
The patient stated that he would like to think about before switching. He will call back if he decides to make the switch.

## 2020-11-14 NOTE — Telephone Encounter (Signed)
Pt's wife is calling regarding pt switching from [place] to Xarelto or Eliquis, pt has more questions and would like a callback to discuss further. Please advise pt further Pt's wife states they are leaving home now and will not be available until after 12pm so please callback after noon today.

## 2020-11-15 ENCOUNTER — Other Ambulatory Visit: Payer: Self-pay

## 2020-11-15 MED ORDER — APIXABAN 5 MG PO TABS: 5.0000 mg | ORAL_TABLET | Freq: Two times a day (BID) | ORAL | 1 refills | Status: DC

## 2020-11-15 NOTE — Telephone Encounter (Signed)
Spoke with pt wife, the patient has decided he would like to switch to eliquis. She would like this message forwarded to Dr Cheri Rous so they can make that change for them.

## 2020-11-15 NOTE — Telephone Encounter (Signed)
Spoke with patient. Pt verbalized understanding. New Rx sent

## 2020-11-15 NOTE — Telephone Encounter (Signed)
Unable to reach pt or leave a message  

## 2020-11-16 ENCOUNTER — Other Ambulatory Visit: Payer: Self-pay

## 2020-11-16 ENCOUNTER — Ambulatory Visit (INDEPENDENT_AMBULATORY_CARE_PROVIDER_SITE_OTHER): Payer: Medicare HMO

## 2020-11-16 DIAGNOSIS — Z23 Encounter for immunization: Secondary | ICD-10-CM | POA: Diagnosis not present

## 2020-11-16 NOTE — Progress Notes (Signed)
Pt here for INR check per Dr. Etter Sjogren  Goal INR = 2.0--3.0  Last INR = 2.3  Pt currently takes Coumadin 5 MG 3 times a week, 7.5 MG 4 times a week. Pt has not taking anything today due to switch in medication.   Pt will be switching to Eliquis 5 MG tomorrow.   Pt denies recent antibiotics, no dietary changes and no unusual bruising / bleeding.  INR today = 1.8  Pt advised per Dr. Larose Kells to start Eliquis tomorrow.

## 2020-11-22 ENCOUNTER — Ambulatory Visit: Payer: Medicare HMO | Admitting: Neurology

## 2020-11-22 ENCOUNTER — Other Ambulatory Visit: Payer: Self-pay

## 2020-11-22 ENCOUNTER — Encounter: Payer: Self-pay | Admitting: Neurology

## 2020-11-22 VITALS — BP 142/85 | HR 70 | Ht 66.0 in | Wt 178.0 lb

## 2020-11-22 DIAGNOSIS — F0390 Unspecified dementia without behavioral disturbance: Secondary | ICD-10-CM | POA: Diagnosis not present

## 2020-11-22 DIAGNOSIS — R69 Illness, unspecified: Secondary | ICD-10-CM | POA: Diagnosis not present

## 2020-11-22 NOTE — Progress Notes (Signed)
NEUROLOGY FOLLOW UP OFFICE NOTE  Shawn [name] [id number] 15-Jun-1934  HISTORY OF PRESENT ILLNESS: I had the pleasure of seeing Shawn [name] in follow-up in the neurology clinic on 11/22/2020.  The patient was last seen 7 months ago for dementia. He is again accompanied by his wife who helps supplement the history today.  Records and images were personally reviewed where available.  He had an EEG in 05/2020 which was normal. He started Donepezil 10mg  daily last 05/2020 which initially caused sleep difficulties, taking it in the morning helps him sleep better but he still has vivid unpleasant dreams sometimes. His wife notes he reaches for something in his sleep sometimes. He feels his memory "could be better." Fidgeting is not as bad. His wife feels there is improvement since his last visit, he still has memory problems but is "less vague." He continues to drive and denies getting lost, but "she hollers at me that I go too fast." She states she has noticed changes in his concentration and judgement. He manages medications and finances without difficulty. His wife notes slight personality changes, sometimes he says something he would not have said in the past. No paranoia or hallucinations. He denies any headaches, dizziness. Sleep is good, he feels rested. No falls.    History on Initial Assessment 04/22/2020: This is an 86 year old right-handed man with a history of hyperlipidemia, PE and DVT, Factor V Leiden deficiency, presenting for evaluation of dementia. His wife is present to provide additional information. Records were reviewed, he underwent Neuropsychological evaluation in January 2022 with very significant cognitive impairment with profound visuospatial problems and additional low scores on measures of processing speed, executive function, and working memory. 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 Atypical Alzheimer's is also a possibility. Vascular disease may be contributing but does not well explain his higher cortical signs. MRI brain in 2017 showed moderate chronic microvascular disease and mild diffuse volume loss. Repeat MRI brain in 2022 showed progression of chronic microvascular disease, no acute changes.   He feels his memory is good, "not perfect." His wife started noticing forgetfulness and word-finding difficulties a couple of years ago. His handwriting has always been terrible, a little more illegible than before. He continues to drive and denies getting lost driving. He manages his own medications and denies missing doses. They do bills together. His wife denies any difficulties following instructions or using the remote control/microwave. He is independent with dressing and bathing. No personality changes, mood is "same as always," no paranoia or hallucinations.  They started noticing involuntary hand movements a few months ago. He has intermittent right arm myoclonus throughout the visit. They note he moves his fingers without realizing. Movements do not affect writing or using utensils. He has not noticed them in his legs. No falls. Sleep is good, no REM behavior disorder. He does fall asleep when he reads during the day. No staring/unresponsive episodes, gaps in time, olfactory/gustatory hallucinations. No change in gait, he denies any stiffness/difficulty with getting out of bed.   Laboratory Data: Lab Results  Component Value Date   TSH 1.73 01/11/2020   Lab Results  Component Value Date   VITAMINB12 >1526 (H) 01/11/2020    PAST MEDICAL HISTORY: Past Medical History:  Diagnosis Date   Allergy    Cardiac murmur    mild  aortic sclerosis   Cataract    left eye   Clotting disorder (HCC)    Factor v Leiden factor mutation   Colon polyps    Hyperplastic   Diverticulosis    Dysphagia    Factor V Leiden mutation ([place])    Hemorrhoids    History of blood clots    Hyperlipidemia    Osteoarthritis     MEDICATIONS: Current Outpatient Medications on File Prior to Visit  Medication Sig Dispense Refill   apixaban (ELIQUIS) 5 MG TABS tablet Take 1 tablet (5 mg total) by mouth 2 (two) times daily. 180 tablet 1   Cholecalciferol 25 MCG (1000 UT) capsule Take 2,000 Units by mouth daily.     Cyanocobalamin (VITAMIN B-12) 5000 MCG TBDP Take 1 tablet by mouth daily.     donepezil (ARICEPT) 10 MG tablet Take 1/2 tablet daily for 2 weeks, then increase to 1 tablet daily 30 tablet 11   folic acid (FOLVITE) 202 MCG tablet Take 400 mcg by mouth daily.     Hyaluronic Acid-Vitamin C (HYALURONIC ACID PO) Take 2 tablets by mouth daily. (200 mg)     Krill Oil 1000 MG CAPS Take 1 capsule by mouth daily.     Multiple Vitamin (MULTIVITAMIN) tablet Take 1 tablet by mouth daily.     Omega-3 Fatty Acids (FISH OIL PO) Take 2,500 mg by mouth daily.     Omega-3 Fatty Acids (FISH OIL) 1000 MG CAPS Take 4,000 mg by mouth daily.     Saw Palmetto 450-15 MG CAPS Take 1 capsule by mouth daily.     No current facility-administered medications on file prior to visit.    ALLERGIES: Allergies  Allergen Reactions   Niacin Rash    "My whole body turned red on the prescription dose" He takes the OTC medication   Pravastatin Other (See Comments)    Disorientation    FAMILY HISTORY: Family History  Problem Relation Age of Onset   Lung cancer Father    Heart attack Mother 70   Heart disease Mother        chf   Arthritis Mother    Colon cancer Neg Hx    Esophageal cancer Neg Hx    Rectal cancer Neg Hx    Stomach cancer Neg Hx     SOCIAL HISTORY: Social History   Socioeconomic History   Marital status: Married    Spouse name:  Not on file   Number of children: 4   Years of education: Not on file   Highest education level: Not on file  Occupational History   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    Right handed   One story home   Social Determinants of Health   Financial Resource Strain: Low Risk    Difficulty of Paying Living  Expenses: Not hard at all  Food Insecurity: No Food Insecurity   Worried About Charity fundraiser in the Last Year: Never true   Ran Out of Food in the Last Year: Never true  Transportation Needs: No Transportation Needs   Lack of Transportation (Medical): No   Lack of Transportation (Non-Medical): No  Physical Activity: Sufficiently Active   Days of Exercise per Week: 6 days   Minutes of Exercise per Session: 30 min  Stress: No Stress Concern Present   Feeling of Stress : Not at all  Social Connections: Socially Isolated   Frequency of Communication with Friends and Family: Once a week   Frequency of Social Gatherings with Friends and Family: Once a week   Attends Religious Services: Never   Marine scientist or Organizations: No   Attends Music therapist: Never   Marital Status: Married  Human resources officer Violence: Not At Risk   Fear of Current or Ex-Partner: No   Emotionally Abused: No   Physically Abused: No   Sexually Abused: No     PHYSICAL EXAM: Vitals:   11/22/20 1132  BP: (!) 142/85  Pulse: 70  SpO2: 96%   General: No acute distress Head:  Normocephalic/atraumatic Skin/Extremities: No  rash, no edema Neurological Exam: alert and oriented to person, place, states date is 12/28/1921 (it is 11/22/2020). No aphasia or dysarthria. Fund of knowledge is appropriate.  Recent and remote memory are impaired, 1/3 delayed recall. Attention and concentration are normal. He was able to do Trail Making Test adequately, unable to draw cube well. He required 3 trials for clock drawing, on third trial, clock was correct.  Cranial nerves: Pupils equal, round. Extraocular movements intact with no nystagmus. Visual fields full.  No facial asymmetry.  Motor: Bulk and tone normal, no cogwheeling, muscle strength 5/5 throughout with no pronator drift.   Finger to nose testing intact.  Gait narrow-based and steady, no ataxia, good arm swing. No hand tremors. He sits with his arms crossed with note of slight upper body jerking movements, none noted when he outstretches arms or while writing.   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 Atypical Alzheimer's is also a possibility. Vascular disease may be contributing but does not well explain his higher cortical signs. MRI brain in 2022 showed progression of chronic microvascular disease, no acute changes. EEG normal. He and his wife feel symptoms overall stable/slightly better, continue Donepezil 10mg  daily. He appears to have less myoclonus today, no parkinsonian signs. We discussed driving concerns, recommended having a driving evaluation, information provided. We again discussed the importance of control of vascular risk factors, physical exercise, brain stimulation exercises, and MIND diet for brain health. Follow-up with [facility] Sharene Butters in 6 months, they know to call for any changes.    Thank you for allowing me to participate in his care.  Please do not hesitate to call for any questions or concerns.   Ellouise Newer, M.D.   CC: Dr. Cheri Rous

## 2020-11-22 NOTE — Patient Instructions (Signed)
Continue Donepezil (Aricept) 10mg  every morning  2. Recommend driving evaluation: You can contact the following centers for evaluation  [facility] in [place]  [place] [facility] [phone] or [phone]   3. Follow-up in 6 months, call for any changes   FALL PRECAUTIONS: Be cautious when walking. Scan the area for obstacles that may increase the risk of trips and falls. When getting up in the mornings, sit up at the edge of the bed for a few minutes before getting out of bed. Consider elevating the bed at the head end to avoid drop of blood pressure when getting up. Walk always in a well-lit room (use night lights in the walls). Avoid area rugs or power cords from appliances in the middle of the walkways. Use a walker or a cane if necessary and consider physical therapy for balance exercise. Get your eyesight checked regularly.  FINANCIAL OVERSIGHT: Supervision, especially oversight when making financial decisions or transactions is also recommended as difficulties arise.  HOME SAFETY: Consider the safety of the kitchen when operating appliances like stoves, microwave oven, and blender. Consider having supervision and share cooking responsibilities until no longer able to participate in those. Accidents with firearms and other hazards in the house should be identified and addressed as well.  DRIVING: Regarding driving, in patients with progressive memory problems, driving will be impaired. We advise to have someone else do the driving if trouble finding directions or if minor accidents are reported. Independent driving assessment is available to determine safety of driving.  ABILITY TO BE LEFT ALONE: If patient is unable to contact 911 operator, consider using LifeLine, or when the need is there, arrange for someone to stay with patients. Smoking is a fire hazard, consider  supervision or cessation. Risk of wandering should be assessed by caregiver and if detected at any point, supervision and safe proof recommendations should be instituted.  MEDICATION SUPERVISION: Inability to self-administer medication needs to be constantly addressed. Implement a mechanism to ensure safe administration of the medications.  RECOMMENDATIONS FOR ALL PATIENTS WITH MEMORY PROBLEMS: 1. Continue to exercise (Recommend 30 minutes of walking everyday, or 3 hours every week) 2. Increase social interactions - continue going to [place] and enjoy social gatherings with friends and family 3. Eat healthy, avoid fried foods and eat more fruits and vegetables 4. Maintain adequate blood pressure, blood sugar, and blood cholesterol level. Reducing the risk of stroke and cardiovascular disease also helps promoting better memory. 5. Avoid stressful situations. Live a simple life and avoid aggravations. Organize your time and prepare for the next day in anticipation. 6. Sleep well, avoid any interruptions of sleep and avoid any distractions in the bedroom that may interfere with adequate sleep quality 7. Avoid sugar, avoid sweets as there is a strong link between excessive sugar intake, diabetes, and cognitive impairment We discussed the Mediterranean diet, which has been shown to help patients reduce the risk of progressive memory disorders and reduces cardiovascular risk. This includes eating fish, eat fruits and green leafy vegetables, nuts like almonds and hazelnuts, walnuts, and also use olive oil. Avoid fast foods and fried foods as much as possible. Avoid sweets and sugar as sugar use has been linked to worsening of memory function.

## 2020-11-28 ENCOUNTER — Telehealth: Payer: Self-pay | Admitting: Family Medicine

## 2020-11-28 ENCOUNTER — Other Ambulatory Visit: Payer: Self-pay

## 2020-11-28 MED ORDER — APIXABAN 5 MG PO TABS: 5.0000 mg | ORAL_TABLET | Freq: Two times a day (BID) | ORAL | 1 refills | Status: DC

## 2020-11-28 NOTE — Telephone Encounter (Signed)
Patient's wife is calling because they need to tranfers the prescription of Eloquis to the [place] due to the high cost. Wife states that we need to fax prescription information and office notes to [phone]. Dr. Theda Sers. Please advice.

## 2020-11-28 NOTE — Telephone Encounter (Signed)
Prescription and notes faxed to Dr. Theda Sers at fax number requested.

## 2020-12-13 NOTE — Telephone Encounter (Signed)
Pt spouse called regarding previous request to fax all office notes and prescription information to Dr. Theda Sers. Pt was advised no information received. Fax: [phone] or [phone].

## 2020-12-20 NOTE — Telephone Encounter (Signed)
Attempted to call pt. No answer. If patient is requesting a complete transfer of care then they need to sign a release so we can contact medical records.

## 2021-01-12 ENCOUNTER — Encounter: Payer: Self-pay | Admitting: Family Medicine

## 2021-01-12 ENCOUNTER — Ambulatory Visit (INDEPENDENT_AMBULATORY_CARE_PROVIDER_SITE_OTHER): Payer: Medicare HMO | Admitting: Family Medicine

## 2021-01-12 VITALS — BP 110/80 | HR 64 | Temp 97.8°F | Resp 18 | Ht 66.0 in | Wt 177.2 lb

## 2021-01-12 DIAGNOSIS — R413 Other amnesia: Secondary | ICD-10-CM | POA: Diagnosis not present

## 2021-01-12 DIAGNOSIS — Z86718 Personal history of other venous thrombosis and embolism: Secondary | ICD-10-CM | POA: Diagnosis not present

## 2021-01-12 DIAGNOSIS — Z0000 Encounter for general adult medical examination without abnormal findings: Secondary | ICD-10-CM | POA: Diagnosis not present

## 2021-01-12 DIAGNOSIS — E785 Hyperlipidemia, unspecified: Secondary | ICD-10-CM

## 2021-01-12 DIAGNOSIS — D6851 Activated protein C resistance: Secondary | ICD-10-CM | POA: Diagnosis not present

## 2021-01-12 DIAGNOSIS — I10 Essential (primary) hypertension: Secondary | ICD-10-CM

## 2021-01-12 DIAGNOSIS — I2782 Chronic pulmonary embolism: Secondary | ICD-10-CM | POA: Diagnosis not present

## 2021-01-12 DIAGNOSIS — R351 Nocturia: Secondary | ICD-10-CM | POA: Diagnosis not present

## 2021-01-12 LAB — CBC WITH DIFFERENTIAL/PLATELET
Basophils Absolute: 0 10*3/uL (ref 0.0–0.1)
Basophils Relative: 0.6 % (ref 0.0–3.0)
Eosinophils Absolute: 0.1 10*3/uL (ref 0.0–0.7)
Eosinophils Relative: 2 % (ref 0.0–5.0)
HCT: 48 % (ref 39.0–52.0)
Hemoglobin: 16.7 g/dL (ref 13.0–17.0)
Lymphocytes Relative: 39 % (ref 12.0–46.0)
Lymphs Abs: 1.4 10*3/uL (ref 0.7–4.0)
MCHC: 34.7 g/dL (ref 30.0–36.0)
MCV: 97.7 fl (ref 78.0–100.0)
Monocytes Absolute: 0.4 10*3/uL (ref 0.1–1.0)
Monocytes Relative: 10.3 % (ref 3.0–12.0)
Neutro Abs: 1.7 10*3/uL (ref 1.4–7.7)
Neutrophils Relative %: 48.1 % (ref 43.0–77.0)
Platelets: 133 10*3/uL — ABNORMAL LOW (ref 150.0–400.0)
RBC: 4.91 Mil/uL (ref 4.22–5.81)
RDW: 13.3 % (ref 11.5–15.5)
WBC: 3.6 10*3/uL — ABNORMAL LOW (ref 4.0–10.5)

## 2021-01-12 LAB — LIPID PANEL
Cholesterol: 184 mg/dL (ref 0–200)
HDL: 39 mg/dL — ABNORMAL LOW (ref >39.00)
LDL Cholesterol: 115 mg/dL — ABNORMAL HIGH (ref 0–99)
NonHDL: 145.06
Total CHOL/HDL Ratio: 5
Triglycerides: 149 mg/dL (ref 0.0–149.0)
VLDL: 29.8 mg/dL (ref 0.0–40.0)

## 2021-01-12 LAB — COMPREHENSIVE METABOLIC PANEL
ALT: 16 U/L (ref 0–53)
AST: 20 U/L (ref 0–37)
Albumin: 4.2 g/dL (ref 3.5–5.2)
Alkaline Phosphatase: 47 U/L (ref 39–117)
BUN: 14 mg/dL (ref 6–23)
CO2: 33 mEq/L — ABNORMAL HIGH (ref 19–32)
Calcium: 9.7 mg/dL (ref 8.4–10.5)
Chloride: 101 mEq/L (ref 96–112)
Creatinine, Ser: 1.03 mg/dL (ref 0.40–1.50)
GFR: 66.95 mL/min (ref >60.00)
Glucose, Bld: 96 mg/dL (ref 70–99)
Potassium: 4 mEq/L (ref 3.5–5.1)
Sodium: 140 mEq/L (ref 135–145)
Total Bilirubin: 1.8 mg/dL — ABNORMAL HIGH (ref 0.2–1.2)
Total Protein: 6.9 g/dL (ref 6.0–8.3)

## 2021-01-12 LAB — PSA: PSA: 3.65 ng/mL (ref 0.10–4.00)

## 2021-01-12 MED ORDER — APIXABAN 5 MG PO TABS: 5.0000 mg | ORAL_TABLET | Freq: Two times a day (BID) | ORAL | 3 refills | Status: DC

## 2021-01-12 NOTE — Assessment & Plan Note (Signed)
Well controlled, no changes to meds. Encouraged heart healthy diet such as the DASH diet and exercise as tolerated.  °

## 2021-01-12 NOTE — Patient Instructions (Signed)
Preventive Care 65 Years and Older, Male °Preventive care refers to lifestyle choices and visits with your health care provider that can promote health and wellness. Preventive care visits are also called wellness exams. °What can I expect for my preventive care visit? °Counseling °During your preventive care visit, your health care provider may ask about your: °Medical history, including: °Past medical problems. °Family medical history. °History of falls. °Current health, including: °Emotional well-being. °Home life and relationship well-being. °Sexual activity. °Memory and ability to understand (cognition). °Lifestyle, including: °Alcohol, nicotine or tobacco, and drug use. °Access to firearms. °Diet, exercise, and sleep habits. °Work and work environment. °Sunscreen use. °Safety issues such as seatbelt and bike helmet use. °Physical exam °Your health care provider will check your: °Height and weight. These may be used to calculate your BMI (body mass index). BMI is a measurement that tells if you are at a healthy weight. °Waist circumference. This measures the distance around your waistline. This measurement also tells if you are at a healthy weight and may help predict your risk of certain diseases, such as type 2 diabetes and high blood pressure. °Heart rate and blood pressure. °Body temperature. °Skin for abnormal spots. °What immunizations do I need? °Vaccines are usually given at various ages, according to a schedule. Your health care provider will recommend vaccines for you based on your age, medical history, and lifestyle or other factors, such as travel or where you work. °What tests do I need? °Screening °Your health care provider may recommend screening tests for certain conditions. This may include: °Lipid and cholesterol levels. °Diabetes screening. This is done by checking your blood sugar (glucose) after you have not eaten for a while (fasting). °Hepatitis C test. °Hepatitis B test. °HIV (human  immunodeficiency virus) test. °STI (sexually transmitted infection) testing, if you are at risk. °Lung cancer screening. °Colorectal cancer screening. °Prostate cancer screening. °Abdominal aortic aneurysm (AAA) screening. You may need this if you are a current or former smoker. °Talk with your health care provider about your test results, treatment options, and if necessary, the need for more tests. °Follow these instructions at home: °Eating and drinking ° °Eat a diet that includes fresh fruits and vegetables, whole grains, lean protein, and low-fat dairy products. Limit your intake of foods with high amounts of sugar, saturated fats, and salt. °Take vitamin and mineral supplements as recommended by your health care provider. °Do not drink alcohol if your health care provider tells you not to drink. °If you drink alcohol: °Limit how much you have to 0-2 drinks a day. °Know how much alcohol is in your drink. In the [place] one drink equals one 12 oz bottle of beer (355 mL), one 5 oz glass of wine (148 mL), or one 1½ oz glass of hard liquor (44 mL). °Lifestyle °Brush your teeth every morning and night with fluoride toothpaste. Floss one time each day. °Exercise for at least 30 minutes 5 or more days each week. °Do not use any products that contain nicotine or tobacco. These products include cigarettes, chewing tobacco, and vaping devices, such as e-cigarettes. If you need help quitting, ask your health care provider. °Do not use drugs. °If you are sexually active, practice safe sex. Use a condom or other form of protection to prevent STIs. °Take aspirin only as told by your health care provider. Make sure that you understand how much to take and what form to take. Work with your health care provider to find out whether it is safe and   beneficial for you to take aspirin daily. °Ask your health care provider if you need to take a cholesterol-lowering medicine (statin). °Find healthy ways to manage stress, such  as: °Meditation, yoga, or listening to music. °Journaling. °Talking to a trusted person. °Spending time with friends and family. °Safety °Always wear your seat belt while driving or riding in a vehicle. °Do not drive: °If you have been drinking alcohol. Do not ride with someone who has been drinking. °When you are tired or distracted. °While texting. °If you have been using any mind-altering substances or drugs. °Wear a helmet and other protective equipment during sports activities. °If you have firearms in your house, make sure you follow all gun safety procedures. °Minimize exposure to UV radiation to reduce your risk of skin cancer. °What's next? °Visit your health care provider once a year for an annual wellness visit. °Ask your health care provider how often you should have your eyes and teeth checked. °Stay up to date on all vaccines. °This information is not intended to replace advice given to you by your health care provider. Make sure you discuss any questions you have with your health care provider. °Document Revised: 07/20/2020 Document Reviewed: 07/20/2020 °Elsevier Patient Education © 2022 [employer] ° °

## 2021-01-12 NOTE — Assessment & Plan Note (Signed)
On eliquis

## 2021-01-12 NOTE — Progress Notes (Signed)
Subjective:   By signing my name below, I, Shawn [name], attest that this documentation has been prepared under the direction and in the presence of Dr. Roma Schanz, DO. 01/12/2021    Patient ID: Shawn [name] Zettie Cooley, male    DOB: 09/17/1934, 86 y.o.   MRN: [id number]  Chief Complaint  Patient presents with   Annual Exam    Pt states fasting     HPI Patient is in today for a comprehensive physical exam.  His wife reports that he is becoming more forgetful. He finds he has to hesitate while speaking often to remember what to say. He is not interested for further evaluation at this time.  He is requesting a refill on 5 mg Eliquis daily PO.  He continues seeing his ophthalmologist regularly. He continues seeing his cardiologist regularly.  He continues taking 450-15 mg saw palmetto 2x daily PO and reports no new issues while taking it. He finds no change while taking it.  He prefers to check his PSA levels during his next lab visit.  He denies having any fever, new moles, congestion, sore throat, new muscle pain, new joint pain, chest pain, cough, SOB, wheezing, n/v/d, constipation, blood in stool, dysuria, frequency, hematuria, or headaches at this time. He was informed of the bivalent Covid-19 vaccine.  He has no recent changes in his family medical history.  He continues to participate in exercise by walking.    Past Medical History:  Diagnosis Date   Allergy    Cardiac murmur    mild aortic sclerosis   Cataract    left eye   Clotting disorder (HCC)    Factor v Leiden factor mutation   Colon polyps    Hyperplastic   Diverticulosis    Dysphagia    Factor V Leiden mutation ([place])    Hemorrhoids    History of blood clots    Hyperlipidemia    Osteoarthritis     Past Surgical History:  Procedure Laterality Date   APPENDECTOMY     CATARACT EXTRACTION  02/09/2007   right eye   INGUINAL HERNIA REPAIR     TONSILLECTOMY     VARICOSE VEIN SURGERY      VASECTOMY      Family History  Problem Relation Age of Onset   Lung cancer Father    Heart attack Mother 77   Heart disease Mother        chf   Arthritis Mother    Colon cancer Neg Hx    Esophageal cancer Neg Hx    Rectal cancer Neg Hx    Stomach cancer Neg Hx     Social History   Socioeconomic History   Marital status: Married    Spouse name: Not on file   Number of children: 4   Years of education: Not on file   Highest education level: Not on file  Occupational History   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    Right handed   One story home   Social Determinants of Health   Financial Resource Strain: Low Risk    Difficulty of Paying Living Expenses: Not hard at all  Food Insecurity: No Food Insecurity   Worried About Charity fundraiser in the Last Year: Never true   Ran Out of Food in the Last Year: Never true  Transportation Needs: No Transportation Needs   Lack of Transportation (Medical): No   Lack of Transportation (Non-Medical): No  Physical Activity: Sufficiently Active   Days of Exercise per Week: 6 days   Minutes of Exercise per Session: 30 min  Stress: No Stress Concern Present   Feeling of Stress : Not at all  Social Connections: Socially Isolated   Frequency of Communication with Friends and Family: Once a week   Frequency of Social Gatherings with Friends and Family: Once a week   Attends Religious Services: Never   Marine scientist or Organizations: No   Attends Arts administrator: Never   Marital Status: Married  Human resources officer Violence: Not At Risk   Fear of Current or Ex-Partner: No   Emotionally Abused: No   Physically Abused: No   Sexually Abused: No    Outpatient Medications Prior to Visit  Medication Sig Dispense Refill   Cholecalciferol 25 MCG (1000 UT) capsule Take 5,000 Units by mouth daily.     Cyanocobalamin (VITAMIN B-12) 5000 MCG TBDP Take 1 tablet by mouth daily.     donepezil (ARICEPT) 10 MG tablet Take 1/2 tablet daily for 2 weeks, then increase to 1 tablet daily 30 tablet 11   folic acid (FOLVITE) 174 MCG tablet Take 400 mcg by mouth daily.     Hyaluronic Acid-Vitamin C (HYALURONIC ACID PO) Take 2 tablets by mouth daily. (200 mg)     Multiple Vitamin (MULTIVITAMIN) tablet Take 1 tablet by mouth daily.     Omega-3 Fatty Acids (FISH OIL) 1000 MG CAPS Take 3,500 mg by mouth daily.     Saw Palmetto 450-15 MG CAPS Take 1 capsule by mouth daily. (Patient taking differently: Take 2 capsules by mouth daily.)     apixaban (ELIQUIS) 5 MG TABS tablet Take 1 tablet (5 mg total) by mouth 2 (two) times daily. 180 tablet 1   Krill Oil 1000 MG CAPS Take 1 capsule by mouth daily. (Patient not taking: Reported on 01/12/2021)     Omega-3 Fatty Acids (FISH OIL PO) Take 2,500 mg by mouth daily. (Patient not taking: Reported on 01/12/2021)     No facility-administered medications prior to visit.    Allergies  Allergen Reactions   Niacin Rash    "My whole body turned red on the prescription dose" He takes the OTC medication   Pravastatin Other (See Comments)    Disorientation    Review of Systems  Constitutional:  Negative for fever.  HENT:  Negative for congestion and sore throat.   Respiratory:  Negative for cough, shortness of breath and wheezing.   Cardiovascular:  Negative for chest pain.  Gastrointestinal:  Negative for blood in stool, constipation, diarrhea, nausea and vomiting.  Genitourinary:  Negative for dysuria, frequency and hematuria.   Musculoskeletal:  Negative for joint pain and myalgias.  Skin:        (-)New moles  Neurological:  Negative for headaches.      Objective:    Physical Exam Constitutional:  General: He is not in acute distress.    Appearance: Normal appearance. He is not ill-appearing.  HENT:     Head: Normocephalic and atraumatic.     Right Ear: Tympanic membrane, ear canal and external ear normal.     Left Ear: Tympanic membrane, ear canal and external ear normal.  Eyes:     Extraocular Movements: Extraocular movements intact.     Pupils: Pupils are equal, round, and reactive to light.  Cardiovascular:     Rate and Rhythm: Normal rate and regular rhythm.     Heart sounds: Murmur heard.    No gallop.  Pulmonary:     Effort: Pulmonary effort is normal. No respiratory distress.     Breath sounds: Normal breath sounds. No wheezing or rales.  Abdominal:     General: Bowel sounds are normal. There is no distension.     Palpations: Abdomen is soft.     Tenderness: There is no abdominal tenderness. There is no guarding.  Skin:    General: Skin is warm and dry.  Neurological:     Mental Status: He is alert and oriented to person, place, and time.  Psychiatric:        Behavior: Behavior normal.        Judgment: Judgment normal.    BP 110/80 (BP Location: Left Arm, Patient Position: Sitting, Cuff Size: Normal)   Pulse 64   Temp 97.8 F (36.6 C) (Oral)   Resp 18   Ht 5\' 6"  (1.676 m)   Wt 177 lb 3.2 oz (80.4 kg)   SpO2 96%   BMI 28.60 kg/m  Wt Readings from Last 3 Encounters:  01/12/21 177 lb 3.2 oz (80.4 kg)  11/22/20 178 lb (80.7 kg)  10/18/20 172 lb (78 kg)    Diabetic Foot Exam - Simple   No data filed    Lab Results  Component Value Date   WBC 3.7 (L) 01/11/2020   HGB 16.9 01/11/2020   HCT 48.5 01/11/2020   PLT 136.0 (L) 01/11/2020   GLUCOSE 93 01/11/2020   CHOL 203 (H) 01/11/2020   TRIG 178.0 (H) 01/11/2020   HDL 35.30 (L) 01/11/2020   LDLDIRECT 86.0 06/17/2014    LDLCALC 132 (H) 01/11/2020   ALT 20 01/11/2020   AST 22 01/11/2020   NA 141 01/11/2020   K 4.7 01/11/2020   CL 101 01/11/2020   CREATININE 0.95 01/11/2020   BUN 14 01/11/2020   CO2 33 (H) 01/11/2020   TSH 1.73 01/11/2020   PSA 2.02 01/11/2020   INR 2.3 10/04/2020   MICROALBUR 0.7 11/15/2014    Lab Results  Component Value Date   TSH 1.73 01/11/2020   Lab Results  Component Value Date   WBC 3.7 (L) 01/11/2020   HGB 16.9 01/11/2020   HCT 48.5 01/11/2020   MCV 97.5 01/11/2020   PLT 136.0 (L) 01/11/2020   Lab Results  Component Value Date   NA 141 01/11/2020   K 4.7 01/11/2020   CO2 33 (H) 01/11/2020   GLUCOSE 93 01/11/2020   BUN 14 01/11/2020   CREATININE 0.95 01/11/2020   BILITOT 1.4 (H) 01/11/2020   ALKPHOS 42 01/11/2020   AST 22 01/11/2020   ALT 20 01/11/2020   PROT 7.1 01/11/2020   ALBUMIN 4.3 01/11/2020   CALCIUM 9.2 01/11/2020   GFR 74.30 01/11/2020   Lab Results  Component Value Date   CHOL 203 (H) 01/11/2020   Lab Results  Component Value Date   HDL 35.30 (L)  01/11/2020   Lab Results  Component Value Date   LDLCALC 132 (H) 01/11/2020   Lab Results  Component Value Date   TRIG 178.0 (H) 01/11/2020   Lab Results  Component Value Date   CHOLHDL 6 01/11/2020   No results found for: HGBA1C  PSA- Last completed 01/11/2020. Results showed 2.02.     Assessment & Plan:   Problem List Items Addressed This Visit       Unprioritized   Essential hypertension    Well controlled, no changes to meds. Encouraged heart healthy diet such as the DASH diet and exercise as tolerated.       Relevant Medications   apixaban (ELIQUIS) 5 MG TABS tablet   Hyperlipidemia - Primary    Encourage heart healthy diet such as MIND or DASH diet, increase exercise, avoid trans fats, simple carbohydrates and processed foods, consider a krill or fish or flaxseed oil cap daily.       Relevant Medications   apixaban (ELIQUIS) 5 MG TABS tablet   Other Relevant Orders    Lipid panel   Comprehensive metabolic panel   CBC with Differential/Platelet   PSA   MEMORY LOSS    Stable---- con't aricept      Preventative health care    ghm utd Check labs  See avs      Pulmonary embolism ([place])    On eliquis       Relevant Medications   apixaban (ELIQUIS) 5 MG TABS tablet   Other Visit Diagnoses     History of DVT in adulthood       Relevant Orders   Lipid panel   Comprehensive metabolic panel   CBC with Differential/Platelet   PSA   Factor V Leiden ([place])       Relevant Orders   Lipid panel   Comprehensive metabolic panel   CBC with Differential/Platelet   PSA   Nocturia       Relevant Orders   PSA        Meds ordered this encounter  Medications   apixaban (ELIQUIS) 5 MG TABS tablet    Sig: Take 1 tablet (5 mg total) by mouth 2 (two) times daily.    Dispense:  180 tablet    Refill:  3    I, Dr. Roma Schanz, DO, personally preformed the services described in this documentation.  All medical record entries made by the scribe were at my direction and in my presence.  I have reviewed the chart and discharge instructions (if applicable) and agree that the record reflects my personal performance and is accurate and complete. 01/12/2021   I,Shawn [name],acting as a scribe for Ann Held, DO.,have documented all relevant documentation on the behalf of Ann Held, DO,as directed by  Ann Held, DO while in the presence of Ann Held, DO.   Ann Held, DO

## 2021-01-12 NOTE — Assessment & Plan Note (Signed)
Encourage heart healthy diet such as MIND or DASH diet, increase exercise, avoid trans fats, simple carbohydrates and processed foods, consider a krill or fish or flaxseed oil cap daily.  °

## 2021-01-12 NOTE — Assessment & Plan Note (Signed)
Stable---- con't aricept

## 2021-01-12 NOTE — Assessment & Plan Note (Signed)
ghm utd Check labs  See avs  

## 2021-01-20 ENCOUNTER — Encounter: Payer: Self-pay | Admitting: *Deleted

## 2021-01-25 ENCOUNTER — Other Ambulatory Visit: Payer: Self-pay

## 2021-01-25 ENCOUNTER — Telehealth: Payer: Self-pay

## 2021-01-25 MED ORDER — APIXABAN 5 MG PO TABS: 5.0000 mg | ORAL_TABLET | Freq: Two times a day (BID) | ORAL | 3 refills | Status: DC

## 2021-01-25 NOTE — Telephone Encounter (Signed)
Sent most recent note, labs, and a prescription for Eliquis to the [place].

## 2021-02-14 DIAGNOSIS — H02834 Dermatochalasis of left upper eyelid: Secondary | ICD-10-CM | POA: Diagnosis not present

## 2021-02-14 DIAGNOSIS — H1045 Other chronic allergic conjunctivitis: Secondary | ICD-10-CM | POA: Diagnosis not present

## 2021-02-14 DIAGNOSIS — H04203 Unspecified epiphora, bilateral lacrimal glands: Secondary | ICD-10-CM | POA: Diagnosis not present

## 2021-02-14 DIAGNOSIS — H04123 Dry eye syndrome of bilateral lacrimal glands: Secondary | ICD-10-CM | POA: Diagnosis not present

## 2021-02-14 DIAGNOSIS — H02831 Dermatochalasis of right upper eyelid: Secondary | ICD-10-CM | POA: Diagnosis not present

## 2021-02-14 DIAGNOSIS — Z961 Presence of intraocular lens: Secondary | ICD-10-CM | POA: Diagnosis not present

## 2021-02-25 IMAGING — MR MR HEAD W/O CM
10 series · 48 of 48 positions shown · non-contrast
Comparison: July 2015

CLINICAL DATA: Memory loss

EXAM:
MRI HEAD WITHOUT CONTRAST
TECHNIQUE: Multiplanar, multiecho pulse sequences of the brain and surrounding
structures were obtained without intravenous contrast.

[Series 9: DWI · axial · 3.0mm · 1.28mm/px · z∈[-14,+147]mm · 8 of 110 slices shown (1 of 3)]
[im 1/110]
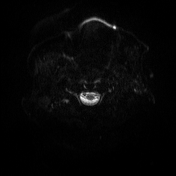
[im 16/110]
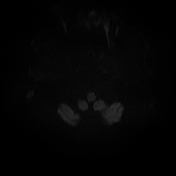
[im 32/110]
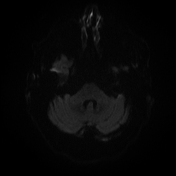
[im 47/110]
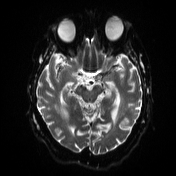
[im 63/110]
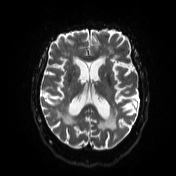
[im 78/110]
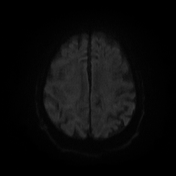
[im 94/110]
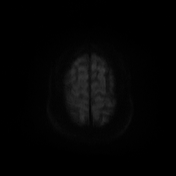
[im 110/110]
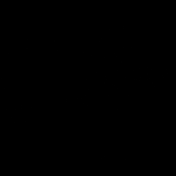

[Series 11: T1 · sagittal · 5.0mm · 0.75mm/px · 2 of 27 slices shown (1 of 2)]
[im 1/27]
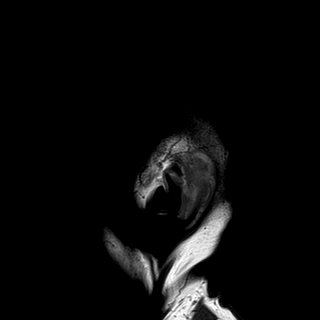
[im 27/27]
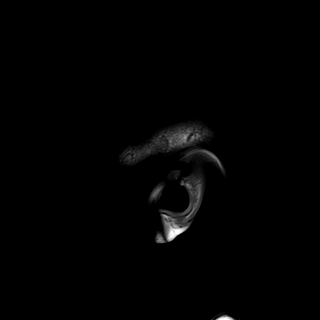

[Series 12: T2 · axial · 5.0mm · 0.57mm/px · z∈[-5,+150]mm · 2 of 25 slices shown (1 of 2)]
[im 1/25]
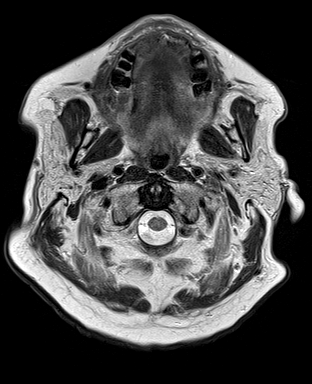
[im 25/25]
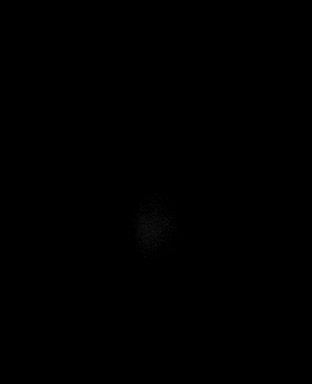

[Series 13: mip_images(sw) · axial · 24.0mm · 0.69mm/px · z∈[+1,+144]mm · 4 of 49 slices shown]
[im 1/49]
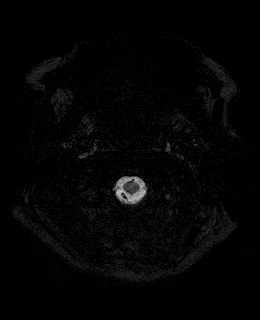
[im 17/49]
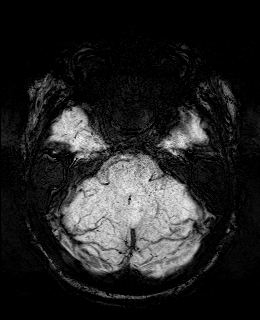
[im 33/49]
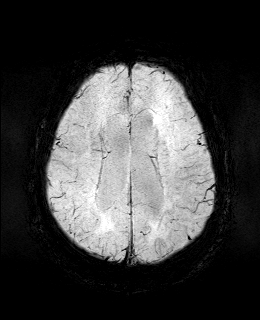
[im 49/49]
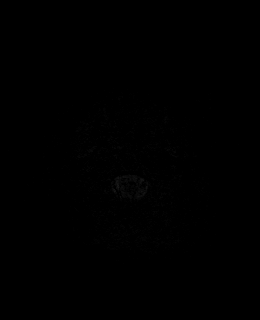

[Series 14: swi_images · axial · 3.0mm · 0.69mm/px · z∈[-10,+154]mm · 4 of 56 slices shown]
[im 1/56]
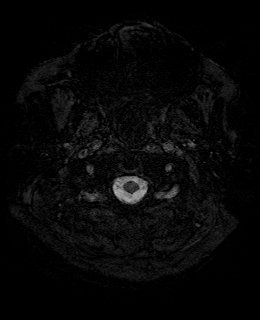
[im 19/56]
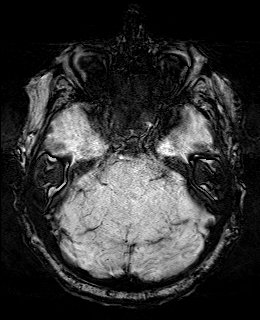
[im 37/56]
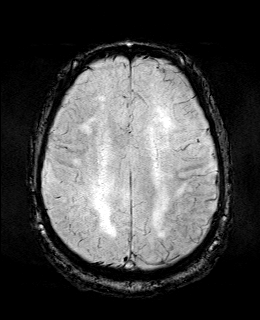
[im 56/56]
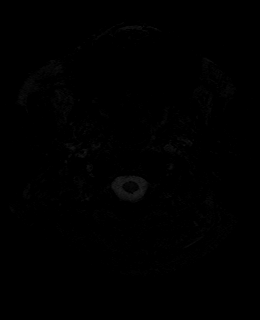

[Series 15: FLAIR · axial · 3.0mm · 0.69mm/px · z∈[-4,+148]mm · 4 of 52 slices shown]
[im 1/52]
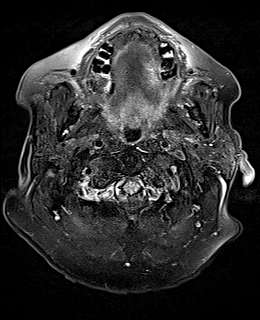
[im 18/52]
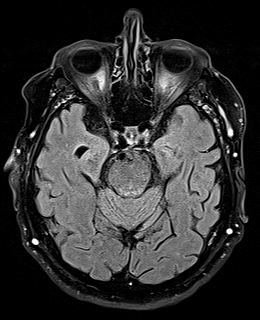
[im 35/52]
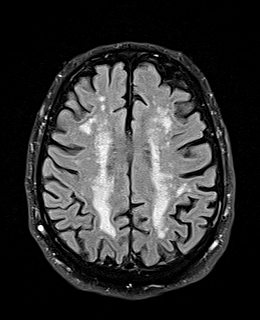
[im 52/52]
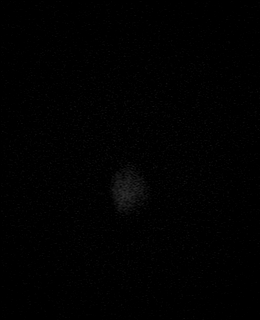

[Series 16: T1 · axial · 1.0mm · 0.86mm/px · z∈[-5,+153]mm · 12 of 160 slices shown (2 of 2)]
[im 1/160]
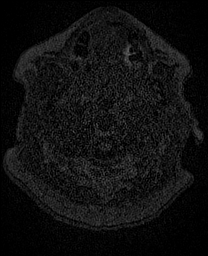
[im 15/160]
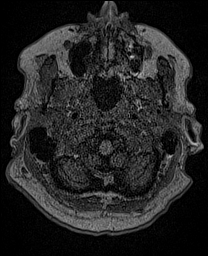
[im 29/160]
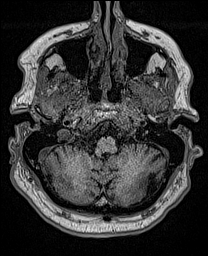
[im 44/160]
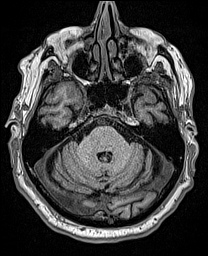
[im 58/160]
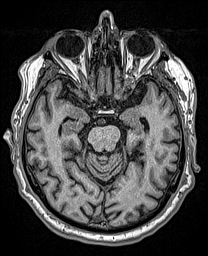
[im 73/160]
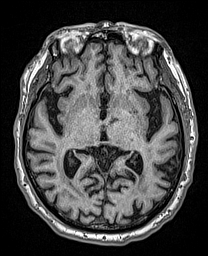
[im 87/160]
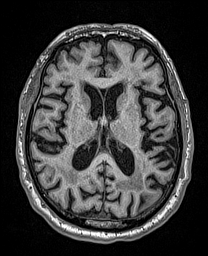
[im 102/160]
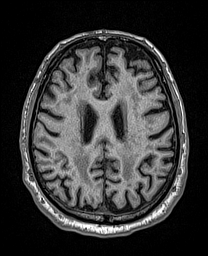
[im 116/160]
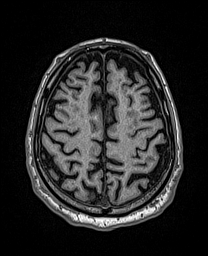
[im 131/160]
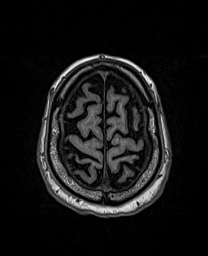
[im 145/160]
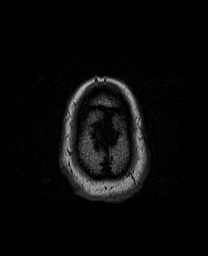
[im 160/160]
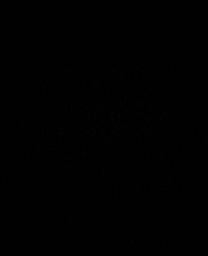

[Series 17: DWI · coronal · 5.0mm · 1.31mm/px · 6 of 72 slices shown (2 of 3)]
[im 1/72]
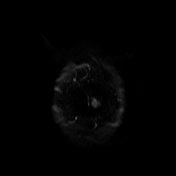
[im 15/72]
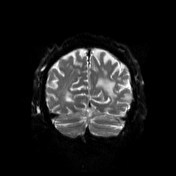
[im 29/72]
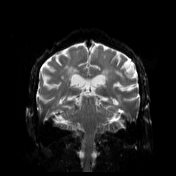
[im 43/72]
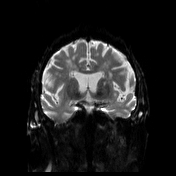
[im 57/72]
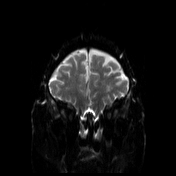
[im 72/72]
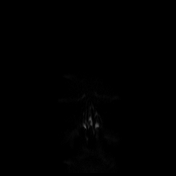

[Series 18: DWI · coronal · 5.0mm · 1.31mm/px · 3 of 36 slices shown (3 of 3)]
[im 1/36]
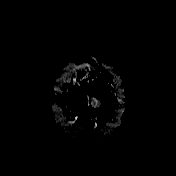
[im 18/36]
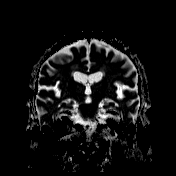
[im 36/36]
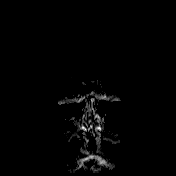

[Series 19: T2 · coronal · 5.0mm · 0.57mm/px · 3 of 35 slices shown (2 of 2)]
[im 1/35]
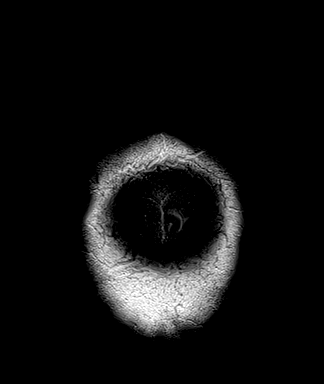
[im 18/35]
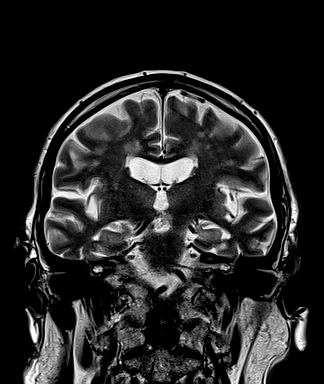
[im 35/35]
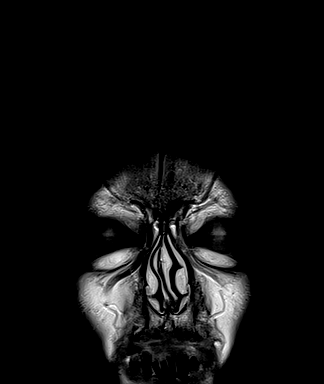

[48 of 48 positions shown; findings below may reference images not displayed]

FINDINGS: Brain: There is no acute infarction or intracranial hemorrhage.
There is no intracranial mass, mass effect, or edema. There is no
hydrocephalus or extra-axial fluid collection. Prominence of the
ventricles and sulci reflects generalized parenchymal volume loss.
Patchy and confluent areas of T2 hyperintensity in the
supratentorial white matter are nonspecific but may reflect moderate
to advanced chronic microvascular ischemic changes.

Vascular: Major vessel flow voids at the skull base are preserved.

Skull and upper cervical spine: Normal marrow signal is preserved.

Sinuses/Orbits: Minor mucosal thickening. Bilateral lens
replacements.

Other: Sella is unremarkable.  Mastoid air cells are clear.
IMPRESSION: No intracranial mass or acute abnormality.

Moderate to advanced chronic microvascular ischemic changes.

Generalized parenchymal volume loss.

## 2021-04-05 ENCOUNTER — Other Ambulatory Visit: Payer: Self-pay | Admitting: Family Medicine

## 2021-04-05 ENCOUNTER — Other Ambulatory Visit: Payer: Self-pay | Admitting: Neurology

## 2021-05-24 ENCOUNTER — Encounter: Payer: Self-pay | Admitting: Physician Assistant

## 2021-05-24 ENCOUNTER — Ambulatory Visit: Payer: Medicare HMO | Admitting: Physician Assistant

## 2021-05-24 VITALS — BP 150/74 | HR 70 | Resp 18 | Ht 66.0 in | Wt 174.0 lb

## 2021-05-24 DIAGNOSIS — R252 Cramp and spasm: Secondary | ICD-10-CM | POA: Diagnosis not present

## 2021-05-24 DIAGNOSIS — F0390 Unspecified dementia without behavioral disturbance: Secondary | ICD-10-CM

## 2021-05-24 DIAGNOSIS — R69 Illness, unspecified: Secondary | ICD-10-CM | POA: Diagnosis not present

## 2021-05-24 MED ORDER — DONEPEZIL HCL 10 MG PO TABS: ORAL_TABLET | ORAL | 1 refills | Status: DC

## 2021-05-24 NOTE — Patient Instructions (Signed)
Continue Donepezil (Aricept) 10 mg every morning ? ?2. Recommend driving evaluation: ?You can contact the following centers for evaluation ? ?[facility] in [place] ? ?[place] [phone] ? ?[facility] [phone] ? ?[facility] [phone] or [phone] ? ? ?3. Follow-up in 6 months, call for any changes. He has an appt w Dr, Delice Lesch already  ? ? ?FALL PRECAUTIONS: Be cautious when walking. Scan the area for obstacles that may increase the risk of trips and falls. When getting up in the mornings, sit up at the edge of the bed for a few minutes before getting out of bed. Consider elevating the bed at the head end to avoid drop of blood pressure when getting up. Walk always in a well-lit room (use night lights in the walls). Avoid area rugs or power cords from appliances in the middle of the walkways. Use a walker or a cane if necessary and consider physical therapy for balance exercise. Get your eyesight checked regularly. ? ?FINANCIAL OVERSIGHT: Supervision, especially oversight when making financial decisions or transactions is also recommended as difficulties arise. ? ?HOME SAFETY: Consider the safety of the kitchen when operating appliances like stoves, microwave oven, and blender. Consider having supervision and share cooking responsibilities until no longer able to participate in those. Accidents with firearms and other hazards in the house should be identified and addressed as well. ? ?DRIVING: Regarding driving, in patients with progressive memory problems, driving will be impaired. We advise to have someone else do the driving if trouble finding directions or if minor accidents are reported. Independent driving assessment is available to determine safety of driving. ? ?ABILITY TO BE LEFT ALONE: If patient is unable to contact 911 operator, consider using LifeLine, or when the need is there, arrange for someone to stay with patients.  Smoking is a fire hazard, consider supervision or cessation. Risk of wandering should be assessed by caregiver and if detected at any point, supervision and safe proof recommendations should be instituted. ? ?MEDICATION SUPERVISION: Inability to self-administer medication needs to be constantly addressed. Implement a mechanism to ensure safe administration of the medications. ? ?RECOMMENDATIONS FOR ALL PATIENTS WITH MEMORY PROBLEMS: ?1. Continue to exercise (Recommend 30 minutes of walking everyday, or 3 hours every week) ?2. Increase social interactions - continue going to [place] and enjoy social gatherings with friends and family ?3. Eat healthy, avoid fried foods and eat more fruits and vegetables ?4. Maintain adequate blood pressure, blood sugar, and blood cholesterol level. Reducing the risk of stroke and cardiovascular disease also helps promoting better memory. ?5. Avoid stressful situations. Live a simple life and avoid aggravations. Organize your time and prepare for the next day in anticipation. ?6. Sleep well, avoid any interruptions of sleep and avoid any distractions in the bedroom that may interfere with adequate sleep quality ?7. Avoid sugar, avoid sweets as there is a strong link between excessive sugar intake, diabetes, and cognitive impairment ?We discussed the Mediterranean diet, which has been shown to help patients reduce the risk of progressive memory disorders and reduces cardiovascular risk. This includes eating fish, eat fruits and green leafy vegetables, nuts like almonds and hazelnuts, walnuts, and also use olive oil. Avoid fast foods and fried foods as much as possible. Avoid sweets and sugar as sugar use has been linked to worsening of memory function. ? ? ?

## 2021-05-24 NOTE — Progress Notes (Signed)
? ?NEUROLOGY FOLLOW UP OFFICE NOTE ? ?Shawn [name] ?[id number] ?[birth year] ? ?HISTORY OF PRESENT ILLNESS: ?I had the pleasure of seeing Shawn [name] in follow-up in the neurology clinic on  05/24/20. He was last seen on 11/22/2020 for dementia. He is again accompanied by his wife who helps supplement the history today.  Records and images were personally reviewed where available.  He is on Donepezil 10 mg daily taking it in the morning helps him sleep better, but he still has vivid unpleasant dreams sometimes. His wife notes he reaches for something in his sleep sometimes. He feels his memory is "same as before", but his wife reports that he may be "a little more forgetful". He may repeat himself, but he argues "I just needs a little longer time to say what I need". Fidgeting is not as bad. He has some abdominal spasms being evaluated, unclear etiology." He continues to drive and denies getting lost, but "she hollers at me that I go too fast.". He does not wish to do the driving test.  She states she has noticed changes in his concentration and judgement. He manages medications and finances without difficulty. His wife notes slight personality changes, " he is less patient".  No paranoia or hallucinations. He denies any headaches, dizziness. Sleep is good with the night light, he feels rested. No falls. No hygiene concerns, no bowel or urine complaints.  ?  ? ?History on Initial Assessment 04/22/2020: This is an 87 year old right-handed man with a history of hyperlipidemia, PE and DVT, Factor V Leiden deficiency, presenting for evaluation of dementia. His wife is present to provide additional information. Records were reviewed, he underwent Neuropsychological evaluation in January 2022 with very significant cognitive impairment with profound visuospatial problems and additional low scores on measures of processing speed, executive function, and working memory. He had striking visuospatial  difficulties on constructional measures. 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 Atypical Alzheimer's is also a possibility. Vascular disease may be contributing but does not well explain his higher cortical signs. MRI brain in 2017 showed moderate chronic microvascular disease and mild diffuse volume loss. Repeat MRI brain in 2022 showed progression of chronic microvascular disease, no acute changes.  ? ?He feels his memory is good, "not perfect." His wife started noticing forgetfulness and word-finding difficulties a couple of years ago. His handwriting has always been terrible, a little more illegible than before. He continues to drive and denies getting lost driving. He manages his own medications and denies missing doses. They do bills together. His wife denies any difficulties following instructions or using the remote control/microwave. He is independent with dressing and bathing. No personality changes, mood is "same as always," no paranoia or hallucinations. ? ?They started noticing involuntary hand movements a few months ago. He has intermittent right arm myoclonus throughout the visit. They note he moves his fingers without realizing. Movements do not affect writing or using utensils. He has not noticed them in his legs. No falls. Sleep is good, no REM behavior disorder. He does fall asleep when he reads during the day. No staring/unresponsive episodes, gaps in time, olfactory/gustatory hallucinations. No change in gait, he denies any stiffness/difficulty with getting out of bed.  ? ?Laboratory Data: ?Lab Results  ?Component Value Date  ? TSH 1.73 01/11/2020  ? ?  Lab Results  ?Component Value Date  ? [id number] >1526 (H) 01/11/2020  ? ? ?PAST MEDICAL  HISTORY: ?Past Medical History:  ?Diagnosis Date  ? Allergy   ? Cardiac murmur   ? mild aortic sclerosis  ? Cataract   ? left eye  ? Clotting disorder ([place])   ? Factor v Leiden factor mutation  ? Colon polyps   ? Hyperplastic  ? Diverticulosis   ? Dysphagia   ? Factor V Leiden mutation ([place])   ? Hemorrhoids   ? History of blood clots   ? Hyperlipidemia   ? Osteoarthritis   ? ? ?MEDICATIONS: ?Current Outpatient Medications on File Prior to Visit  ?Medication Sig Dispense Refill  ? Cholecalciferol 25 MCG (1000 UT) capsule Take 5,000 Units by mouth daily.    ? Cyanocobalamin (VITAMIN B-12) 5000 MCG TBDP Take 1 tablet by mouth daily.    ? donepezil (ARICEPT) 10 MG tablet Take 1 tablet daily 60 tablet 0  ? ELIQUIS 5 MG TABS tablet TAKE 1 TABLET BY MOUTH TWICE A DAY 180 tablet 1  ? folic acid (FOLVITE) 527 MCG tablet Take 400 mcg by mouth daily.    ? Hyaluronic Acid-Vitamin C (HYALURONIC ACID PO) Take 2 tablets by mouth daily. (200 mg)    ? Multiple Vitamin (MULTIVITAMIN) tablet Take 1 tablet by mouth daily.    ? Omega-3 Fatty Acids (FISH OIL) 1000 MG CAPS Take 3,500 mg by mouth daily.    ? Saw Palmetto 450-15 MG CAPS Take 1 capsule by mouth daily. (Patient taking differently: Take 2 capsules by mouth daily.)    ? ?No current facility-administered medications on file prior to visit.  ? ? ?ALLERGIES: ?Allergies  ?Allergen Reactions  ? Niacin Rash  ?  "My whole body turned red on the prescription dose" He takes the OTC medication  ? Pravastatin Other (See Comments)  ?  Disorientation  ? ? ?FAMILY HISTORY: ?Family History  ?Problem Relation Age of Onset  ? Lung cancer Father   ? Heart attack Mother 58  ? Heart disease Mother   ?     chf  ? Arthritis Mother   ? Colon cancer Neg Hx   ? Esophageal cancer Neg Hx   ? Rectal cancer Neg Hx   ? Stomach cancer Neg Hx   ? ? ?SOCIAL HISTORY: ?Social History  ? ?Socioeconomic History  ? Marital status: Married  ?  Spouse name: Not on file  ? Number of children: 4  ? Years of education:  Not on file  ? Highest education level: Not on file  ?Occupational History  ? 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  Employer: RETIRED  ?Tobacco Use  ? Smoking status: Never  ? Smokeless tobacco: Never  ?Substance and Sexual Activity  ? Alcohol use: No  ?  Alcohol/week: 0.0 standard drinks  ? Drug use: No  ? Sexual activity: Yes  ?  Partners: Female  ?Other Topics Concern  ? Not on file  ?Social History Narrative  ? ** Merged History Encounter **  ?    ? ** Merged History Encounter **  ?    ? Daily caffeine   ? Exercise-- walk 1 mile a day and uses treadmill  ? Lives with wife.   ? Right handed  ? One story home  ? ?Social Determinants of Health  ? ?Financial Resource Strain: Low Risk   ? Difficulty of Paying Living Expenses: Not hard at all  ?Food Insecurity: No Food Insecurity  ? Worried  About Running Out of Food in the Last Year: Never true  ? Ran Out of Food in the Last Year: Never true  ?Transportation Needs: No Transportation Needs  ? Lack of Transportation (Medical): No  ? Lack of Transportation (Non-Medical): No  ?Physical Activity: Sufficiently Active  ? Days of Exercise per Week: 6 days  ? Minutes of Exercise per Session: 30 min  ?Stress: No Stress Concern Present  ? Feeling of Stress : Not at all  ?Social Connections: Socially Isolated  ? Frequency of Communication with Friends and Family: Once a week  ? Frequency of Social Gatherings with Friends and Family: Once a week  ? Attends Religious Services: Never  ? Active Member of Clubs or Organizations: No  ? Attends Archivist Meetings: Never  ? Marital Status: Married  ?Intimate Partner Violence: Not At Risk  ? Fear of Current or Ex-Partner: No  ? Emotionally Abused: No  ? Physically Abused: No  ? Sexually Abused: No  ? ? ? ?PHYSICAL EXAM: ?Vitals:  ? 05/24/21 1104  ?BP: (!) 150/74  ?Pulse: 70  ?Resp: 18  ?SpO2: 98%  ? ? ?  05/24/2021  ? 11:00 AM 12/22/2015  ? 10:02 AM  ?MMSE - Mini Mental State Exam  ?Orientation to time 4 5  ?Orientation to Place 4 5   ?Registration 3 3  ?Attention/ Calculation 5 5  ?Recall 1 3  ?Language- name 2 objects 2 2  ?Language- repeat 1 1  ?Language- follow 3 step command 3 3  ?Language- read & follow direction 1 1  ?Write a senten

## 2021-06-13 DIAGNOSIS — Z86718 Personal history of other venous thrombosis and embolism: Secondary | ICD-10-CM | POA: Diagnosis not present

## 2021-06-13 DIAGNOSIS — Z8249 Family history of ischemic heart disease and other diseases of the circulatory system: Secondary | ICD-10-CM | POA: Diagnosis not present

## 2021-06-13 DIAGNOSIS — Z86711 Personal history of pulmonary embolism: Secondary | ICD-10-CM | POA: Diagnosis not present

## 2021-06-13 DIAGNOSIS — N529 Male erectile dysfunction, unspecified: Secondary | ICD-10-CM | POA: Diagnosis not present

## 2021-06-13 DIAGNOSIS — R69 Illness, unspecified: Secondary | ICD-10-CM | POA: Diagnosis not present

## 2021-06-13 DIAGNOSIS — Z7901 Long term (current) use of anticoagulants: Secondary | ICD-10-CM | POA: Diagnosis not present

## 2021-06-13 DIAGNOSIS — Z008 Encounter for other general examination: Secondary | ICD-10-CM | POA: Diagnosis not present

## 2021-06-13 DIAGNOSIS — R030 Elevated blood-pressure reading, without diagnosis of hypertension: Secondary | ICD-10-CM | POA: Diagnosis not present

## 2021-07-07 ENCOUNTER — Ambulatory Visit (INDEPENDENT_AMBULATORY_CARE_PROVIDER_SITE_OTHER): Payer: Medicare HMO | Admitting: Family Medicine

## 2021-07-07 ENCOUNTER — Encounter: Payer: Self-pay | Admitting: Family Medicine

## 2021-07-07 VITALS — BP 110/72 | HR 67 | Temp 97.5°F | Resp 18 | Ht 66.0 in | Wt 174.0 lb

## 2021-07-07 DIAGNOSIS — H60391 Other infective otitis externa, right ear: Secondary | ICD-10-CM

## 2021-07-07 DIAGNOSIS — H6090 Unspecified otitis externa, unspecified ear: Secondary | ICD-10-CM

## 2021-07-07 DIAGNOSIS — H6121 Impacted cerumen, right ear: Secondary | ICD-10-CM

## 2021-07-07 HISTORY — DX: Unspecified otitis externa, unspecified ear: H60.90

## 2021-07-07 MED ORDER — OFLOXACIN 0.3 % OT SOLN: 10.0000 [drp] | Freq: Every day | OTIC | 1 refills | Status: DC

## 2021-07-07 NOTE — Progress Notes (Signed)
Subjective:   By signing my name below, I, Shawn [name], attest that this documentation has been prepared under the direction and in the presence of Roma Schanz DO, 07/07/2021    Patient ID: Shawn [name], male    DOB: 02-13-1934, 87 y.o.   MRN: [id number]  Chief Complaint  Patient presents with   Ear Fullness    Both ears, no pain.     Ear Fullness  Pertinent negatives include no coughing, headaches, rash or vomiting.  Patient is in today for an office visit  He complains of an right ear pain and fullness.   Past Medical History:  Diagnosis Date   Allergy    Cardiac murmur    mild aortic sclerosis   Cataract    left eye   Clotting disorder ([facility])    Factor v Leiden factor mutation   Colon polyps    Hyperplastic   Diverticulosis    Dysphagia    Factor V Leiden mutation ([place])    Hemorrhoids    History of blood clots    Hyperlipidemia    Osteoarthritis     Past Surgical History:  Procedure Laterality Date   APPENDECTOMY     CATARACT EXTRACTION  02/09/2007   right eye   INGUINAL HERNIA REPAIR     TONSILLECTOMY     VARICOSE VEIN SURGERY     VASECTOMY      Family History  Problem Relation Age of Onset   Lung cancer Father    Heart attack Mother 21   Heart disease Mother        chf   Arthritis Mother    Colon cancer Neg Hx    Esophageal cancer Neg Hx    Rectal cancer Neg Hx    Stomach cancer Neg Hx     Social History   Socioeconomic History   Marital status: Married    Spouse name: Not on file   Number of children: 4   Years of education: Not on file   Highest education level: Not on file  Occupational History   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    Right handed   One story home   Social Determinants of Health   Financial Resource Strain: Low Risk    Difficulty of Paying Living Expenses: Not hard at all  Food Insecurity: No Food Insecurity   Worried About Charity fundraiser in the Last Year: Never true   Ran Out of Food in the Last Year: Never true  Transportation Needs: No Transportation Needs   Lack of Transportation (Medical): No   Lack of Transportation (Non-Medical): No  Physical Activity: Sufficiently Active   Days of Exercise per Week: 6 days   Minutes of Exercise per Session: 30 min  Stress: No Stress Concern Present   Feeling of Stress : Not at all  Social Connections: Socially Isolated  Frequency of Communication with Friends and Family: Once a week   Frequency of Social Gatherings with Friends and Family: Once a week   Attends Religious Services: Never   Marine scientist or Organizations: No   Attends Music therapist: Never   Marital Status: Married  Human resources officer Violence: Not At Risk   Fear of Current or Ex-Partner: No   Emotionally Abused: No   Physically Abused: No   Sexually Abused: No    Outpatient Medications Prior to Visit  Medication Sig Dispense Refill   Cholecalciferol 25 MCG (1000 UT) capsule Take 5,000 Units by mouth daily.     Cyanocobalamin (VITAMIN B-12) 5000 MCG TBDP Take 1 tablet by mouth daily.     donepezil (ARICEPT) 10 MG tablet Take 1 tablet daily 180 tablet 1   ELIQUIS 5 MG TABS tablet TAKE 1 TABLET BY MOUTH TWICE A DAY 539 tablet 1   folic acid (FOLVITE) 767 MCG tablet Take 400 mcg by mouth daily.     Hyaluronic Acid-Vitamin C (HYALURONIC ACID PO) Take 2 tablets by mouth daily. (200 mg)     Multiple Vitamin (MULTIVITAMIN) tablet Take 1 tablet by mouth daily.     Omega-3 Fatty  Acids (FISH OIL) 1000 MG CAPS Take 3,500 mg by mouth daily.     Saw Palmetto 450-15 MG CAPS Take 1 capsule by mouth daily. (Patient taking differently: Take 2 capsules by mouth daily.)     No facility-administered medications prior to visit.    Allergies  Allergen Reactions   Niacin Rash    "My whole body turned red on the prescription dose" He takes the OTC medication   Pravastatin Other (See Comments)    Disorientation    Review of Systems  Constitutional:  Negative for fever and malaise/fatigue.  HENT:  Negative for congestion.        (+)fullness in right ear (+)cerumen in right ear  Eyes:  Negative for blurred vision.  Respiratory:  Negative for cough and shortness of breath.   Cardiovascular:  Negative for chest pain, palpitations and leg swelling.  Gastrointestinal:  Negative for vomiting.  Musculoskeletal:  Negative for back pain.  Skin:  Negative for rash.  Neurological:  Negative for loss of consciousness and headaches.      Objective:    Physical Exam Vitals and nursing note reviewed.  Constitutional:      Appearance: Normal appearance.  HENT:     Head: Normocephalic and atraumatic.     Right Ear: External ear normal. There is impacted cerumen. Tympanic membrane is erythematous.     Left Ear: Tympanic membrane, ear canal and external ear normal.     Ears:     Comments: Redness in right ear canal after irrigation  Ear irrigated with no complication  Eyes:     Extraocular Movements: Extraocular movements intact.     Pupils: Pupils are equal, round, and reactive to light.  Cardiovascular:     Rate and Rhythm: Normal rate and regular rhythm.     Heart sounds: Normal heart sounds. No murmur heard.   No gallop.  Pulmonary:     Effort: Pulmonary effort is normal.     Breath sounds: Normal breath sounds.  Skin:    General: Skin is warm and dry.  Neurological:     Mental Status: He is oriented to person, place, and time.  Psychiatric:        Judgment: Judgment  normal.    BP 110/72 (BP Location: Left  Arm, Patient Position: Sitting, Cuff Size: Normal)   Pulse 67   Temp (!) 97.5 F (36.4 C) (Oral)   Resp 18   Ht '5\' 6"'$  (1.676 m)   Wt 174 lb (78.9 kg)   SpO2 94%   BMI 28.08 kg/m  Wt Readings from Last 3 Encounters:  07/07/21 174 lb (78.9 kg)  05/24/21 174 lb (78.9 kg)  01/12/21 177 lb 3.2 oz (80.4 kg)    Diabetic Foot Exam - Simple   No data filed    Lab Results  Component Value Date   WBC 3.6 (L) 01/12/2021   HGB 16.7 01/12/2021   HCT 48.0 01/12/2021   PLT 133.0 (L) 01/12/2021   GLUCOSE 96 01/12/2021   CHOL 184 01/12/2021   TRIG 149.0 01/12/2021   HDL 39.00 (L) 01/12/2021   LDLDIRECT 86.0 06/17/2014   LDLCALC 115 (H) 01/12/2021   ALT 16 01/12/2021   AST 20 01/12/2021   NA 140 01/12/2021   K 4.0 01/12/2021   CL 101 01/12/2021   CREATININE 1.03 01/12/2021   BUN 14 01/12/2021   CO2 33 (H) 01/12/2021   TSH 1.73 01/11/2020   PSA 3.65 01/12/2021   INR 2.3 10/04/2020   MICROALBUR 0.7 11/15/2014    Lab Results  Component Value Date   TSH 1.73 01/11/2020   Lab Results  Component Value Date   WBC 3.6 (L) 01/12/2021   HGB 16.7 01/12/2021   HCT 48.0 01/12/2021   MCV 97.7 01/12/2021   PLT 133.0 (L) 01/12/2021   Lab Results  Component Value Date   NA 140 01/12/2021   K 4.0 01/12/2021   CO2 33 (H) 01/12/2021   GLUCOSE 96 01/12/2021   BUN 14 01/12/2021   CREATININE 1.03 01/12/2021   BILITOT 1.8 (H) 01/12/2021   ALKPHOS 47 01/12/2021   AST 20 01/12/2021   ALT 16 01/12/2021   PROT 6.9 01/12/2021   ALBUMIN 4.2 01/12/2021   CALCIUM 9.7 01/12/2021   GFR 66.95 01/12/2021   Lab Results  Component Value Date   CHOL 184 01/12/2021   Lab Results  Component Value Date   HDL 39.00 (L) 01/12/2021   Lab Results  Component Value Date   LDLCALC 115 (H) 01/12/2021   Lab Results  Component Value Date   TRIG 149.0 01/12/2021   Lab Results  Component Value Date   CHOLHDL 5 01/12/2021   No results found for:  HGBA1C     Assessment & Plan:   Problem List Items Addressed This Visit       Unprioritized   Otitis externa - Primary    floxin otic gtts        Relevant Medications   ofloxacin (FLOXIN OTIC) 0.3 % OTIC solution   CERUMEN IMPACTION, RIGHT    Unable to use hoop Irrigated successfully with no complication after pt consent           Meds ordered this encounter  Medications   ofloxacin (FLOXIN OTIC) 0.3 % OTIC solution    Sig: Place 10 drops into the right ear daily.    Dispense:  10 mL    Refill:  1    I, Ann Held, DO, personally preformed the services described in this documentation.  All medical record entries made by the scribe were at my direction and in my presence.  I have reviewed the chart and discharge instructions (if applicable) and agree that the record reflects my personal performance and is accurate and complete. 06/10/2021   I,Amber  Collins,acting as a Education administrator for [employer], DO.,have documented all relevant documentation on the behalf of Ann Held, DO,as directed by  Ann Held, DO while in the presence of Ann Held, DO.   Ann Held, DO

## 2021-07-07 NOTE — Assessment & Plan Note (Signed)
Unable to use hoop Irrigated successfully with no complication after pt consent

## 2021-07-07 NOTE — Patient Instructions (Signed)
Ear Irrigation Ear irrigation is a procedure to wash dirt and wax out of your ear canal. This procedure is also called lavage. You may need ear irrigation if you are having trouble hearing because of a buildup of earwax. You may also have ear irrigation as part of the treatment for an ear infection. Getting wax and dirt out of your ear canal can help ear drops work better. Tell a health care provider about: Any allergies you have. All medicines you are taking, including vitamins, herbs, eye drops, creams, and over-the-counter medicines. Any problems you or family members have had with anesthetic medicines. Any blood disorders you have. Any surgeries you have had. This includes any ear surgeries. Any medical conditions you have. Whether you are pregnant or may be pregnant. What are the risks? Generally, this is a safe procedure. However, problems may occur, including: Infection. Pain. Hearing loss. Fluid and debris being pushed through the eardrum and into the middle ear. This can occur if there are holes in the eardrum. Ear irrigation failing to work. What happens before the procedure? You will talk with your provider about the procedure and plan. You may be given ear drops to put in your ear 15-20 minutes before irrigation. This helps loosen the wax. What happens during the procedure?  A syringe is filled with water or saline solution, which is made of salt and water. The syringe is gently inserted into the ear canal. The fluid is used to flush out wax and other debris. The procedure may vary among health care providers and hospitals. What can I expect after the procedure? After an ear irrigation, follow instructions given to you by your health care provider. Follow these instructions at home: Using ear irrigation kits Ear irrigation kits are available for use at home. Ask your health care provider if this is an option for you. In general, you should: Use a home irrigation kit only as  told by your health care provider. Read the package instructions carefully. Follow the directions for using the syringe. Use water that is room temperature. Do not do ear irrigation at home if you: Have diabetes. Diabetes increases the risk of infection. Have a hole or tear in your eardrum. Have tubes in your ears. Have had any ear surgery in the past. Have been told not to irrigate your ears. Cleaning your ears  Clean the outside of your ear with a soft washcloth daily. If told by your health care provider, use a few drops of baby oil, mineral oil, glycerin, hydrogen peroxide, or over-the-counter earwax softening drops. Do not use cotton swabs to clean your ears. These can push wax down into the ear canal. Do not put anything into your ears to try to remove wax. This includes ear candles. General instructions Take over-the-counter and prescription medicines only as told by your health care provider. If you were prescribed an antibiotic medicine, use it as told by your health care provider. Do not stop using the antibiotic even if your condition improves. Keep the ear clean and dry by following the instructions from your health care provider. Keep all follow-up visits. This is important. Visit your health care provider at least once a year to have your ears and hearing checked. Contact a health care provider if: Your hearing is not improving or is getting worse. You have pain or redness in your ear. You are dizzy. You have ringing in your ears. You have nausea or vomiting. You have fluid, blood, or pus coming out  of your ear. Summary Ear irrigation is a procedure to wash dirt and wax out of your ear canal. This procedure is also called lavage. To perform ear irrigation, ear drops may be put in your ear 15-20 minutes before irrigation. Water or saline solution will be used to flush out earwax and other debris. You may be able to irrigate your ears at home. Ask your health care provider  if this is an option for you. Follow your health care provider's instructions. Clean your ears with a soft cloth after irrigation. Do not use cotton swabs to clean your ears. These can push wax down into the ear canal. This information is not intended to replace advice given to you by your health care provider. Make sure you discuss any questions you have with your health care provider. Document Revised: 05/12/2019 Document Reviewed: 05/12/2019 Elsevier Patient Education  2023 [employer]  

## 2021-07-07 NOTE — Assessment & Plan Note (Signed)
floxin otic gtts

## 2021-08-03 ENCOUNTER — Other Ambulatory Visit: Payer: Self-pay

## 2021-08-03 ENCOUNTER — Encounter (HOSPITAL_BASED_OUTPATIENT_CLINIC_OR_DEPARTMENT_OTHER): Payer: Self-pay

## 2021-08-03 ENCOUNTER — Emergency Department (HOSPITAL_BASED_OUTPATIENT_CLINIC_OR_DEPARTMENT_OTHER): Payer: Medicare HMO

## 2021-08-03 ENCOUNTER — Emergency Department (HOSPITAL_BASED_OUTPATIENT_CLINIC_OR_DEPARTMENT_OTHER)
Admission: EM | Admit: 2021-08-03 | Discharge: 2021-08-03 | Disposition: A | Discharge status: Home / Self Care | Payer: Medicare HMO | Attending: Emergency Medicine | Admitting: Emergency Medicine

## 2021-08-03 DIAGNOSIS — F0390 Unspecified dementia without behavioral disturbance: Secondary | ICD-10-CM | POA: Insufficient documentation

## 2021-08-03 DIAGNOSIS — S40011A Contusion of right shoulder, initial encounter: Secondary | ICD-10-CM | POA: Diagnosis not present

## 2021-08-03 DIAGNOSIS — W109XXA Fall (on) (from) unspecified stairs and steps, initial encounter: Secondary | ICD-10-CM | POA: Insufficient documentation

## 2021-08-03 DIAGNOSIS — S20211A Contusion of right front wall of thorax, initial encounter: Secondary | ICD-10-CM | POA: Insufficient documentation

## 2021-08-03 DIAGNOSIS — S4991XA Unspecified injury of right shoulder and upper arm, initial encounter: Secondary | ICD-10-CM | POA: Diagnosis not present

## 2021-08-03 DIAGNOSIS — I10 Essential (primary) hypertension: Secondary | ICD-10-CM | POA: Insufficient documentation

## 2021-08-03 DIAGNOSIS — S40012A Contusion of left shoulder, initial encounter: Secondary | ICD-10-CM | POA: Insufficient documentation

## 2021-08-03 DIAGNOSIS — R011 Cardiac murmur, unspecified: Secondary | ICD-10-CM | POA: Diagnosis not present

## 2021-08-03 DIAGNOSIS — S0990XA Unspecified injury of head, initial encounter: Secondary | ICD-10-CM | POA: Diagnosis not present

## 2021-08-03 DIAGNOSIS — R0789 Other chest pain: Secondary | ICD-10-CM | POA: Diagnosis not present

## 2021-08-03 DIAGNOSIS — S199XXA Unspecified injury of neck, initial encounter: Secondary | ICD-10-CM | POA: Diagnosis not present

## 2021-08-03 DIAGNOSIS — Z7901 Long term (current) use of anticoagulants: Secondary | ICD-10-CM | POA: Diagnosis not present

## 2021-08-03 DIAGNOSIS — W19XXXA Unspecified fall, initial encounter: Secondary | ICD-10-CM

## 2021-08-03 DIAGNOSIS — R0781 Pleurodynia: Secondary | ICD-10-CM | POA: Diagnosis not present

## 2021-08-03 DIAGNOSIS — R69 Illness, unspecified: Secondary | ICD-10-CM | POA: Diagnosis not present

## 2021-08-03 NOTE — ED Notes (Signed)
Patient transported to CT 

## 2021-08-03 NOTE — ED Notes (Signed)
ED Provider at bedside. 

## 2021-08-03 NOTE — ED Triage Notes (Signed)
[place] travelling [place], last Friday fell backward down cellar stairs, hit head right front scalp.  Small abrasion, healing, bruising. Ambulated to room with steady gait, denies vision changes, does report increased drowsiness.  Pt is awake, alert x4.  Moving all extremities.

## 2021-08-03 NOTE — ED Triage Notes (Signed)
Pt also reports right back/flank pain with ambulation and pain with deep breathing

## 2021-08-03 NOTE — ED Provider Notes (Signed)
Shawn [name]   CSN: [id number] Arrival date & time: 08/03/21  5102  History  Chief Complaint  Patient presents with   Fall    On thinners   Shawn [name] is a 87 y.o. male.  87 year old male presents with his wife today for evaluation after a fall down some stairs.  Fall occurred last Friday, he fell down the stairs to the cellar, resulting in bruising over his bilateral shoulders, right lower lateral ribs, and a laceration to the crown of his head.  Wife has been concerned since then because she reports he has been sleeping much more and much more lethargic.  Daughter is a PA and made him come in today.  He does report rib pain with deep inspiration and impaired ambulation due to hip pain after the fall.  PMH includes factor V deficiency, DVT and PE on Eliquis, mild AS, neurodegenerative dementia, HTN, HLD.    Home Medications Prior to Admission medications   Medication Sig Start Date End Date Taking? Authorizing Provider  Cholecalciferol 25 MCG (1000 UT) capsule Take 5,000 Units by mouth daily.    [provider]  Cyanocobalamin (VITAMIN B-12) 5000 MCG TBDP Take 1 tablet by mouth daily.    [provider]  donepezil (ARICEPT) 10 MG tablet Take 1 tablet daily 05/24/21   Shawn Route, Coralee Pesa, PA-C  ELIQUIS 5 MG TABS tablet TAKE 1 TABLET BY MOUTH TWICE A DAY 04/05/21   Carollee Herter, Alferd Apa, DO  folic acid (FOLVITE) 585 MCG tablet Take 400 mcg by mouth daily.    [provider]  Hyaluronic Acid-Vitamin C (HYALURONIC ACID PO) Take 2 tablets by mouth daily. (200 mg)    [provider]  Multiple Vitamin (MULTIVITAMIN) tablet Take 1 tablet by mouth daily.    [provider]  ofloxacin (FLOXIN OTIC) 0.3 % OTIC solution Place 10 drops into the right ear daily. 07/07/21   Ann Held, DO  Omega-3 Fatty Acids (FISH OIL) 1000 MG CAPS Take 3,500 mg by mouth daily.    [provider]  Saw  Palmetto 450-15 MG CAPS Take 1 capsule by mouth daily. Patient taking differently: Take 2 capsules by mouth daily. 12/27/17   Ann Held, DO     Allergies    Niacin and Pravastatin    Review of Systems   Review of Systems  Constitutional:  Positive for activity change.  HENT:  Negative for facial swelling and nosebleeds.   Eyes:  Negative for photophobia, redness and visual disturbance.  Respiratory:  Negative for chest tightness.   Gastrointestinal:  Negative for abdominal pain and blood in stool.  Genitourinary:  Positive for flank pain.  Musculoskeletal:  Positive for back pain and gait problem (2/2 hip pain). Negative for neck pain.  Neurological:  Negative for seizures, speech difficulty, weakness, light-headedness and headaches.    Physical Exam Updated Vital Signs BP 129/76   Pulse 81   Temp 97.8 F (36.6 C) (Oral)   Resp 16   Ht '5\' 6"'$  (1.676 m)   Wt 79 kg   SpO2 95%   BMI 28.11 kg/m  Physical Exam Vitals and nursing [name] reviewed.  Constitutional:      General: He is not in acute distress.    Appearance: Normal appearance. He is normal weight. He is not ill-appearing, toxic-appearing or diaphoretic.  HENT:     Head: Normocephalic.     Nose: Nose normal.  Mouth/Throat:     Mouth: Mucous membranes are moist.  Eyes:     General: No scleral icterus.    Conjunctiva/sclera: Conjunctivae normal.     Pupils: Pupils are equal, round, and reactive to light.  Cardiovascular:     Rate and Rhythm: Normal rate and regular rhythm.     Heart sounds: Murmur (systolic murmur 3/5) heard.  Pulmonary:     Effort: Pulmonary effort is normal.     Breath sounds: Normal breath sounds.  Musculoskeletal:     Cervical back: Normal range of motion.  Skin:    General: Skin is warm.     Findings: Bruising (yellowed bruising over bilateral shoulders, right inferior lateral ribs) present.  Neurological:     Mental Status: He is alert and oriented to person, place, and  time. Mental status is at baseline.     Cranial Nerves: No cranial nerve deficit.     Motor: No weakness.     Coordination: Coordination normal.     Gait: Gait normal.     ED Results / Procedures / Treatments   Labs (all labs ordered are listed, but only abnormal results are displayed) Labs Reviewed - No data to display  EKG None  Radiology CT Chest Wo Contrast  Result Date: 08/03/2021 CLINICAL DATA:  87 year old male with history of trauma from a fall complaining of right-sided rib pain. EXAM: CT CHEST WITHOUT CONTRAST TECHNIQUE: Multidetector CT imaging of the chest was performed following the standard protocol without IV contrast. RADIATION DOSE REDUCTION: This exam was performed according to the departmental dose-optimization program which includes automated exposure control, adjustment of the mA and/or kV according to patient size and/or use of iterative reconstruction technique. COMPARISON:  Chest CTA 11/20/2009. FINDINGS: Cardiovascular: Heart size is normal. There is no significant pericardial fluid, thickening or pericardial calcification. There is aortic atherosclerosis, as well as atherosclerosis of the great vessels of the mediastinum and the coronary arteries, including calcified atherosclerotic plaque in the left main, left anterior descending, left circumflex and right coronary arteries. Severe calcifications of the aortic valve. Mediastinum/Nodes: No pathologically enlarged mediastinal or hilar lymph nodes. Please [name] that accurate exclusion of hilar adenopathy is limited on noncontrast CT scans. Esophagus is unremarkable in appearance. No axillary lymphadenopathy. Lungs/Pleura: No acute consolidative airspace disease. No pleural effusions. No suspicious appearing pulmonary nodules or masses are noted. Areas of ground-glass attenuation, septal thickening, regional architectural distortion and mild subpleural reticulation are noted in the lung bases bilaterally. Upper Abdomen:  Aortic atherosclerosis. Low-attenuation lesions in the liver, incompletely characterized on today's non-contrast CT examination, but statistically likely cysts, largest of which measures up to 2.4 cm in diameter in segment 2. Numerous coarse calcifications scattered throughout the visualized pancreatic parenchyma, presumably sequela of prior episodes of pancreatitis. 1 cm rim calcified structure adjacent to the splenic hilum, presumably a small splenic artery aneurysm. Musculoskeletal: There are no acute displaced fractures or aggressive appearing lytic or blastic lesions noted in the visualized portions of the skeleton. IMPRESSION: 1. No evidence of significant acute traumatic injury to the thorax. 2. The appearance of the lungs may suggest interstitial lung disease. Outpatient referral to Pulmonology for further clinical evaluation is recommended. At this time, imaging findings are considered indeterminate for usual interstitial pneumonia (UIP) per current ATS guidelines. 3. Aortic atherosclerosis, in addition to left main and three-vessel coronary artery disease. 4. There are severe calcifications of the aortic valve. Echocardiographic correlation for evaluation of potential valvular dysfunction may be warranted if clinically indicated. 5. Additional  incidental findings, as above. Aortic Atherosclerosis (ICD10-I70.0). Electronically Signed   By: Vinnie Langton M.D.   On: 08/03/2021 08:11   CT Head Wo Contrast  Result Date: 08/03/2021 CLINICAL DATA:  Head trauma, minor (Age >= 65y); Neck trauma (Age >= 65y) EXAM: CT HEAD WITHOUT CONTRAST CT CERVICAL SPINE WITHOUT CONTRAST TECHNIQUE: Multidetector CT imaging of the head and cervical spine was performed following the standard protocol without intravenous contrast. Multiplanar CT image reconstructions of the cervical spine were also generated. RADIATION DOSE REDUCTION: This exam was performed according to the departmental dose-optimization program which  includes automated exposure control, adjustment of the mA and/or kV according to patient size and/or use of iterative reconstruction technique. COMPARISON:  None Available. FINDINGS: CT HEAD FINDINGS Brain: No evidence of acute infarction, hemorrhage, hydrocephalus, extra-axial collection or mass lesion/mass effect. Patchy white matter hypoattenuation, nonspecific but compatible with chronic microvascular ischemic disease. Vascular: Calcific intracranial atherosclerosis. Skull: No acute fracture. Sinuses/Orbits: Clear sinuses.  No acute orbital findings. Other: No mastoid effusions. CT CERVICAL SPINE FINDINGS Alignment: Straightening of the normal cervical lordosis. No substantial sagittal subluxation. Skull base and vertebrae: Vertebral body heights are maintained. No evidence of acute fracture. Osteopenia. Soft tissues and spinal canal: No prevertebral fluid or swelling. No visible canal hematoma. Disc levels:  Moderate multilevel degenerative change. Upper chest: Visualized lung apices are clear. IMPRESSION: 1. No evidence of acute intracranial abnormality. 2. No evidence of acute fracture or traumatic malalignment in the cervical spine. Electronically Signed   By: Margaretha Sheffield M.D.   On: 08/03/2021 08:10   CT Cervical Spine Wo Contrast  Result Date: 08/03/2021 CLINICAL DATA:  Head trauma, minor (Age >= 65y); Neck trauma (Age >= 65y) EXAM: CT HEAD WITHOUT CONTRAST CT CERVICAL SPINE WITHOUT CONTRAST TECHNIQUE: Multidetector CT imaging of the head and cervical spine was performed following the standard protocol without intravenous contrast. Multiplanar CT image reconstructions of the cervical spine were also generated. RADIATION DOSE REDUCTION: This exam was performed according to the departmental dose-optimization program which includes automated exposure control, adjustment of the mA and/or kV according to patient size and/or use of iterative reconstruction technique. COMPARISON:  None Available.  FINDINGS: CT HEAD FINDINGS Brain: No evidence of acute infarction, hemorrhage, hydrocephalus, extra-axial collection or mass lesion/mass effect. Patchy white matter hypoattenuation, nonspecific but compatible with chronic microvascular ischemic disease. Vascular: Calcific intracranial atherosclerosis. Skull: No acute fracture. Sinuses/Orbits: Clear sinuses.  No acute orbital findings. Other: No mastoid effusions. CT CERVICAL SPINE FINDINGS Alignment: Straightening of the normal cervical lordosis. No substantial sagittal subluxation. Skull base and vertebrae: Vertebral body heights are maintained. No evidence of acute fracture. Osteopenia. Soft tissues and spinal canal: No prevertebral fluid or swelling. No visible canal hematoma. Disc levels:  Moderate multilevel degenerative change. Upper chest: Visualized lung apices are clear. IMPRESSION: 1. No evidence of acute intracranial abnormality. 2. No evidence of acute fracture or traumatic malalignment in the cervical spine. Electronically Signed   By: Margaretha Sheffield M.D.   On: 08/03/2021 08:10    Procedures Procedures   Medications Ordered in ED Medications - No data to display  ED Course/ Medical Decision Making/ A&P                           Medical Decision Making 87 year old male on Eliquis presents after fall downstairs 1 week ago.  Given concern for increased sleepiness and lethargy, obtained CT head, C-spine, and chest.  Differential includes brain bleed, C-spine fracture, rib  fracture.  All imaging returned negative.  Given stable vital signs and pain under control with OTC medications, patient is safe for discharge.  Return precautions discussed, see AVS for more.  Recommend follow-up with PCP within a week.  Amount and/or Complexity of Data Reviewed Independent Historian: spouse    Details: Wife at bedside. Radiology: ordered.    Details: CT head, C-spine, chest  Final Clinical Impression(s) / ED Diagnoses Final diagnoses:  Fall,  initial encounter   Rx / DC Orders ED Discharge Orders     None      Ezequiel Essex, MD   Ezequiel Essex, MD 59/16/38 [id number]    Gray, [place], DO 99/35/70 [phone]

## 2021-08-03 NOTE — Discharge Instructions (Addendum)
Today you were seen in the ED for evaluation after a fall.  We obtained CT scans of your head, neck, and chest.  The good news is that they all turned out negative.  We did not see any evidence of a head bleed, fracture, or other acute problem.   Keep using the pain regimen you have been at home as a seems to be working quite well.  Follow-up with your primary care doctor within a week for further evaluation.  Of course, please come back to the emergency department immediately if you have any concerns such as worsening symptoms, lethargy, or difficulty moving your arms or legs.

## 2021-08-09 ENCOUNTER — Emergency Department (HOSPITAL_BASED_OUTPATIENT_CLINIC_OR_DEPARTMENT_OTHER): Payer: Medicare HMO

## 2021-08-09 ENCOUNTER — Observation Stay (HOSPITAL_BASED_OUTPATIENT_CLINIC_OR_DEPARTMENT_OTHER)
Admission: EM | Admit: 2021-08-09 | Discharge: 2021-08-11 | Disposition: A | Discharge status: Home / Self Care | Payer: Medicare HMO | Attending: Family Medicine | Admitting: Family Medicine

## 2021-08-09 ENCOUNTER — Other Ambulatory Visit: Payer: Self-pay

## 2021-08-09 ENCOUNTER — Encounter (HOSPITAL_BASED_OUTPATIENT_CLINIC_OR_DEPARTMENT_OTHER): Payer: Self-pay

## 2021-08-09 DIAGNOSIS — Z86718 Personal history of other venous thrombosis and embolism: Secondary | ICD-10-CM | POA: Diagnosis not present

## 2021-08-09 DIAGNOSIS — R270 Ataxia, unspecified: Principal | ICD-10-CM

## 2021-08-09 DIAGNOSIS — G459 Transient cerebral ischemic attack, unspecified: Secondary | ICD-10-CM

## 2021-08-09 DIAGNOSIS — R2689 Other abnormalities of gait and mobility: Secondary | ICD-10-CM | POA: Diagnosis present

## 2021-08-09 DIAGNOSIS — Z86711 Personal history of pulmonary embolism: Secondary | ICD-10-CM | POA: Diagnosis not present

## 2021-08-09 DIAGNOSIS — I639 Cerebral infarction, unspecified: Secondary | ICD-10-CM | POA: Diagnosis not present

## 2021-08-09 DIAGNOSIS — R29818 Other symptoms and signs involving the nervous system: Secondary | ICD-10-CM | POA: Diagnosis not present

## 2021-08-09 DIAGNOSIS — F03A0 Unspecified dementia, mild, without behavioral disturbance, psychotic disturbance, mood disturbance, and anxiety: Secondary | ICD-10-CM | POA: Insufficient documentation

## 2021-08-09 DIAGNOSIS — Z79899 Other long term (current) drug therapy: Secondary | ICD-10-CM | POA: Insufficient documentation

## 2021-08-09 DIAGNOSIS — I10 Essential (primary) hypertension: Secondary | ICD-10-CM | POA: Diagnosis not present

## 2021-08-09 DIAGNOSIS — Z7901 Long term (current) use of anticoagulants: Secondary | ICD-10-CM | POA: Diagnosis not present

## 2021-08-09 DIAGNOSIS — R69 Illness, unspecified: Secondary | ICD-10-CM | POA: Diagnosis not present

## 2021-08-09 DIAGNOSIS — I63233 Cerebral infarction due to unspecified occlusion or stenosis of bilateral carotid arteries: Secondary | ICD-10-CM | POA: Diagnosis not present

## 2021-08-09 DIAGNOSIS — E785 Hyperlipidemia, unspecified: Secondary | ICD-10-CM | POA: Diagnosis present

## 2021-08-09 DIAGNOSIS — F0390 Unspecified dementia without behavioral disturbance: Secondary | ICD-10-CM | POA: Diagnosis present

## 2021-08-09 DIAGNOSIS — F0280 Dementia in other diseases classified elsewhere without behavioral disturbance: Secondary | ICD-10-CM | POA: Diagnosis present

## 2021-08-09 HISTORY — DX: Ataxia, unspecified: R27.0

## 2021-08-09 LAB — RAPID URINE DRUG SCREEN, HOSP PERFORMED
Amphetamines: NOT DETECTED
Barbiturates: NOT DETECTED
Benzodiazepines: NOT DETECTED
Cocaine: NOT DETECTED
Opiates: NOT DETECTED
Tetrahydrocannabinol: NOT DETECTED

## 2021-08-09 LAB — DIFFERENTIAL
Abs Immature Granulocytes: 0.01 10*3/uL (ref 0.00–0.07)
Basophils Absolute: 0 10*3/uL (ref 0.0–0.1)
Basophils Relative: 1 %
Eosinophils Absolute: 0.2 10*3/uL (ref 0.0–0.5)
Eosinophils Relative: 4 %
Immature Granulocytes: 0 %
Lymphocytes Relative: 40 %
Lymphs Abs: 1.7 10*3/uL (ref 0.7–4.0)
Monocytes Absolute: 0.5 10*3/uL (ref 0.1–1.0)
Monocytes Relative: 11 %
Neutro Abs: 2 10*3/uL (ref 1.7–7.7)
Neutrophils Relative %: 44 %

## 2021-08-09 LAB — COMPREHENSIVE METABOLIC PANEL
ALT: 23 U/L (ref 0–44)
AST: 23 U/L (ref 15–41)
Albumin: 3.8 g/dL (ref 3.5–5.0)
Alkaline Phosphatase: 57 U/L (ref 38–126)
Anion gap: 5 (ref 5–15)
BUN: 25 mg/dL — ABNORMAL HIGH (ref 8–23)
CO2: 29 mmol/L (ref 22–32)
Calcium: 9.3 mg/dL (ref 8.9–10.3)
Chloride: 105 mmol/L (ref 98–111)
Creatinine, Ser: 1.13 mg/dL (ref 0.61–1.24)
GFR, Estimated: >60 mL/min (ref >60)
Glucose, Bld: 100 mg/dL — ABNORMAL HIGH (ref 70–99)
Potassium: 3.9 mmol/L (ref 3.5–5.1)
Sodium: 139 mmol/L (ref 135–145)
Total Bilirubin: 0.9 mg/dL (ref 0.3–1.2)
Total Protein: 7.1 g/dL (ref 6.5–8.1)

## 2021-08-09 LAB — CBC
HCT: 44.6 % (ref 39.0–52.0)
Hemoglobin: 16.1 g/dL (ref 13.0–17.0)
MCH: 34.7 pg — ABNORMAL HIGH (ref 26.0–34.0)
MCHC: 36.1 g/dL — ABNORMAL HIGH (ref 30.0–36.0)
MCV: 96.1 fL (ref 80.0–100.0)
Platelets: 158 10*3/uL (ref 150–400)
RBC: 4.64 MIL/uL (ref 4.22–5.81)
RDW: 12.3 % (ref 11.5–15.5)
WBC: 4.3 10*3/uL (ref 4.0–10.5)
nRBC: 0 % (ref 0.0–0.2)

## 2021-08-09 LAB — URINALYSIS, ROUTINE W REFLEX MICROSCOPIC
Bilirubin Urine: NEGATIVE
Glucose, UA: NEGATIVE mg/dL
Hgb urine dipstick: NEGATIVE
Ketones, ur: 15 mg/dL — AB
Leukocytes,Ua: NEGATIVE
Nitrite: NEGATIVE
Protein, ur: NEGATIVE mg/dL
Specific Gravity, Urine: 1.015 (ref 1.005–1.030)
pH: 6.5 (ref 5.0–8.0)

## 2021-08-09 LAB — CBG MONITORING, ED: Glucose-Capillary: 100 mg/dL — ABNORMAL HIGH (ref 70–99)

## 2021-08-09 LAB — ETHANOL: Alcohol, Ethyl (B): <10 mg/dL (ref <10)

## 2021-08-09 LAB — PROTIME-INR
INR: 1.1 (ref 0.8–1.2)
Prothrombin Time: 14.4 seconds (ref 11.4–15.2)

## 2021-08-09 LAB — APTT: aPTT: 34 seconds (ref 24–36)

## 2021-08-09 MED ORDER — ASPIRIN 81 MG PO CHEW
81.0000 mg | CHEWABLE_TABLET | Freq: Once | ORAL | Status: AC
Start: 2021-08-09 — End: 2021-08-09
  Administered 2021-08-09: 81 mg via ORAL
  Filled 2021-08-09: qty 1

## 2021-08-09 MED ORDER — IOHEXOL 350 MG/ML SOLN
75.0000 mL | Freq: Once | INTRAVENOUS | Status: AC | PRN
  Administered 2021-08-09: 75 mL via INTRAVENOUS

## 2021-08-09 NOTE — ED Notes (Signed)
Client presented with rt side weakness, speech WNL, able to swallow well. Appears in no distress, ED MD at bedside to perform NIH, per examination CODE STROKE was implemented, pt then to CT Scan at 1647hrs via stretcher with RN. Additional orders rec and immediately implemented. Neuro Tele was also implemented

## 2021-08-09 NOTE — ED Notes (Signed)
In CT, will update when he returns

## 2021-08-09 NOTE — Consult Note (Signed)
[facility] Telemedicine Consult   Requesting Provider: [place] Participants: patient, wife, Er Md Location of the provider: [facility] stroke center Location of the patient: [place] ED  This consult was provided via telemedicine with 2-way video and audio communication. The patient/family was informed that care would be provided in this way and agreed to receive care in this manner.    Chief Complaint: code stroke  HPI: [name] is a 87 year old Caucasian male with past medical history of factor V deficiency, DVT, PE on Eliquis, mild aortic stenosis, dementia, hypertension and hyperlipidemia.  He was brought in today as a code stroke and family noticed that at 2 PM he had trouble seeing on the right side and controlling his right hand.  He was brought in by family in a private vehicle.  Code stroke was activated upon arrival by ER MD when it was noticed that while walking from the wheelchair to the bed he was seen to be clearly leaning to the right with lack of control of the right side.  On my exam patient was found to have right-sided peripheral vision loss and slow but accurate coordination on the right side no other deficits.  Patient's wife states he is quite compliant with taking his Eliquis and took his last dose this morning.  Patient had been seen in the ER on 08/03/2021 for a fall.  At that time CT scan of the cervical spine and brain were both unremarkable.  LKW: 2 PM tpa given?: No, patient on Eliquis and to dose this morning IR Thrombectomy? No, LVO unlikely and minimum deficits Modified Rankin Scale: 1-No significant post stroke disability and can perform usual duties with stroke symptoms Time of teleneurologist evaluation: 58 57  Exam: Pleasant obese elderly Caucasian male not in distress. Vitals:   08/09/21 1641  BP: 137/82  Pulse: 76  Resp: 18  Temp: 98.1 F (36.7 C)  SpO2: 97%   NIHSS  1A: Level of Consciousness - 0 1B: Ask  Month and Age - 0 1C: 'Blink Eyes' & 'Squeeze Hands' - 0 2: Test Horizontal Extraocular Movements - 0 3: Test Visual Fields - 2 4: Test Facial Palsy - 0 5A: Test Left Arm Motor Drift - 0 5B: Test Right Arm Motor Drift - 0 6A: Test Left Leg Motor Drift - 0 6B: Test Right Leg Motor Drift - 0 7: Test Limb Ataxia - 0 8: Test Sensation - 0 9: Test Language/Aphasia- 0 10: Test Dysarthria - 0 11: Test Extinction/Inattention - 0 NIHSS score: 2   Imaging Reviewed: CT head noncontrast shows no acute abnormality.  Dolichoectasia of the basilar artery.  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 He is not a candidate for thrombolysis as his is on Eliquis. Recommendations:  Check emergent CT angiogram of brain and neck and if proximal left PCA occlusion is found he may consider mechanical thrombectomy but if the clot is distal will require conservative management. Currently to [facility] for admission and further stroke work-up. MRI scan of the brain, echocardiogram, lipid profile, hemoglobin A1c. Physical therapy, Occupational Therapy, speech therapy consults. Stroke team to follow. Long discussion with the patient and his wife at  the bedside as well as with the ER physician and answered questions.   This patient is receiving care for possible acute neurological changes. There was 30 minutes of minutes of neuro critical care by this provider at the time of service, including time for direct evaluation via telemedicine, review of medical records, imaging studies and discussion of findings with providers, the patient and/or family.  Antony Contras,  MD [facility] [phone]  If 7pm- 7am, please page neurology on call as listed in [place].

## 2021-08-09 NOTE — ED Notes (Signed)
Back to CT scan for CT ANGIO HEAD per Neuro MD orders

## 2021-08-09 NOTE — Progress Notes (Signed)
Patient back from CT department 

## 2021-08-09 NOTE — ED Provider Notes (Signed)
[facility] EMERGENCY DEPARTMENT Provider Note   CSN: [id number] Arrival date & time: 08/09/21  1631     History {Add pertinent medical, surgical, social history, OB history to HPI:1} Chief Complaint  Patient presents with   Code Stroke   Gait Problem    Shawn [name] is a 87 y.o. male.  HPI       Past Medical History:  Diagnosis Date   Allergy    Cardiac murmur    mild aortic sclerosis   Cataract    left eye   Clotting disorder ([place])    Factor v Leiden factor mutation   Colon polyps    Hyperplastic   Diverticulosis    Dysphagia    Factor V Leiden mutation ([place])    Hemorrhoids    History of blood clots    Hyperlipidemia    Osteoarthritis      Home Medications Prior to Admission medications   Medication Sig Start Date End Date Taking? Authorizing Provider  Cholecalciferol 25 MCG (1000 UT) capsule Take 5,000 Units by mouth daily.    [provider]  Cyanocobalamin (VITAMIN B-12) 5000 MCG TBDP Take 1 tablet by mouth daily.    [provider]  donepezil (ARICEPT) 10 MG tablet Take 1 tablet daily 05/24/21   Shawn Route, Coralee Pesa, PA-C  ELIQUIS 5 MG TABS tablet TAKE 1 TABLET BY MOUTH TWICE A DAY 04/05/21   Carollee Herter, Alferd Apa, DO  folic acid (FOLVITE) 657 MCG tablet Take 400 mcg by mouth daily.    [provider]  Hyaluronic Acid-Vitamin C (HYALURONIC ACID PO) Take 2 tablets by mouth daily. (200 mg)    [provider]  Multiple Vitamin (MULTIVITAMIN) tablet Take 1 tablet by mouth daily.    [provider]  ofloxacin (FLOXIN OTIC) 0.3 % OTIC solution Place 10 drops into the right ear daily. 07/07/21   Ann Held, DO  Omega-3 Fatty Acids (FISH OIL) 1000 MG CAPS Take 3,500 mg by mouth daily.    [provider]  Saw Palmetto 450-15 MG CAPS Take 1 capsule by mouth daily. Patient taking differently: Take 2 capsules by mouth daily. 12/27/17   Ann Held, DO      Allergies    Niacin  and Pravastatin    Review of Systems   Review of Systems  Physical Exam Updated Vital Signs BP 134/69   Pulse 80   Temp 98.1 F (36.7 C) (Oral)   Resp 17   Wt 78.4 kg   SpO2 99%   BMI 27.89 kg/m  Physical Exam  ED Results / Procedures / Treatments   Labs (all labs ordered are listed, but only abnormal results are displayed) Labs Reviewed  CBC - Abnormal; Notable for the following components:      Result Value   MCH 34.7 (*)    MCHC 36.1 (*)    All other components within normal limits  COMPREHENSIVE METABOLIC PANEL - Abnormal; Notable for the following components:   Glucose, Bld 100 (*)    BUN 25 (*)    All other components within normal limits  CBG MONITORING, ED - Abnormal; Notable for the following components:   Glucose-Capillary 100 (*)    All other components within normal limits  ETHANOL  PROTIME-INR  APTT  DIFFERENTIAL  RAPID URINE DRUG SCREEN, HOSP PERFORMED  URINALYSIS, ROUTINE W REFLEX MICROSCOPIC    EKG None  Radiology CT ANGIO HEAD NECK W WO CM (CODE STROKE)  Result  Date: 08/09/2021 CLINICAL DATA:  Stroke follow-up EXAM: CT ANGIOGRAPHY HEAD AND NECK TECHNIQUE: Multidetector CT imaging of the head and neck was performed using the standard protocol during bolus administration of intravenous contrast. Multiplanar CT image reconstructions and MIPs were obtained to evaluate the vascular anatomy. Carotid stenosis measurements (when applicable) are obtained utilizing NASCET criteria, using the distal internal carotid diameter as the denominator. RADIATION DOSE REDUCTION: This exam was performed according to the departmental dose-optimization program which includes automated exposure control, adjustment of the mA and/or kV according to patient size and/or use of iterative reconstruction technique. CONTRAST:  109m OMNIPAQUE IOHEXOL 350 MG/ML SOLN COMPARISON:  CT head 08/09/2021 FINDINGS: CTA NECK FINDINGS Aortic arch: Standard branching. Imaged portion shows no  evidence of aneurysm or dissection. No significant stenosis of the major arch vessel origins. Right carotid system: Mild atherosclerotic calcification right carotid bifurcation. Negative for stenosis Left carotid system: Mild atherosclerotic calcification left carotid bifurcation. Negative for stenosis. Vertebral arteries: Both vertebral arteries widely patent without stenosis. Skeleton: Mild cervical spondylosis.  No acute abnormality Other neck: Negative for mass or adenopathy in the neck. Upper chest: Lung apices clear bilaterally Review of the MIP images confirms the above findings CTA HEAD FINDINGS Anterior circulation: Mild atherosclerotic calcification in the cavernous carotid bilaterally without significant stenosis. Anterior and middle cerebral arteries patent. No significant stenosis or large vessel occlusion. Posterior circulation: Both vertebral arteries patent to the basilar. Right PICA patent. Left PICA not visualized but there is a large left AICA. Basilar widely patent. Superior cerebellar and posterior cerebral arteries patent bilaterally without stenosis or large vessel occlusion. Venous sinuses: Normal venous enhancement. Anatomic variants: None Review of the MIP images confirms the above findings IMPRESSION: 1. Negative for intracranial large vessel occlusion or significant stenosis 2. No significant carotid or vertebral artery stenosis in the neck. Mild atherosclerotic disease of the carotid bifurcation bilaterally. Electronically Signed   By: CFranchot GalloM.D.   On: 08/09/2021 17:50   CT HEAD CODE STROKE WO CONTRAST  Result Date: 08/09/2021 CLINICAL DATA:  Code stroke. Neuro deficit, acute, stroke suspected; dizziness and right-sided weakness EXAM: CT HEAD WITHOUT CONTRAST TECHNIQUE: Contiguous axial images were obtained from the base of the skull through the vertex without intravenous contrast. RADIATION DOSE REDUCTION: This exam was performed according to the departmental  dose-optimization program which includes automated exposure control, adjustment of the mA and/or kV according to patient size and/or use of iterative reconstruction technique. COMPARISON:  08/03/2021 FINDINGS: Brain: There is no acute intracranial hemorrhage, mass effect, or edema. No new loss of gray-white differentiation. Patchy and confluent areas of low-density in the supratentorial white matter nonspecific but probably reflect similar moderate to advanced chronic microvascular ischemic changes. Prominence of the ventricles and sulci reflects stable parenchymal volume loss. No extra-axial collection. Vascular: No hyperdense vessel. There is intracranial atherosclerotic calcification at the skull base. Skull: Unremarkable. Sinuses/Orbits: No acute finding. Other: Mastoid air cells are clear. ASPECTS ([place]roke Program Early CT Score) - Ganglionic level infarction (caudate, lentiform nuclei, internal capsule, insula, M1-M3 cortex): 7 - Supraganglionic infarction (M4-M6 cortex): 3 Total score (0-10 with 10 being normal): 10 IMPRESSION: There is no acute intracranial hemorrhage or evidence of acute infarction. ASPECT score is 10. Stable chronic/nonemergent findings detailed above. These results were called by telephone at the time of interpretation on 08/09/2021 at 5:03 pm to [facility], who verbally acknowledged these results. Electronically Signed   By: PMacy MisM.D.   On: 08/09/2021 17:07  Procedures Procedures  {Document cardiac monitor, telemetry assessment procedure when appropriate:1}  Medications Ordered in ED Medications  iohexol (OMNIPAQUE) 350 MG/ML injection 75 mL (75 mLs Intravenous Contrast Given 08/09/21 1732)    ED Course/ Medical Decision Making/ A&P                           Medical Decision Making Amount and/or Complexity of Data Reviewed Labs: ordered. Radiology: ordered.  Risk Prescription drug management.   ***  {Document critical care time when  appropriate:1} {Document review of labs and clinical decision tools ie heart score, Chads2Vasc2 etc:1}  {Document your independent review of radiology images, and any outside records:1} {Document your discussion with family members, caretakers, and with consultants:1} {Document social determinants of health affecting pt's care:1} {Document your decision making why or why not admission, treatments were needed:1} Final Clinical Impression(s) / ED Diagnoses Final diagnoses:  None    Rx / DC Orders ED Discharge Orders     None

## 2021-08-09 NOTE — ED Triage Notes (Addendum)
Started having dizziness today at 1400, states was unable to grasp glasses with right arm.   Symptoms resolved. VAN negative. Wife states he was uncoordinated getting out of car.

## 2021-08-09 NOTE — Progress Notes (Signed)
Patient to CT department 

## 2021-08-09 NOTE — Progress Notes (Signed)
Patient back to CT department for advanced stroke imaging

## 2021-08-09 NOTE — ED Notes (Signed)
Pt in CT will update vitals when return to room.

## 2021-08-09 NOTE — Progress Notes (Signed)
Stroke alert cart activation at 1651

## 2021-08-09 NOTE — ED Notes (Signed)
Upon skin assessment, client noted to have older bruising noted at Rt shoulder/ scapula area, client stated he fell recently

## 2021-08-09 NOTE — Progress Notes (Signed)
Neurologist, Dr Antony Contras, joins the telemedicine cart at [phone]

## 2021-08-09 NOTE — ED Notes (Signed)
Patient with ataxia while ambulating from wheelchair to bed, appears uncoordinated. Difficulty touching right finger to nose and this RN finger during exam.

## 2021-08-09 NOTE — ED Notes (Signed)
Dr. Billy Fischer at bedside, code stroke initiated

## 2021-08-10 ENCOUNTER — Observation Stay (HOSPITAL_COMMUNITY): Payer: Medicare HMO

## 2021-08-10 DIAGNOSIS — R2689 Other abnormalities of gait and mobility: Secondary | ICD-10-CM | POA: Diagnosis not present

## 2021-08-10 DIAGNOSIS — R270 Ataxia, unspecified: Secondary | ICD-10-CM | POA: Diagnosis not present

## 2021-08-10 DIAGNOSIS — Z79899 Other long term (current) drug therapy: Secondary | ICD-10-CM | POA: Diagnosis not present

## 2021-08-10 DIAGNOSIS — R29818 Other symptoms and signs involving the nervous system: Secondary | ICD-10-CM | POA: Diagnosis not present

## 2021-08-10 DIAGNOSIS — I639 Cerebral infarction, unspecified: Secondary | ICD-10-CM | POA: Diagnosis not present

## 2021-08-10 DIAGNOSIS — I10 Essential (primary) hypertension: Secondary | ICD-10-CM | POA: Diagnosis not present

## 2021-08-10 DIAGNOSIS — Z86718 Personal history of other venous thrombosis and embolism: Secondary | ICD-10-CM | POA: Diagnosis not present

## 2021-08-10 DIAGNOSIS — R69 Illness, unspecified: Secondary | ICD-10-CM | POA: Diagnosis not present

## 2021-08-10 DIAGNOSIS — Z86711 Personal history of pulmonary embolism: Secondary | ICD-10-CM | POA: Diagnosis not present

## 2021-08-10 DIAGNOSIS — Z7901 Long term (current) use of anticoagulants: Secondary | ICD-10-CM | POA: Diagnosis not present

## 2021-08-10 DIAGNOSIS — F03A0 Unspecified dementia, mild, without behavioral disturbance, psychotic disturbance, mood disturbance, and anxiety: Secondary | ICD-10-CM | POA: Diagnosis not present

## 2021-08-10 MED ORDER — ACETAMINOPHEN 650 MG RE SUPP: 650.0000 mg | RECTAL | Status: DC | PRN

## 2021-08-10 MED ORDER — ASPIRIN 81 MG PO CHEW
81.0000 mg | CHEWABLE_TABLET | Freq: Every day | ORAL | Status: DC
  Administered 2021-08-10 – 2021-08-11 (×2): 81 mg via ORAL
  Filled 2021-08-10 (×2): qty 1

## 2021-08-10 MED ORDER — DONEPEZIL HCL 10 MG PO TABS
10.0000 mg | ORAL_TABLET | Freq: Every day | ORAL | Status: DC
  Administered 2021-08-11: 10 mg via ORAL
  Filled 2021-08-10: qty 1

## 2021-08-10 MED ORDER — SENNOSIDES-DOCUSATE SODIUM 8.6-50 MG PO TABS
1.0000 | ORAL_TABLET | Freq: Every evening | ORAL | Status: DC | PRN
Start: 2021-08-10 — End: 2021-08-11

## 2021-08-10 MED ORDER — STROKE: EARLY STAGES OF RECOVERY BOOK
Freq: Once | Status: AC
  Filled 2021-08-10: qty 1

## 2021-08-10 MED ORDER — ACETAMINOPHEN 325 MG PO TABS
650.0000 mg | ORAL_TABLET | Freq: Once | ORAL | Status: AC
  Administered 2021-08-10: 650 mg via ORAL
  Filled 2021-08-10: qty 2

## 2021-08-10 MED ORDER — APIXABAN 5 MG PO TABS
5.0000 mg | ORAL_TABLET | Freq: Two times a day (BID) | ORAL | Status: DC
  Administered 2021-08-10 – 2021-08-11 (×2): 5 mg via ORAL
  Filled 2021-08-10 (×2): qty 1

## 2021-08-10 MED ORDER — ACETAMINOPHEN 325 MG PO TABS: 650.0000 mg | ORAL_TABLET | ORAL | Status: DC | PRN

## 2021-08-10 MED ORDER — ACETAMINOPHEN 160 MG/5ML PO SOLN: 650.0000 mg | ORAL | Status: DC | PRN

## 2021-08-10 NOTE — Hospital Course (Signed)
Shawn [name] is a 87 y.o. 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

## 2021-08-10 NOTE — ED Notes (Signed)
[facility] on Phone speaking with Lamar Laundry RN at present time

## 2021-08-10 NOTE — H&P (Addendum)
History and Physical    Shawn [name] VQQ:[id number] DOB: 04-May-1934 DOA: 08/09/2021  PCP: Ann Held, DO  Patient coming from: Home  I have personally briefly reviewed patient's old medical records in [place]  Chief Complaint: Right arm incoordination, gait instability  HPI: Shawn [name] is a 87 y.o. 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  Patient states he fell down a flight of stairs 2 weeks ago including hitting his head.  He did have imaging performed with CT head/C-spine on 6/29 which was negative for acute intracranial abnormality or cervical spine abnormality.  Around 3-4 PM on 7/5 he developed new onset right hand incoordination when he was attempting to pick up his glasses.  He then noticed difficulty ambulating and states he felt off balance.  He denied any difficulty with coordination or strength in his right leg.  He has not had any change in vision, headache, chest pain, palpitations, dyspnea, numbness/tingling.  He denies any similar symptoms.  He says his presenting symptoms are improved but not quite back to baseline.  He has been on Eliquis for history of factor V Leiden and prior DVT/PE.  [facility] ED Course  Labs/Imaging on admission: I have personally reviewed following labs and imaging studies.  Initial vitals showed BP 137/82, pulse 76, RR 18, temp 98.1 F, SPO2 97% on room air.  Labs showed WBC 4.3, hemoglobin 16.1, platelets 158,000, sodium 139, potassium 3.9, bicarb 29, BUN 25, creatinine 1.13, serum glucose 100, LFTs within normal limits.  UDS negative.  Urinalysis negative for UTI.  Serum ethanol <10.  CT head without contrast negative for acute intracranial hemorrhage or evidence of acute  infarction.  Teleneurology were consulted and recommended CTA head/neck and admission for further CVA work-up.  CTA head/neck negative for intracranial LVO or significant stenosis.  No significant carotid or vertebral artery stenosis in the neck.  Patient was given aspirin 81 mg.  Eliquis held pending MRI.  The hospitalist service was consulted to admit for further evaluation and management.  Patient arrived to [facility] floor evening 08/10/2021.  Review of Systems: All systems reviewed and are negative except as documented in history of present illness above.   Past Medical History:  Diagnosis Date   Allergy    Cardiac murmur    mild aortic sclerosis   Cataract    left eye   Clotting disorder ([place])    Factor v Leiden factor mutation   Colon polyps    Hyperplastic   Diverticulosis    Dysphagia    Factor V Leiden mutation ([place])    Hemorrhoids    History of blood clots    Hyperlipidemia    Osteoarthritis     Past Surgical History:  Procedure Laterality Date   APPENDECTOMY     CATARACT EXTRACTION  02/09/2007   right eye   INGUINAL HERNIA REPAIR     TONSILLECTOMY     VARICOSE VEIN SURGERY     VASECTOMY      Social History:  reports that he has never smoked. He has never used smokeless tobacco. He reports that he does not drink alcohol and does not use drugs.  Allergies  Allergen Reactions   Niacin Rash    "My whole body turned red on  the prescription dose" He takes the OTC medication   Pravastatin Other (See Comments)    Disorientation    Family History  Problem Relation Age of Onset   Lung cancer Father    Heart attack Mother 65   Heart disease Mother        chf   Arthritis Mother    Colon cancer Neg Hx    Esophageal cancer Neg Hx    Rectal cancer Neg Hx    Stomach cancer Neg Hx      Prior to Admission medications   Medication Sig Start Date End Date Taking? Authorizing Provider  Cholecalciferol 25 MCG (1000 UT) capsule Take 5,000 Units by mouth daily.     [provider]  Cyanocobalamin (VITAMIN B-12) 5000 MCG TBDP Take 1 tablet by mouth daily.    [provider]  donepezil (ARICEPT) 10 MG tablet Take 1 tablet daily 05/24/21   Shawn Route, Coralee Pesa, PA-C  ELIQUIS 5 MG TABS tablet TAKE 1 TABLET BY MOUTH TWICE A DAY 04/05/21   Carollee Herter, Alferd Apa, DO  folic acid (FOLVITE) 034 MCG tablet Take 400 mcg by mouth daily.    [provider]  Hyaluronic Acid-Vitamin C (HYALURONIC ACID PO) Take 2 tablets by mouth daily. (200 mg)    [provider]  Multiple Vitamin (MULTIVITAMIN) tablet Take 1 tablet by mouth daily.    [provider]  ofloxacin (FLOXIN OTIC) 0.3 % OTIC solution Place 10 drops into the right ear daily. 07/07/21   Ann Held, DO  Omega-3 Fatty Acids (FISH OIL) 1000 MG CAPS Take 3,500 mg by mouth daily.    [provider]  Saw Palmetto 450-15 MG CAPS Take 1 capsule by mouth daily. Patient taking differently: Take 2 capsules by mouth daily. 12/27/17   Ann Held, DO    Physical Exam: Vitals:   08/10/21 1345 08/10/21 1500 08/10/21 1734 08/10/21 2000  BP: (!) 126/97 (!) 126/93 (!) 150/83 (!) 152/91  Pulse: 80 91 78 81  Resp: 18 (!) 23  18  Temp:   97.9 F (36.6 C) 97.9 F (36.6 C)  TempSrc:   Oral Oral  SpO2: 95% 94% 96% 94%  Weight:       Constitutional: Sitting up in bed, NAD, calm, comfortable Eyes: EOMI, lids and conjunctivae normal ENMT: Mucous membranes are moist. Posterior pharynx clear of any exudate or lesions.Normal dentition.  Neck: normal, supple, no masses. Respiratory: clear to auscultation bilaterally, no wheezing, no crackles. Normal respiratory effort. No accessory muscle use.  Cardiovascular: Regular rate and rhythm, murmur present. No extremity edema. 2+ pedal pulses. Abdomen: no tenderness, no masses palpated. No hepatosplenomegaly. Bowel sounds positive.  Musculoskeletal: no clubbing / cyanosis. No joint deformity upper and lower extremities.  Good ROM, no contractures. Normal muscle tone.  Skin: no rashes, lesions, ulcers. No induration Neurologic: CN 2-12 grossly intact. Sensation intact. Strength 5/5 in all 4.  FTN and HTS intact. Psychiatric: Normal judgment and insight. Alert and oriented x 3. Normal mood.   EKG: Personally reviewed. Normal sinus rhythm, early R wave transition, no acute ischemic changes.  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  Assessment and Plan: * Ataxia of right upper extremity Gait imbalance New onset RUE ataxia and gait imbalance 3-4 PM on 7/5.  CT head, CTA head/neck negative for acute intracranial abnormality or LVO.  Neurology recommended admission for further CVA work-up. 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  Hyperlipidemia Not currently on statin.  DVT prophylaxis:  apixaban (ELIQUIS) tablet 5 mg  Code Status: Full code, confirmed on admission Family Communication: Spouse and daughters at bedside Disposition Plan: From home and likely discharge to home pending clinical progress Consults called: Neurology Severity of Illness: The appropriate patient status for this patient is OBSERVATION. Observation status is judged to be reasonable and necessary in order to provide the  required intensity of service to ensure the patient's safety. The patient's presenting symptoms, physical exam findings, and initial radiographic and laboratory data in the context of their medical condition is felt to place them at decreased risk for further clinical deterioration. Furthermore, it is anticipated that the patient will be medically stable for discharge from the hospital within 2 midnights of admission.   Zada Finders MD [facility]  If 7PM-7AM, please contact night-coverage [email or web address]  08/10/2021, 10:05 PM

## 2021-08-10 NOTE — ED Notes (Signed)
Found patient standing at the end of bed, needing to have BM.  Assisted patient to restroom, steady gait.  Returned to room and cardiac monitoring resumed.

## 2021-08-10 NOTE — ED Notes (Signed)
In for am rounding on client, only complains of frontal headache at this time, does state he feels sore or uncomfortable where he fell previously. Very alert and oriented, follows commands without hesitation. MAE x 4, appears more alert since presentation time yesterday.

## 2021-08-10 NOTE — ED Notes (Signed)
Family updated as to patient's status.

## 2021-08-10 NOTE — ED Notes (Signed)
During neuro assessment, pt noted to have improvement on performing task of finger to nose task. Yesterday had much difficulty in using rt hand and pointing to providers index finger, this am, noted task was much easier and improved speed when performing nose to finger.

## 2021-08-10 NOTE — ED Notes (Signed)
Pt. Up and walked to restroom with no difficulty.  Pt. Back to his room with no difficulty.  Pt. Wife at bedside and has only questions about when they will be transported to the other hospital.  Pt. And visitor informed that the transport team will call first and I will inform them asap.

## 2021-08-10 NOTE — Assessment & Plan Note (Signed)
Factor V Leiden -Resume home Eliquis

## 2021-08-10 NOTE — Assessment & Plan Note (Signed)
Continue donepezil 

## 2021-08-10 NOTE — Assessment & Plan Note (Addendum)
Gait imbalance New onset RUE ataxia and gait imbalance 3-4 PM on 7/5.  CT head, CTA head/neck negative for acute intracranial abnormality or LVO.  Neurology recommended admission for further CVA work-up. -MRI brain obtained and negative for acute endocrine abnormality -Started on aspirin 81 mg daily per neurology -Check A1c, lipid panel -Keep on telemetry -Obtain echocardiogram -PT/OT/SLP eval

## 2021-08-10 NOTE — Assessment & Plan Note (Signed)
Not currently on statin. 

## 2021-08-11 ENCOUNTER — Observation Stay (HOSPITAL_BASED_OUTPATIENT_CLINIC_OR_DEPARTMENT_OTHER): Payer: Medicare HMO

## 2021-08-11 DIAGNOSIS — E785 Hyperlipidemia, unspecified: Secondary | ICD-10-CM | POA: Diagnosis not present

## 2021-08-11 DIAGNOSIS — G459 Transient cerebral ischemic attack, unspecified: Secondary | ICD-10-CM | POA: Diagnosis not present

## 2021-08-11 DIAGNOSIS — F0390 Unspecified dementia without behavioral disturbance: Secondary | ICD-10-CM

## 2021-08-11 DIAGNOSIS — Z86718 Personal history of other venous thrombosis and embolism: Secondary | ICD-10-CM

## 2021-08-11 DIAGNOSIS — R69 Illness, unspecified: Secondary | ICD-10-CM | POA: Diagnosis not present

## 2021-08-11 DIAGNOSIS — R270 Ataxia, unspecified: Secondary | ICD-10-CM | POA: Diagnosis not present

## 2021-08-11 LAB — CBC
HCT: 44.2 % (ref 39.0–52.0)
Hemoglobin: 16.1 g/dL (ref 13.0–17.0)
MCH: 34.3 pg — ABNORMAL HIGH (ref 26.0–34.0)
MCHC: 36.4 g/dL — ABNORMAL HIGH (ref 30.0–36.0)
MCV: 94 fL (ref 80.0–100.0)
Platelets: 133 10*3/uL — ABNORMAL LOW (ref 150–400)
RBC: 4.7 MIL/uL (ref 4.22–5.81)
RDW: 12 % (ref 11.5–15.5)
WBC: 5.2 10*3/uL (ref 4.0–10.5)
nRBC: 0 % (ref 0.0–0.2)

## 2021-08-11 LAB — BASIC METABOLIC PANEL
Anion gap: 6 (ref 5–15)
BUN: 12 mg/dL (ref 8–23)
CO2: 28 mmol/L (ref 22–32)
Calcium: 9.3 mg/dL (ref 8.9–10.3)
Chloride: 106 mmol/L (ref 98–111)
Creatinine, Ser: 0.92 mg/dL (ref 0.61–1.24)
GFR, Estimated: >60 mL/min (ref >60)
Glucose, Bld: 103 mg/dL — ABNORMAL HIGH (ref 70–99)
Potassium: 3.6 mmol/L (ref 3.5–5.1)
Sodium: 140 mmol/L (ref 135–145)

## 2021-08-11 LAB — ECHOCARDIOGRAM COMPLETE
AR max vel: 1.29 cm2
AV Area VTI: 1.46 cm2
AV Area mean vel: 1.31 cm2
AV Mean grad: 18.2 mmHg
AV Peak grad: 28.9 mmHg
Ao pk vel: 2.69 m/s
Area-P 1/2: 2.86 cm2
P 1/2 time: 856 msec
S' Lateral: 2 cm
Weight: 2764.8 oz

## 2021-08-11 LAB — HEMOGLOBIN A1C
Hgb A1c MFr Bld: 4.8 % (ref 4.8–5.6)
Mean Plasma Glucose: 91.06 mg/dL

## 2021-08-11 LAB — LIPID PANEL
Cholesterol: 166 mg/dL (ref 0–200)
HDL: 37 mg/dL — ABNORMAL LOW (ref >40)
LDL Cholesterol: 107 mg/dL — ABNORMAL HIGH (ref 0–99)
Total CHOL/HDL Ratio: 4.5 RATIO
Triglycerides: 110 mg/dL (ref <150)
VLDL: 22 mg/dL (ref 0–40)

## 2021-08-11 MED ORDER — ROSUVASTATIN CALCIUM 5 MG PO TABS: 5.0000 mg | ORAL_TABLET | Freq: Every day | ORAL | 1 refills | Status: DC

## 2021-08-11 MED ORDER — ROSUVASTATIN CALCIUM 5 MG PO TABS: 5.0000 mg | ORAL_TABLET | Freq: Every day | ORAL | Status: DC

## 2021-08-11 MED ORDER — ROSUVASTATIN CALCIUM 20 MG PO TABS
20.0000 mg | ORAL_TABLET | Freq: Every day | ORAL | Status: DC
Start: 2021-08-11 — End: 2021-08-11

## 2021-08-11 NOTE — Progress Notes (Addendum)
STROKE TEAM PROGRESS NOTE   INTERVAL HISTORY Patient is seen in his room with his wife and 1 more family member at the bedside.  On 08/09/21, he experienced acute onset right sided visual field deficits and difficulty controlling his right hand.  He was not given TNK as he is on Eliquis for Factor V Leiden.  MRI shows no acute abnormlaities. He has returned back to his baseline.  Vital signs are stable. Vitals:   08/10/21 2000 08/11/21 0000 08/11/21 0400 08/11/21 1104  BP: (!) 152/91 (!) 148/88 (!) 150/84 133/86  Pulse: 81 60 65 72  Resp: '18 18 18 14  '$ Temp: 97.9 F (36.6 C) 98 F (36.7 C) 97.9 F (36.6 C) 98.4 F (36.9 C)  TempSrc: Oral Oral Oral Oral  SpO2: 94% 95% 96% 96%  Weight:       CBC:  Recent Labs  Lab 08/09/21 1705 08/11/21 0149  WBC 4.3 5.2  NEUTROABS 2.0  --   HGB 16.1 16.1  HCT 44.6 44.2  MCV 96.1 94.0  PLT 158 812*   Basic Metabolic Panel:  Recent Labs  Lab 08/09/21 1705 08/11/21 0149  NA 139 140  K 3.9 3.6  CL 105 106  CO2 29 28  GLUCOSE 100* 103*  BUN 25* 12  CREATININE 1.13 0.92  CALCIUM 9.3 9.3   Lipid Panel:  Recent Labs  Lab 08/11/21 0149  CHOL 166  TRIG 110  HDL 37*  CHOLHDL 4.5  VLDL 22  LDLCALC 107*   HgbA1c:  Recent Labs  Lab 08/11/21 0149  HGBA1C 4.8   Urine Drug Screen:  Recent Labs  Lab 08/09/21 1856  LABOPIA NONE DETECTED  COCAINSCRNUR NONE DETECTED  LABBENZ NONE DETECTED  AMPHETMU NONE DETECTED  THCU NONE DETECTED  LABBARB NONE DETECTED    Alcohol Level  Recent Labs  Lab 08/09/21 1705  ETH <10    IMAGING past 24 hours MR BRAIN WO CONTRAST  Result Date: 08/10/2021 CLINICAL DATA:  Acute neurologic deficit EXAM: MRI HEAD WITHOUT CONTRAST TECHNIQUE: Multiplanar, multiecho pulse sequences of the brain and surrounding structures were obtained without intravenous contrast. COMPARISON:  03/31/2020 FINDINGS: Brain: No acute infarct, mass effect or extra-axial collection. No acute or chronic hemorrhage. There is  confluent hyperintense T2-weighted signal within the white matter. Generalized volume loss. The midline structures are normal. Vascular: Major flow voids are preserved. Skull and upper cervical spine: Normal calvarium and skull base. Visualized upper cervical spine and soft tissues are normal. Sinuses/Orbits:No paranasal sinus fluid levels or advanced mucosal thickening. No mastoid or middle ear effusion. Normal orbits. IMPRESSION: 1. No acute intracranial abnormality. 2. Findings of chronic ischemic microangiopathy and generalized volume loss. Electronically Signed   By: Ulyses Jarred M.D.   On: 08/10/2021 19:19    PHYSICAL EXAM General:  Alert, well-nourished, well-developed elderly Caucasian male patient in no acute distress Respiratory:  Regular, unlabored respirations on room air  NEURO:  Mental Status: AA&Ox3  Speech/Language: speech is without dysarthria or aphasia.  Fluency, and comprehension intact.  Cranial Nerves:  II: PERRL. Visual fields full.  III, IV, VI: EOMI. Eyelids elevate symmetrically.  V: Sensation is intact to light touch and symmetrical to face.  VII: Smile is symmetrical.   VIII: hearing intact to voice. IX, X: Phonation is normal.  XII: tongue is midline without fasciculations. Motor: 5/5 strength to all muscle groups tested.  Tone: is normal and bulk is normal Sensation- Intact to light touch bilaterally.  Coordination: FTN intact bilaterally, HKS: no ataxia  in BLE.No drift.  Gait- deferred   ASSESSMENT/PLAN Mr. Jayvien Rowlette Zettie Cooley is a 87 y.o. male with history of Factor V Leiden, DVT, PE, Eliquis use, mild aortic stenosis, dementia, HTN and HLD presenting with acute onset right sided visual field deficits and difficulty controlling his right hand.  He was not given TNK as he is on Eliquis for Factor V Leiden.  MRI shows no acute abnormlaities.  TIA:  left brain TIA presenting with right-sided vision deficits and incoordination Etiology:  likely embolic  from cryptogenic source versus due to hypercoagulability from factor V Leiden. Code Stroke CT head No acute abnormality. Small vessel disease. Atrophy. ASPECTS 10.    CTA head & neck no LVO or hemodynamically significant stenosis MRI  no acute intracranial abnormality 2D Echo pending LDL 107 HgbA1c 4.8 VTE prophylaxis - fully anticoagulated with Eliquis    Diet   Diet regular Room service appropriate? Yes; Fluid consistency: Thin   Eliquis (apixaban) daily prior to admission, now on Eliquis (apixaban) daily.  Therapy recommendations:  outpatient PT Disposition:  pending  Hypertension Home meds:  none Stable Keep SBP <180 Long-term BP goal normotensive  Hyperlipidemia Home meds: none LDL 107, goal < 70 Add rosuvastatin 20 mg  Continue statin at discharge  Other Stroke Risk Factors Advanced Age >/= 39   Other Active Problems Dementia Continue home Aricept  Hospital day # [place] , MSN, AGACNP-BC [facility] See Amion for schedule and pager information 08/11/2021 12:00 PM   STROKE MD NOTE :  I have personally obtained history,examined this patient, reviewed notes, independently viewed imaging studies, participated in medical decision making and plan of care.ROS completed by me personally and pertinent positives fully documented  I have made any additions or clarifications directly to the above note. Agree with note above.  Patient presented to [facility] with sudden onset of right-sided vision and coordination difficulties and was not considered for thrombolysis since he was already on Eliquis and CT angiogram did not show any LVO.  His symptoms have resolved completely back to baseline and MRI is negative for acute stroke.  This is likely an embolic posterior s circulation TIA.  Check echocardiogram.  Will not pursue a long cardiac monitoring for A-fib since patient is already on long-term anticoagulation for his factor V Leiden.   Continue Eliquis.  Aggressive risk factor modification.  Long discussion with patient and his wife and answered questions.  Discussed with Dr.Grunz Greater than 50% time during this 50-minute visit were spent in counseling and coordination of care about his TIA and discussion about stroke prevention and treatment and ordering questions. Antony Contras, MD Medical Director [facility] Pager: [phone] 08/11/2021 1:18 PM   To contact Stroke Continuity provider, please refer to [email or web address] After hours, contact General Neurology

## 2021-08-11 NOTE — Evaluation (Signed)
Occupational Therapy Evaluation Patient Details Name: Shawn [name] MRN: [id number] DOB: 15-Aug-1934 Today's Date: 08/11/2021   History of Present Illness Pt is an 87 y.o. male who presented to the ED with c/o RUE incoordination and gait instability. MRI negative. PMH:  factor V Leiden and DVT/PE on Eliquis, HLD, mild dementia   Clinical Impression   PTA, pt was living with his wife and was independent with ADLs and IADLs. Wife reporting that pt has been having decreased memory and tremors for ~1 year. Pt currently performing at Mod I level with increased time. Pt demonstrating decreased memory, problem solving, awareness, and BUE coordination.  Pt would benefit from further acute OT to facilitate safe dc. Recommend dc to home and follow up with neurologist. Discussing OP options if pt and family would desire in the future.       Recommendations for follow up therapy are one component of a multi-disciplinary discharge planning process, led by the attending physician.  Recommendations may be updated based on patient status, additional functional criteria and insurance authorization.   Follow Up Recommendations  No OT follow up (Follow up with neurology and request OP therapies if symptoms persist or worsen)    Assistance Recommended at Discharge Intermittent Supervision/Assistance  Patient can return home with the following      Functional Status Assessment  Patient has had a recent decline in their functional status and demonstrates the ability to make significant improvements in function in a reasonable and predictable amount of time.  Equipment Recommendations  None recommended by OT    Recommendations for Other Services       Precautions / Restrictions Precautions Precautions: Fall Precaution Comments: Recent fall down flight of stairs in [facility] home while [place] in [place]. Xrays negative.      Mobility Bed Mobility               General bed mobility  comments: In recliner upon arrival    Transfers Overall transfer level: Modified independent                        Balance Overall balance assessment: Needs assistance Sitting-balance support: No upper extremity supported, Feet supported Sitting balance-Leahy Scale: Good     Standing balance support: No upper extremity supported, During functional activity Standing balance-Leahy Scale: Good                             ADL either performed or assessed with clinical judgement   ADL Overall ADL's : Modified independent                                       General ADL Comments: Increased time. Noting pt requiring increased time due to decreased problem solving and memory. Pt performing tub transfer with out physical A. Performing path finding task and requiring Min cues     Vision Baseline Vision/History: 1 Wears glasses       Perception     Praxis      Pertinent Vitals/Pain       Hand Dominance Right   Extremity/Trunk Assessment Upper Extremity Assessment Upper Extremity Assessment: Defer to OT evaluation RUE Deficits / Details: Resting tremors. decreased coordination as seen during finger to nose and opposition compared to L RUE Coordination: decreased fine motor LUE Deficits / Details: Resting tremors -  Parkinson's like   Lower Extremity Assessment Lower Extremity Assessment: Overall WFL for tasks assessed   Cervical / Trunk Assessment Cervical / Trunk Assessment: Kyphotic   Communication Communication Communication: No difficulties   Cognition Arousal/Alertness: Awake/alert Behavior During Therapy: WFL for tasks assessed/performed Overall Cognitive Status: Impaired/Different from baseline Area of Impairment: Memory, Problem solving, Awareness                     Memory: Decreased short-term memory     Awareness: Emergent Problem Solving: Slow processing, Requires verbal cues General Comments: Wife  reports that memory has been affected for the past year. Pt reporting that his son and mother were with him - when it was his wife. Pt requiring increased time and cues throughout. Able to name 1/3 animal that start with C and requiring Min cues for navigating back to his room     General Comments  Wife and son present    Exercises     Shoulder Instructions      Home Living Family/patient expects to be discharged to:: Private residence Living Arrangements: Spouse/significant other Available Help at Discharge: Family;Available 24 hours/day Type of Home: House Home Access: Level entry     Home Layout: One level     Bathroom Shower/Tub: Teacher, early years/pre: Standard     Home Equipment: None          Prior Functioning/Environment Prior Level of Function : Independent/Modified Independent               ADLs Comments: wife has been attempting to stop him from driving for appox 1 year but pt still drives on occasion        OT Problem List: Decreased activity tolerance;Decreased knowledge of use of DME or AE;Decreased knowledge of precautions;Decreased cognition;Impaired UE functional use      OT Treatment/Interventions: Self-care/ADL training;Therapeutic exercise;Energy conservation;DME and/or AE instruction;Therapeutic activities;Patient/family education;Balance training    OT Goals(Current goals can be found in the care plan section) Acute Rehab OT Goals Patient Stated Goal: Go home today OT Goal Formulation: With patient/family Time For Goal Achievement: 08/25/21 Potential to Achieve Goals: Good  OT Frequency: Min 2X/week    Co-evaluation              AM-PAC OT "6 Clicks" Daily Activity     Outcome Measure Help from another person eating meals?: None Help from another person taking care of personal grooming?: None Help from another person toileting, which includes using toliet, bedpan, or urinal?: None Help from another person bathing  (including washing, rinsing, drying)?: None Help from another person to put on and taking off regular upper body clothing?: None Help from another person to put on and taking off regular lower body clothing?: None 6 Click Score: 24   End of Session Nurse Communication: Mobility status  Activity Tolerance: Patient tolerated treatment well Patient left: in chair;with call bell/phone within reach;with chair alarm set;with family/visitor present  OT Visit Diagnosis: Unsteadiness on feet (R26.81);Other abnormalities of gait and mobility (R26.89);Muscle weakness (generalized) (M62.81);Other symptoms and signs involving cognitive function                Time: 2683-4196 OT Time Calculation (min): 22 min Charges:  OT General Charges $OT Visit: 1 Visit OT Evaluation $OT Eval Low Complexity: 1 Low  Fartun Paradiso MSOT, OTR/L Acute Rehab Office: [place] 08/11/2021, 11:04 AM

## 2021-08-11 NOTE — Discharge Summary (Signed)
Physician Discharge Summary   Patient: Shawn [name] MRN: [id number] DOB: 18-Aug-1934  Admit date:     08/09/2021  Discharge date: 08/11/21  Discharge Physician: Patrecia Pour   PCP: Ann Held, DO   Recommendations at discharge:  Follow up with neurology as outpatient.  Monitor lipid panel, LFTs, tolerance of rosuvastatin '5mg'$  daily.   Discharge Diagnoses: Principal Problem:   Ataxia of right upper extremity Active Problems:   PULMONARY EMBOLISM, HX OF   Hyperlipidemia   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 [facility])  Hospital Course: Shawn [name] is a 87 y.o. 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  Work up, including MR brain revealed no acute stroke. Neurological deficits have returned to baseline. Stroke neurology evaluated the patient, recommending continuing eliquis, and starting statin for LDL of 107 and suspected embolic posterior circulation TIA.  Assessment and Plan: TIA: Suspected embolic in setting of hypercoagulable state, posterior circulation based on symptoms. Ataxia of right upper extremity. Gait imbalance New onset RUE ataxia and gait imbalance 3-4 PM on 7/5.  CT head, CTA head/neck negative for acute intracranial abnormality or LVO.  Neurology recommended admission for further CVA work-up. Symptoms have since resolved, he's back to baseline, with no stroke noted on MRI brain.  - Continue eliquis, start rosuvastatin '5mg'$  for LDL 107 - HbA1c 4.8%  - Echo shows no CES or shunts (IAS no well visualized).  - No plan to initiate outpatient cardiac monitoring with indefinite indication for anticoagulation regardless.  - Outpatient PT arranged, no further recommendations per OT and SLP at this time.   Moderate aortic stenosis: Echo showed Vmax 2.9  m/s, MG 25 mmHg, AVA  1.3 cm^2, DI 0.5  - Does not appear symptomatic, follow up with PCP recommended.   Ascending aortic dilatation: 67m by echo on 7/7.  - Serial monitoring recommended, written in patient instructions.   History of PE, Factor V Leiden - Resume home eliquis  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 (HCC) - Continue donepezil.  Hyperlipidemia - Starting statin (has had "disorientation" with pravastatin in the past, willing (d/w pt, wife, daughter) to trial low dose rosuvastatin. Since starting that, can stop taking the 7 pills/day of fish oil.  Consultants: Neurology Procedures performed: Echocardiogram  Disposition: Home Diet recommendation:  Cardiac diet DISCHARGE MEDICATION: Allergies as of 08/11/2021       Reactions   Niacin Rash   "My whole body turned red on the prescription dose" He takes the OTC medication   Pravastatin Other (See Comments)   Disorientation        Medication List     STOP taking these medications    Fish Oil 1000 MG Caps   ofloxacin 0.3 % OTIC solution Commonly known as: Floxin Otic       TAKE these medications    Cholecalciferol 25 MCG (1000 UT) capsule Take 5,000 Units by mouth daily.   donepezil 10 MG tablet Commonly known as: ARICEPT Take 1 tablet daily   Eliquis 5 MG Tabs tablet Generic drug: apixaban TAKE 1 TABLET BY MOUTH TWICE A DAY What changed: how much to take   multivitamin tablet Take 1 tablet by mouth daily.   rosuvastatin 5 MG tablet Commonly known as: CRESTOR Take 1 tablet (5 mg total) by mouth daily.   Saw Palmetto 450-15 MG  Caps Take 1 capsule by mouth daily. What changed: how much to take   Vitamin B-12 5000 MCG Tbdp Take 1 tablet by mouth daily.        Follow-up Information     [facility] [place] Follow up.   Specialty: Rehabilitation Why: The outpatient therapy will contact you for the first appointment Contact information: [address] [place], [place], [place]. Schedule an appointment as soon as possible for a visit.   Specialty: Family Medicine Contact information: [id number] [facility] [address] [address] [place] [address] [phone]                Discharge Exam: BP 133/86 (BP Location: Right Arm)   Pulse 72   Temp 98.4 F (36.9 C) (Oral)   Resp 14   Wt 78.4 kg   SpO2 96%   BMI 27.89 kg/m   Well appearing elderly male walking back from the bathroom independently Clear, nonlabored RRR No focal deficits noted  Condition at discharge: stable  The results of significant diagnostics from this hospitalization (including imaging, microbiology, ancillary and laboratory) are listed below for reference.   Imaging Studies: ECHOCARDIOGRAM COMPLETE  Result Date: 08/11/2021    ECHOCARDIOGRAM REPORT   Patient Name:   Shawn [name] [facility] Date of Exam: 08/11/2021 Medical Rec #:  [id number]                  Height:       66.0 in Accession #:    [id number]                 Weight:       172.8 lb Date of Birth:  1934-12-10                 BSA:          1.879 m Patient Age:    37 years                   BP:           133/36 mmHg Patient Gender: M                          HR:           68 bpm. Exam Location:  Inpatient Procedure: 2D Echo, Cardiac Doppler and Color Doppler Indications:    TIA  History:        Patient has prior history of Echocardiogram examinations, most                 recent 05/17/2020. Aortic Valve Disease; Risk Factors:HLD.  Sonographer:    Joette Catching RCS Referring Phys: [phone] [place]  1. Left ventricular ejection fraction, by estimation, is 60 to 65%. The left ventricle has normal function. The left ventricle has no regional wall motion abnormalities. Left ventricular diastolic parameters are indeterminate.  2. Right ventricular systolic function is normal. The right ventricular size  is normal. There is normal pulmonary artery systolic pressure. The estimated right ventricular systolic pressure is 67.8 mmHg.  3. The mitral valve is normal in structure. No evidence of mitral valve regurgitation. No evidence of mitral stenosis.  4. The aortic valve was not well visualized. Aortic valve regurgitation is trivial. Moderate aortic valve stenosis. Vmax 2.9 m/s, MG 25 mmHg, AVA 1.3  cm^2, DI 0.5  5. Aortic dilatation noted. There is mild dilatation of the ascending aorta, measuring 38 mm.  6. The inferior vena cava is normal in size with greater than 50% respiratory variability, suggesting right atrial pressure of 3 mmHg. FINDINGS  Left Ventricle: Left ventricular ejection fraction, by estimation, is 60 to 65%. The left ventricle has normal function. The left ventricle has no regional wall motion abnormalities. The left ventricular internal cavity size was small. There is no left ventricular hypertrophy. Left ventricular diastolic parameters are indeterminate. Right Ventricle: The right ventricular size is normal. No increase in right ventricular wall thickness. Right ventricular systolic function is normal. There is normal pulmonary artery systolic pressure. The tricuspid regurgitant velocity is 2.71 m/s, and  with an assumed right atrial pressure of 3 mmHg, the estimated right ventricular systolic pressure is 46.5 mmHg. Left Atrium: Left atrial size was normal in size. Right Atrium: Right atrial size was normal in size. Pericardium: There is no evidence of pericardial effusion. Mitral Valve: The mitral valve is normal in structure. No evidence of mitral valve regurgitation. No evidence of mitral valve stenosis. Tricuspid Valve: The tricuspid valve is normal in structure. Tricuspid valve regurgitation is trivial. Aortic Valve: The aortic valve was not well visualized. Aortic valve regurgitation is trivial. Aortic regurgitation PHT measures 856 msec. Moderate aortic stenosis is present. Aortic valve mean  gradient measures 18.2 mmHg. Aortic valve peak gradient measures 28.9 mmHg. Aortic valve area, by VTI measures 1.46 cm. Pulmonic Valve: The pulmonic valve was not well visualized. Pulmonic valve regurgitation is trivial. Aorta: The aortic root is normal in size and structure and aortic dilatation noted. There is mild dilatation of the ascending aorta, measuring 38 mm. Venous: The inferior vena cava is normal in size with greater than 50% respiratory variability, suggesting right atrial pressure of 3 mmHg. IAS/Shunts: The interatrial septum was not well visualized.  LEFT VENTRICLE PLAX 2D LVIDd:         3.20 cm   Diastology LVIDs:         2.00 cm   LV e' medial:    5.44 cm/s LV PW:         0.90 cm   LV E/e' medial:  13.8 LV IVS:        0.70 cm   LV e' lateral:   9.25 cm/s LVOT diam:     1.80 cm   LV E/e' lateral: 8.1 LV SV:         82 LV SV Index:   44 LVOT Area:     2.54 cm  RIGHT VENTRICLE             IVC RV Basal diam:  3.00 cm     IVC diam: 1.70 cm RV Mid diam:    1.90 cm RV S prime:     10.00 cm/s TAPSE (M-mode): 1.9 cm LEFT ATRIUM             Index        RIGHT ATRIUM           Index LA diam:        2.90 cm 1.54 cm/m   RA Area:     13.70 cm LA Vol (A2C):   20.4 ml 10.85 ml/m  RA Volume:   31.40 ml  16.71 ml/m LA Vol (A4C):   40.0 ml 21.28 ml/m LA Biplane Vol: 28.7 ml 15.27 ml/m  AORTIC VALVE  PULMONIC VALVE AV Area (Vmax):    1.29 cm      PV Vmax:       1.03 m/s AV Area (Vmean):   1.31 cm      PV Peak grad:  4.2 mmHg AV Area (VTI):     1.46 cm AV Vmax:           268.80 cm/s AV Vmean:          199.800 cm/s AV VTI:            0.565 m AV Peak Grad:      28.9 mmHg AV Mean Grad:      18.2 mmHg LVOT Vmax:         136.00 cm/s LVOT Vmean:        103.000 cm/s LVOT VTI:          0.323 m LVOT/AV VTI ratio: 0.57 AI PHT:            856 msec  AORTA Ao Root diam: 3.70 cm Ao Asc diam:  3.80 cm MITRAL VALVE               TRICUSPID VALVE MV Area (PHT): 2.86 cm    TR Peak grad:   29.4 mmHg MV Decel  Time: 265 msec    TR Vmax:        271.00 cm/s MV E velocity: 75.00 cm/s MV A velocity: 81.80 cm/s  SHUNTS MV E/A ratio:  0.92        Systemic VTI:  0.32 m                            Systemic Diam: 1.80 cm Oswaldo Milian MD Electronically signed by Oswaldo Milian MD Signature Date/Time: 08/11/2021/3:47:22 PM    Final    MR BRAIN WO CONTRAST  Result Date: 08/10/2021 CLINICAL DATA:  Acute neurologic deficit EXAM: MRI HEAD WITHOUT CONTRAST TECHNIQUE: Multiplanar, multiecho pulse sequences of the brain and surrounding structures were obtained without intravenous contrast. COMPARISON:  03/31/2020 FINDINGS: Brain: No acute infarct, mass effect or extra-axial collection. No acute or chronic hemorrhage. There is confluent hyperintense T2-weighted signal within the white matter. Generalized volume loss. The midline structures are normal. Vascular: Major flow voids are preserved. Skull and upper cervical spine: Normal calvarium and skull base. Visualized upper cervical spine and soft tissues are normal. Sinuses/Orbits:No paranasal sinus fluid levels or advanced mucosal thickening. No mastoid or middle ear effusion. Normal orbits. IMPRESSION: 1. No acute intracranial abnormality. 2. Findings of chronic ischemic microangiopathy and generalized volume loss. Electronically Signed   By: Ulyses Jarred M.D.   On: 08/10/2021 19:19   CT ANGIO HEAD NECK W WO CM (CODE STROKE)  Result Date: 08/09/2021 CLINICAL DATA:  Stroke follow-up EXAM: CT ANGIOGRAPHY HEAD AND NECK TECHNIQUE: Multidetector CT imaging of the head and neck was performed using the standard protocol during bolus administration of intravenous contrast. Multiplanar CT image reconstructions and MIPs were obtained to evaluate the vascular anatomy. Carotid stenosis measurements (when applicable) are obtained utilizing NASCET criteria, using the distal internal carotid diameter as the denominator. RADIATION DOSE REDUCTION: This exam was performed according to the  departmental dose-optimization program which includes automated exposure control, adjustment of the mA and/or kV according to patient size and/or use of iterative reconstruction technique. CONTRAST:  76m OMNIPAQUE IOHEXOL 350 MG/ML SOLN COMPARISON:  CT head 08/09/2021 FINDINGS: CTA NECK FINDINGS Aortic arch: Standard branching. Imaged portion shows no evidence of aneurysm  or dissection. No significant stenosis of the major arch vessel origins. Right carotid system: Mild atherosclerotic calcification right carotid bifurcation. Negative for stenosis Left carotid system: Mild atherosclerotic calcification left carotid bifurcation. Negative for stenosis. Vertebral arteries: Both vertebral arteries widely patent without stenosis. Skeleton: Mild cervical spondylosis.  No acute abnormality Other neck: Negative for mass or adenopathy in the neck. Upper chest: Lung apices clear bilaterally Review of the MIP images confirms the above findings CTA HEAD FINDINGS Anterior circulation: Mild atherosclerotic calcification in the cavernous carotid bilaterally without significant stenosis. Anterior and middle cerebral arteries patent. No significant stenosis or large vessel occlusion. Posterior circulation: Both vertebral arteries patent to the basilar. Right PICA patent. Left PICA not visualized but there is a large left AICA. Basilar widely patent. Superior cerebellar and posterior cerebral arteries patent bilaterally without stenosis or large vessel occlusion. Venous sinuses: Normal venous enhancement. Anatomic variants: None Review of the MIP images confirms the above findings IMPRESSION: 1. Negative for intracranial large vessel occlusion or significant stenosis 2. No significant carotid or vertebral artery stenosis in the neck. Mild atherosclerotic disease of the carotid bifurcation bilaterally. Electronically Signed   By: Franchot Gallo M.D.   On: 08/09/2021 17:50   CT HEAD CODE STROKE WO CONTRAST  Result Date:  08/09/2021 CLINICAL DATA:  Code stroke. Neuro deficit, acute, stroke suspected; dizziness and right-sided weakness EXAM: CT HEAD WITHOUT CONTRAST TECHNIQUE: Contiguous axial images were obtained from the base of the skull through the vertex without intravenous contrast. RADIATION DOSE REDUCTION: This exam was performed according to the departmental dose-optimization program which includes automated exposure control, adjustment of the mA and/or kV according to patient size and/or use of iterative reconstruction technique. COMPARISON:  08/03/2021 FINDINGS: Brain: There is no acute intracranial hemorrhage, mass effect, or edema. No new loss of gray-white differentiation. Patchy and confluent areas of low-density in the supratentorial white matter nonspecific but probably reflect similar moderate to advanced chronic microvascular ischemic changes. Prominence of the ventricles and sulci reflects stable parenchymal volume loss. No extra-axial collection. Vascular: No hyperdense vessel. There is intracranial atherosclerotic calcification at the skull base. Skull: Unremarkable. Sinuses/Orbits: No acute finding. Other: Mastoid air cells are clear. ASPECTS ([place] Stroke Program Early CT Score) - Ganglionic level infarction (caudate, lentiform nuclei, internal capsule, insula, M1-M3 cortex): 7 - Supraganglionic infarction (M4-M6 cortex): 3 Total score (0-10 with 10 being normal): 10 IMPRESSION: There is no acute intracranial hemorrhage or evidence of acute infarction. ASPECT score is 10. Stable chronic/nonemergent findings detailed above. These results were called by telephone at the time of interpretation on 08/09/2021 at 5:03 pm to provider [facility] , who verbally acknowledged these results. Electronically Signed   By: Macy Mis M.D.   On: 08/09/2021 17:07   CT Chest Wo Contrast  Result Date: 08/03/2021 CLINICAL DATA:  87 year old male with history of trauma from a fall complaining of right-sided rib pain.  EXAM: CT CHEST WITHOUT CONTRAST TECHNIQUE: Multidetector CT imaging of the chest was performed following the standard protocol without IV contrast. RADIATION DOSE REDUCTION: This exam was performed according to the departmental dose-optimization program which includes automated exposure control, adjustment of the mA and/or kV according to patient size and/or use of iterative reconstruction technique. COMPARISON:  Chest CTA 11/20/2009. FINDINGS: Cardiovascular: Heart size is normal. There is no significant pericardial fluid, thickening or pericardial calcification. There is aortic atherosclerosis, as well as atherosclerosis of the great vessels of the mediastinum and the coronary arteries, including calcified atherosclerotic plaque in the left main,  left anterior descending, left circumflex and right coronary arteries. Severe calcifications of the aortic valve. Mediastinum/Nodes: No pathologically enlarged mediastinal or hilar lymph nodes. Please note that accurate exclusion of hilar adenopathy is limited on noncontrast CT scans. Esophagus is unremarkable in appearance. No axillary lymphadenopathy. Lungs/Pleura: No acute consolidative airspace disease. No pleural effusions. No suspicious appearing pulmonary nodules or masses are noted. Areas of ground-glass attenuation, septal thickening, regional architectural distortion and mild subpleural reticulation are noted in the lung bases bilaterally. Upper Abdomen: Aortic atherosclerosis. Low-attenuation lesions in the liver, incompletely characterized on today's non-contrast CT examination, but statistically likely cysts, largest of which measures up to 2.4 cm in diameter in segment 2. Numerous coarse calcifications scattered throughout the visualized pancreatic parenchyma, presumably sequela of prior episodes of pancreatitis. 1 cm rim calcified structure adjacent to the splenic hilum, presumably a small splenic artery aneurysm. Musculoskeletal: There are no acute  displaced fractures or aggressive appearing lytic or blastic lesions noted in the visualized portions of the skeleton. IMPRESSION: 1. No evidence of significant acute traumatic injury to the thorax. 2. The appearance of the lungs may suggest interstitial lung disease. Outpatient referral to Pulmonology for further clinical evaluation is recommended. At this time, imaging findings are considered indeterminate for usual interstitial pneumonia (UIP) per current ATS guidelines. 3. Aortic atherosclerosis, in addition to left main and three-vessel coronary artery disease. 4. There are severe calcifications of the aortic valve. Echocardiographic correlation for evaluation of potential valvular dysfunction may be warranted if clinically indicated. 5. Additional incidental findings, as above. Aortic Atherosclerosis (ICD10-I70.0). Electronically Signed   By: Vinnie Langton M.D.   On: 08/03/2021 08:11   CT Head Wo Contrast  Result Date: 08/03/2021 CLINICAL DATA:  Head trauma, minor (Age >= 65y); Neck trauma (Age >= 65y) EXAM: CT HEAD WITHOUT CONTRAST CT CERVICAL SPINE WITHOUT CONTRAST TECHNIQUE: Multidetector CT imaging of the head and cervical spine was performed following the standard protocol without intravenous contrast. Multiplanar CT image reconstructions of the cervical spine were also generated. RADIATION DOSE REDUCTION: This exam was performed according to the departmental dose-optimization program which includes automated exposure control, adjustment of the mA and/or kV according to patient size and/or use of iterative reconstruction technique. COMPARISON:  None Available. FINDINGS: CT HEAD FINDINGS Brain: No evidence of acute infarction, hemorrhage, hydrocephalus, extra-axial collection or mass lesion/mass effect. Patchy white matter hypoattenuation, nonspecific but compatible with chronic microvascular ischemic disease. Vascular: Calcific intracranial atherosclerosis. Skull: No acute fracture. Sinuses/Orbits:  Clear sinuses.  No acute orbital findings. Other: No mastoid effusions. CT CERVICAL SPINE FINDINGS Alignment: Straightening of the normal cervical lordosis. No substantial sagittal subluxation. Skull base and vertebrae: Vertebral body heights are maintained. No evidence of acute fracture. Osteopenia. Soft tissues and spinal canal: No prevertebral fluid or swelling. No visible canal hematoma. Disc levels:  Moderate multilevel degenerative change. Upper chest: Visualized lung apices are clear. IMPRESSION: 1. No evidence of acute intracranial abnormality. 2. No evidence of acute fracture or traumatic malalignment in the cervical spine. Electronically Signed   By: Margaretha Sheffield M.D.   On: 08/03/2021 08:10   CT Cervical Spine Wo Contrast  Result Date: 08/03/2021 CLINICAL DATA:  Head trauma, minor (Age >= 65y); Neck trauma (Age >= 65y) EXAM: CT HEAD WITHOUT CONTRAST CT CERVICAL SPINE WITHOUT CONTRAST TECHNIQUE: Multidetector CT imaging of the head and cervical spine was performed following the standard protocol without intravenous contrast. Multiplanar CT image reconstructions of the cervical spine were also generated. RADIATION DOSE REDUCTION: This exam was performed according  to the departmental dose-optimization program which includes automated exposure control, adjustment of the mA and/or kV according to patient size and/or use of iterative reconstruction technique. COMPARISON:  None Available. FINDINGS: CT HEAD FINDINGS Brain: No evidence of acute infarction, hemorrhage, hydrocephalus, extra-axial collection or mass lesion/mass effect. Patchy white matter hypoattenuation, nonspecific but compatible with chronic microvascular ischemic disease. Vascular: Calcific intracranial atherosclerosis. Skull: No acute fracture. Sinuses/Orbits: Clear sinuses.  No acute orbital findings. Other: No mastoid effusions. CT CERVICAL SPINE FINDINGS Alignment: Straightening of the normal cervical lordosis. No substantial sagittal  subluxation. Skull base and vertebrae: Vertebral body heights are maintained. No evidence of acute fracture. Osteopenia. Soft tissues and spinal canal: No prevertebral fluid or swelling. No visible canal hematoma. Disc levels:  Moderate multilevel degenerative change. Upper chest: Visualized lung apices are clear. IMPRESSION: 1. No evidence of acute intracranial abnormality. 2. No evidence of acute fracture or traumatic malalignment in the cervical spine. Electronically Signed   By: Margaretha Sheffield M.D.   On: 08/03/2021 08:10    Microbiology: Results for orders placed or performed in visit on 01/08/18  Fecal occult blood, imunochemical     Status: None   Collection Time: 01/09/18 10:55 AM   Specimen: Stool  Result Value Ref Range Status   Fecal Occult Bld Negative Negative Final    Labs: CBC: Recent Labs  Lab 08/09/21 1705 08/11/21 0149  WBC 4.3 5.2  NEUTROABS 2.0  --   HGB 16.1 16.1  HCT 44.6 44.2  MCV 96.1 94.0  PLT 158 532*   Basic Metabolic Panel: Recent Labs  Lab 08/09/21 1705 08/11/21 0149  NA 139 140  K 3.9 3.6  CL 105 106  CO2 29 28  GLUCOSE 100* 103*  BUN 25* 12  CREATININE 1.13 0.92  CALCIUM 9.3 9.3   Liver Function Tests: Recent Labs  Lab 08/09/21 1705  AST 23  ALT 23  ALKPHOS 57  BILITOT 0.9  PROT 7.1  ALBUMIN 3.8   CBG: Recent Labs  Lab 08/09/21 1647  GLUCAP 100*    Discharge time spent: greater than 30 minutes.  Signed: Patrecia Pour, MD [facility] 08/11/2021

## 2021-08-11 NOTE — TOC Transition Note (Signed)
Transition of Care [facility]) - CM/SW Discharge Note   Patient Details  Name: Shawn [name] MRN: [id number] Date of Birth: 1934/12/11  [facility] CM/SW Contact:  Pollie Friar, RN Phone Number: 08/11/2021, 2:45 PM   Clinical Narrative:    Patient is discharging home with outpatient therapy [facility]. Information on the AVS. Pt is from home with his spouse. Pt manages his own medications and denies any issues. Pt was driving prior to admission but plans on letting his spouse provide needed transport. No DME at home.  Pt has transportation home today.   Final next level of care: OP Rehab Barriers to Discharge: No Barriers Identified   Patient Goals and CMS Choice     Choice offered to / list presented to : Patient, Spouse  Discharge Placement                       Discharge Plan and Services                                     Social Determinants of Health (SDOH) Interventions     Readmission Risk Interventions     No data to display

## 2021-08-11 NOTE — Progress Notes (Signed)
Echocardiogram 2D Echocardiogram has been performed.  Shawn [name] 08/11/2021, 11:09 AM

## 2021-08-11 NOTE — Evaluation (Signed)
Physical Therapy Evaluation Patient Details Name: Shawn [name] MRN: [id number] DOB: 1934/05/25 Today's Date: 08/11/2021  History of Present Illness  Pt is an 87 y.o. male who presented to the ED with c/o RUE incoordination and gait instability. MRI negative. 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 Mod I bed mobility and transfers. Supervision amb 500' without AD and ascend/descend 5 steps with rails. Jerking/twitching movement noted at rest but not during mobility. Pt scored 21/24 on DGI. No further acute care PT services indicated. Recommend OPPT for higher level balance activities. PT signing off.        Recommendations for follow up therapy are one component of a multi-disciplinary discharge planning process, led by the attending physician.  Recommendations may be updated based on patient status, additional functional criteria and insurance authorization.  Follow Up Recommendations Outpatient PT      Assistance Recommended at Discharge PRN  Patient can return home with the following       Equipment Recommendations None recommended by PT  Recommendations for Other Services       Functional Status Assessment Patient has had a recent decline in their functional status and demonstrates the ability to make significant improvements in function in a reasonable and predictable amount of time.     Precautions / Restrictions Precautions Precautions: Fall Precaution Comments: Recent fall down flight of stairs in [facility] home while vacationing in [place]. Xrays negative.      Mobility  Bed Mobility Overal bed mobility: Modified Independent                  Transfers Overall transfer level: Modified independent Equipment used: None                    Ambulation/Gait Ambulation/Gait assistance: Supervision Gait Distance (Feet): 500 Feet Assistive device: None Gait Pattern/deviations:  Step-through pattern Gait velocity: mildly decreased Gait velocity interpretation: 1.31 - 2.62 ft/sec, indicative of limited community ambulator   General Gait Details: steady gait without AD  Stairs Stairs: Yes Stairs assistance: Supervision Stair Management: Two rails, Forwards, Alternating pattern Number of Stairs: 5    Wheelchair Mobility    Modified Rankin (Stroke Patients Only) Modified Rankin (Stroke Patients Only) Pre-Morbid Rankin Score: No symptoms Modified Rankin: No symptoms     Balance Overall balance assessment: Needs assistance Sitting-balance support: No upper extremity supported, Feet supported Sitting balance-Leahy Scale: Good     Standing balance support: No upper extremity supported, During functional activity Standing balance-Leahy Scale: Good                   Standardized Balance Assessment Standardized Balance Assessment : Dynamic Gait Index   Dynamic Gait Index Level Surface: Normal Change in Gait Speed: Normal Gait with Horizontal Head Turns: Mild Impairment Gait with Vertical Head Turns: Normal Gait and Pivot Turn: Mild Impairment Step Over Obstacle: Normal Step Around Obstacles: Normal Steps: Mild Impairment Total Score: 21       Pertinent Vitals/Pain Pain Assessment Pain Assessment: Faces Faces Pain Scale: Hurts a little bit Pain Location: low back, R hip Pain Descriptors / Indicators: Discomfort (stiffness from recent fall) Pain Intervention(s): Monitored during session    Home Living Family/patient expects to be discharged to:: Private residence Living Arrangements: Spouse/significant other Available Help at Discharge: Family;Available 24 hours/day Type of Home: House Home Access: Level entry       Home Layout: One level Home  Equipment: None      Prior Function Prior Level of Function : Independent/Modified Independent               ADLs Comments: wife has been attempting to stop him from driving for  appox 1 year but pt still drives on occasion     Hand Dominance   Dominant Hand: Right    Extremity/Trunk Assessment   Upper Extremity Assessment Upper Extremity Assessment: Defer to OT evaluation RUE Deficits / Details: Resting tremors. decreased coordination as seen during finger to nose and opposition compared to L RUE Coordination: decreased fine motor LUE Deficits / Details: Resting tremors - Parkinson's like    Lower Extremity Assessment Lower Extremity Assessment: Overall WFL for tasks assessed    Cervical / Trunk Assessment Cervical / Trunk Assessment: Kyphotic  Communication   Communication: No difficulties  Cognition Arousal/Alertness: Awake/alert Behavior During Therapy: WFL for tasks assessed/performed Overall Cognitive Status: Impaired/Different from baseline Area of Impairment: Memory, Problem solving, Awareness                     Memory: Decreased short-term memory     Awareness: Emergent Problem Solving: Slow processing, Requires verbal cues          General Comments General comments (skin integrity, edema, etc.): Wife and son present    Exercises     Assessment/Plan    PT Assessment All further PT needs can be met in the next venue of care  PT Problem List Decreased balance;Pain       PT Treatment Interventions      PT Goals (Current goals can be found in the Care Plan section)  Acute Rehab PT Goals Patient Stated Goal: home PT Goal Formulation: All assessment and education complete, DC therapy    Frequency       Co-evaluation               AM-PAC PT "6 Clicks" Mobility  Outcome Measure Help needed turning from your back to your side while in a flat bed without using bedrails?: None Help needed moving from lying on your back to sitting on the side of a flat bed without using bedrails?: None Help needed moving to and from a bed to a chair (including a wheelchair)?: None Help needed standing up from a chair using your  arms (e.g., wheelchair or bedside chair)?: None Help needed to walk in hospital room?: A Little Help needed climbing 3-5 steps with a railing? : A Little 6 Click Score: 22    End of Session Equipment Utilized During Treatment: Gait belt Activity Tolerance: Patient tolerated treatment well Patient left: in chair;with call bell/phone within reach;with chair alarm set;with family/visitor present Nurse Communication: Mobility status PT Visit Diagnosis: Unsteadiness on feet (R26.81)    Time: 8527-7824 PT Time Calculation (min) (ACUTE ONLY): 21 min   Charges:   PT Evaluation $PT Eval Moderate Complexity: 1 Mod          Lorrin Goodell, PT  Office # [phone] Pager [phone]   Lorriane Shire 08/11/2021, 10:44 AM

## 2021-08-11 NOTE — Evaluation (Signed)
Speech Language Pathology Evaluation Patient Details Name: Shawn [name] MRN: [id number] DOB: Jul 28, 1934 Today's Date: 08/11/2021 Time: 8101-7510 SLP Time Calculation (min) (ACUTE ONLY): 28 min  Problem List:  Patient Active Problem List   Diagnosis Date Noted   Ataxia of right upper extremity 08/09/2021   Otitis externa 07/07/2021   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 ([place]) 03/18/2020   High serum vitamin D 01/12/2020   Pain of left thumb 02/20/2016   Preventative health care 11/13/2013   History of colon polyps 11/02/2011   Bowel habit changes 11/02/2011   Other and unspecified coagulation defects 11/02/2011   Overweight(278.02) 10/05/2010   BACK PAIN 04/21/2010   COLONIC POLYPS, ADENOMATOUS, HX OF 03/22/2010   Factor V deficiency ([place]) 12/01/2009   THROMBOCYTOPENIA 12/01/2009   PULMONARY EMBOLISM, HX OF 12/01/2009   Pulmonary embolism ([place]) 11/20/2009   CERUMEN IMPACTION, RIGHT 07/29/2009   VARICOSE VEINS LOWER EXTREMITIES W/INFLAMMATION 12/01/2008   DVT 09/03/2008   Aortic valve disorder 11/19/2007   HEPATIC CYST 11/19/2007   CALF PAIN, LEFT 11/11/2007   MEMORY LOSS 08/28/2007   MOLE 08/11/2007   Hyperlipidemia 09/04/2006   OSTEOARTHRITIS 09/04/2006   DYSPHAGIA, UNSPECIFIED 09/04/2006   Past Medical History:  Past Medical History:  Diagnosis Date   Allergy    Cardiac murmur    mild aortic sclerosis   Cataract    left eye   Clotting disorder ([place])    Factor v Leiden factor mutation   Colon polyps    Hyperplastic   Diverticulosis    Dysphagia    Factor V Leiden mutation ([place])    Hemorrhoids    History of blood clots    Hyperlipidemia    Osteoarthritis    Past Surgical History:  Past Surgical History:  Procedure Laterality Date   APPENDECTOMY     CATARACT EXTRACTION  02/09/2007   right eye   INGUINAL HERNIA REPAIR     TONSILLECTOMY     VARICOSE VEIN SURGERY     VASECTOMY     HPI:  Shawn [name] is a 87  y.o. 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    Patient states he fell down a flight of stairs 2 weeks ago including hitting his head.  He did have imaging performed with CT head/C-spine on 6/29 which was negative for acute intracranial abnormality or cervical spine abnormality.    Around 3-4 PM on 7/5 he developed new onset right hand incoordination when he was attempting to pick up his glasses.  He then noticed difficulty ambulating and states he felt off balance.  He denied any difficulty with coordination or strength in his right leg.MRI brain negative for acute processes.  SLE (Speech/language evaluation) ordered.   Assessment / Plan / Recommendation Clinical Impression  Pt assessed via the [place] Mental Status Examination (SLUMS) with a score obtained of 13/30 (normal score 27/30) with deficits noted within attention/memory and orientation tasks. Memory affecting auditory comprehension  and retention of paragraph, along with object recall after a time delay.  He was only able to immediately recall 3/5 objects.  OME and speech intelligibility WFL.  Pt with baseline cognitive deficits primarily related to memory recall/STM per son/wife's report.  ST will not f/u in acute setting due to pt returning to baseline cognitive status  per family report as he was having issues with above mentioned deficits prior to hospitalization.  If symptoms persist, a full cognitive assessment may be beneficial.  Thank you for this consult.    SLP Assessment  SLP Recommendation/Assessment: All further Speech Language Pathology  needs can be addressed in the next venue of care SLP Visit Diagnosis: Cognitive communication deficit (R41.841)    Recommendations for follow up therapy are one component  of a multi-disciplinary discharge planning process, led by the attending physician.  Recommendations may be updated based on patient status, additional functional criteria and insurance authorization.    Follow Up Recommendations  Follow physician's recommendations for discharge plan and follow up therapies    Assistance Recommended at Discharge  Frequent or constant Supervision/Assistance  Functional Status Assessment  Pt has returned to baseline cognitive function per family report  Frequency and Duration  (evaluation only)         SLP Evaluation Cognition  Overall Cognitive Status: History of cognitive impairments - at baseline Arousal/Alertness: Awake/alert Orientation Level: Oriented to person;Disoriented to place;Disoriented to time;Disoriented to situation Year: 2024 Month: July Day of Week: Correct Attention: Sustained Sustained Attention: Impaired Sustained Attention Impairment: Verbal basic;Functional basic Memory: Impaired Memory Impairment: Retrieval deficit;Decreased recall of new information;Decreased short term memory Decreased Short Term Memory: Verbal basic;Functional basic Behaviors: Perseveration Safety/Judgment: Other (comment) Comments: DTA; pt with min-mod memory defictis       Comprehension  Visual Recognition/Discrimination Discrimination: Within Function Limits (for environmental signs) Reading Comprehension Reading Status: Not tested    Expression Expression Primary Mode of Expression: Verbal Verbal Expression Overall Verbal Expression: Other (comment) (affected by memory deficits; anomia present intermittently) Level of Generative/Spontaneous Verbalization: Conversation Repetition: Impaired Level of Impairment: Sentence level Naming: Not tested [employer] of Communication: Not applicable Written Expression Dominant Hand: Right Written Expression: Unable to assess (comment) (R ataxia/incoordination present)   Oral / Motor  Oral  Motor/Sensory Function Overall Oral Motor/Sensory Function: Within functional limits Motor Speech Overall Motor Speech: Appears within functional limits for tasks assessed Respiration: Within functional limits Phonation: Normal Resonance: Within functional limits Articulation: Within functional limitis Intelligibility: Intelligible Motor Planning: Witnin functional limits Motor Speech Errors: Not applicable            Elvina Sidle, M.S., CCC-SLP 08/11/2021, 1:07 PM

## 2021-08-14 ENCOUNTER — Telehealth: Payer: Self-pay | Admitting: Family Medicine

## 2021-08-14 ENCOUNTER — Telehealth: Payer: Self-pay | Admitting: Neurology

## 2021-08-14 NOTE — Telephone Encounter (Signed)
Pls let wife know I reviewed notes, no medication changes needed. How is he feeling?

## 2021-08-14 NOTE — Telephone Encounter (Signed)
Contact Type Call Who Is Calling Patient / Member / Family / Caregiver Call Type Triage / Clinical Caller Name Coulson Wehner Relationship To Patient Spouse Return Phone Number [phone] (Primary) Chief Complaint WEAKNESS - sudden on one side of face or body Reason for Call Symptomatic / Request for [place] states she made an appt for her spouse for tomorrow following his release from the hospital for a TIA but his weakness on the right side is worse this morning Translation No Nurse Assessment Nurse: [place], RN, Estill Bamberg Date/Time (Eastern Time): 08/14/2021 8:15:45 AM Confirm and document reason for call. If symptomatic, describe symptoms. ---caller states her husband got release friday after having a TIA. says right hand is not functioning correctly, lifting foot is harder, leaning to the left.  08/14/2021 8:18:26 AM Call EMS 911 Now Yes [place], RN, Estill Bamberg

## 2021-08-14 NOTE — Telephone Encounter (Signed)
Spoke with wife and she stated that she called 911 and patient declined to go to hospital but he is now doing a little better.  His right side is just weak.  He was in the hospital last weak for TIA.  He has an appointment scheduled tomorrow for hospital follow up.  Advised that if he gets worse again it will be best for him to go back to hospital.

## 2021-08-14 NOTE — Telephone Encounter (Signed)
Pt's wife called in stating the pt was in the hospital last week a few days for a TIA. She also stated he fell down the stairs on 07/28/21, but luckily they said nothing was wrong with him. She wants Dr. Delice Lesch to be aware and wants to make sure medications or anything didn't need to be adjusted.

## 2021-08-14 NOTE — Telephone Encounter (Signed)
Pt triaged for right side pain, following leaving the hospital.

## 2021-08-14 NOTE — Telephone Encounter (Signed)
Noted. FYI 

## 2021-08-15 ENCOUNTER — Ambulatory Visit (INDEPENDENT_AMBULATORY_CARE_PROVIDER_SITE_OTHER): Payer: Medicare HMO | Admitting: Medical

## 2021-08-15 ENCOUNTER — Telehealth: Payer: Self-pay | Admitting: Neurology

## 2021-08-15 VITALS — BP 127/64 | HR 73 | Resp 18 | Ht 66.0 in | Wt 166.0 lb

## 2021-08-15 DIAGNOSIS — R5383 Other fatigue: Secondary | ICD-10-CM

## 2021-08-15 DIAGNOSIS — R351 Nocturia: Secondary | ICD-10-CM

## 2021-08-15 LAB — T4, FREE: Free T4: 0.81 ng/dL (ref 0.60–1.60)

## 2021-08-15 LAB — CBC WITH DIFFERENTIAL/PLATELET
Basophils Absolute: 0 10*3/uL (ref 0.0–0.1)
Basophils Relative: 0.4 % (ref 0.0–3.0)
Eosinophils Absolute: 0.1 10*3/uL (ref 0.0–0.7)
Eosinophils Relative: 1.4 % (ref 0.0–5.0)
HCT: 48 % (ref 39.0–52.0)
Hemoglobin: 16.6 g/dL (ref 13.0–17.0)
Lymphocytes Relative: 24.9 % (ref 12.0–46.0)
Lymphs Abs: 1.3 10*3/uL (ref 0.7–4.0)
MCHC: 34.6 g/dL (ref 30.0–36.0)
MCV: 99.3 fl (ref 78.0–100.0)
Monocytes Absolute: 0.5 10*3/uL (ref 0.1–1.0)
Monocytes Relative: 10.6 % (ref 3.0–12.0)
Neutro Abs: 3.2 10*3/uL (ref 1.4–7.7)
Neutrophils Relative %: 62.7 % (ref 43.0–77.0)
Platelets: 155 10*3/uL (ref 150.0–400.0)
RBC: 4.83 Mil/uL (ref 4.22–5.81)
RDW: 13 % (ref 11.5–15.5)
WBC: 5.1 10*3/uL (ref 4.0–10.5)

## 2021-08-15 LAB — COMPREHENSIVE METABOLIC PANEL
ALT: 22 U/L (ref 0–53)
AST: 21 U/L (ref 0–37)
Albumin: 4.6 g/dL (ref 3.5–5.2)
Alkaline Phosphatase: 69 U/L (ref 39–117)
BUN: 19 mg/dL (ref 6–23)
CO2: 34 mEq/L — ABNORMAL HIGH (ref 19–32)
Calcium: 10.2 mg/dL (ref 8.4–10.5)
Chloride: 99 mEq/L (ref 96–112)
Creatinine, Ser: 0.97 mg/dL (ref 0.40–1.50)
GFR: 71.65 mL/min (ref >60.00)
Glucose, Bld: 89 mg/dL (ref 70–99)
Potassium: 4.7 mEq/L (ref 3.5–5.1)
Sodium: 139 mEq/L (ref 135–145)
Total Bilirubin: 1.1 mg/dL (ref 0.2–1.2)
Total Protein: 7.2 g/dL (ref 6.0–8.3)

## 2021-08-15 LAB — TSH: TSH: 2.08 u[IU]/mL (ref 0.35–5.50)

## 2021-08-15 LAB — VITAMIN B12: Vitamin B-12: >1504 pg/mL — ABNORMAL HIGH (ref 211–911)

## 2021-08-15 LAB — PSA: PSA: 2.9 ng/mL (ref 0.10–4.00)

## 2021-08-15 MED ORDER — ROSUVASTATIN CALCIUM 5 MG PO TABS: 5.0000 mg | ORAL_TABLET | Freq: Every day | ORAL | 3 refills | Status: DC

## 2021-08-15 NOTE — Telephone Encounter (Signed)
Patient is starting PT on Monday. Called wife back and told her your recommendations and to continue to monitor patient and wait on the labwork to come back

## 2021-08-15 NOTE — Patient Instructions (Addendum)
TIA: Suspected embolic. Good neurologic exam today on exam but some poor coordination of rt upper ext and gait disturbance. Glad to hear continued improvement. Follow up with PT/discuss with them today schedule.  Continue eliquis, start rosuvastatin '5mg'$  daily.  Ascending aortic dilatation: 50m by echo on 7/7.  - Serial monitoring recommended,    History of PE, Factor V Leiden - Resume home eliquis  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 ([facility]) - Continue donepezil.  Hyperlipidemia - continue crestor. No side effects reported.  Rt rib pain. Getting better from fall. Xray declined by pt.  Frequent urination recently. Will get psa and urine culture.  For fatigue- cbc, cmp, tsh, t4, b12 and b1 level.  Follow up in 3 weeks with Dr. LEtter Sjogrenor sooner if needed.

## 2021-08-15 NOTE — Telephone Encounter (Signed)
Pt's wife called in stating the pt has been very weak ever since 08/09/21. She said all the pt's tests at the hospital were negative, but she is worried about his weakness and what might be causing it.

## 2021-08-15 NOTE — Telephone Encounter (Signed)
He saw PCP today and bloodwork has been ordered, agree with bloodwork first. If bloodwork normal, most likely deconditioning from being in the hospital and may take some time to bounce back. Does he have home PT ordered from the hospital?

## 2021-08-15 NOTE — Progress Notes (Signed)
Subjective:    Patient ID: Shawn [name], male    DOB: April 25, 1934, 87 y.o.   MRN: [id number]  HPI  Admit date:     08/09/2021  Discharge date: 08/11/21  Discharge Physician: Patrecia Pour    PCP: Ann Held, DO    Recommendations at discharge:  Follow up with neurology as outpatient.  Monitor lipid panel, LFTs, tolerance of rosuvastatin '5mg'$  daily.    Discharge Diagnoses: Principal Problem:   Ataxia of right upper extremity Active Problems:   PULMONARY EMBOLISM, HX OF   Hyperlipidemia   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 [facility])   Hospital Course: Shawn [name] is a 87 y.o. 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   Work up, including MR brain revealed no acute stroke. Neurological deficits have returned to baseline. Stroke neurology evaluated the patient, recommending continuing eliquis, and starting statin for LDL of 107 and suspected embolic posterior circulation TIA.   Assessment and Plan: TIA: Suspected embolic in setting of hypercoagulable state, posterior circulation based on symptoms. Ataxia of right upper extremity. Gait imbalance New onset RUE ataxia and gait imbalance 3-4 PM on 7/5.  CT head, CTA head/neck negative for acute intracranial abnormality or LVO.  Neurology recommended admission for further CVA work-up. Symptoms have since resolved, he's back to baseline, with no stroke noted on MRI brain.  - Continue eliquis, start rosuvastatin '5mg'$  for LDL 107 - HbA1c 4.8%  - Echo shows no CES or shunts (IAS no well visualized).  - No plan to initiate outpatient cardiac monitoring with indefinite indication for anticoagulation regardless.  - Outpatient PT arranged, no further recommendations per OT and SLP at this time.    Moderate aortic  stenosis: Echo showed Vmax 2.9 m/s, MG 25 mmHg, AVA  1.3 cm^2, DI 0.5  - Does not appear symptomatic, follow up with PCP recommended.    Ascending aortic dilatation: 27m by echo on 7/7.  - Serial monitoring recommended, written in patient instructions.    History of PE, Factor V Leiden - Resume home eliquis   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 (HCC) - Continue donepezil.   Hyperlipidemia - Starting statin (has had "disorientation" with pravastatin in the past, willing (d/w pt, wife, daughter) to trial low dose rosuvastatin. Since starting that, can stop taking the 7 pills/day of fish oil.  Since dc pt feels like his coordination of rt hand is getting better.   Pt has some dementia. On Aricept. Some confusion in the evenings. Some confusion each night before hospitalization. Not new.   Late June- he fell in [place] June 23. Some pain in rt lower rib still present. He has faint bruise present.  Pt also urinating frequently recently. This has been the case since last Wednesday. UA in hospital showed some ketones. Urinating 3 times each night. Usually urinates just one time at night.    Review of Systems  Constitutional:  Negative for chills, fatigue and fever.  Respiratory:  Negative for cough, chest tightness, shortness of breath and wheezing.   Cardiovascular:  Negative for chest pain and palpitations.  Gastrointestinal:  Negative for abdominal distention, abdominal pain, blood in stool, constipation and diarrhea.  Genitourinary:  Negative for dysuria, flank pain, genital sores and penile discharge.  Musculoskeletal:  Negative for back pain.  Skin:  Negative for  rash.  Neurological:  Negative for dizziness, syncope, weakness, numbness and headaches.       See hpi.  Psychiatric/Behavioral:  Negative for behavioral problems, confusion and dysphoric mood.     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    Right handed   One story home   Social Determinants of Health   Financial Resource Strain: Low Risk  (10/18/2020)   Overall Financial Resource Strain (CARDIA)    Difficulty of Paying Living Expenses: Not hard at all  Food Insecurity: No Food Insecurity (10/18/2020)   Hunger Vital Sign    Worried About Running Out of Food in the Last Year: Never true    Ran Out of Food in the Last Year: Never true  Transportation Needs: No Transportation Needs (10/18/2020)   PRAPARE - Hydrologist (Medical): No    Lack of Transportation (Non-Medical): No  Physical Activity: Sufficiently Active (10/18/2020)   Exercise Vital Sign    Days of Exercise per Week: 6 days    Minutes of Exercise per Session: 30 min  Stress: No Stress Concern Present (10/18/2020)   [place]    Feeling of Stress : Not at all  Social Connections: Socially  Isolated (10/18/2020)   Social Connection and Isolation Panel [NHANES]    Frequency of Communication with Friends and Family: Once a week    Frequency of Social Gatherings with Friends and Family: Once a week    Attends Religious Services: Never    Marine scientist or Organizations: No    Attends Archivist Meetings: Never    Marital Status: Married  Human resources officer Violence: Not At Risk (10/18/2020)   Humiliation, Afraid, Rape, and Kick questionnaire    Fear of Current or Ex-Partner: No    Emotionally Abused: No    Physically Abused: No    Sexually Abused: No    Past Surgical History:  Procedure Laterality Date   APPENDECTOMY     CATARACT EXTRACTION  02/09/2007   right eye   INGUINAL HERNIA REPAIR  TONSILLECTOMY     VARICOSE VEIN SURGERY     VASECTOMY      Family History  Problem Relation Age of Onset   Lung cancer Father    Heart attack Mother 33   Heart disease Mother        chf   Arthritis Mother    Colon cancer Neg Hx    Esophageal cancer Neg Hx    Rectal cancer Neg Hx    Stomach cancer Neg Hx     Allergies  Allergen Reactions   Niacin Rash    "My whole body turned red on the prescription dose" He takes the OTC medication   Pravastatin Other (See Comments)    Disorientation    Current Outpatient Medications on File Prior to Visit  Medication Sig Dispense Refill   Cholecalciferol 25 MCG (1000 UT) capsule Take 5,000 Units by mouth daily.     Cyanocobalamin (VITAMIN B-12) 5000 MCG TBDP Take 1 tablet by mouth daily.     donepezil (ARICEPT) 10 MG tablet Take 1 tablet daily 180 tablet 1   ELIQUIS 5 MG TABS tablet TAKE 1 TABLET BY MOUTH TWICE A DAY (Patient taking differently: Take 5 mg by mouth 2 (two) times daily.) 180 tablet 1   Multiple Vitamin (MULTIVITAMIN) tablet Take 1 tablet by mouth daily.     Saw Palmetto 450-15 MG CAPS Take 1 capsule by mouth daily. (Patient taking differently: Take 2 capsules by mouth daily.)     No current  facility-administered medications on file prior to visit.    BP 127/64   Pulse 73   Resp 18   Ht '5\' 6"'$  (1.676 m)   Wt 166 lb (75.3 kg)   SpO2 97%   BMI 26.79 kg/m        Objective:   Physical Exam  General Mental Status- Alert. General Appearance- Not in acute distress.   Skin General: Color- Normal Color. Moisture- Normal Moisture.  Neck Carotid Arteries- Normal color. Moisture- Normal Moisture. No carotid bruits. No JVD.  Chest and Lung Exam Auscultation: Breath Sounds:-Normal.  Cardiovascular Auscultation:Rythm- Regular. Murmurs & Other Heart Sounds:Auscultation of the heart reveals- No Murmurs.  Abdomen Inspection:-Inspeection Normal. Palpation/Percussion:Note:No mass. Palpation and Percussion of the abdomen reveal- Non Tender, Non Distended + BS, no rebound or guarding.   Neurologic Cranial Nerve exam:- CN III-XII intact(No nystagmus), symmetric smile. Drift Test:- No drift. Finger to Nose:- Normal/Intact Strength:- 5/5 equal and symmetric strength both upper and lower extremities.   Rt ribs- faint lower rib bruising but no pain on palpation.    Assessment & Plan:   Patient Instructions  TIA: Suspected embolic. Good neurologic exam today on exam but some poor coordination of rt upper ext and gait disturbance. Glad to hear continued improvement. Follow up with PT/discuss with them today schedule.  Continue eliquis, start rosuvastatin '5mg'$  daily.  Ascending aortic dilatation: 86m by echo on 7/7.  - Serial monitoring recommended,    History of PE, Factor V Leiden - Resume home eliquis  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 (HCC) - Continue donepezil.  Hyperlipidemia - continue crestor. No side effects reported.  Rt rib pain. Getting better from fall. Xray declined by pt.  Frequent urination recently. Will get psa and urine culture.  For fatigue- cbc, cmp, tsh, t4, b12 and b1 level.  Follow up in 3 weeks with Dr. LEtter Sjogrenor  sooner if needed.            EMackie Pai PA-C

## 2021-08-16 LAB — URINE CULTURE
MICRO NUMBER:: 13631006
SPECIMEN QUALITY:: ADEQUATE

## 2021-08-17 NOTE — Therapy (Signed)
OUTPATIENT PHYSICAL THERAPY NEURO EVALUATION   Patient Name: Benford Asch MRN: [id number] DOB:1934/05/05, 87 y.o., male Today's Date: 08/21/2021      PT End of Session - 08/21/21 0846     Visit Number 1    Date for PT Re-Evaluation 10/16/21    Authorization Type [employer]    Progress Note Due on Visit 10    PT Start Time 0846    PT Stop Time 0947    PT Time Calculation (min) 61 min    Activity Tolerance Patient tolerated treatment well    Behavior During Therapy [facility] for tasks assessed/performed             Past Medical History:  Diagnosis Date   Allergy    Cardiac murmur    mild aortic sclerosis   Cataract    left eye   Clotting disorder ([place])    Factor v Leiden factor mutation   Colon polyps    Hyperplastic   Diverticulosis    Dysphagia    Factor V Leiden mutation ([place])    Hemorrhoids    History of blood clots    Hyperlipidemia    Osteoarthritis    Past Surgical History:  Procedure Laterality Date   APPENDECTOMY     CATARACT EXTRACTION  02/09/2007   right eye   [place]     Patient Active Problem List   Diagnosis Date Noted   Ataxia of right upper extremity 08/09/2021   Otitis externa 07/07/2021   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 ([place]) 03/18/2020   High serum vitamin D 01/12/2020   Pain of left thumb 02/20/2016   Preventative health care 11/13/2013   History of colon polyps 11/02/2011   Bowel habit changes 11/02/2011   Other and unspecified coagulation defects 11/02/2011   Overweight(278.02) 10/05/2010   BACK PAIN 04/21/2010   COLONIC POLYPS, ADENOMATOUS, HX OF 03/22/2010   Factor V deficiency ([place]) 12/01/2009   THROMBOCYTOPENIA 12/01/2009   PULMONARY EMBOLISM, HX OF 12/01/2009   Pulmonary embolism ([place]) 11/20/2009   CERUMEN IMPACTION, RIGHT 07/29/2009   VARICOSE VEINS LOWER EXTREMITIES W/INFLAMMATION 12/01/2008   DVT  09/03/2008   Aortic valve disorder 11/19/2007   HEPATIC CYST 11/19/2007   CALF PAIN, LEFT 11/11/2007   MEMORY LOSS 08/28/2007   MOLE 08/11/2007   Hyperlipidemia 09/04/2006   OSTEOARTHRITIS 09/04/2006   DYSPHAGIA, UNSPECIFIED 09/04/2006    PCP: Ann Held, DO  REFERRING PROVIDER: Patrecia Pour, MD  REFERRING DIAG: R27.0 (ICD-10-CM) - Ataxia of right upper extremity  THERAPY DIAG:  Muscle weakness (generalized)  Unsteadiness on feet  Difficulty in walking, not elsewhere classified  Ataxic gait  RATIONALE FOR EVALUATION AND TREATMENT:  Rehabilitation  ONSET DATE: 08/09/2021 - TIA  NEXT MD VISIT: 08/22/21 with neurologist; 09/05/2021 with PCP   SUBJECTIVE:   SUBJECTIVE STATEMENT: Pt reports he fell backwards down a flight of stairs ~2 weeks ago while traveling. No fractures but bruised "from head to toe", esp his ribs. Hit his head at the time, but refused to go to ED - was checked out by his dtr who is a PA. Rib pain aggravated by lifting his suitcase in the overhead compartment on the plane ride home. Since the trip, he ended up in the ED x 2 due to feeling weak and noting ataxia in his R hand (reaching for glasses but unable to find them), with the  2nd trip leading to admission for a TIA. Still having R sided weakness and ataxia (he is R hand dominant) - notes difficulty with writing in particular. Acute care PT recommending OP PT for higher level balance. Used a cane initially due to the weakness but no longer. Recently feeling lightheaded and nausea with no appetite - reports feeling poorly on arrival to PT session. Wife states he was started on statin medication and thinks this may be contributing to the nausea as this is listed as a potential side effect. He notes he usually feels worse in the morning but gets better as the day progresses.  Pt accompanied by: significant other  PAIN:  Are you having pain? No  PERTINENT HISTORY:  Neurodegenerative dementia without  behavioral disturbance; memory loss; HTN; mild aortic stenosis; factor V Leiden; OA; back pain; HLD; h/o DVT and PE; recent fall (07/28/21) while on Eliquis - bruising to R shoulder and ribs  PRECAUTIONS: Fall  WEIGHT BEARING RESTRICTIONS: No  FALLS: Has patient fallen in last 6 months? Yes. Number of falls 1  LIVING ENVIRONMENT: Lives with: lives with their spouse Lives in: House/apartment Stairs: No Has following equipment at home: Single point cane, Environmental consultant - 4 wheeled, shower chair, and Grab bars  PLOF: Independent and Leisure: Reading, puzzles, building bird houses, walking daily x 3/4 mile  PATIENT GOALS: "To get back to the way I was."   OBJECTIVE:   VITAL SIGNS:  BP:  126/72 Pulse: 74 O2 sats: 93%  DIAGNOSTIC FINDINGS:  08/10/21- MRI brain: 1. No acute intracranial abnormality.  2. Findings of chronic ischemic microangiopathy and generalized volume loss. 08/09/21 - CT head: There is no acute intracranial hemorrhage or evidence of acute infarction. ASPECT score is 10. Stable chronic/nonemergent findings. 08/09/21 - CT angio head & neck: 1. Negative for intracranial large vessel occlusion or significant stenosis.  2. No significant carotid or vertebral artery stenosis in the neck. Mild atherosclerotic disease of the carotid bifurcation bilaterally.  COGNITION: Overall cognitive status: Impaired   SENSATION: WFL  COORDINATION: Heel to shin - impaired Finger to nose - WNL Finger- thumb opposition - WNL  POSTURE:  rounded shoulders, forward head, increased thoracic kyphosis, and flexed trunk    LOWER EXTREMITY MMT:   Tested in sitting  MMT Right Eval Left Eval  Hip flexion 4 4+  Hip extension 4+ 4+  Hip abduction 5 5  Hip adduction 4 4+  Hip internal rotation 4 4  Hip external rotation 4- 4  Knee flexion 5 5  Knee extension 5 5  Ankle dorsiflexion 4+ 5  Ankle plantarflexion    Ankle inversion    Ankle eversion    (Blank rows = not tested)  BED MOBILITY:   NT  TRANSFERS: Assistive device utilized: None  Sit to stand: Complete Independence Stand to sit: Modified independence Chair to chair: Modified independence Floor:  NT  GAIT: Distance walked: 150 Assistive device utilized: None Level of assistance: SBA Gait pattern: step through pattern and ataxic Comments: initially very unsteady reaching for walls and furniture but by end of visit gait pattern considerably better  STAIRS:  Level of Assistance: SBA  Stair Negotiation Technique: Alternating Pattern  with Single Rail on Right  Number of Stairs: 14   Height of Stairs: 7  Comments: more hesitant on descent with B LE ER  FUNCTIONAL TESTS:  5 times sit to stand: 19.59 sec; recurrent falls >15 sec Timed up and go (TUG): 12.00 sec 10 meter walk test: 10.94 sec;  Gait speed = 3.0 ft/sec Functional gait assessment: 17/30; < 19 = high risk fall   PATIENT SURVEYS:  ABC scale = 45% of self confidence, 55% impairment   TODAY'S TREATMENT:    08/17/2021 Eval only   PATIENT EDUCATION: Education details: PT eval findings, anticipated POC, and recommendation for OT referral to address UE ataxia as primary concerns seem to be fine motor (pt has appt with neurologist tomorrow) Person educated: Patient and Spouse Education method: Explanation and blank referral form provided to have MD complete for OT referral Education comprehension: verbalized understanding   HOME EXERCISE PROGRAM: TBD   ASSESSMENT:  CLINICAL IMPRESSION: Maximiano Lott is a 87 y.o. R hand dominant male who was seen today for physical therapy evaluation and treatment for ataxia of the R UE. He was recently admitted to the hospital for a diagnosis of a TIA and notes ongoing R-sided UE/LE weakness as well as ataxia of movement in UE and impaired balance - acute care PT recommending OP PT for higher level balance. He reports feeling poorly upon arrival to PT (lightheaded, nauseous, difficulty getting his  thoughts out and more unsteady with gait) but VS WNL - he states this has been typical of late and he usually feels better as the day progresses (improvement noted in cognition and gait just during the course of today's visit). In additional to above neurological issues, pt has experienced a recent fall down a flight of stairs during which he was "bruised all over" but no fractures (although not formally evaluated following the fall other than being assessed by his daughter who is a PA). He reports lingering R-sided rib pain. Current deficits include intermittent R-sided rib pain, R > L UE and LE weakness and ataxia, impaired fine motor in R UE; impaired coordination in R LE, impaired cognition, abnormal posture, impaired balance and gait instability. Ezri will benefit from skilled PT to address above LE strength and balance/coordination deficits to improve stability and decrease risk for future falls. Given fine motor deficits including pt's concern for ability to write with his R hand, he would likely benefit from OT to address UE deficits - encouraged patient and his wife to request OT orders at his visit with the neurologist tomorrow.   OBJECTIVE IMPAIRMENTS Abnormal gait, decreased activity tolerance, decreased balance, decreased cognition, decreased coordination, decreased endurance, decreased knowledge of condition, decreased knowledge of use of DME, decreased mobility, difficulty walking, decreased strength, decreased safety awareness, dizziness, impaired perceived functional ability, impaired UE functional use, improper body mechanics, postural dysfunction, and pain.   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   REHAB POTENTIAL: Good  CLINICAL DECISION MAKING: Evolving/moderate complexity  EVALUATION COMPLEXITY: Moderate   GOALS: Goals reviewed with patient? Yes  SHORT TERM GOALS: Target date: 09/18/2021   Patient will be independent with initial HEP. Baseline:  Goal status: INITIAL  2.  Patient will demonstrate decreased fall risk by scoring < 15 sec on 5xSTS. Baseline: 19.59 sec Goal status: INITIAL  3.  Patient will be educated on strategies to  decrease risk of falls.  Baseline:  Goal status: INITIAL   LONG TERM GOALS: Target date: 10/16/2021   Patient will be independent with advanced/ongoing HEP to improve outcomes and carryover.  Baseline:  Goal status: INITIAL  2.  Patient will be able to ambulate 600' with LRAD with good safety to access community.  Baseline:  Goal status: INITIAL  3.  Patient will be able to step up/down curb safely with LRAD for safety with community ambulation.  Baseline:  Goal status: INITIAL   4.  Patient will demonstrate improved B LE strength to >/= 4+/5 for improved stability and ease of mobility . Baseline: refer to above MMT chart Goal status: INITIAL  5.  Patient will demonstrate at least 25/30 on DGI to improve gait stability and reduce risk for falls. Baseline: 17/30 Goal status: INITIAL  6.  Patient will report 65% of self confidence (35% impairment) on ABC scale to demonstrate improved functional ability. Baseline: 45% of self confidence, 55% impairment Goal status: INITIAL   PLAN: PT FREQUENCY: 2x/week  PT DURATION: 8 weeks  PLANNED INTERVENTIONS: Therapeutic exercises, Therapeutic activity, Neuromuscular re-education, Balance training, Gait  training, Patient/Family education, Self Care, Joint mobilization, Stair training, DME instructions, Cryotherapy, Moist heat, Taping, Ultrasound, Ionotophoresis '4mg'$ /ml Dexamethasone, Manual therapy, and Re-evaluation  PLAN FOR NEXT SESSION: Create initial HEP for LE strengthening and balance   Percival Spanish, PT 08/21/2021, 12:36 PM

## 2021-08-19 LAB — VITAMIN B1: Vitamin B1 (Thiamine): 18 nmol/L (ref 8–30)

## 2021-08-21 ENCOUNTER — Other Ambulatory Visit: Payer: Self-pay

## 2021-08-21 ENCOUNTER — Encounter: Payer: Self-pay | Admitting: Physical Therapy

## 2021-08-21 ENCOUNTER — Telehealth: Payer: Self-pay | Admitting: Family Medicine

## 2021-08-21 ENCOUNTER — Ambulatory Visit: Payer: Medicare HMO | Attending: Family Medicine | Admitting: Physical Therapy

## 2021-08-21 DIAGNOSIS — R2681 Unsteadiness on feet: Secondary | ICD-10-CM | POA: Insufficient documentation

## 2021-08-21 DIAGNOSIS — R270 Ataxia, unspecified: Secondary | ICD-10-CM | POA: Diagnosis not present

## 2021-08-21 DIAGNOSIS — R262 Difficulty in walking, not elsewhere classified: Secondary | ICD-10-CM | POA: Diagnosis present

## 2021-08-21 DIAGNOSIS — R260 Ataxic gait: Secondary | ICD-10-CM | POA: Diagnosis present

## 2021-08-21 DIAGNOSIS — M6281 Muscle weakness (generalized): Secondary | ICD-10-CM | POA: Insufficient documentation

## 2021-08-21 NOTE — Telephone Encounter (Signed)
Pt's wife stated rosuvastatin (CRESTOR) 5 MG tablet is making him very nauseous and they would like to know what to do. Please advise.

## 2021-08-22 ENCOUNTER — Encounter: Payer: Self-pay | Admitting: Physician Assistant

## 2021-08-22 ENCOUNTER — Telehealth: Payer: Self-pay | Admitting: Family Medicine

## 2021-08-22 ENCOUNTER — Ambulatory Visit: Payer: Medicare HMO | Admitting: Physician Assistant

## 2021-08-22 ENCOUNTER — Telehealth: Payer: Self-pay | Admitting: Cardiology

## 2021-08-22 VITALS — BP 153/86 | HR 77 | Resp 18 | Ht 66.0 in | Wt 171.0 lb

## 2021-08-22 DIAGNOSIS — R270 Ataxia, unspecified: Secondary | ICD-10-CM

## 2021-08-22 DIAGNOSIS — R69 Illness, unspecified: Secondary | ICD-10-CM | POA: Diagnosis not present

## 2021-08-22 DIAGNOSIS — Z86718 Personal history of other venous thrombosis and embolism: Secondary | ICD-10-CM | POA: Diagnosis not present

## 2021-08-22 DIAGNOSIS — F0390 Unspecified dementia without behavioral disturbance: Secondary | ICD-10-CM | POA: Diagnosis not present

## 2021-08-22 DIAGNOSIS — G459 Transient cerebral ischemic attack, unspecified: Secondary | ICD-10-CM

## 2021-08-22 DIAGNOSIS — D682 Hereditary deficiency of other clotting factors: Secondary | ICD-10-CM | POA: Diagnosis not present

## 2021-08-22 NOTE — Telephone Encounter (Signed)
Patient went to the neurologist and they advised them to call cardiologist.  He cannot take pravastatin either so they will wait on call from cardiologist about what to do.  Also advised about the lipid clinic as well.

## 2021-08-22 NOTE — Telephone Encounter (Signed)
Pt c/o medication issue:  1. Name of Medication: rosuvastatin (CRESTOR) 5 MG tablet  2. How are you currently taking this medication (dosage and times per day)? As prescribed as of 2 days ago when pt stopped taking this medication  3. Are you having a reaction (difficulty breathing--STAT)? Yes  4. What is your medication issue?  Pt's wife is requesting call back in regards to this medication. She states that it was causing nausea and sleeping issues. She states that the patient stopped taking this medication 2 days ago and he seems better, but they need an alternative to this medication.

## 2021-08-22 NOTE — Patient Instructions (Addendum)
It was a pleasure to see you today at our office.   Recommendations:  Follow up in 6  months. Jan 18 at 11:30  Continue donepezil 10 mg daily. Side effects were discussed  Referral to hematology Recommend PT/OT as per family doctor  Recommend counseling   Whom to call:  Memory  decline, memory medications: Call our office [phone]   For psychiatric meds, mood meds: Please have your primary care physician manage these medications.   Counseling regarding caregiver distress, including caregiver depression, anxiety and issues regarding community resources, adult day care programs, adult living facilities, or memory care questions:   Feel free to contact [place], Social Worker at [phone]   For assessment of decision of mental capacity and competency:  Call Dr. Anthoney Harada, geriatric psychiatrist at [phone]  For guidance in geriatric dementia issues please call [facility] [phone]  For guidance regarding [facility] and if placement were needed at the facility, contact Arnell Asal, Social Worker tel: [phone]  Consider [place], [place] [phone]  Hours of Operation Mondays to Thursdays: 8 am to 8 pm,Fridays: 9 am to 8 pm, Saturdays: 9 am to 1 pm Sundays: Closed  https://[email or web address]/departments/parks-recreation/active-adults-[email or web address]/[facility]  If you have any severe symptoms of a stroke, or other severe issues such as confusion,severe chills or fever, etc call 911 or go to the ER as you may need to be evaluated further   Feel free to visit Facebook page " [employer]" for tips of how to care for people with memory problems.   Feel free to go to the following database for funded clinical studies conducted around the world: http://saunders.com/   https://www.triadclinicaltrials.com/     RECOMMENDATIONS FOR ALL PATIENTS WITH MEMORY  PROBLEMS: 1. Continue to exercise (Recommend 30 minutes of walking everyday, or 3 hours every week) 2. Increase social interactions - continue going to [place] and enjoy social gatherings with friends and family 3. Eat healthy, avoid fried foods and eat more fruits and vegetables 4. Maintain adequate blood pressure, blood sugar, and blood cholesterol level. Reducing the risk of stroke and cardiovascular disease also helps promoting better memory. 5. Avoid stressful situations. Live a simple life and avoid aggravations. Organize your time and prepare for the next day in anticipation. 6. Sleep well, avoid any interruptions of sleep and avoid any distractions in the bedroom that may interfere with adequate sleep quality 7. Avoid sugar, avoid sweets as there is a strong link between excessive sugar intake, diabetes, and cognitive impairment We discussed the Mediterranean diet, which has been shown to help patients reduce the risk of progressive memory disorders and reduces cardiovascular risk. This includes eating fish, eat fruits and green leafy vegetables, nuts like almonds and hazelnuts, walnuts, and also use olive oil. Avoid fast foods and fried foods as much as possible. Avoid sweets and sugar as sugar use has been linked to worsening of memory function.  There is always a concern of gradual progression of memory problems. If this is the case, then we may need to adjust level of care according to patient needs. Support, both to the patient and caregiver, should then be put into place.    The Alzheimer's Association is here all day, every day for people facing Alzheimer's disease through our free 24/7 Helpline: 519-765-3045. The Helpline provides reliable information and support to all those who need assistance, such as individuals living with memory loss, Alzheimer's or other dementia, caregivers, health care  professionals and the public.  Our highly trained and knowledgeable staff can help you  with: Understanding memory loss, dementia and Alzheimer's  Medications and other treatment options  General information about aging and brain health  Skills to provide quality care and to find the best care from professionals  Legal, financial and living-arrangement decisions Our Helpline also features: Confidential care consultation provided by master's level clinicians who can help with decision-making support, crisis assistance and education on issues families face every day  Help in a caller's preferred language using our translation service that features more than 200 languages and dialects  Referrals to local community programs, services and ongoing support     FALL PRECAUTIONS: Be cautious when walking. Scan the area for obstacles that may increase the risk of trips and falls. When getting up in the mornings, sit up at the edge of the bed for a few minutes before getting out of bed. Consider elevating the bed at the head end to avoid drop of blood pressure when getting up. Walk always in a well-lit room (use night lights in the walls). Avoid area rugs or power cords from appliances in the middle of the walkways. Use a walker or a cane if necessary and consider physical therapy for balance exercise. Get your eyesight checked regularly.  FINANCIAL OVERSIGHT: Supervision, especially oversight when making financial decisions or transactions is also recommended.  HOME SAFETY: Consider the safety of the kitchen when operating appliances like stoves, microwave oven, and blender. Consider having supervision and share cooking responsibilities until no longer able to participate in those. Accidents with firearms and other hazards in the house should be identified and addressed as well.   ABILITY TO BE LEFT ALONE: If patient is unable to contact 911 operator, consider using LifeLine, or when the need is there, arrange for someone to stay with patients. Smoking is a fire hazard, consider supervision  or cessation. Risk of wandering should be assessed by caregiver and if detected at any point, supervision and safe proof recommendations should be instituted.  MEDICATION SUPERVISION: Inability to self-administer medication needs to be constantly addressed. Implement a mechanism to ensure safe administration of the medications.   DRIVING: Regarding driving, in patients with progressive memory problems, driving will be impaired. We advise to have someone else do the driving if trouble finding directions or if minor accidents are reported. Independent driving assessment is available to determine safety of driving.   If you are interested in the driving assessment, you can contact the following:  [employer] in [place]  [employer] [phone] or [phone]      [place] refers to food and lifestyle choices that are based on the traditions of countries located on the [employer]. This way of eating has been shown to help prevent certain conditions and improve outcomes for people who have chronic diseases, like kidney disease and heart disease. What are tips for following this plan? Lifestyle  Cook and eat meals together with your family, when possible. Drink enough fluid to keep your urine clear or pale yellow. Be physically active every day. This includes: Aerobic exercise like running or swimming. Leisure activities like gardening, walking, or housework. Get 7-8 hours of sleep each night. If recommended by your health care provider, drink red wine in moderation. This means 1 glass a day for nonpregnant women and 2 glasses a day for men. A glass of wine  equals 5 oz (150 mL). Reading food labels  Check the serving size of packaged foods. For foods such as rice and pasta, the serving size refers to the amount of cooked product, not  dry. Check the total fat in packaged foods. Avoid foods that have saturated fat or trans fats. Check the ingredients list for added sugars, such as corn syrup. Shopping  At the grocery store, buy most of your food from the areas near the walls of the store. This includes: Fresh fruits and vegetables (produce). Grains, beans, nuts, and seeds. Some of these may be available in unpackaged forms or large amounts (in bulk). Fresh seafood. Poultry and eggs. Low-fat dairy products. Buy whole ingredients instead of prepackaged foods. Buy fresh fruits and vegetables in-season from local farmers markets. Buy frozen fruits and vegetables in resealable bags. If you do not have access to quality fresh seafood, buy precooked frozen shrimp or canned fish, such as tuna, salmon, or sardines. Buy small amounts of raw or cooked vegetables, salads, or olives from the deli or salad bar at your store. Stock your pantry so you always have certain foods on hand, such as olive oil, canned tuna, canned tomatoes, rice, pasta, and beans. Cooking  Cook foods with extra-virgin olive oil instead of using butter or other vegetable oils. Have meat as a side dish, and have vegetables or grains as your main dish. This means having meat in small portions or adding small amounts of meat to foods like pasta or stew. Use beans or vegetables instead of meat in common dishes like chili or lasagna. Experiment with different cooking methods. Try roasting or broiling vegetables instead of steaming or sauteing them. Add frozen vegetables to soups, stews, pasta, or rice. Add nuts or seeds for added healthy fat at each meal. You can add these to yogurt, salads, or vegetable dishes. Marinate fish or vegetables using olive oil, lemon juice, garlic, and fresh herbs. Meal planning  Plan to eat 1 vegetarian meal one day each week. Try to work up to 2 vegetarian meals, if possible. Eat seafood 2 or more times a week. Have healthy snacks  readily available, such as: Vegetable sticks with hummus. Greek yogurt. Fruit and nut trail mix. Eat balanced meals throughout the week. This includes: Fruit: 2-3 servings a day Vegetables: 4-5 servings a day Low-fat dairy: 2 servings a day Fish, poultry, or lean meat: 1 serving a day Beans and legumes: 2 or more servings a week Nuts and seeds: 1-2 servings a day Whole grains: 6-8 servings a day Extra-virgin olive oil: 3-4 servings a day Limit red meat and sweets to only a few servings a month What are my food choices? Mediterranean diet Recommended Grains: Whole-grain pasta. Brown rice. Bulgar wheat. Polenta. Couscous. Whole-wheat bread. [place]. Vegetables: Artichokes. Beets. Broccoli. Cabbage. Carrots. Eggplant. Green beans. Chard. Kale. Spinach. Onions. Leeks. Peas. Squash. Tomatoes. Peppers. Radishes. Fruits: Apples. Apricots. Avocado. Berries. Bananas. Cherries. Dates. Figs. Grapes. Lemons. Melon. Oranges. Peaches. Plums. Pomegranate. Meats and other protein foods: Beans. Almonds. Sunflower seeds. Pine nuts. Peanuts. [place]. Salmon. Scallops. Shrimp. [place]. Tilapia. Clams. Oysters. Eggs. Dairy: Low-fat milk. Cheese. Greek yogurt. Beverages: Water. Red wine. Herbal tea. Fats and oils: Extra virgin olive oil. Avocado oil. Grape seed oil. Sweets and desserts: [place] yogurt with honey. Baked apples. Poached pears. Trail mix. Seasoning and other foods: Basil. Cilantro. Coriander. Cumin. Mint. Parsley. Sage. Rosemary. Tarragon. Garlic. Oregano. Thyme. Pepper. Balsalmic vinegar. Tahini. Hummus. Tomato sauce. Olives. Mushrooms. Limit these Grains: Prepackaged pasta or  rice dishes. Prepackaged cereal with added sugar. Vegetables: Deep fried potatoes (french fries). Fruits: Fruit canned in syrup. Meats and other protein foods: Beef. Pork. Lamb. Poultry with skin. Hot dogs. [place] Salines. Dairy: Ice cream. Sour cream. Whole milk. Beverages: Juice. Sugar-sweetened soft drinks. Beer. Liquor and  spirits. Fats and oils: Butter. Canola oil. Vegetable oil. Beef fat (tallow). Lard. Sweets and desserts: Cookies. Cakes. Pies. Candy. Seasoning and other foods: Mayonnaise. Premade sauces and marinades. The items listed may not be a complete list. Talk with your dietitian about what dietary choices are right for you. Summary The Mediterranean diet includes both food and lifestyle choices. Eat a variety of fresh fruits and vegetables, beans, nuts, seeds, and whole grains. Limit the amount of red meat and sweets that you eat. Talk with your health care provider about whether it is safe for you to drink red wine in moderation. This means 1 glass a day for nonpregnant women and 2 glasses a day for men. A glass of wine equals 5 oz (150 mL). This information is not intended to replace advice given to you by your health care provider. Make sure you discuss any questions you have with your health care provider. Document Released: 09/15/2015 Document Revised: 10/18/2015 Document Reviewed: 09/15/2015 Elsevier Interactive Patient Education  2017 [employer].

## 2021-08-22 NOTE — Telephone Encounter (Signed)
Spoke with pt wife, aware will be okay for patient to remain off the crestor. She reports he did have trouble with pravastatin in the past as well. The patient was recently in the hospital again with a TIA and she reports that neuro said he needed to be on a statin. Aware dr Percival Spanish is out of the office but will forward for his review.

## 2021-08-22 NOTE — Telephone Encounter (Signed)
Patient wife notified that referral has been placed.

## 2021-08-22 NOTE — Progress Notes (Signed)
Assessment/Plan:   Dementia likely due to Alzheimer's Disease   Shawn [name] is a very pleasant 87 y.o. RH male with a history of hyperlipidemia, PE and DVT, Factor V Leiden deficiency on Eliquis, with Neuropsychological evaluation in January 2022 indicating mild dementia although at the time he did not clearly meet the full criteria, atypical Alzheimer's could be a possibility.  Vascular disease may be contributing but does not well explain his higher cortical signs.    He is on donepezil 10 mg daily.   He had a recent presentation to the ED doe TIA with negative neurological evaluation. 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 He is on Eliquis, Statin and needs to be on antihypertensive. He is at risk for embolic disease given his hematological history but has not been followed in several years.    Recommendations:    Continue donepezil 10 mg daily side effects were discussed Control vascular risk factors, recommend restarting statin. He denies a history of HTN but BP today is 153/83, needs Cards follow up   Referral to Hematology for FV Leiden and history of PE/DVT  Continue PT and OT for strength and balance Recommend CBT or behavioral therapy for depression, continue to control mood as per PCP  Follow up in 6  months.   Case discussed with Dr. Delice Lesch who agrees with the plan      Subjective:    This patient is accompanied in the office by his wife and daughter who supplements the history.  Previous records as well as any outside records available were reviewed prior to todays visit.Patient was last seen at our office on April 2023 at which time his MMSE was 25/30.  He is on donepezil 10 mg daily.   Any changes in memory since last visit?  He had a recent evaluation for R UE weakness and gait disturbance with neuro evaluation and negative workup. There was suspicion for TIA, and PT/OT was recommended. He feels that his memory  was affected STM< LTM.  His wife dc his rosuvastatin as she believed that had a role in his memory, disorientation, sleep and some nausea and has contacted her cardiologist to start a new statin.  Patient lives with: wife repeats oneself?  Endorsed Disoriented when walking into a room?  Patient denies. No confusion is reported   Leaving objects in unusual places?  Patient denies   Ambulates  with difficulty? He had a recent fall on 6/23 due to deconditioning and gait instability but without LOC or head injury seizures?   Patient denies   Wandering behavior?  Patient denies   Patient drives?   Patient no longer drives Any mood changes or depression? He endorses "not feeling well, my friends are dying, I can't drive, my health is not good". His wife is addressing this with PCP. He does not participate in senior activities and has less motivation than before    Hallucinations? At night he is more depressed, may possibly experience occasional sundowning  Paranoia?  Patient denies    Patient reports that he sleeps well without vivid dreams, REM behavior or sleepwalking   History of sleep apnea?  Patient denies   Any hygiene concerns?  Patient denies   Independent of bathing and dressing?  Endorsed  Does the patient needs help with medications?  Denies Who is in charge of the finances?   is in charge    Any changes in appetite?  Patient denies   Patient have  trouble swallowing? Patient denies   Does the patient cook?  Patient denies   Any kitchen accidents such as leaving the stove on? Patient denies   Any headaches?  Patient denies   Double vision? Patient denies   Any focal numbness or tingling?  Patient denies   Chronic back pain Patient denies   Unilateral weakness?  Patient denies   Any tremors?  Patient denies   Any history of anosmia?  Patient denies   Any incontinence of urine?  Patient denies   Any bowel dysfunction?   Patient denies         History on Initial Assessment  04/22/2020: This is an 87 year old right-handed man with a history of hyperlipidemia, PE and DVT, Factor V Leiden deficiency, presenting for evaluation of dementia. His wife is present to provide additional information. Records were reviewed, he underwent Neuropsychological evaluation in January 2022 with very significant cognitive impairment with profound visuospatial problems and additional low scores on measures of processing speed, executive function, and working memory. 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 Atypical Alzheimer's is also a possibility. Vascular disease may be contributing but does not well explain his higher cortical signs. MRI brain in 2017 showed moderate chronic microvascular disease and mild diffuse volume loss. Repeat MRI brain in 2022 showed progression of chronic microvascular disease, no acute changes.    He feels his memory is good, "not perfect." His wife started noticing forgetfulness and word-finding difficulties a couple of years ago. His handwriting has always been terrible, a little more illegible than before. He continues to drive and denies getting lost driving. He manages his own medications and denies missing doses. They do bills together. His wife denies any difficulties following instructions or using the remote control/microwave. He is independent with dressing and bathing. No personality changes, mood is "same as always," no paranoia or hallucinations.   They started noticing involuntary hand movements a few months ago. He has intermittent right arm myoclonus throughout the visit. They note he moves his fingers without realizing. Movements do not affect  writing or using utensils. He has not noticed them in his legs. No falls. Sleep is good, no REM behavior disorder. He does fall asleep when he reads during the day. No staring/unresponsive episodes, gaps in time, olfactory/gustatory hallucinations. No change in gait, he denies any stiffness/difficulty with getting out of bed.    Laboratory Data:      Lab Results  Component Value Date    TSH 1.73 01/11/2020         Lab Results  Component Value Date    VITAMINB12 >1526 (H) 01/11/2020    PREVIOUS MEDICATIONS:   CURRENT MEDICATIONS:  Outpatient Encounter Medications as of 08/22/2021  Medication Sig   Cholecalciferol 25 MCG (1000 UT) capsule Take 5,000 Units by mouth daily.   donepezil (ARICEPT) 10 MG tablet Take 1 tablet daily   ELIQUIS 5 MG TABS tablet TAKE 1 TABLET BY MOUTH TWICE A DAY (Patient taking differently: Take 5 mg by mouth 2 (two) times daily.)   Multiple Vitamin (MULTIVITAMIN) tablet Take 1 tablet by mouth daily.   Saw Palmetto 450-15 MG CAPS Take 1 capsule by mouth daily. (Patient taking differently: Take 2 capsules by mouth daily.)   vitamin B-12 (CYANOCOBALAMIN) 1000  MCG tablet Take 1 tablet by mouth daily.   rosuvastatin (CRESTOR) 5 MG tablet Take 1 tablet (5 mg total) by mouth daily. (Patient not taking: Reported on 08/22/2021)   No facility-administered encounter medications on file as of 08/22/2021.       05/24/2021   11:00 AM 12/22/2015   10:02 AM  MMSE - Mini Mental State Exam  Orientation to time 4 5  Orientation to Place 4 5  Registration 3 3  Attention/ Calculation 5 5  Recall 1 3  Language- name 2 objects 2 2  Language- repeat 1 1  Language- follow 3 step command 3 3  Language- read & follow direction 1 1  Write a sentence 1 1  Copy design 0 1  Total score 25 30      02/29/2020   11:00 AM  Montreal Cognitive Assessment   Visuospatial/ Executive (0/5) 1  Naming (0/3) 3  Attention: Read list of digits (0/2) 1  Attention: Read list of letters (0/1)  1  Attention: Serial 7 subtraction starting at 100 (0/3) 1  Language: Repeat phrase (0/2) 1  Language : Fluency (0/1) 0  Abstraction (0/2) 1  Delayed Recall (0/5) 0  Orientation (0/6) 4  Total 13  Adjusted Score (based on education) 14    Objective:     PHYSICAL EXAMINATION:    VITALS:   Vitals:   08/22/21 0905  BP: (!) 153/86  Pulse: 77  Resp: 18  SpO2: 95%  Weight: 171 lb (77.6 kg)  Height: '5\' 6"'$  (1.676 m)    GEN:  The patient appears stated age and is in NAD. HEENT:  Normocephalic, atraumatic.   Neurological examination:  General: NAD, well-groomed, appears stated age. Flat affect Orientation: The patient is alert. Oriented to person, place and date Cranial nerves: There is good facial symmetry.The speech is fluent and clear. No aphasia or dysarthria. Fund of knowledge is appropriate. Recent memory impaired and remote memory normal . Attention and concentration normal  Able to name objects and repeat phrases.  Hearing is intact to conversational tone.    Sensation: Sensation is intact to light touch throughout Motor: Strength is at least antigravity x4. Tremors: none  DTR's 2/4 in UE/LE     Movement examination: Tone: There is normal tone in the UE/LE Abnormal movements:  no tremor.  No myoclonus.  No asterixis.   Coordination:  There is no decremation with RAM's. Normal finger to nose  Gait and Station: The patient has no difficulty arising out of a deep-seated chair without the use of the hands. The patient's stride length is good.  Gait is cautious and narrow.    Thank you for allowing [place] the opportunity to participate in the care of this nice patient. Please do not hesitate to contact us for any questions or concerns.   Total time spent on today's visit was 45 minutes dedicated to this patient today, preparing to see patient, examining the patient, ordering tests and/or medications and counseling the patient, documenting clinical information in the EHR or  other health record, independently interpreting results and communicating results to the patient/family, discussing treatment and goals, answering patient's questions and coordinating care.  Cc:  Ann Held, DO  Clar[facility] 08/22/2021 7:59 PM

## 2021-08-22 NOTE — Telephone Encounter (Signed)
Pt's wife stated they went to physical therapy, and they recommend he would be better off going to occupational therapy. They would like it sent to [facility].   [facility] at [facility] [place], [place] [address]

## 2021-08-22 NOTE — Telephone Encounter (Signed)
No answer no vm

## 2021-08-23 ENCOUNTER — Ambulatory Visit: Payer: Medicare HMO | Admitting: Physical Therapy

## 2021-08-23 NOTE — Telephone Encounter (Signed)
Pt's wife stated cardiologist is out of town till the 31st and she is worried he will have a stroke if he does not take medication. She will not be out the house from 11:30-2 and is hoping if someone calls to not call during those hours.

## 2021-08-23 NOTE — Telephone Encounter (Signed)
Unable to reach pt or leave a message  

## 2021-08-25 ENCOUNTER — Inpatient Hospital Stay: Payer: Medicare HMO | Admitting: Family

## 2021-08-25 ENCOUNTER — Inpatient Hospital Stay: Payer: Medicare HMO | Attending: Hematology & Oncology

## 2021-08-25 ENCOUNTER — Encounter: Payer: Self-pay | Admitting: Family

## 2021-08-25 ENCOUNTER — Other Ambulatory Visit: Payer: Self-pay

## 2021-08-25 ENCOUNTER — Other Ambulatory Visit: Payer: Self-pay | Admitting: Family

## 2021-08-25 VITALS — BP 133/63 | HR 73 | Temp 98.1°F | Resp 18 | Ht 66.0 in | Wt 169.1 lb

## 2021-08-25 DIAGNOSIS — D6859 Other primary thrombophilia: Secondary | ICD-10-CM

## 2021-08-25 DIAGNOSIS — Z8261 Family history of arthritis: Secondary | ICD-10-CM

## 2021-08-25 DIAGNOSIS — D6851 Activated protein C resistance: Secondary | ICD-10-CM | POA: Diagnosis not present

## 2021-08-25 DIAGNOSIS — F0390 Unspecified dementia without behavioral disturbance: Secondary | ICD-10-CM

## 2021-08-25 DIAGNOSIS — R350 Frequency of micturition: Secondary | ICD-10-CM

## 2021-08-25 DIAGNOSIS — Z86711 Personal history of pulmonary embolism: Secondary | ICD-10-CM | POA: Diagnosis not present

## 2021-08-25 DIAGNOSIS — I2782 Chronic pulmonary embolism: Secondary | ICD-10-CM

## 2021-08-25 DIAGNOSIS — Z8249 Family history of ischemic heart disease and other diseases of the circulatory system: Secondary | ICD-10-CM | POA: Diagnosis not present

## 2021-08-25 DIAGNOSIS — Z86718 Personal history of other venous thrombosis and embolism: Secondary | ICD-10-CM | POA: Diagnosis not present

## 2021-08-25 DIAGNOSIS — Z9049 Acquired absence of other specified parts of digestive tract: Secondary | ICD-10-CM | POA: Diagnosis not present

## 2021-08-25 DIAGNOSIS — Z79899 Other long term (current) drug therapy: Secondary | ICD-10-CM | POA: Insufficient documentation

## 2021-08-25 DIAGNOSIS — Z7901 Long term (current) use of anticoagulants: Secondary | ICD-10-CM

## 2021-08-25 DIAGNOSIS — G459 Transient cerebral ischemic attack, unspecified: Secondary | ICD-10-CM | POA: Insufficient documentation

## 2021-08-25 DIAGNOSIS — I82401 Acute embolism and thrombosis of unspecified deep veins of right lower extremity: Secondary | ICD-10-CM

## 2021-08-25 DIAGNOSIS — K5900 Constipation, unspecified: Secondary | ICD-10-CM | POA: Insufficient documentation

## 2021-08-25 DIAGNOSIS — Z8719 Personal history of other diseases of the digestive system: Secondary | ICD-10-CM

## 2021-08-25 DIAGNOSIS — R531 Weakness: Secondary | ICD-10-CM | POA: Insufficient documentation

## 2021-08-25 DIAGNOSIS — R69 Illness, unspecified: Secondary | ICD-10-CM | POA: Diagnosis not present

## 2021-08-25 DIAGNOSIS — Z801 Family history of malignant neoplasm of trachea, bronchus and lung: Secondary | ICD-10-CM | POA: Diagnosis not present

## 2021-08-25 DIAGNOSIS — D682 Hereditary deficiency of other clotting factors: Secondary | ICD-10-CM

## 2021-08-25 LAB — CBC WITH DIFFERENTIAL (CANCER CENTER ONLY)
Abs Immature Granulocytes: 0.01 10*3/uL (ref 0.00–0.07)
Basophils Absolute: 0 10*3/uL (ref 0.0–0.1)
Basophils Relative: 0 %
Eosinophils Absolute: 0.1 10*3/uL (ref 0.0–0.5)
Eosinophils Relative: 2 %
HCT: 47.3 % (ref 39.0–52.0)
Hemoglobin: 16.7 g/dL (ref 13.0–17.0)
Immature Granulocytes: 0 %
Lymphocytes Relative: 33 %
Lymphs Abs: 1.6 10*3/uL (ref 0.7–4.0)
MCH: 34.5 pg — ABNORMAL HIGH (ref 26.0–34.0)
MCHC: 35.3 g/dL (ref 30.0–36.0)
MCV: 97.7 fL (ref 80.0–100.0)
Monocytes Absolute: 0.5 10*3/uL (ref 0.1–1.0)
Monocytes Relative: 9 %
Neutro Abs: 2.7 10*3/uL (ref 1.7–7.7)
Neutrophils Relative %: 56 %
Platelet Count: 150 10*3/uL (ref 150–400)
RBC: 4.84 MIL/uL (ref 4.22–5.81)
RDW: 12 % (ref 11.5–15.5)
WBC Count: 4.9 10*3/uL (ref 4.0–10.5)
nRBC: 0 % (ref 0.0–0.2)

## 2021-08-25 LAB — CMP (CANCER CENTER ONLY)
ALT: 18 U/L (ref 0–44)
AST: 20 U/L (ref 15–41)
Albumin: 4.7 g/dL (ref 3.5–5.0)
Alkaline Phosphatase: 65 U/L (ref 38–126)
Anion gap: 5 (ref 5–15)
BUN: 21 mg/dL (ref 8–23)
CO2: 33 mmol/L — ABNORMAL HIGH (ref 22–32)
Calcium: 10.5 mg/dL — ABNORMAL HIGH (ref 8.9–10.3)
Chloride: 101 mmol/L (ref 98–111)
Creatinine: 1.14 mg/dL (ref 0.61–1.24)
GFR, Estimated: >60 mL/min (ref >60)
Glucose, Bld: 117 mg/dL — ABNORMAL HIGH (ref 70–99)
Potassium: 5.5 mmol/L — ABNORMAL HIGH (ref 3.5–5.1)
Sodium: 139 mmol/L (ref 135–145)
Total Bilirubin: 1.2 mg/dL (ref 0.3–1.2)
Total Protein: 7.4 g/dL (ref 6.5–8.1)

## 2021-08-25 LAB — LACTATE DEHYDROGENASE: LDH: 177 U/L (ref 98–192)

## 2021-08-25 LAB — ANTITHROMBIN III: AntiThromb III Func: 106 % (ref 75–120)

## 2021-08-25 MED ORDER — ATORVASTATIN CALCIUM 10 MG PO TABS: 10.0000 mg | ORAL_TABLET | Freq: Every day | ORAL | 0 refills | Status: DC

## 2021-08-25 NOTE — Progress Notes (Signed)
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  He transitioned from Coumadin last year to Eliquis.  He was diagnosed years ago with Factor V Leiden.  He was recently hospitalized with suspected embolic posterior circulation TIA and states that 2 weeks prior to this episodes he had fallen down 2 flights of stairs and hit his head and bruised his ribs. MRI showed no acute stroke. He was told to continue his Eliquis and they also added Crestor but he does not like how this has made him feel (disoriented) and is switching now to Lipitor.  ECHO showed EF 65-70%.  He remains symptomatic with fatigue, lightheadedness, right sided weakness in the arm and leg and states that he has lost 6 lbs since his hospitalization.  No smoking, ETOH or recreational drug use.  No testosterone use,  No history of diabetes or thyroid disease.  He states that he had veins stripped from one of his legs many years ago but cannot remember which leg.  No fever, chills, n/v, cough, rash, SOB, chest pain, palpitations or abdominal pain.  He has intermittent episodes of constipation.  Her notes frequent urination at night.  Appetite comes and goes and he admits that he needs to better hydrate throughout the day.  He was in the [employer] for 6 years and stationed at  [place] [employer].   ROS: All other 10 point review of systems is negative.   PAST MEDICAL HISTORY:   Past Medical History:  Diagnosis Date   Allergy    Cardiac murmur    mild aortic sclerosis   Cataract    left eye   Clotting disorder ([place])    Factor v Leiden factor mutation   Colon polyps    Hyperplastic   Diverticulosis    Dysphagia    Factor V Leiden mutation ([place])    Hemorrhoids    History of blood clots    Hyperlipidemia    Osteoarthritis     ALLERGIES: Allergies  Allergen Reactions   Crestor [Rosuvastatin] Other (See Comments)    disorientation   Niacin Rash    "My whole body turned red on the prescription dose" He takes the OTC medication   Pravastatin Other (See Comments)    Disorientation      MEDICATIONS:  Current Outpatient Medications on File Prior to Visit  Medication Sig Dispense Refill   atorvastatin (LIPITOR) 10 MG tablet Take 1 tablet (10 mg total) by mouth daily. 30 tablet 0   Cholecalciferol 25 MCG (1000 UT) capsule Take 5,000 Units by mouth daily.     donepezil (ARICEPT) 10 MG tablet Take 1 tablet daily 180 tablet 1   ELIQUIS 5 MG TABS tablet TAKE 1 TABLET BY MOUTH TWICE A DAY (Patient taking differently: Take 5 mg by mouth 2 (two) times daily.) 180 tablet 1  Multiple Vitamin (MULTIVITAMIN) tablet Take 1 tablet by mouth daily.     Saw Palmetto 450-15 MG CAPS Take 1 capsule by mouth daily. (Patient taking differently: Take 2 capsules by mouth daily.)     vitamin B-12 (CYANOCOBALAMIN) 1000 MCG tablet Take 1 tablet by mouth daily.     No current facility-administered medications on file prior to visit.     PAST SURGICAL HISTORY Past Surgical History:  Procedure Laterality Date   APPENDECTOMY     CATARACT EXTRACTION  02/09/2007   right eye   INGUINAL HERNIA REPAIR     TONSILLECTOMY     VARICOSE VEIN SURGERY     VASECTOMY      FAMILY HISTORY: Family History  Problem Relation Age of Onset   Lung cancer Father    Heart attack  Mother 93   Heart disease Mother        chf   Arthritis Mother    Colon cancer Neg Hx    Esophageal cancer Neg Hx    Rectal cancer Neg Hx    Stomach cancer Neg Hx     SOCIAL HISTORY:  reports that he has never smoked. He has never used smokeless tobacco. He reports that he does not drink alcohol and does not use drugs.  PERFORMANCE STATUS: The patient's performance status is 1 - Symptomatic but completely ambulatory  PHYSICAL EXAM: Most Recent Vital Signs: Blood pressure 133/63, pulse 73, temperature 98.1 F (36.7 C), temperature source Oral, resp. rate 18, height '5\' 6"'$  (1.676 m), weight 169 lb 1.9 oz (76.7 kg), SpO2 96 %. BP 133/63 (BP Location: Left Arm, Patient Position: Sitting)   Pulse 73   Temp 98.1 F (36.7 C) (Oral)   Resp 18   Ht '5\' 6"'$  (1.676 m)   Wt 169 lb 1.9 oz (76.7 kg)   SpO2 96%   BMI 27.30 kg/m   General Appearance:    Alert, cooperative, no distress, appears stated age  Head:    Normocephalic, without obvious abnormality, atraumatic  Eyes:    PERRL, conjunctiva/corneas clear, EOM's intact, fundi    benign, both eyes             Throat:   Lips, mucosa, and tongue normal; teeth and gums normal  Neck:   Supple, symmetrical, trachea midline, no adenopathy;       thyroid:  No enlargement/tenderness/nodules; no carotid   bruit or JVD  Back:     Symmetric, no curvature, ROM normal, no CVA tenderness  Lungs:     Clear to auscultation bilaterally, respirations unlabored  Chest wall:    No tenderness or deformity  Heart:    Regular rate and rhythm, S1 and S2 normal, no murmur, rub   or gallop  Abdomen:     Soft, non-tender, bowel sounds active all four quadrants,    no masses, no organomegaly        Extremities:   Extremities normal, atraumatic, no cyanosis or edema  Pulses:   2+ and symmetric all extremities  Skin:   Skin color, texture, turgor normal, no rashes or lesions  Lymph nodes:   Cervical, supraclavicular, and axillary nodes normal  Neurologic:    CNII-XII intact. Normal strength, sensation and reflexes      throughout    LABORATORY DATA:  Results for orders placed or performed in visit on 08/25/21 (from the past 48 hour(s))  CBC with Differential ([place] Only)     Status: Abnormal   Collection Time: 08/25/21  2:33 PM  Result Value Ref Range   WBC Count 4.9 4.0 - 10.5 K/uL   RBC 4.84 4.22 - 5.81 MIL/uL   Hemoglobin 16.7 13.0 - 17.0 g/dL   HCT 47.3 39.0 - 52.0 %   MCV 97.7 80.0 - 100.0 fL   MCH 34.5 (H) 26.0 - 34.0 pg   MCHC 35.3 30.0 - 36.0 g/dL   RDW 12.0 11.5 - 15.5 %   Platelet Count 150 150 - 400 K/uL   nRBC 0.0 0.0 - 0.2 %   Neutrophils Relative % 56 %   Neutro Abs 2.7 1.7 - 7.7 K/uL   Lymphocytes Relative 33 %   Lymphs Abs 1.6 0.7 - 4.0 K/uL   Monocytes Relative 9 %   Monocytes Absolute 0.5 0.1 - 1.0 K/uL   Eosinophils Relative 2 %   Eosinophils Absolute 0.1 0.0 - 0.5 K/uL   Basophils Relative 0 %   Basophils Absolute 0.0 0.0 - 0.1 K/uL   Immature Granulocytes 0 %   Abs Immature Granulocytes 0.01 0.00 - 0.07 K/uL    Comment: Performed at [facility] at [facility], [address], [place], [place] [address]      RADIOGRAPHY: No results found.     PATHOLOGY: None  ASSESSMENT/PLAN: Shawn [name] is a very pleasant 87 yo gentleman with history of Factor V Leiden and DVT in the right lower extremity as well as PE diagnosed in 2011 and now most recently a suspected embolic TIA currently on Eliquis and Lipitor.  Hypercoag pending to determine if patient has any other underlying coagulation issue.  Follow-up in 3 months.   All questions were answered. The patient knows to call the clinic with any problems, questions or concerns. We can certainly see the patient much sooner if necessary.  The patient was discussed with Dr. Marin Olp and he is in agreement with the aforementioned.   Lottie Dawson,  NP

## 2021-08-25 NOTE — Telephone Encounter (Signed)
Spoke with patients wife advised her that we can do one thing right away which is he try the new statin.  I also advised that patient would be ok if they would like to wait til the cardiologist get back because they did not think it was urgent.  She stated they will try the other statin and will call if they have any issues and stop immediately.

## 2021-08-26 LAB — CARDIOLIPIN ANTIBODIES, IGG, IGM, IGA
Anticardiolipin IgA: <9 APL U/mL (ref 0–11)
Anticardiolipin IgG: <9 GPL U/mL (ref 0–14)
Anticardiolipin IgM: <9 MPL U/mL (ref 0–12)

## 2021-08-27 LAB — BETA-2-GLYCOPROTEIN I ABS, IGG/M/A
Beta-2 Glyco I IgG: <9 GPI IgG units (ref 0–20)
Beta-2-Glycoprotein I IgA: <9 GPI IgA units (ref 0–25)
Beta-2-Glycoprotein I IgM: <9 GPI IgM units (ref 0–32)

## 2021-08-27 LAB — PROTEIN C, TOTAL: Protein C, Total: 90 % (ref 60–150)

## 2021-08-28 LAB — DRVVT CONFIRM: dRVVT Confirm: 1 ratio (ref 0.8–1.2)

## 2021-08-28 LAB — LUPUS ANTICOAGULANT PANEL
DRVVT: 55.9 s — ABNORMAL HIGH (ref 0.0–47.0)
PTT Lupus Anticoagulant: 39.3 s (ref 0.0–43.5)

## 2021-08-28 LAB — PROTEIN S, TOTAL: Protein S Ag, Total: 120 % (ref 60–150)

## 2021-08-28 LAB — HOMOCYSTEINE: Homocysteine: 11.4 umol/L (ref 0.0–21.3)

## 2021-08-28 LAB — PROTEIN C ACTIVITY: Protein C Activity: 95 % (ref 73–180)

## 2021-08-28 LAB — DRVVT MIX: dRVVT Mix: 45.6 s — ABNORMAL HIGH (ref 0.0–40.4)

## 2021-08-28 LAB — PROTEIN S ACTIVITY: Protein S Activity: 107 % (ref 63–140)

## 2021-08-31 LAB — FACTOR 5 LEIDEN

## 2021-09-01 ENCOUNTER — Encounter: Payer: Medicare HMO | Admitting: Physical Therapy

## 2021-09-01 LAB — PROTHROMBIN GENE MUTATION

## 2021-09-01 NOTE — Telephone Encounter (Signed)
Spoke with pt wife, she reports the patient is now on atorvastatin and seems to be tolerating it fine right now.

## 2021-09-04 ENCOUNTER — Telehealth: Payer: Self-pay

## 2021-09-04 ENCOUNTER — Encounter: Payer: Medicare HMO | Admitting: Physical Therapy

## 2021-09-04 ENCOUNTER — Ambulatory Visit: Payer: Medicare HMO

## 2021-09-04 DIAGNOSIS — M6281 Muscle weakness (generalized): Secondary | ICD-10-CM | POA: Diagnosis not present

## 2021-09-04 DIAGNOSIS — R260 Ataxic gait: Secondary | ICD-10-CM | POA: Diagnosis not present

## 2021-09-04 DIAGNOSIS — R270 Ataxia, unspecified: Secondary | ICD-10-CM | POA: Diagnosis not present

## 2021-09-04 DIAGNOSIS — R262 Difficulty in walking, not elsewhere classified: Secondary | ICD-10-CM | POA: Diagnosis not present

## 2021-09-04 DIAGNOSIS — R2681 Unsteadiness on feet: Secondary | ICD-10-CM

## 2021-09-04 NOTE — Telephone Encounter (Signed)
Attempted to call patient but no answer and unable to leave vm.

## 2021-09-04 NOTE — Telephone Encounter (Signed)
-----   Message from Celso Amy, NP sent at 09/04/2021  2:04 PM EDT ----- Nothing new or exciting found on his hyper coag panel! WOO HOO!!! Factor V Leiden only!!! No changes to Eliquis regimen.    ----- Message ----- From: Buel Ream, Lab [facility] [place] Sent: 08/26/2021   6:37 PM EDT To: Celso Amy, NP

## 2021-09-04 NOTE — Therapy (Signed)
OUTPATIENT PHYSICAL THERAPY NEURO TREATMENT   Patient Name: Shawn [name] MRN: [id number] DOB:[birth year], 87 y.o., male Today's Date: 09/04/2021      PT End of Session - 09/04/21 1425     Visit Number 2    Date for PT Re-Evaluation 10/16/21    Authorization Type [employer]    Progress Note Due on Visit 10    PT Start Time 1428    PT Stop Time 1511    PT Time Calculation (min) 43 min    Equipment Utilized During Treatment Gait belt    Activity Tolerance Patient tolerated treatment well    Behavior During Therapy WFL for tasks assessed/performed              Past Medical History:  Diagnosis Date   Allergy    Cardiac murmur    mild aortic sclerosis   Cataract    left eye   Clotting disorder ([place])    Factor v Leiden factor mutation   Colon polyps    Hyperplastic   Diverticulosis    Dysphagia    Factor V Leiden mutation ([place])    Hemorrhoids    History of blood clots    Hyperlipidemia    Osteoarthritis    Past Surgical History:  Procedure Laterality Date   APPENDECTOMY     CATARACT EXTRACTION  02/09/2007   right eye   [place]     Patient Active Problem List   Diagnosis Date Noted   Ataxia of right upper extremity 08/09/2021   Otitis externa 07/07/2021   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 ([place]) 03/18/2020   High serum vitamin D 01/12/2020   Pain of left thumb 02/20/2016   Preventative health care 11/13/2013   History of colon polyps 11/02/2011   Bowel habit changes 11/02/2011   Other and unspecified coagulation defects 11/02/2011   Overweight(278.02) 10/05/2010   BACK PAIN 04/21/2010   COLONIC POLYPS, ADENOMATOUS, HX OF 03/22/2010   Factor V deficiency ([place]) 12/01/2009   THROMBOCYTOPENIA 12/01/2009   PULMONARY EMBOLISM, HX OF 12/01/2009   Pulmonary embolism ([place]) 11/20/2009   CERUMEN IMPACTION, RIGHT 07/29/2009   VARICOSE VEINS LOWER  EXTREMITIES W/INFLAMMATION 12/01/2008   Acute thromboembolism of deep veins of lower extremity ([place]) 09/03/2008   Aortic valve disorder 11/19/2007   HEPATIC CYST 11/19/2007   CALF PAIN, LEFT 11/11/2007   MEMORY LOSS 08/28/2007   MOLE 08/11/2007   Hyperlipidemia 09/04/2006   OSTEOARTHRITIS 09/04/2006   DYSPHAGIA, UNSPECIFIED 09/04/2006    PCP: Ann Held, DO  REFERRING PROVIDER: Patrecia Pour, MD  REFERRING DIAG: R27.0 (ICD-10-CM) - Ataxia of right upper extremity  THERAPY DIAG:  Muscle weakness (generalized)  Unsteadiness on feet  Difficulty in walking, not elsewhere classified  Ataxic gait  RATIONALE FOR EVALUATION AND TREATMENT:  Rehabilitation  ONSET DATE: 08/09/2021 - TIA  NEXT MD VISIT: 08/22/21 with neurologist; 09/05/2021 with PCP   SUBJECTIVE:   SUBJECTIVE STATEMENT: "Could be better" No falls since the evaluation and is not having pain.   Pt accompanied by: significant other  PAIN:  Are you having pain? 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 Yes. Number of falls 1  LIVING ENVIRONMENT: Lives with: lives with their spouse Lives in: House/apartment Stairs: No Has following equipment at home: Single point cane, Environmental consultant - 4 wheeled, shower chair, and Grab bars  PLOF: Independent and Leisure: Reading, puzzles, building bird houses, walking daily x 3/4 mile  PATIENT GOALS: "To get back to the way I was."   OBJECTIVE:   VITAL SIGNS:  BP:  126/72 Pulse: 74 O2 sats: 93%  DIAGNOSTIC FINDINGS:  08/10/21- MRI brain: 1. No acute intracranial abnormality.  2. Findings of chronic ischemic microangiopathy and generalized volume loss. 08/09/21 - CT  head: There is no acute intracranial hemorrhage or evidence of acute infarction. ASPECT score is 10. Stable chronic/nonemergent findings. 08/09/21 - CT angio head & neck: 1. Negative for intracranial large vessel occlusion or significant stenosis.  2. No significant carotid or vertebral artery stenosis in the neck. Mild atherosclerotic disease of the carotid bifurcation bilaterally.  COGNITION: Overall cognitive status: Impaired   SENSATION: WFL  COORDINATION: Heel to shin - impaired Finger to nose - WNL Finger- thumb opposition - WNL  POSTURE:  rounded shoulders, forward head, increased thoracic kyphosis, and flexed trunk    LOWER EXTREMITY MMT:   Tested in sitting  MMT Right Eval Left Eval  Hip flexion 4 4+  Hip extension 4+ 4+  Hip abduction 5 5  Hip adduction 4 4+  Hip internal rotation 4 4  Hip external rotation 4- 4  Knee flexion 5 5  Knee extension 5 5  Ankle dorsiflexion 4+ 5  Ankle plantarflexion    Ankle inversion    Ankle eversion    (Blank rows = not tested)  BED MOBILITY:  NT  TRANSFERS: Assistive device utilized: None  Sit to stand: Complete Independence Stand to sit: Modified independence Chair to chair: Modified independence Floor:  NT  GAIT: Distance walked: 150 Assistive device utilized: None Level of assistance: SBA Gait pattern: step through pattern and ataxic Comments: initially very unsteady reaching for walls and furniture but by end of visit gait pattern considerably better  STAIRS:  Level of Assistance: SBA  Stair Negotiation Technique: Alternating Pattern  with Single Rail on Right  Number of Stairs: 14   Height of Stairs: 7  Comments: more hesitant on descent with B LE ER  FUNCTIONAL TESTS:  5 times sit to stand: 19.59 sec; recurrent falls >15 sec Timed up and go (TUG): 12.00 sec 10 meter walk test: 10.94 sec; Gait speed = 3.0 ft/sec Functional gait assessment: 17/30; < 19 = high risk fall   PATIENT SURVEYS:  ABC scale =  45% of self confidence, 55% impairment   TODAY'S TREATMENT:  09/04/21 Bike L4 x21mns STS 2x10 Cybex ext 10# 2x10 Cybex flexion 20# 2x10 Step ups 6" x10 each side Step up on Airex x10 each side Walk on beam in // bars  2 way hip 2# 2x10 BLE   08/17/2021 Eval only   PATIENT EDUCATION: Education details: PT eval findings, anticipated POC, and recommendation for OT referral to address UE ataxia as primary concerns seem to be fine motor (pt has appt with neurologist tomorrow) Person educated: Patient and Spouse Education method: Explanation and blank referral form provided to have MD complete for OT referral Education comprehension: verbalized understanding   HOME EXERCISE PROGRAM: TBD   ASSESSMENT:  CLINICAL IMPRESSION: Patient enters session feeling well overall and states he is getting better everyday. We focused on LE strengthening and balance activities today. Most  difficulty today with step ups on airex especially backwards stepping off on the RLE. Patient expresses his R side is weaker than the left. He tolerates session well and will continue to benefit from additional strength and balance training.     OBJECTIVE IMPAIRMENTS Abnormal gait, decreased activity tolerance, decreased balance, decreased cognition, decreased coordination, decreased endurance, decreased knowledge of condition, decreased knowledge of use of DME, decreased mobility, difficulty walking, decreased strength, decreased safety awareness, dizziness, impaired perceived functional ability, impaired UE functional use, improper body mechanics, postural dysfunction, and pain.   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   REHAB POTENTIAL: Good  CLINICAL DECISION MAKING: Evolving/moderate complexity  EVALUATION COMPLEXITY: Moderate   GOALS: Goals reviewed with patient? Yes  SHORT TERM GOALS: Target date: 09/18/2021   Patient will be independent with initial HEP. Baseline:  Goal status: INITIAL  2.  Patient will demonstrate decreased fall risk by scoring < 15 sec on 5xSTS. Baseline: 19.59 sec Goal status: INITIAL  3.  Patient will be educated on strategies to decrease risk of falls.  Baseline:  Goal status: INITIAL   LONG TERM GOALS: Target date: 10/16/2021   Patient will be independent with advanced/ongoing HEP to improve outcomes and carryover.  Baseline:  Goal status: INITIAL  2.  Patient will be able to ambulate 600' with LRAD with good safety to access community.  Baseline:  Goal status: INITIAL  3.  Patient will be able to step up/down curb safely with LRAD for safety with community ambulation.  Baseline:  Goal status: INITIAL   4.  Patient will demonstrate improved B LE strength to >/= 4+/5 for improved stability and ease of mobility . Baseline: refer to above MMT chart Goal status: INITIAL  5.  Patient will demonstrate at least 25/30 on DGI to improve gait stability and reduce risk for falls. Baseline: 17/30 Goal status: INITIAL  6.  Patient will report 65% of self confidence (35% impairment) on ABC scale to demonstrate improved functional ability. Baseline: 45% of self confidence, 55% impairment Goal status: INITIAL   PLAN: PT FREQUENCY: 2x/week  PT DURATION: 8 weeks  PLANNED INTERVENTIONS: Therapeutic exercises, Therapeutic activity, Neuromuscular re-education, Balance training, Gait  training, Patient/Family education, Self Care, Joint mobilization, Stair training, DME instructions, Cryotherapy, Moist heat, Taping, Ultrasound, Ionotophoresis '4mg'$ /ml Dexamethasone, Manual therapy, and Re-evaluation  PLAN FOR NEXT SESSION: Create initial HEP for LE strengthening and balance   Andris Baumann, PT 09/04/2021, 3:12 PM

## 2021-09-05 ENCOUNTER — Telehealth: Payer: Self-pay | Admitting: Cardiology

## 2021-09-05 ENCOUNTER — Encounter: Payer: Self-pay | Admitting: Family Medicine

## 2021-09-05 ENCOUNTER — Ambulatory Visit (INDEPENDENT_AMBULATORY_CARE_PROVIDER_SITE_OTHER): Payer: Medicare HMO | Admitting: Family Medicine

## 2021-09-05 ENCOUNTER — Telehealth: Payer: Self-pay

## 2021-09-05 VITALS — BP 120/60 | HR 64 | Temp 97.7°F | Resp 18 | Ht 66.0 in | Wt 169.4 lb

## 2021-09-05 DIAGNOSIS — Z79899 Other long term (current) drug therapy: Secondary | ICD-10-CM

## 2021-09-05 DIAGNOSIS — E785 Hyperlipidemia, unspecified: Secondary | ICD-10-CM | POA: Diagnosis not present

## 2021-09-05 DIAGNOSIS — R270 Ataxia, unspecified: Secondary | ICD-10-CM

## 2021-09-05 MED ORDER — EZETIMIBE 10 MG PO TABS: 10.0000 mg | ORAL_TABLET | Freq: Every day | ORAL | 3 refills | Status: DC

## 2021-09-05 NOTE — Telephone Encounter (Signed)
Called and informed patient's wife (ok per DPR) of lab results, wife verbalized understanding and denies any questions or concerns at this time.

## 2021-09-05 NOTE — Assessment & Plan Note (Signed)
Pt not able to tol statins---- recently stopped lipitor due to brain fog , myalgias Start zetia  Contact cardiology Check labs in 6 months

## 2021-09-05 NOTE — Telephone Encounter (Signed)
Wife wanted Dr. Percival Spanish to know that patient became disoriented at night and had nausea and tiredness from atorvastatin. Patient's pcp stopped atorvastatin one week ago and started patient on zetia '10mg'$  that he will begin to take tomorrow.

## 2021-09-05 NOTE — Telephone Encounter (Signed)
-----   Message from Celso Amy, NP sent at 09/04/2021  2:04 PM EDT ----- Nothing new or exciting found on his hyper coag panel! WOO HOO!!! Factor V Leiden only!!! No changes to Eliquis regimen.    ----- Message ----- From: Buel Ream, Lab In [place] Sent: 08/26/2021   6:37 PM EDT To: Celso Amy, NP

## 2021-09-05 NOTE — Progress Notes (Addendum)
Subjective:   By signing my name below, I, Shawn [name], attest that this documentation has been prepared under the direction and in the presence of Ann Held, DO 09/05/2021   Patient ID: Shawn [name], male    DOB: Jul 04, 1934, 87 y.o.   MRN: [id number]  Chief Complaint  Patient presents with   Fatigue   Follow-up    HPI Patient is in today for follow up visit.  He is accompanied with his wife. Of note, he had a fall prior to the visit down the stairs in his house before going to the hospital.   He stopped taking a rosuvastatin and atorvastatin he was prescribed in the hospital due to headaches and discomfort. His wife adds that he was depressed and nauseous after using the statin medication as well.  His wife states that he has lost his appetite since the hospital visit. She has been managing his appetite loss by giving him Ensure. He adds that he enjoys eating chocolate and ice cream.   He started physical therapy which has improved his symptoms.   His wife reports that he occasionally has abdominal spasms, but he denies having any pain in his abdomen. She also says that he has been tired and fatigued recently.  He is compliant with Fish oil, Vitamin D and B12 supplements.   Past Medical History:  Diagnosis Date   Allergy    Cardiac murmur    mild aortic sclerosis   Cataract    left eye   Clotting disorder (HCC)    Factor v Leiden factor mutation   Colon polyps    Hyperplastic   Diverticulosis    Dysphagia    Factor V Leiden mutation ([place])    Hemorrhoids    History of blood clots    Hyperlipidemia    Osteoarthritis     Past Surgical History:  Procedure Laterality Date   APPENDECTOMY     CATARACT EXTRACTION  02/09/2007   right eye   INGUINAL HERNIA REPAIR     TONSILLECTOMY     VARICOSE VEIN SURGERY     VASECTOMY      Family History  Problem Relation Age of Onset   Lung cancer Father    Heart attack Mother 25   Heart disease  Mother        chf   Arthritis Mother    Colon cancer Neg Hx    Esophageal cancer Neg Hx    Rectal cancer Neg Hx    Stomach cancer Neg Hx     Social History   Socioeconomic History   Marital status: Married    Spouse name: Not on file   Number of children: 4   Years of education: Not on file   Highest education level: Not on file  Occupational History   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    Right handed   One story home   Social Determinants of Health   Financial Resource Strain: Low Risk  (10/18/2020)   Overall Financial Resource Strain (CARDIA)    Difficulty of Paying Living Expenses: Not hard at all  Food Insecurity: No Food Insecurity (10/18/2020)   Hunger Vital Sign    Worried About Running Out of Food in the Last Year: Never true    Ran Out of Food in the Last Year: Never true  Transportation Needs: No Transportation Needs (10/18/2020)   PRAPARE - Hydrologist (Medical): No    Lack of Transportation (Non-Medical): No  Physical Activity: Sufficiently Active (10/18/2020)   Exercise Vital Sign    Days of Exercise per Week: 6 days    Minutes of Exercise per Session: 30 min  Stress: No Stress Concern Present (10/18/2020)   [place]    Feeling of Stress : Not at all  Social Connections: Socially Isolated (10/18/2020)   Social Connection and Isolation Panel [NHANES]    Frequency  of Communication with Friends and Family: Once a week    Frequency of Social Gatherings with Friends and Family: Once a week    Attends Religious Services: Never    Marine scientist or Organizations: No    Attends Archivist Meetings: Never    Marital Status: Married  Human resources officer Violence: Not At Risk (10/18/2020)   Humiliation, Afraid, Rape, and Kick questionnaire    Fear of Current or Ex-Partner: No    Emotionally Abused: No    Physically Abused: No    Sexually Abused: No    Outpatient Medications Prior to Visit  Medication Sig Dispense Refill   Cholecalciferol 25 MCG (1000 UT) capsule Take 5,000 Units by mouth daily.     donepezil (ARICEPT) 10 MG tablet Take 1 tablet daily 180 tablet 1   ELIQUIS 5 MG TABS tablet TAKE 1 TABLET BY MOUTH TWICE A DAY (Patient taking differently: Take 5 mg by mouth 2 (two) times daily.) 180 tablet 1   Multiple Vitamin (MULTIVITAMIN) tablet Take 1 tablet by mouth daily.     Saw Palmetto 450-15 MG CAPS Take 1 capsule by mouth daily. (Patient taking differently: Take 2 capsules by mouth daily.)     vitamin B-12 (CYANOCOBALAMIN) 1000 MCG tablet Take 1 tablet by mouth daily.     atorvastatin (LIPITOR) 10 MG tablet Take 1 tablet (10 mg total) by mouth daily. (Patient not taking: Reported on 09/05/2021) 30 tablet 0   No facility-administered medications prior to visit.    Allergies  Allergen Reactions   Crestor [Rosuvastatin] Other (See Comments)    disorientation   Niacin Rash    "My whole body turned red on the prescription dose" He takes the OTC medication   Pravastatin Other (See Comments)    Disorientation    Review of Systems  Constitutional:  Positive for malaise/fatigue and weight loss (about 5 lbs since the hospital).  Gastrointestinal:  Positive for nausea.       (+) Abdominal Spasms (+) Appetite loss  Musculoskeletal:  Positive for falls.  Neurological:  Positive for headaches.  Psychiatric/Behavioral:  Positive for  depression.        Objective:    Physical Exam Constitutional:      Appearance: Normal appearance. He is not ill-appearing.  HENT:     Head: Normocephalic and atraumatic.     Right Ear:  External ear normal.     Left Ear: External ear normal.  Eyes:     Extraocular Movements: Extraocular movements intact.     Pupils: Pupils are equal, round, and reactive to light.  Cardiovascular:     Rate and Rhythm: Normal rate and regular rhythm.     Pulses: Normal pulses.     Heart sounds: Normal heart sounds. No murmur heard.    No gallop.  Pulmonary:     Effort: Pulmonary effort is normal. No respiratory distress.     Breath sounds: No wheezing or rales.  Skin:    General: Skin is warm and dry.  Neurological:     Mental Status: He is alert and oriented to person, place, and time.  Psychiatric:        Judgment: Judgment normal.     BP 120/60 (BP Location: Right Arm, Patient Position: Sitting, Cuff Size: Normal)   Pulse 64   Temp 97.7 F (36.5 C) (Oral)   Resp 18   Ht '5\' 6"'$  (1.676 m)   Wt 169 lb 6.4 oz (76.8 kg)   SpO2 95%   BMI 27.34 kg/m  Wt Readings from Last 3 Encounters:  09/05/21 169 lb 6.4 oz (76.8 kg)  08/25/21 169 lb 1.9 oz (76.7 kg)  08/22/21 171 lb (77.6 kg)    Diabetic Foot Exam - Simple   No data filed    Lab Results  Component Value Date   WBC 4.9 08/25/2021   HGB 16.7 08/25/2021   HCT 47.3 08/25/2021   PLT 150 08/25/2021   GLUCOSE 117 (H) 08/25/2021   CHOL 166 08/11/2021   TRIG 110 08/11/2021   HDL 37 (L) 08/11/2021   LDLDIRECT 86.0 06/17/2014   LDLCALC 107 (H) 08/11/2021   ALT 18 08/25/2021   AST 20 08/25/2021   NA 139 08/25/2021   K 5.5 (H) 08/25/2021   CL 101 08/25/2021   CREATININE 1.14 08/25/2021   BUN 21 08/25/2021   CO2 33 (H) 08/25/2021   TSH 2.08 08/15/2021   PSA 2.90 08/15/2021   INR 1.1 08/09/2021   HGBA1C 4.8 08/11/2021   MICROALBUR 0.7 11/15/2014    Lab Results  Component Value Date   TSH 2.08 08/15/2021   Lab Results   Component Value Date   WBC 4.9 08/25/2021   HGB 16.7 08/25/2021   HCT 47.3 08/25/2021   MCV 97.7 08/25/2021   PLT 150 08/25/2021   Lab Results  Component Value Date   NA 139 08/25/2021   K 5.5 (H) 08/25/2021   CO2 33 (H) 08/25/2021   GLUCOSE 117 (H) 08/25/2021   BUN 21 08/25/2021   CREATININE 1.14 08/25/2021   BILITOT 1.2 08/25/2021   ALKPHOS 65 08/25/2021   AST 20 08/25/2021   ALT 18 08/25/2021   PROT 7.4 08/25/2021   ALBUMIN 4.7 08/25/2021   CALCIUM 10.5 (H) 08/25/2021   ANIONGAP 5 08/25/2021   GFR 71.65 08/15/2021   Lab Results  Component Value Date   CHOL 166 08/11/2021   Lab Results  Component Value Date   HDL 37 (L) 08/11/2021   Lab Results  Component Value Date   LDLCALC 107 (H) 08/11/2021   Lab Results  Component Value Date   TRIG 110 08/11/2021   Lab Results  Component Value Date   CHOLHDL 4.5 08/11/2021   Lab Results  Component Value Date   HGBA1C 4.8 08/11/2021       Assessment & Plan:   Problem List Items Addressed This Visit  Unprioritized   Hyperlipidemia - Primary    Pt not able to tol statins---- recently stopped lipitor due to brain fog , myalgias Start zetia  Contact cardiology Check labs in 6 months       Relevant Medications   ezetimibe (ZETIA) 10 MG tablet   Other Relevant Orders   Comprehensive metabolic panel   Lipid panel   Ataxia of right upper extremity    Pt getting ot and pt         Meds ordered this encounter  Medications   ezetimibe (ZETIA) 10 MG tablet    Sig: Take 1 tablet (10 mg total) by mouth daily.    Dispense:  90 tablet    Refill:  3    I, Ann Held, DO, personally preformed the services described in this documentation.  All medical record entries made by the scribe were at my direction and in my presence.  I have reviewed the chart and discharge instructions (if applicable) and agree that the record reflects my personal performance and is accurate and complete.  09/05/2021   I,Tinashe Williams,acting as a scribe for Ann Held, DO.,have documented all relevant documentation on the behalf of Ann Held, DO,as directed by  Ann Held, DO while in the presence of Ann Held, DO.   Ann Held, DO

## 2021-09-05 NOTE — Assessment & Plan Note (Signed)
Pt getting ot and pt

## 2021-09-05 NOTE — Patient Instructions (Signed)

## 2021-09-05 NOTE — Telephone Encounter (Signed)
Pt c/o medication issue:  1. Name of Medication: Atorvastatin-  2. How are you currently taking this medication (dosage and times per day)? 1 time a day  3. Are you having a reaction (difficulty breathing--STAT)?   4. What is your medication issue? Wife said his  Primary doctor stopped the Atorvastatin. Patient could not tolerate it- She have now started him on Ezetimibe- She wanted Dr Percival Spanish to know this

## 2021-09-06 ENCOUNTER — Other Ambulatory Visit: Payer: Self-pay

## 2021-09-06 ENCOUNTER — Ambulatory Visit: Payer: Medicare HMO | Attending: Family Medicine

## 2021-09-06 DIAGNOSIS — R260 Ataxic gait: Secondary | ICD-10-CM | POA: Insufficient documentation

## 2021-09-06 DIAGNOSIS — Z79899 Other long term (current) drug therapy: Secondary | ICD-10-CM

## 2021-09-06 DIAGNOSIS — R262 Difficulty in walking, not elsewhere classified: Secondary | ICD-10-CM | POA: Insufficient documentation

## 2021-09-06 DIAGNOSIS — R2681 Unsteadiness on feet: Secondary | ICD-10-CM | POA: Diagnosis present

## 2021-09-06 DIAGNOSIS — M6281 Muscle weakness (generalized): Secondary | ICD-10-CM | POA: Insufficient documentation

## 2021-09-06 DIAGNOSIS — R270 Ataxia, unspecified: Secondary | ICD-10-CM | POA: Diagnosis present

## 2021-09-06 DIAGNOSIS — E785 Hyperlipidemia, unspecified: Secondary | ICD-10-CM

## 2021-09-06 NOTE — Telephone Encounter (Signed)
Patient's wife called back to state the blood work he will have done in 6 weeks will have the lipid panel include.

## 2021-09-06 NOTE — Telephone Encounter (Signed)
Wife stated PCP ordered lipids in 6 weeks. Recommended that she speak with PCP and determine if he agrees for lipids in 3 months. Lab slip mailed to patient's home for fasting lipids in 3 months.

## 2021-09-06 NOTE — Telephone Encounter (Signed)
Pt wife returning a call, she said she'll be home for an hour. She states if she doesn't answer, leave a detailed message

## 2021-09-06 NOTE — Addendum Note (Signed)
Addended by: Betha Loa F on: 09/06/2021 09:03 AM   Modules accepted: Orders

## 2021-09-06 NOTE — Therapy (Signed)
OUTPATIENT PHYSICAL THERAPY NEURO TREATMENT   Patient Name: Shawn [name] MRN: [id number] DOB:1934-10-23, 87 y.o., male Today's Date: 09/06/2021      PT End of Session - 09/06/21 1102     Visit Number 3    Date for PT Re-Evaluation 10/16/21    Authorization Type [employer]    Progress Note Due on Visit 10    PT Start Time 1101    PT Stop Time 1144    PT Time Calculation (min) 43 min    Equipment Utilized During Treatment Gait belt    Activity Tolerance Patient tolerated treatment well    Behavior During Therapy WFL for tasks assessed/performed               Past Medical History:  Diagnosis Date   Allergy    Cardiac murmur    mild aortic sclerosis   Cataract    left eye   Clotting disorder ([place])    Factor v Leiden factor mutation   Colon polyps    Hyperplastic   Diverticulosis    Dysphagia    Factor V Leiden mutation ([place])    Hemorrhoids    History of blood clots    Hyperlipidemia    Osteoarthritis    Past Surgical History:  Procedure Laterality Date   APPENDECTOMY     CATARACT EXTRACTION  02/09/2007   right eye   [place]     Patient Active Problem List   Diagnosis Date Noted   Ataxia of right upper extremity 08/09/2021   Otitis externa 07/07/2021   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 ([place]) 03/18/2020   High serum vitamin D 01/12/2020   Pain of left thumb 02/20/2016   Preventative health care 11/13/2013   History of colon polyps 11/02/2011   Bowel habit changes 11/02/2011   Other and unspecified coagulation defects 11/02/2011   Overweight(278.02) 10/05/2010   BACK PAIN 04/21/2010   COLONIC POLYPS, ADENOMATOUS, HX OF 03/22/2010   Factor V deficiency ([place]) 12/01/2009   THROMBOCYTOPENIA 12/01/2009   PULMONARY EMBOLISM, HX OF 12/01/2009   Pulmonary embolism ([place]) 11/20/2009   CERUMEN IMPACTION, RIGHT 07/29/2009   VARICOSE VEINS LOWER  EXTREMITIES W/INFLAMMATION 12/01/2008   Acute thromboembolism of deep veins of lower extremity ([place]) 09/03/2008   Aortic valve disorder 11/19/2007   HEPATIC CYST 11/19/2007   CALF PAIN, LEFT 11/11/2007   MEMORY LOSS 08/28/2007   MOLE 08/11/2007   Hyperlipidemia 09/04/2006   OSTEOARTHRITIS 09/04/2006   DYSPHAGIA, UNSPECIFIED 09/04/2006    PCP: Ann Held, DO  REFERRING PROVIDER: Patrecia Pour, MD  REFERRING DIAG: R27.0 (ICD-10-CM) - Ataxia of right upper extremity  THERAPY DIAG:  Muscle weakness (generalized)  Unsteadiness on feet  Difficulty in walking, not elsewhere classified  Ataxic gait  RATIONALE FOR EVALUATION AND TREATMENT:  Rehabilitation  ONSET DATE: 08/09/2021 - TIA  NEXT MD VISIT: 08/22/21 with neurologist; 09/05/2021 with PCP   SUBJECTIVE:   SUBJECTIVE STATEMENT: Feeling perfect, was not sore after last visit.   Pt accompanied by: significant other  PAIN:  Are you having pain? No  PERTINENT HISTORY:  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; memory loss; HTN; mild aortic stenosis; factor V Leiden; OA; back pain; HLD; h/o DVT and PE; recent fall (07/28/21) while on Eliquis - bruising to R shoulder and ribs  PRECAUTIONS: Fall  WEIGHT BEARING RESTRICTIONS: No  FALLS: Has patient fallen in last 6  months? Yes. Number of falls 1  LIVING ENVIRONMENT: Lives with: lives with their spouse Lives in: House/apartment Stairs: No Has following equipment at home: Single point cane, Environmental consultant - 4 wheeled, shower chair, and Grab bars  PLOF: Independent and Leisure: Reading, puzzles, building bird houses, walking daily x 3/4 mile  PATIENT GOALS: "To get back to the way I was."   OBJECTIVE:   VITAL SIGNS:  BP:  126/72 Pulse: 74 O2 sats: 93%  DIAGNOSTIC FINDINGS:  08/10/21- MRI brain: 1. No acute intracranial abnormality.  2. Findings of chronic ischemic microangiopathy and generalized volume loss. 08/09/21 - CT head: There is no acute  intracranial hemorrhage or evidence of acute infarction. ASPECT score is 10. Stable chronic/nonemergent findings. 08/09/21 - CT angio head & neck: 1. Negative for intracranial large vessel occlusion or significant stenosis.  2. No significant carotid or vertebral artery stenosis in the neck. Mild atherosclerotic disease of the carotid bifurcation bilaterally.  COGNITION: Overall cognitive status: Impaired   SENSATION: WFL  COORDINATION: Heel to shin - impaired Finger to nose - WNL Finger- thumb opposition - WNL  POSTURE:  rounded shoulders, forward head, increased thoracic kyphosis, and flexed trunk    LOWER EXTREMITY MMT:   Tested in sitting  MMT Right Eval Left Eval  Hip flexion 4 4+  Hip extension 4+ 4+  Hip abduction 5 5  Hip adduction 4 4+  Hip internal rotation 4 4  Hip external rotation 4- 4  Knee flexion 5 5  Knee extension 5 5  Ankle dorsiflexion 4+ 5  Ankle plantarflexion    Ankle inversion    Ankle eversion    (Blank rows = not tested)  BED MOBILITY:  NT  TRANSFERS: Assistive device utilized: None  Sit to stand: Complete Independence Stand to sit: Modified independence Chair to chair: Modified independence Floor:  NT  GAIT: Distance walked: 150 Assistive device utilized: None Level of assistance: SBA Gait pattern: step through pattern and ataxic Comments: initially very unsteady reaching for walls and furniture but by end of visit gait pattern considerably better  STAIRS:  Level of Assistance: SBA  Stair Negotiation Technique: Alternating Pattern  with Single Rail on Right  Number of Stairs: 14   Height of Stairs: 7  Comments: more hesitant on descent with B LE ER  FUNCTIONAL TESTS:  5 times sit to stand: 19.59 sec; recurrent falls >15 sec Timed up and go (TUG): 12.00 sec 10 meter walk test: 10.94 sec; Gait speed = 3.0 ft/sec Functional gait assessment: 17/30; < 19 = high risk fall   PATIENT SURVEYS:  ABC scale = 45% of self confidence,  55% impairment   TODAY'S TREATMENT:  09/06/21 Bike L4 X45mns STS w/OHP yellow ball 2x10 Bridges 2x10 SLR 2# 2x10 Resisted gait 20# forwards and sideways x3 Shoulder ext 5# 2x10 Knee ext 15# x10, 10# x10, 5# x10 RLE Knee flexion 20# 2x10, 15# RLE Standing on airex catch, CGA  09/04/21 Bike L4 x636ms STS 2x10 Cybex ext 10# 2x10 Cybex flexion 20# 2x10 Step ups 6" x10 each side Step up on Airex x10 each side Walk on beam in // bars  2 way hip 2# 2x10 BLE   08/17/2021 Eval only   PATIENT EDUCATION: Education details: PT eval findings, anticipated POC, and recommendation for OT referral to address UE ataxia as primary concerns seem to be fine motor (pt has appt with neurologist tomorrow) Person educated: Patient and Spouse Education method: Explanation and blank referral form provided to have MD complete  for OT referral Education comprehension: verbalized understanding   HOME EXERCISE PROGRAM: TBD   ASSESSMENT:  CLINICAL IMPRESSION: Patient is showing good progress with overall strength and balance. Requires CGA on Airex due to unsteadiness. We did LE strengthening and functional training today. Did more reps on his R side as it is weaker compared to the L. He tolerated session well without any complaints. Will continue with strength and balance training.     OBJECTIVE IMPAIRMENTS Abnormal gait, decreased activity tolerance, decreased balance, decreased cognition, decreased coordination, decreased endurance, decreased knowledge of condition, decreased knowledge of use of DME, decreased mobility, difficulty walking, decreased strength, decreased safety awareness, dizziness, impaired perceived functional ability, impaired UE functional use, improper body mechanics, postural dysfunction, and pain.   ACTIVITY LIMITATIONS carrying, lifting, bending, standing, transfers, reach over head, locomotion level, and writing  PARTICIPATION LIMITATIONS: meal prep, cleaning, laundry,  driving, shopping, community activity, and yard work  PERSONAL FACTORS Age, Past/current experiences, Time since onset of injury/illness/exacerbation, and 3+ comorbidities: 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; memory loss; HTN; mild aortic stenosis; factor V Leiden; OA; back pain; HLD; h/o DVT and PE; recent fall (07/28/21) while on Eliquis - bruising to R shoulder and ribs  are also affecting patient's functional outcome.   REHAB POTENTIAL: Good  CLINICAL DECISION MAKING: Evolving/moderate complexity  EVALUATION COMPLEXITY: Moderate   GOALS: Goals reviewed with patient? Yes  SHORT TERM GOALS: Target date: 09/18/2021   Patient will be independent with initial HEP. Baseline:  Goal status: INITIAL  2.  Patient will demonstrate decreased fall risk by scoring < 15 sec on 5xSTS. Baseline: 19.59 sec Goal status: INITIAL  3.  Patient will be educated on strategies to decrease risk of falls.  Baseline:  Goal status: INITIAL   LONG TERM GOALS: Target date: 10/16/2021   Patient will be independent with advanced/ongoing HEP to improve outcomes and carryover.  Baseline:  Goal status: INITIAL  2.  Patient will be able to ambulate 600' with LRAD with good safety to access community.  Baseline:  Goal status: INITIAL  3.  Patient will be able to step up/down curb safely with LRAD for safety with community ambulation.  Baseline:  Goal status: INITIAL   4.  Patient will demonstrate improved B LE strength to >/= 4+/5 for improved stability and ease of mobility . Baseline: refer to above MMT chart Goal status: INITIAL  5.  Patient will demonstrate at least 25/30 on DGI to improve gait stability and reduce risk for falls. Baseline: 17/30 Goal status: INITIAL  6.  Patient will report 65% of self confidence (35% impairment) on ABC scale to demonstrate improved functional ability. Baseline: 45% of self confidence, 55% impairment Goal status: INITIAL   PLAN: PT  FREQUENCY: 2x/week  PT DURATION: 8 weeks  PLANNED INTERVENTIONS: Therapeutic exercises, Therapeutic activity, Neuromuscular re-education, Balance training, Gait training, Patient/Family education, Self Care, Joint mobilization, Stair training, DME instructions, Cryotherapy, Moist heat, Taping, Ultrasound, Ionotophoresis '4mg'$ /ml Dexamethasone, Manual therapy, and Re-evaluation  PLAN FOR NEXT SESSION: Create initial HEP for LE strengthening and balance   Andris Baumann, PT 09/06/2021, 11:47 AM

## 2021-09-06 NOTE — Telephone Encounter (Signed)
LMTCB.  Order placed for fasting lipids.

## 2021-09-07 ENCOUNTER — Encounter: Payer: Medicare HMO | Admitting: Physical Therapy

## 2021-09-11 ENCOUNTER — Encounter: Payer: Self-pay | Admitting: Physical Therapy

## 2021-09-11 ENCOUNTER — Ambulatory Visit: Payer: Medicare HMO | Admitting: Physical Therapy

## 2021-09-11 ENCOUNTER — Ambulatory Visit: Payer: Medicare HMO | Admitting: Occupational Therapy

## 2021-09-11 DIAGNOSIS — M6281 Muscle weakness (generalized): Secondary | ICD-10-CM

## 2021-09-11 DIAGNOSIS — R2681 Unsteadiness on feet: Secondary | ICD-10-CM

## 2021-09-11 DIAGNOSIS — R262 Difficulty in walking, not elsewhere classified: Secondary | ICD-10-CM | POA: Diagnosis not present

## 2021-09-11 DIAGNOSIS — R270 Ataxia, unspecified: Secondary | ICD-10-CM | POA: Diagnosis not present

## 2021-09-11 DIAGNOSIS — R260 Ataxic gait: Secondary | ICD-10-CM

## 2021-09-11 NOTE — Therapy (Signed)
OUTPATIENT PHYSICAL THERAPY NEURO TREATMENT   Patient Name: Shawn [name] MRN: [id number] DOB:1934-04-19, 87 y.o., male Today's Date: 09/11/2021      PT End of Session - 09/11/21 1058     Visit Number 4    Date for PT Re-Evaluation 10/16/21    Authorization Type [employer]    PT Start Time 1058    PT Stop Time 1146    PT Time Calculation (min) 48 min    Activity Tolerance Patient tolerated treatment well    Behavior During Therapy WFL for tasks assessed/performed               Past Medical History:  Diagnosis Date   Allergy    Cardiac murmur    mild aortic sclerosis   Cataract    left eye   Clotting disorder ([place])    Factor v Leiden factor mutation   Colon polyps    Hyperplastic   Diverticulosis    Dysphagia    Factor V Leiden mutation ([place])    Hemorrhoids    History of blood clots    Hyperlipidemia    Osteoarthritis    Past Surgical History:  Procedure Laterality Date   APPENDECTOMY     CATARACT EXTRACTION  02/09/2007   right eye   [place]     Patient Active Problem List   Diagnosis Date Noted   Ataxia of right upper extremity 08/09/2021   Otitis externa 07/07/2021   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 ([place]) 03/18/2020   High serum vitamin D 01/12/2020   Pain of left thumb 02/20/2016   Preventative health care 11/13/2013   History of colon polyps 11/02/2011   Bowel habit changes 11/02/2011   Other and unspecified coagulation defects 11/02/2011   Overweight(278.02) 10/05/2010   BACK PAIN 04/21/2010   COLONIC POLYPS, ADENOMATOUS, HX OF 03/22/2010   Factor V deficiency ([place]) 12/01/2009   THROMBOCYTOPENIA 12/01/2009   PULMONARY EMBOLISM, HX OF 12/01/2009   Pulmonary embolism ([place]) 11/20/2009   CERUMEN IMPACTION, RIGHT 07/29/2009   VARICOSE VEINS LOWER EXTREMITIES W/INFLAMMATION 12/01/2008   Acute thromboembolism of deep veins of lower  extremity ([place]) 09/03/2008   Aortic valve disorder 11/19/2007   HEPATIC CYST 11/19/2007   CALF PAIN, LEFT 11/11/2007   MEMORY LOSS 08/28/2007   MOLE 08/11/2007   Hyperlipidemia 09/04/2006   OSTEOARTHRITIS 09/04/2006   DYSPHAGIA, UNSPECIFIED 09/04/2006    PCP: Ann Held, DO  REFERRING PROVIDER: Patrecia Pour, MD  REFERRING DIAG: R27.0 (ICD-10-CM) - Ataxia of right upper extremity  THERAPY DIAG:  Muscle weakness (generalized)  Unsteadiness on feet  Difficulty in walking, not elsewhere classified  Ataxic gait  RATIONALE FOR EVALUATION AND TREATMENT:  Rehabilitation  ONSET DATE: 08/09/2021 - TIA  NEXT MD VISIT: 08/22/21 with neurologist; 09/05/2021 with PCP   SUBJECTIVE:   SUBJECTIVE STATEMENT: No falls, I think I am a little stronger  Pt accompanied by: significant other  PAIN:  Are you having pain? No  PERTINENT HISTORY:  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; memory loss; HTN; mild aortic stenosis; factor V Leiden; OA; back pain; HLD; h/o DVT and PE; recent fall (07/28/21) while on Eliquis - bruising to R shoulder and ribs  PRECAUTIONS: Fall  WEIGHT BEARING RESTRICTIONS: No  FALLS: Has patient fallen in last 6 months? Yes. Number of falls 1  LIVING ENVIRONMENT: Lives with: lives with their spouse Lives in: House/apartment  Stairs: No Has following equipment at home: Single point cane, Walker - 4 wheeled, shower chair, and Grab bars  PLOF: Independent and Leisure: Reading, puzzles, building bird houses, walking daily x 3/4 mile  PATIENT GOALS: "To get back to the way I was."   OBJECTIVE:   VITAL SIGNS:  BP:  126/72 Pulse: 74 O2 sats: 93%  DIAGNOSTIC FINDINGS:  08/10/21- MRI brain: 1. No acute intracranial abnormality.  2. Findings of chronic ischemic microangiopathy and generalized volume loss. 08/09/21 - CT head: There is no acute intracranial hemorrhage or evidence of acute infarction. ASPECT score is 10. Stable  chronic/nonemergent findings. 08/09/21 - CT angio head & neck: 1. Negative for intracranial large vessel occlusion or significant stenosis.  2. No significant carotid or vertebral artery stenosis in the neck. Mild atherosclerotic disease of the carotid bifurcation bilaterally.  COGNITION: Overall cognitive status: Impaired   SENSATION: WFL  COORDINATION: Heel to shin - impaired Finger to nose - WNL Finger- thumb opposition - WNL  POSTURE:  rounded shoulders, forward head, increased thoracic kyphosis, and flexed trunk    LOWER EXTREMITY MMT:   Tested in sitting  MMT Right Eval Left Eval  Hip flexion 4 4+  Hip extension 4+ 4+  Hip abduction 5 5  Hip adduction 4 4+  Hip internal rotation 4 4  Hip external rotation 4- 4  Knee flexion 5 5  Knee extension 5 5  Ankle dorsiflexion 4+ 5  Ankle plantarflexion    Ankle inversion    Ankle eversion    (Blank rows = not tested)  BED MOBILITY:  NT  TRANSFERS: Assistive device utilized: None  Sit to stand: Complete Independence Stand to sit: Modified independence Chair to chair: Modified independence Floor:  NT  GAIT: Distance walked: 150 Assistive device utilized: None Level of assistance: SBA Gait pattern: step through pattern and ataxic Comments: initially very unsteady reaching for walls and furniture but by end of visit gait pattern considerably better  STAIRS:  Level of Assistance: SBA  Stair Negotiation Technique: Alternating Pattern  with Single Rail on Right  Number of Stairs: 14   Height of Stairs: 7  Comments: more hesitant on descent with B LE ER  FUNCTIONAL TESTS:  5 times sit to stand: 19.59 sec; recurrent falls >15 sec Timed up and go (TUG): 12.00 sec 10 meter walk test: 10.94 sec; Gait speed = 3.0 ft/sec Functional gait assessment: 17/30; < 19 = high risk fall   PATIENT SURVEYS:  ABC scale = 45% of self confidence, 55% impairment   TODAY'S TREATMENT:  09/11/21 Bike Level 4 x 6 minutes Gait  outside around the back building Side step on and off airex On airex ball toss On airex cone toe and hand touches Airex balance beam side stepping Side stepping over half foam roll Walking ball toss STS with OHP with yellow weighted ball 5# leg extension 2x10, then 2x5 with right only 25# leg curls 2x10 Leg press 20# 2x10, then right only x10  09/06/21 Bike L4 X94mns STS w/OHP yellow ball 2x10 Bridges 2x10 SLR 2# 2x10 Resisted gait 20# forwards and sideways x3 Shoulder ext 5# 2x10 Knee ext 15# x10, 10# x10, 5# x10 RLE Knee flexion 20# 2x10, 15# RLE Standing on airex catch, CGA  09/04/21 Bike L4 x61ms STS 2x10 Cybex ext 10# 2x10 Cybex flexion 20# 2x10 Step ups 6" x10 each side Step up on Airex x10 each side Walk on beam in // bars  2 way hip 2# 2x10 BLE  08/17/2021 Eval only   PATIENT EDUCATION: Education details: PT eval findings, anticipated POC, and recommendation for OT referral to address UE ataxia as primary concerns seem to be fine motor (pt has appt with neurologist tomorrow) Person educated: Patient and Spouse Education method: Explanation and blank referral form provided to have MD complete for OT referral Education comprehension: verbalized understanding   HOME EXERCISE PROGRAM: TBD   ASSESSMENT:  Clinical impression:  Todays treatment focused a little more on the balance, he had the most difficulty on the dynamic surfaces.  He does have right LE that is weaker than the left.  On the airrex activities needed CGA due to unsteadiness  OBJECTIVE IMPAIRMENTS Abnormal gait, decreased activity tolerance, decreased balance, decreased cognition, decreased coordination, decreased endurance, decreased knowledge of condition, decreased knowledge of use of DME, decreased mobility, difficulty walking, decreased strength, decreased safety awareness, dizziness, impaired perceived functional ability, impaired UE functional use, improper body mechanics, postural  dysfunction, and pain.   ACTIVITY LIMITATIONS carrying, lifting, bending, standing, transfers, reach over head, locomotion level, and writing  PARTICIPATION LIMITATIONS: meal prep, cleaning, laundry, driving, shopping, community activity, and yard work  PERSONAL FACTORS Age, Past/current experiences, Time since onset of injury/illness/exacerbation, and 3+ comorbidities: 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; memory loss; HTN; mild aortic stenosis; factor V Leiden; OA; back pain; HLD; h/o DVT and PE; recent fall (07/28/21) while on Eliquis - bruising to R shoulder and ribs  are also affecting patient's functional outcome.   REHAB POTENTIAL: Good  CLINICAL DECISION MAKING: Evolving/moderate complexity  EVALUATION COMPLEXITY: Moderate   GOALS: Goals reviewed with patient? Yes  SHORT TERM GOALS: Target date: 09/18/2021   Patient will be independent with initial HEP. Baseline:  Goal status: ongoing  2.  Patient will demonstrate decreased fall risk by scoring < 15 sec on 5xSTS. Baseline: 19.59 sec Goal status:  ongoing  3.  Patient will be educated on strategies to decrease risk of falls.  Baseline:  Goal status: ongoing   LONG TERM GOALS: Target date: 10/16/2021   Patient will be independent with advanced/ongoing HEP to improve outcomes and carryover.  Baseline:  Goal status: INITIAL  2.  Patient will be able to ambulate 600' with LRAD with good safety to access community.  Baseline:  Goal status: INITIAL  3.  Patient will be able to step up/down curb safely with LRAD for safety with community ambulation.  Baseline:  Goal status: INITIAL   4.  Patient will demonstrate improved B LE strength to >/= 4+/5 for improved stability and ease of mobility . Baseline: refer to above MMT chart Goal status: INITIAL  5.  Patient will demonstrate at least 25/30 on DGI to improve gait stability and reduce risk for falls. Baseline: 17/30 Goal status: INITIAL  6.   Patient will report 65% of self confidence (35% impairment) on ABC scale to demonstrate improved functional ability. Baseline: 45% of self confidence, 55% impairment Goal status: INITIAL   PLAN: PT FREQUENCY: 2x/week  PT DURATION: 8 weeks  PLANNED INTERVENTIONS: Therapeutic exercises, Therapeutic activity, Neuromuscular re-education, Balance training, Gait training, Patient/Family education, Self Care, Joint mobilization, Stair training, DME instructions, Cryotherapy, Moist heat, Taping, Ultrasound, Ionotophoresis '4mg'$ /ml Dexamethasone, Manual therapy, and Re-evaluation  PLAN FOR NEXT SESSION: Work on balance and [employer], PT 09/11/2021, 10:59 AM

## 2021-09-13 ENCOUNTER — Ambulatory Visit: Payer: Medicare HMO | Admitting: Physical Therapy

## 2021-09-15 ENCOUNTER — Ambulatory Visit: Payer: Medicare HMO | Admitting: Physical Therapy

## 2021-09-15 ENCOUNTER — Ambulatory Visit: Payer: Medicare HMO | Admitting: Occupational Therapy

## 2021-09-15 DIAGNOSIS — R270 Ataxia, unspecified: Secondary | ICD-10-CM | POA: Diagnosis not present

## 2021-09-15 DIAGNOSIS — M6281 Muscle weakness (generalized): Secondary | ICD-10-CM | POA: Diagnosis not present

## 2021-09-15 DIAGNOSIS — R2681 Unsteadiness on feet: Secondary | ICD-10-CM | POA: Diagnosis not present

## 2021-09-15 DIAGNOSIS — R262 Difficulty in walking, not elsewhere classified: Secondary | ICD-10-CM | POA: Diagnosis not present

## 2021-09-15 DIAGNOSIS — R260 Ataxic gait: Secondary | ICD-10-CM | POA: Diagnosis not present

## 2021-09-15 NOTE — Therapy (Signed)
OUTPATIENT PHYSICAL THERAPY NEURO TREATMENT   Patient Name: Shawn [name] MRN: [id number] DOB:22-Oct-1934, 87 y.o., male Today's Date: 09/15/2021      PT End of Session - 09/15/21 1008     Visit Number 5    Date for PT Re-Evaluation 10/16/21    Authorization Type [employer] Medicare    PT Start Time 1008    PT Stop Time 8338    PT Time Calculation (min) 44 min               Past Medical History:  Diagnosis Date   Allergy    Cardiac murmur    mild aortic sclerosis   Cataract    left eye   Clotting disorder ([place])    Factor v Leiden factor mutation   Colon polyps    Hyperplastic   Diverticulosis    Dysphagia    Factor V Leiden mutation ([place])    Hemorrhoids    History of blood clots    Hyperlipidemia    Osteoarthritis    Past Surgical History:  Procedure Laterality Date   APPENDECTOMY     CATARACT EXTRACTION  02/09/2007   right eye   [place]     Patient Active Problem List   Diagnosis Date Noted   Ataxia of right upper extremity 08/09/2021   Otitis externa 07/07/2021   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 ([place]) 03/18/2020   High serum vitamin D 01/12/2020   Pain of left thumb 02/20/2016   Preventative health care 11/13/2013   History of colon polyps 11/02/2011   Bowel habit changes 11/02/2011   Other and unspecified coagulation defects 11/02/2011   Overweight(278.02) 10/05/2010   BACK PAIN 04/21/2010   COLONIC POLYPS, ADENOMATOUS, HX OF 03/22/2010   Factor V deficiency ([place]) 12/01/2009   THROMBOCYTOPENIA 12/01/2009   PULMONARY EMBOLISM, HX OF 12/01/2009   Pulmonary embolism ([place]) 11/20/2009   CERUMEN IMPACTION, RIGHT 07/29/2009   VARICOSE VEINS LOWER EXTREMITIES W/INFLAMMATION 12/01/2008   Acute thromboembolism of deep veins of lower extremity ([place]) 09/03/2008   Aortic valve disorder 11/19/2007   HEPATIC CYST 11/19/2007   CALF PAIN, LEFT  11/11/2007   MEMORY LOSS 08/28/2007   MOLE 08/11/2007   Hyperlipidemia 09/04/2006   OSTEOARTHRITIS 09/04/2006   DYSPHAGIA, UNSPECIFIED 09/04/2006    PCP: Ann Held, DO  REFERRING PROVIDER: Patrecia Pour, MD  REFERRING DIAG: R27.0 (ICD-10-CM) - Ataxia of right upper extremity  THERAPY DIAG:  Muscle weakness (generalized)  Unsteadiness on feet  Difficulty in walking, not elsewhere classified  RATIONALE FOR EVALUATION AND TREATMENT:  Rehabilitation  ONSET DATE: 08/09/2021 - TIA  NEXT MD VISIT: 08/22/21 with neurologist; 09/05/2021 with PCP   SUBJECTIVE:   SUBJECTIVE STATEMENT: No falls or stumbles. Doing okay so far- but its early  PAIN:  Are you having pain? No  PERTINENT HISTORY:  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; memory loss; HTN; mild aortic stenosis; factor V Leiden; OA; back pain; HLD; h/o DVT and PE; recent fall (07/28/21) while on Eliquis - bruising to R shoulder and ribs  PRECAUTIONS: Fall  WEIGHT BEARING RESTRICTIONS: No  FALLS: Has patient fallen in last 6 months? Yes. Number of falls 1  LIVING ENVIRONMENT: Lives with: lives with their spouse Lives in: House/apartment Stairs: No Has following equipment at home: Single point cane, Environmental consultant - 4 wheeled, shower chair, and Grab bars  PLOF: Independent and Leisure: Reading, puzzles,  building bird houses, walking daily x 3/4 mile  PATIENT GOALS: "To get back to the way I was."   OBJECTIVE:   VITAL SIGNS:  BP:  126/72 Pulse: 74 O2 sats: 93%  DIAGNOSTIC FINDINGS:  08/10/21- MRI brain: 1. No acute intracranial abnormality.  2. Findings of chronic ischemic microangiopathy and generalized volume loss. 08/09/21 - CT head: There is no acute intracranial hemorrhage or evidence of acute infarction. ASPECT score is 10. Stable chronic/nonemergent findings. 08/09/21 - CT angio head & neck: 1. Negative for intracranial large vessel occlusion or significant stenosis.  2. No significant  carotid or vertebral artery stenosis in the neck. Mild atherosclerotic disease of the carotid bifurcation bilaterally.  COGNITION: Overall cognitive status: Impaired   SENSATION: WFL  COORDINATION: Heel to shin - impaired Finger to nose - WNL Finger- thumb opposition - WNL  POSTURE:  rounded shoulders, forward head, increased thoracic kyphosis, and flexed trunk    LOWER EXTREMITY MMT:   Tested in sitting  MMT Right Eval Left Eval  Hip flexion 4 4+  Hip extension 4+ 4+  Hip abduction 5 5  Hip adduction 4 4+  Hip internal rotation 4 4  Hip external rotation 4- 4  Knee flexion 5 5  Knee extension 5 5  Ankle dorsiflexion 4+ 5  Ankle plantarflexion    Ankle inversion    Ankle eversion    (Blank rows = not tested)  BED MOBILITY:  NT  TRANSFERS: Assistive device utilized: None  Sit to stand: Complete Independence Stand to sit: Modified independence Chair to chair: Modified independence Floor:  NT  GAIT: Distance walked: 150 Assistive device utilized: None Level of assistance: SBA Gait pattern: step through pattern and ataxic Comments: initially very unsteady reaching for walls and furniture but by end of visit gait pattern considerably better  STAIRS:  Level of Assistance: SBA  Stair Negotiation Technique: Alternating Pattern  with Single Rail on Right  Number of Stairs: 14   Height of Stairs: 7  Comments: more hesitant on descent with B LE ER  FUNCTIONAL TESTS:  5 times sit to stand: 19.59 sec; recurrent falls >15 sec Timed up and go (TUG): 12.00 sec 10 meter walk test: 10.94 sec; Gait speed = 3.0 ft/sec Functional gait assessment: 17/30; < 19 = high risk fall   PATIENT SURVEYS:  ABC scale = 45% of self confidence, 55% impairment   TODAY'S TREATMENT:   09/15/21  Nustep L 5 6 min Resisted gait fwd 5 x, back ward 5 x, each side 3 x 20 #, CGA with cuing to increase step length High level Balance actvities on airex beams- marching fwd and back, tandem  fwd and back and side stepping SBA Ladder step over step and lateral stepping - working to pick feet up and clear rung, cuing to hold head up STS with wt ball press 10 x chest press then 10x OH press on airex CGA Knee ext 10# 2 sets 10 with cuing for ful TKE. 5# Rt only 2 sets 5 HS curl 25 # 2 sets 12, RT only 10# 10 x Ball toss on airex SBA Ball toss and kicks alternating- trouble with quick reaction to kick esp with left leg Leg Press 20# 3 sets 10 feet 3 way     09/11/21 Bike Level 4 x 6 minutes Gait outside around the back building Side step on and off airex On airex ball toss On airex cone toe and hand touches Airex balance beam side stepping Side stepping over  half foam roll Walking ball toss STS with OHP with yellow weighted ball 5# leg extension 2x10, then 2x5 with right only 25# leg curls 2x10 Leg press 20# 2x10, then right only x10  09/06/21 Bike L4 X11mns STS w/OHP yellow ball 2x10 Bridges 2x10 SLR 2# 2x10 Resisted gait 20# forwards and sideways x3 Shoulder ext 5# 2x10 Knee ext 15# x10, 10# x10, 5# x10 RLE Knee flexion 20# 2x10, 15# RLE Standing on airex catch, CGA  09/04/21 Bike L4 x662ms STS 2x10 Cybex ext 10# 2x10 Cybex flexion 20# 2x10 Step ups 6" x10 each side Step up on Airex x10 each side Walk on beam in // bars  2 way hip 2# 2x10 BLE   08/17/2021 Eval only   PATIENT EDUCATION: Education details: PT eval findings, anticipated POC, and recommendation for OT referral to address UE ataxia as primary concerns seem to be fine motor (pt has appt with neurologist tomorrow) Person educated: Patient and Spouse Education method: Explanation and blank referral form provided to have MD complete for OT referral Education comprehension: verbalized understanding   HOME EXERCISE PROGRAM: TBD   ASSESSMENT:  Clinical impression:  Todays treatment focused on LE strength balance- see above for cuing and deficiets. HEP not issued as pt still needs significant  cuing OBJECTIVE IMPAIRMENTS Abnormal gait, decreased activity tolerance, decreased balance, decreased cognition, decreased coordination, decreased endurance, decreased knowledge of condition, decreased knowledge of use of DME, decreased mobility, difficulty walking, decreased strength, decreased safety awareness, dizziness, impaired perceived functional ability, impaired UE functional use, improper body mechanics, postural dysfunction, and pain.   ACTIVITY LIMITATIONS carrying, lifting, bending, standing, transfers, reach over head, locomotion level, and writing  PARTICIPATION LIMITATIONS: meal prep, cleaning, laundry, driving, shopping, community activity, and yard work  PERSONAL FACTORS Age, Past/current experiences, Time since onset of injury/illness/exacerbation, and 3+ comorbidities: 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; memory loss; HTN; mild aortic stenosis; factor V Leiden; OA; back pain; HLD; h/o DVT and PE; recent fall (07/28/21) while on Eliquis - bruising to R shoulder and ribs  are also affecting patient's functional outcome.   REHAB POTENTIAL: Good  CLINICAL DECISION MAKING: Evolving/moderate complexity  EVALUATION COMPLEXITY: Moderate   GOALS: Goals reviewed with patient? Yes  SHORT TERM GOALS: Target date: 09/18/2021   Patient will be independent with initial HEP. Baseline:  Goal status: ongoing  2.  Patient will demonstrate decreased fall risk by scoring < 15 sec on 5xSTS. Baseline: 19.59 sec Goal status:  ongoing  3.  Patient will be educated on strategies to decrease risk of falls.  Baseline:  Goal status: ongoing   LONG TERM GOALS: Target date: 10/16/2021   Patient will be independent with advanced/ongoing HEP to improve outcomes and carryover.  Baseline:  Goal status: INITIAL  2.  Patient will be able to ambulate 600' with LRAD with good safety to access community.  Baseline:  Goal status: INITIAL  3.  Patient will be able to step  up/down curb safely with LRAD for safety with community ambulation.  Baseline:  Goal status: INITIAL   4.  Patient will demonstrate improved B LE strength to >/= 4+/5 for improved stability and ease of mobility . Baseline: refer to above MMT chart Goal status: INITIAL  5.  Patient will demonstrate at least 25/30 on DGI to improve gait stability and reduce risk for falls. Baseline: 17/30 Goal status: INITIAL  6.  Patient will report 65% of self confidence (35% impairment) on ABC scale to demonstrate improved functional ability. Baseline: 45% of  self confidence, 55% impairment Goal status: INITIAL   PLAN: PT FREQUENCY: 2x/week  PT DURATION: 8 weeks  PLANNED INTERVENTIONS: Therapeutic exercises, Therapeutic activity, Neuromuscular re-education, Balance training, Gait training, Patient/Family education, Self Care, Joint mobilization, Stair training, DME instructions, Cryotherapy, Moist heat, Taping, Ultrasound, Ionotophoresis '4mg'$ /ml Dexamethasone, Manual therapy, and Re-evaluation  PLAN FOR NEXT SESSION: Work on balance and strength. Issue HEP   Kaila Devries,ANGIE, PTA 09/15/2021, 10:11 AM [place] [place]. [place], [place], [address] Phone: [phone]   Fax:  [phone]  Patient Details  Name: Shawn [name] MRN: [id number] Date of Birth: 12-Mar-1934 Referring Provider:  Ann Held, *  Encounter Date: 09/15/2021   Laqueta Carina, PTA 09/15/2021, 10:11 AM  [place] [place], [place], [address] Phone: [phone]   Fax:  [phone]

## 2021-09-16 ENCOUNTER — Other Ambulatory Visit: Payer: Self-pay | Admitting: Family Medicine

## 2021-09-18 ENCOUNTER — Ambulatory Visit: Payer: Medicare HMO

## 2021-09-18 DIAGNOSIS — R260 Ataxic gait: Secondary | ICD-10-CM

## 2021-09-18 DIAGNOSIS — M6281 Muscle weakness (generalized): Secondary | ICD-10-CM

## 2021-09-18 DIAGNOSIS — R2681 Unsteadiness on feet: Secondary | ICD-10-CM

## 2021-09-18 DIAGNOSIS — R262 Difficulty in walking, not elsewhere classified: Secondary | ICD-10-CM

## 2021-09-18 NOTE — Therapy (Signed)
OUTPATIENT PHYSICAL THERAPY NEURO TREATMENT   Patient Name: Shawn [name] MRN: [id number] DOB:Mar 11, 1934, 87 y.o., male Today's Date: 09/18/2021      PT End of Session - 09/18/21 1104     Visit Number 6    Date for PT Re-Evaluation 10/16/21    Authorization Type [employer] Medicare    PT Start Time 1100    PT Stop Time 1140    PT Time Calculation (min) 40 min    Activity Tolerance Patient tolerated treatment well    Behavior During Therapy WFL for tasks assessed/performed                Past Medical History:  Diagnosis Date   Allergy    Cardiac murmur    mild aortic sclerosis   Cataract    left eye   Clotting disorder ([place])    Factor v Leiden factor mutation   Colon polyps    Hyperplastic   Diverticulosis    Dysphagia    Factor V Leiden mutation ([place])    Hemorrhoids    History of blood clots    Hyperlipidemia    Osteoarthritis    Past Surgical History:  Procedure Laterality Date   APPENDECTOMY     CATARACT EXTRACTION  02/09/2007   right eye   [place]     Patient Active Problem List   Diagnosis Date Noted   Ataxia of right upper extremity 08/09/2021   Otitis externa 07/07/2021   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 ([place]) 03/18/2020   High serum vitamin D 01/12/2020   Pain of left thumb 02/20/2016   Preventative health care 11/13/2013   History of colon polyps 11/02/2011   Bowel habit changes 11/02/2011   Other and unspecified coagulation defects 11/02/2011   Overweight(278.02) 10/05/2010   BACK PAIN 04/21/2010   COLONIC POLYPS, ADENOMATOUS, HX OF 03/22/2010   Factor V deficiency ([place]) 12/01/2009   THROMBOCYTOPENIA 12/01/2009   PULMONARY EMBOLISM, HX OF 12/01/2009   Pulmonary embolism ([place]) 11/20/2009   CERUMEN IMPACTION, RIGHT 07/29/2009   VARICOSE VEINS LOWER EXTREMITIES W/INFLAMMATION 12/01/2008   Acute thromboembolism of deep veins of  lower extremity ([place]) 09/03/2008   Aortic valve disorder 11/19/2007   HEPATIC CYST 11/19/2007   CALF PAIN, LEFT 11/11/2007   MEMORY LOSS 08/28/2007   MOLE 08/11/2007   Hyperlipidemia 09/04/2006   OSTEOARTHRITIS 09/04/2006   DYSPHAGIA, UNSPECIFIED 09/04/2006    PCP: Ann Held, DO  REFERRING PROVIDER: Patrecia Pour, MD  REFERRING DIAG: R27.0 (ICD-10-CM) - Ataxia of right upper extremity  THERAPY DIAG:  Muscle weakness (generalized)  Unsteadiness on feet  Difficulty in walking, not elsewhere classified  Ataxic gait  RATIONALE FOR EVALUATION AND TREATMENT:  Rehabilitation  ONSET DATE: 08/09/2021 - TIA  NEXT MD VISIT: 08/22/21 with neurologist; 09/05/2021 with PCP   SUBJECTIVE:   SUBJECTIVE STATEMENT: No falls or stumbles, I am doing fine. Wife states he said his balance wasn't doing so well this morning.   PAIN:  Are you having pain? No  PERTINENT HISTORY:  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; memory loss; HTN; mild aortic stenosis; factor V Leiden; OA; back pain; HLD; h/o DVT and PE; recent fall (07/28/21) while on Eliquis - bruising to R shoulder and ribs  PRECAUTIONS: Fall  WEIGHT BEARING RESTRICTIONS: No  FALLS: Has patient fallen in last 6 months? Yes. Number of falls 1  LIVING ENVIRONMENT: Lives with:  lives with their spouse Lives in: House/apartment Stairs: No Has following equipment at home: Single point cane, Environmental consultant - 4 wheeled, shower chair, and Grab bars  PLOF: Independent and Leisure: Reading, puzzles, building bird houses, walking daily x 3/4 mile  PATIENT GOALS: "To get back to the way I was."   OBJECTIVE:   VITAL SIGNS:  BP:  126/72 Pulse: 74 O2 sats: 93%  DIAGNOSTIC FINDINGS:  08/10/21- MRI brain: 1. No acute intracranial abnormality.  2. Findings of chronic ischemic microangiopathy and generalized volume loss. 08/09/21 - CT head: There is no acute intracranial hemorrhage or evidence of acute infarction. ASPECT  score is 10. Stable chronic/nonemergent findings. 08/09/21 - CT angio head & neck: 1. Negative for intracranial large vessel occlusion or significant stenosis.  2. No significant carotid or vertebral artery stenosis in the neck. Mild atherosclerotic disease of the carotid bifurcation bilaterally.  COGNITION: Overall cognitive status: Impaired   SENSATION: WFL  COORDINATION: Heel to shin - impaired Finger to nose - WNL Finger- thumb opposition - WNL  POSTURE:  rounded shoulders, forward head, increased thoracic kyphosis, and flexed trunk    LOWER EXTREMITY MMT:   Tested in sitting  MMT Right Eval Left Eval  Hip flexion 4 4+  Hip extension 4+ 4+  Hip abduction 5 5  Hip adduction 4 4+  Hip internal rotation 4 4  Hip external rotation 4- 4  Knee flexion 5 5  Knee extension 5 5  Ankle dorsiflexion 4+ 5  Ankle plantarflexion    Ankle inversion    Ankle eversion    (Blank rows = not tested)  BED MOBILITY:  NT  TRANSFERS: Assistive device utilized: None  Sit to stand: Complete Independence Stand to sit: Modified independence Chair to chair: Modified independence Floor:  NT  GAIT: Distance walked: 150 Assistive device utilized: None Level of assistance: SBA Gait pattern: step through pattern and ataxic Comments: initially very unsteady reaching for walls and furniture but by end of visit gait pattern considerably better  STAIRS:  Level of Assistance: SBA  Stair Negotiation Technique: Alternating Pattern  with Single Rail on Right  Number of Stairs: 14   Height of Stairs: 7  Comments: more hesitant on descent with B LE ER  FUNCTIONAL TESTS:  5 times sit to stand: 19.59 sec; recurrent falls >15 sec Timed up and go (TUG): 12.00 sec 10 meter walk test: 10.94 sec; Gait speed = 3.0 ft/sec Functional gait assessment: 17/30; < 19 = high risk fall   PATIENT SURVEYS:  ABC scale = 45% of self confidence, 55% impairment   TODAY'S TREATMENT:  09/18/21 Bike L4 x15mns    Knee ext 15# 2x10, 10#x10 RLE HS curls 25# 2x10, 15#x10 RLE Leg press 20# 2x10  STS w/OHP on airex 2x10  Step up on airex Side steps on airex    09/15/21  Nustep L 5 6 min Resisted gait fwd 5 x, back ward 5 x, each side 3 x 20 #, CGA with cuing to increase step length High level Balance actvities on airex beams- marching fwd and back, tandem fwd and back and side stepping SBA Ladder step over step and lateral stepping - working to pick feet up and clear rung, cuing to hold head up STS with wt ball press 10 x chest press then 10x OH press on airex CGA Knee ext 10# 2 sets 10 with cuing for ful TKE. 5# Rt only 2 sets 5 HS curl 25 # 2 sets 12, RT only 10# 10  x Ball toss on airex SBA Ball toss and kicks alternating- trouble with quick reaction to kick esp with left leg Leg Press 20# 3 sets 10 feet 3 way     09/11/21 Bike Level 4 x 6 minutes Gait outside around the back building Side step on and off airex On airex ball toss On airex cone toe and hand touches Airex balance beam side stepping Side stepping over half foam roll Walking ball toss STS with OHP with yellow weighted ball 5# leg extension 2x10, then 2x5 with right only 25# leg curls 2x10 Leg press 20# 2x10, then right only x10  09/06/21 Bike L4 X72mns STS w/OHP yellow ball 2x10 Bridges 2x10 SLR 2# 2x10 Resisted gait 20# forwards and sideways x3 Shoulder ext 5# 2x10 Knee ext 15# x10, 10# x10, 5# x10 RLE Knee flexion 20# 2x10, 15# RLE Standing on airex catch, CGA  09/04/21 Bike L4 x634ms STS 2x10 Cybex ext 10# 2x10 Cybex flexion 20# 2x10 Step ups 6" x10 each side Step up on Airex x10 each side Walk on beam in // bars  2 way hip 2# 2x10 BLE   08/17/2021 Eval only   PATIENT EDUCATION: Education details: PT eval findings, anticipated POC, and recommendation for OT referral to address UE ataxia as primary concerns seem to be fine motor (pt has appt with neurologist tomorrow) Person educated: Patient and  Spouse Education method: Explanation and blank referral form provided to have MD complete for OT referral Education comprehension: verbalized understanding   HOME EXERCISE PROGRAM: TBD   ASSESSMENT:  Clinical impression:  Todays treatment focused on LE strength balance. Difficulty with activities on airex due to unsteadiness, requires minA. LOB due to poor foot clearance with step up. Does better with 2HHA in bars with balance. Able to tolerate increased weights today. Continue to progress as tolerated.     OBJECTIVE IMPAIRMENTS Abnormal gait, decreased activity tolerance, decreased balance, decreased cognition, decreased coordination, decreased endurance, decreased knowledge of condition, decreased knowledge of use of DME, decreased mobility, difficulty walking, decreased strength, decreased safety awareness, dizziness, impaired perceived functional ability, impaired UE functional use, improper body mechanics, postural dysfunction, and pain.   ACTIVITY LIMITATIONS carrying, lifting, bending, standing, transfers, reach over head, locomotion level, and writing  PARTICIPATION LIMITATIONS: meal prep, cleaning, laundry, driving, shopping, community activity, and yard work  PERSONAL FACTORS Age, Past/current experiences, Time since onset of injury/illness/exacerbation, and 3+ comorbidities: 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; memory loss; HTN; mild aortic stenosis; factor V Leiden; OA; back pain; HLD; h/o DVT and PE; recent fall (07/28/21) while on Eliquis - bruising to R shoulder and ribs  are also affecting patient's functional outcome.   REHAB POTENTIAL: Good  CLINICAL DECISION MAKING: Evolving/moderate complexity  EVALUATION COMPLEXITY: Moderate   GOALS: Goals reviewed with patient? Yes  SHORT TERM GOALS: Target date: 09/18/2021   Patient will be independent with initial HEP. Baseline:  Goal status: ongoing  2.  Patient will demonstrate decreased fall risk  by scoring < 15 sec on 5xSTS. Baseline: 19.59 sec Goal status:  ongoing  3.  Patient will be educated on strategies to decrease risk of falls.  Baseline:  Goal status: ongoing   LONG TERM GOALS: Target date: 10/16/2021   Patient will be independent with advanced/ongoing HEP to improve outcomes and carryover.  Baseline:  Goal status: INITIAL  2.  Patient will be able to ambulate 600' with LRAD with good safety to access community.  Baseline:  Goal status: INITIAL  3.  Patient will  be able to step up/down curb safely with LRAD for safety with community ambulation.  Baseline:  Goal status: INITIAL   4.  Patient will demonstrate improved B LE strength to >/= 4+/5 for improved stability and ease of mobility . Baseline: refer to above MMT chart Goal status: INITIAL  5.  Patient will demonstrate at least 25/30 on DGI to improve gait stability and reduce risk for falls. Baseline: 17/30 Goal status: INITIAL  6.  Patient will report 65% of self confidence (35% impairment) on ABC scale to demonstrate improved functional ability. Baseline: 45% of self confidence, 55% impairment Goal status: INITIAL   PLAN: PT FREQUENCY: 2x/week  PT DURATION: 8 weeks  PLANNED INTERVENTIONS: Therapeutic exercises, Therapeutic activity, Neuromuscular re-education, Balance training, Gait training, Patient/Family education, Self Care, Joint mobilization, Stair training, DME instructions, Cryotherapy, Moist heat, Taping, Ultrasound, Ionotophoresis '4mg'$ /ml Dexamethasone, Manual therapy, and Re-evaluation  PLAN FOR NEXT SESSION: Work on balance and strength. Issue HEP   Andris Baumann, [place] 09/18/2021, 12:11 PM [address] [place], [place], [address] Phone: [phone]   Fax:  [phone]  Patient Details  Name: Shawn [name] MRN: [id number] Date of Birth: 08/17/1934 Referring Provider:  Ann Held, *  Encounter  Date: 09/18/2021   Andris Baumann, PT 09/18/2021, 12:11 PM  [address] [place], [place], [address] Phone: [phone]   Fax:  [phone]

## 2021-09-20 ENCOUNTER — Ambulatory Visit: Payer: Medicare HMO

## 2021-09-22 ENCOUNTER — Ambulatory Visit: Payer: Medicare HMO | Admitting: Occupational Therapy

## 2021-09-22 ENCOUNTER — Ambulatory Visit: Payer: Medicare HMO | Admitting: Physical Therapy

## 2021-09-22 ENCOUNTER — Encounter: Payer: Self-pay | Admitting: Occupational Therapy

## 2021-09-22 DIAGNOSIS — R260 Ataxic gait: Secondary | ICD-10-CM | POA: Diagnosis not present

## 2021-09-22 DIAGNOSIS — R2681 Unsteadiness on feet: Secondary | ICD-10-CM

## 2021-09-22 DIAGNOSIS — R270 Ataxia, unspecified: Secondary | ICD-10-CM

## 2021-09-22 DIAGNOSIS — R262 Difficulty in walking, not elsewhere classified: Secondary | ICD-10-CM | POA: Diagnosis not present

## 2021-09-22 DIAGNOSIS — M6281 Muscle weakness (generalized): Secondary | ICD-10-CM

## 2021-09-22 NOTE — Therapy (Addendum)
OUTPATIENT PHYSICAL THERAPY NEURO TREATMENT   Patient Name: Shawn [name] MRN: [id number] DOB:Apr 01, 1934, 87 y.o., male Today's Date: 09/22/2021      PT End of Session - 09/22/21 1008     Visit Number 7    Date for PT Re-Evaluation 10/16/21    Authorization Type A[employer]na Medicare    PT Start Time 1008    PT Stop Time 1055    PT Time Calculation (min) 47 min                Past Medical History:  Diagnosis Date   Allergy    Cardiac murmur    mild aortic sclerosis   Cataract    left eye   Clotting disorder ([place])    Factor v Leiden factor mutation   Colon polyps    Hyperplastic   Diverticulosis    Dysphagia    Factor V Leiden mutation ([place])    Hemorrhoids    History of blood clots    Hyperlipidemia    Osteoarthritis    Past Surgical History:  Procedure Laterality Date   APPENDECTOMY     CATARACT EXTRACTION  02/09/2007   right eye   [place]     Patient Active Problem List   Diagnosis Date Noted   Ataxia of right upper extremity 08/09/2021   Otitis externa 07/07/2021   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 ([place]) 03/18/2020   High serum vitamin D 01/12/2020   Pain of left thumb 02/20/2016   Preventative health care 11/13/2013   History of colon polyps 11/02/2011   Bowel habit changes 11/02/2011   Other and unspecified coagulation defects 11/02/2011   Overweight(278.02) 10/05/2010   BACK PAIN 04/21/2010   COLONIC POLYPS, ADENOMATOUS, HX OF 03/22/2010   Factor V deficiency ([place]) 12/01/2009   THROMBOCYTOPENIA 12/01/2009   PULMONARY EMBOLISM, HX OF 12/01/2009   Pulmonary embolism ([place]) 11/20/2009   CERUMEN IMPACTION, RIGHT 07/29/2009   VARICOSE VEINS LOWER EXTREMITIES W/INFLAMMATION 12/01/2008   Acute thromboembolism of deep veins of lower extremity ([place]) 09/03/2008   Aortic valve disorder 11/19/2007   HEPATIC CYST 11/19/2007   CALF PAIN, LEFT  11/11/2007   MEMORY LOSS 08/28/2007   MOLE 08/11/2007   Hyperlipidemia 09/04/2006   OSTEOARTHRITIS 09/04/2006   DYSPHAGIA, UNSPECIFIED 09/04/2006    PCP: Ann Held, DO  REFERRING PROVIDER: Patrecia Pour, MD  REFERRING DIAG: R27.0 (ICD-10-CM) - Ataxia of right upper extremity  THERAPY DIAG:  Muscle weakness (generalized)  Unsteadiness on feet  Difficulty in walking, not elsewhere classified  RATIONALE FOR EVALUATION AND TREATMENT:  Rehabilitation  ONSET DATE: 08/09/2021 - TIA  NEXT MD VISIT: 08/22/21 with neurologist; 09/05/2021 with PCP   SUBJECTIVE:   SUBJECTIVE STATEMENT: Denies stumbles. Busy morning so far  PAIN:  Are you having pain? No  PERTINENT HISTORY:  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; memory loss; HTN; mild aortic stenosis; factor V Leiden; OA; back pain; HLD; h/o DVT and PE; recent fall (07/28/21) while on Eliquis - bruising to R shoulder and ribs  PRECAUTIONS: Fall  WEIGHT BEARING RESTRICTIONS: No  FALLS: Has patient fallen in last 6 months? Yes. Number of falls 1  LIVING ENVIRONMENT: Lives with: lives with their spouse Lives in: House/apartment Stairs: No Has following equipment at home: Single point cane, Environmental consultant - 4 wheeled, shower chair, and Grab bars  PLOF: Independent and Leisure: Reading, puzzles, building bird houses, walking  daily x 3/4 mile  PATIENT GOALS: "To get back to the way I was."   OBJECTIVE:   VITAL SIGNS:  BP:  126/72 Pulse: 74 O2 sats: 93%  DIAGNOSTIC FINDINGS:  08/10/21- MRI brain: 1. No acute intracranial abnormality.  2. Findings of chronic ischemic microangiopathy and generalized volume loss. 08/09/21 - CT head: There is no acute intracranial hemorrhage or evidence of acute infarction. ASPECT score is 10. Stable chronic/nonemergent findings. 08/09/21 - CT angio head & neck: 1. Negative for intracranial large vessel occlusion or significant stenosis.  2. No significant carotid or vertebral  artery stenosis in the neck. Mild atherosclerotic disease of the carotid bifurcation bilaterally.  COGNITION: Overall cognitive status: Impaired   SENSATION: WFL  COORDINATION: Heel to shin - impaired Finger to nose - WNL Finger- thumb opposition - WNL  POSTURE:  rounded shoulders, forward head, increased thoracic kyphosis, and flexed trunk    LOWER EXTREMITY MMT:   Tested in sitting  MMT Right Eval Left Eval  Hip flexion 4 4+  Hip extension 4+ 4+  Hip abduction 5 5  Hip adduction 4 4+  Hip internal rotation 4 4  Hip external rotation 4- 4  Knee flexion 5 5  Knee extension 5 5  Ankle dorsiflexion 4+ 5  Ankle plantarflexion    Ankle inversion    Ankle eversion    (Blank rows = not tested)  BED MOBILITY:  NT  TRANSFERS: Assistive device utilized: None  Sit to stand: Complete Independence Stand to sit: Modified independence Chair to chair: Modified independence Floor:  NT  GAIT: Distance walked: 150 Assistive device utilized: None Level of assistance: SBA Gait pattern: step through pattern and ataxic Comments: initially very unsteady reaching for walls and furniture but by end of visit gait pattern considerably better  STAIRS:  Level of Assistance: SBA  Stair Negotiation Technique: Alternating Pattern  with Single Rail on Right  Number of Stairs: 14   Height of Stairs: 7  Comments: more hesitant on descent with B LE ER  FUNCTIONAL TESTS:  5 times sit to stand: 19.59 sec; recurrent falls >15 sec Timed up and go (TUG): 12.00 sec 10 meter walk test: 10.94 sec; Gait speed = 3.0 ft/sec Functional gait assessment: 17/30; < 19 = high risk fall   PATIENT SURVEYS:  ABC scale = 45% of self confidence, 55% impairment   TODAY'S TREATMENT:   09/22/21 Bile L 4 6 min 5 times sit to stand: 19.59 sec; recurrent falls >15 sec.  TODAY 9.53 sec Timed up and go (TUG): 12.00 sec TODAY 10.71 sec Ball toss on airex CGA NO LOB Walking fwd and back with ball toss 20  feet each way SBA with cuing no LOB STS on airex with wt ball chest press 10x SBA Knee ext 15# 2x10, 10#x10 RLE HS curls 25# 2x10, 15#x10 RLE Leg press 20# 2x10 Lat pull and seated row 20# 2 sets 10 to work on posture to help keep shld and head back Black tband 2 sets 10 trunk ext Obstacle course navigating on and around obj CG A- min A with cuing several LOB requiring assistance to stab. Pt had trouble picking up feet to clear obj consistently and cuing to get closer when stepping on or over obj as he tends to start too far away making it unsafe     09/18/21 Bike L4 x62mns   Knee ext 15# 2x10, 10#x10 RLE HS curls 25# 2x10, 15#x10 RLE Leg press 20# 2x10  STS w/OHP on airex  2x10  Step up on airex Side steps on airex    09/15/21  Nustep L 5 6 min Resisted gait fwd 5 x, back ward 5 x, each side 3 x 20 #, CGA with cuing to increase step length High level Balance actvities on airex beams- marching fwd and back, tandem fwd and back and side stepping SBA Ladder step over step and lateral stepping - working to pick feet up and clear rung, cuing to hold head up STS with wt ball press 10 x chest press then 10x OH press on airex CGA Knee ext 10# 2 sets 10 with cuing for ful TKE. 5# Rt only 2 sets 5 HS curl 25 # 2 sets 12, RT only 10# 10 x Ball toss on airex SBA Ball toss and kicks alternating- trouble with quick reaction to kick esp with left leg Leg Press 20# 3 sets 10 feet 3 way     09/11/21 Bike Level 4 x 6 minutes Gait outside around the back building Side step on and off airex On airex ball toss On airex cone toe and hand touches Airex balance beam side stepping Side stepping over half foam roll Walking ball toss STS with OHP with yellow weighted ball 5# leg extension 2x10, then 2x5 with right only 25# leg curls 2x10 Leg press 20# 2x10, then right only x10  09/06/21 Bike L4 X36mns STS w/OHP yellow ball 2x10 Bridges 2x10 SLR 2# 2x10 Resisted gait 20# forwards and  sideways x3 Shoulder ext 5# 2x10 Knee ext 15# x10, 10# x10, 5# x10 RLE Knee flexion 20# 2x10, 15# RLE Standing on airex catch, CGA  09/04/21 Bike L4 x682ms STS 2x10 Cybex ext 10# 2x10 Cybex flexion 20# 2x10 Step ups 6" x10 each side Step up on Airex x10 each side Walk on beam in // bars  2 way hip 2# 2x10 BLE   08/17/2021 Eval only   PATIENT EDUCATION: Education details: PT eval findings, anticipated POC, and recommendation for OT referral to address UE ataxia as primary concerns seem to be fine motor (pt has appt with neurologist tomorrow) Person educated: Patient and Spouse Education method: Explanation and blank referral form provided to have MD complete for OT referral Education comprehension: verbalized understanding   HOME EXERCISE PROGRAM: TBD   ASSESSMENT:  Clinical impression:  Todays treatment focused on LE strength balance. Difficulty with activities on airex due to unsteadiness, requires minA. LOB due to poor foot clearance with step up. Does better with 2HHA in bars with balance. Able to tolerate increased weights today. Continue to progress as tolerated.     OBJECTIVE IMPAIRMENTS 2 of 3 STG met. Improved fucn scores as noted above. Noted increased balance today vs last time this PTA worked with pt. Pt does need cuing to hold head up as he is very fwd flexed- added some postural ex to help with this.   ACTIVITY LIMITATIONS carrying, lifting, bending, standing, transfers, reach over head, locomotion level, and writing  PARTICIPATION LIMITATIONS: meal prep, cleaning, laundry, driving, shopping, community activity, and yard work  PERSONAL FACTORS Age, Past/current experiences, Time since onset of injury/illness/exacerbation, and 3+ comorbidities: 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; memory loss; HTN; mild aortic stenosis; factor V Leiden; OA; back pain; HLD; h/o DVT and PE; recent fall (07/28/21) while on Eliquis - bruising to R shoulder and  ribs  are also affecting patient's functional outcome.   REHAB POTENTIAL: Good  CLINICAL DECISION MAKING: Evolving/moderate complexity  EVALUATION COMPLEXITY: Moderate   GOALS: Goals reviewed with  patient? Yes  SHORT TERM GOALS: Target date: 09/18/2021   Patient will be independent with initial HEP. Baseline:  Goal status: ongoing  2.  Patient will demonstrate decreased fall risk by scoring < 15 sec on 5xSTS. Baseline: 19.59 sec Goal status:  met  3.  Patient will be educated on strategies to decrease risk of falls.  Baseline:  Goal status: met  LONG TERM GOALS: Target date: 10/16/2021   Patient will be independent with advanced/ongoing HEP to improve outcomes and carryover.  Baseline:  Goal status: INITIAL  2.  Patient will be able to ambulate 600' with LRAD with good safety to access community.  Baseline:  Goal status: INITIAL  3.  Patient will be able to step up/down curb safely with LRAD for safety with community ambulation.  Baseline:  Goal status: INITIAL   4.  Patient will demonstrate improved B LE strength to >/= 4+/5 for improved stability and ease of mobility . Baseline: refer to above MMT chart Goal status: INITIAL  5.  Patient will demonstrate at least 25/30 on DGI to improve gait stability and reduce risk for falls. Baseline: 17/30 Goal status: INITIAL  6.  Patient will report 65% of self confidence (35% impairment) on ABC scale to demonstrate improved functional ability. Baseline: 45% of self confidence, 55% impairment Goal status: INITIAL   PLAN: PT FREQUENCY: 2x/week  PT DURATION: 8 weeks  PLANNED INTERVENTIONS: Therapeutic exercises, Therapeutic activity, Neuromuscular re-education, Balance training, Gait training, Patient/Family education, Self Care, Joint mobilization, Stair training, DME instructions, Cryotherapy, Moist heat, Taping, Ultrasound, Ionotophoresis 75m/ml Dexamethasone, Manual therapy, and Re-evaluation  PLAN FOR NEXT SESSION:  Work on balance and strength. Issue HEP   Shion Bluestein,ANGIE, PTA 09/22/2021, 10:09 AM [place] [address]Phone: [phone]  Fax:  [phone] Patient Details  Name: REstes LehnerMRN: [id number] of Birth: [birth year]Referring Provider:  LAnn Held *  Encounter Date: 09/22/2021   PLaqueta Carina PTA 09/22/2021, 10:09 AM  [place] [address]Phone: [phone]  Fax:  [address] [address]Phone: [phone]  Fax:  [phone]

## 2021-09-22 NOTE — Therapy (Signed)
OUTPATIENT OCCUPATIONAL THERAPY NEURO EVALUATION  Patient Name: Shawn [name] MRN: [id number] DOB:04-15-1934, 87 y.o., male Today's Date: 09/22/2021  PCP: Ann Held, DO  REFERRING PROVIDER: Ann Held, DO    OT End of Session - 09/22/21 1100     Visit Number 1    Number of Visits 1    Authorization Type [employer]    OT Start Time 1055    OT Stop Time 1142    OT Time Calculation (min) 47 min    Behavior During Therapy WFL for tasks assessed/performed            Past Medical History:  Diagnosis Date   Allergy    Cardiac murmur    mild aortic sclerosis   Cataract    left eye   Clotting disorder ([place])    Factor v Leiden factor mutation   Colon polyps    Hyperplastic   Diverticulosis    Dysphagia    Factor V Leiden mutation ([place])    Hemorrhoids    History of blood clots    Hyperlipidemia    Osteoarthritis    Past Surgical History:  Procedure Laterality Date   APPENDECTOMY     CATARACT EXTRACTION  02/09/2007   right eye   [place]     Patient Active Problem List   Diagnosis Date Noted   Ataxia of right upper extremity 08/09/2021   Otitis externa 07/07/2021   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 ([place]) 03/18/2020   High serum vitamin D 01/12/2020   Pain of left thumb 02/20/2016   Preventative health care 11/13/2013   History of colon polyps 11/02/2011   Bowel habit changes 11/02/2011   Other and unspecified coagulation defects 11/02/2011   Overweight(278.02) 10/05/2010   BACK PAIN 04/21/2010   COLONIC POLYPS, ADENOMATOUS, HX OF 03/22/2010   Factor V deficiency ([place]) 12/01/2009   THROMBOCYTOPENIA 12/01/2009   PULMONARY EMBOLISM, HX OF 12/01/2009   Pulmonary embolism ([place]) 11/20/2009   CERUMEN IMPACTION, RIGHT 07/29/2009   VARICOSE VEINS LOWER EXTREMITIES W/INFLAMMATION 12/01/2008   Acute thromboembolism of deep veins of  lower extremity ([place]) 09/03/2008   Aortic valve disorder 11/19/2007   HEPATIC CYST 11/19/2007   CALF PAIN, LEFT 11/11/2007   MEMORY LOSS 08/28/2007   MOLE 08/11/2007   Hyperlipidemia 09/04/2006   OSTEOARTHRITIS 09/04/2006   DYSPHAGIA, UNSPECIFIED 09/04/2006    ONSET DATE: 08/22/2021 (date of OT order)  REFERRING DIAG: R27.0 (ICD-10-CM) - Ataxia of right upper extremity   THERAPY DIAG:  Ataxia  Rationale for Evaluation and Treatment Rehabilitation  SUBJECTIVE:   SUBJECTIVE STATEMENT: Pt arrives to OP OT evaluation reporting no functional concerns and that his handwriting has improved in the past few weeks. States he fell down the stars while traveling to visit relatives and did not present to the ED, but did check in w/ his daughter who is a PA. Noticed pain and R > LUE weakness when he was lifting his suitcase into the overhead compartment on the plane when flying home. Presented to the ED about a week later w/ suspected TIA, though pt stated, "that's what the doctor said but I don't believe it." Pt accompanied by: self and significant other (wife)  PERTINENT HISTORY: RUE ataxia s/p fall 3 months ago; presented to ED x2 due to weakness and decr coordination w/ suspected TIA. PMH includes mild aortic stenosis, factor V Leiden  deficiency on Eliquis, OA, MRI brain in 2022 indicating chronic microvascular disease, chronic back pain, HLD, h/o DVT and PT. Neuropsych evaluation Jan 2022 indicated mild NCD w/ visuospatial deficits and executive impairment though "at the time he did not clearly meet the full criteria" for dementia.  PRECAUTIONS: Other: no driving  WEIGHT BEARING RESTRICTIONS No  PAIN: Are you having pain? No  FALLS: Has patient fallen in last 6 months? Yes. Number of falls 1  LIVING ENVIRONMENT: Lives with: lives with their spouse Lives in: House/apartment Stairs: No Has following equipment at home: Single point cane and Environmental consultant - 4 wheeled   PLOF:  Independent  PATIENT GOALS: "I don't know"  OBJECTIVE:   HAND DOMINANCE: Right  ADLs: Overall ADLs: Pt reports he is independent w/ all BADLs at this time; not completing any tasks differently than he was prior to onset Equipment: Grab bars by shower and toilet  IADLs: Light housekeeping: wife completes and was completing prior; does vacuum w/out difficulty Meal Prep: wife completes and was completing prior Community mobility: no driving; relies on family Medication management: wife managing medications Financial management: pt and wife manage together Handwriting: 90% legible  MOBILITY STATUS:  ambulated in/out of session w/out AD  UPPER EXTREMITY ROM BUE WFL per gross assessment (shoulder, elbow, forearm, wrist, and hand)  UPPER EXTREMITY MMT:    WFL for tasks performed  HAND FUNCTION: Grip strength: Right: 65 lbs; Left: 59 lbs  COORDINATION: Finger Nose Finger test: [facility] 9 Hole Peg test: Right: 29.19 sec; Left: 32.79 sec Box and Blocks:  Right 47 blocks, Left 46 blocks  SENSATION: WFL  COGNITION: Overall cognitive status:  reports some difficulty w/ memory; observed mild deficits w/ recall, error recognition/problem-solving, and processing speed Double number cancellation: 100% accuracy Trail Making: completed Part A w/out difficulty; required repetition of instruction for understanding of Part B, completing in 3 min, 50 sec w/ 1 error w/out recognition/self-correction  VISION: Subjective report: no concerns; denies double vision, blurred vision, difficulty reading Baseline vision:  Progressive lenses Visual history: cataracts; h/o corrective surgery  VISION ASSESSMENT: Tracking/Visual pursuits: Able to track stimulus in all quads without difficulty Saccades: decreased speed of saccadic movements Convergence: WFL Visual Fields: no apparent deficits  OBSERVATIONS: Involuntary abdominal and body movements observed intermittently during session; per chart review  this has been present since 2022; did not impact pt functionally during evaluation  TODAY'S TREATMENT:   None   PATIENT EDUCATION: Educated on role and purpose of OT as well as interpretation of initial evaluation findings. Also provided education on likely benefit of SLP referral to address cognitive deficits; pt and spouse verbalized understanding. Person educated: Patient and Spouse Education method: Explanation Education comprehension: verbalized understanding   HOME EXERCISE PROGRAM: N/A   GOALS: Goals reviewed with patient? Yes  SHORT TERM GOALS: Target date: 09/22/21  Pt will verbalize understanding of role/purpose of occupational therapy services and be able to independently identify if/when he may need to seek an add'l referral for services to assist w/ participation in functional activities  Goal status: MET   ASSESSMENT:  CLINICAL IMPRESSION: Pt is an 87 y/o who presents to OP OT due to RUE ataxia s/p fall in June 2023; presented to ED x2 due to weakness and decr coordination w/ suspected TIA. PMH includes mild aortic stenosis, factor V Leiden deficiency on Eliquis, OA, MRI brain in 2022 indicating chronic microvascular disease, chronic back pain, HLD, h/o DVT and PT. Additionally of note, neuropsych evaluation completed in Jan 2022 indicating  mild NCD w/ visuospatial deficits and executive impairment though "at the time he did not clearly meet the full criteria" for dementia. Pt lives with his wife and does not report any functional concerns at this time aside from some concerns w/ balance, which is currently being addressed in physical therapy. Evaluation indicated no limitations w/ GMC, FMC, strength, vision/visual perception, or participation in functional daily activities. Mild involuntary movements observed w/ RUE, though chart review indicates this was present prior to recent fall and hospital visits, and mild cognitive deficits also noted during evaluation w/ OT  discussing benefit of eventual SLP referral if concerns worsen or become limiting to engagement in functional tasks. Due to absence of functional deficits aside from imbalance being addressed in PT, skilled occupational therapy services are not warranted at this time. OT discussed this w/ pt and he was receptive. Pt encouraged to call back with any specific changes or concerns w/ functional activities.?   PERFORMANCE DEFICITS in functional skills including strength, GMC, and balance, cognitive skills including memory and problem solving.   IMPAIRMENTS are limiting patient from  safety w/ functional mobility  COMORBIDITIES may have co-morbidities  that affects occupational performance. Patient will benefit from skilled OT to address above impairments and improve overall function.  MODIFICATION OR ASSISTANCE TO COMPLETE EVALUATION: No modification of tasks or assist necessary to complete an evaluation.  OT OCCUPATIONAL PROFILE AND HISTORY: Problem focused assessment: Including review of records relating to presenting problem.  CLINICAL DECISION MAKING: LOW - limited treatment options, no task modification necessary  REHAB POTENTIAL: N/A  EVALUATION COMPLEXITY: Low   PLAN: OT FREQUENCY: one time visit  PLANNED INTERVENTIONS: patient/family education  RECOMMENDED OTHER SERVICES: Currently receiving PT services at this location  CONSULTED AND AGREED WITH PLAN OF CARE: Patient and family member/caregiver  PLAN FOR NEXT SESSION: N/A   Kathrine Cords, MSOT, OTR/L 09/22/2021, 12:19 PM

## 2021-09-25 ENCOUNTER — Ambulatory Visit: Payer: Medicare HMO | Admitting: Physical Therapy

## 2021-09-25 ENCOUNTER — Encounter: Payer: Self-pay | Admitting: Physical Therapy

## 2021-09-25 DIAGNOSIS — R270 Ataxia, unspecified: Secondary | ICD-10-CM | POA: Diagnosis not present

## 2021-09-25 DIAGNOSIS — M6281 Muscle weakness (generalized): Secondary | ICD-10-CM

## 2021-09-25 DIAGNOSIS — R260 Ataxic gait: Secondary | ICD-10-CM | POA: Diagnosis not present

## 2021-09-25 DIAGNOSIS — R262 Difficulty in walking, not elsewhere classified: Secondary | ICD-10-CM | POA: Diagnosis not present

## 2021-09-25 DIAGNOSIS — R2681 Unsteadiness on feet: Secondary | ICD-10-CM | POA: Diagnosis not present

## 2021-09-25 NOTE — Therapy (Signed)
OUTPATIENT PHYSICAL THERAPY NEURO TREATMENT   Patient Name: Shawn [name] MRN: [id number] DOB:October 13, 1934, 87 y.o., male Today's Date: 09/25/2021      PT End of Session - 09/25/21 1053     Visit Number 8    Date for PT Re-Evaluation 10/16/21    Authorization Type Aetna Medicare    PT Start Time 1052    PT Stop Time 1141    PT Time Calculation (min) 49 min    Activity Tolerance Patient tolerated treatment well    Behavior During Therapy WFL for tasks assessed/performed                Past Medical History:  Diagnosis Date   Allergy    Cardiac murmur    mild aortic sclerosis   Cataract    left eye   Clotting disorder ([place])    Factor v Leiden factor mutation   Colon polyps    Hyperplastic   Diverticulosis    Dysphagia    Factor V Leiden mutation ([place])    Hemorrhoids    History of blood clots    Hyperlipidemia    Osteoarthritis    Past Surgical History:  Procedure Laterality Date   APPENDECTOMY     CATARACT EXTRACTION  02/09/2007   right eye   [place]     Patient Active Problem List   Diagnosis Date Noted   Ataxia of right upper extremity 08/09/2021   Otitis externa 07/07/2021   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 ([place]) 03/18/2020   High serum vitamin D 01/12/2020   Pain of left thumb 02/20/2016   Preventative health care 11/13/2013   History of colon polyps 11/02/2011   Bowel habit changes 11/02/2011   Other and unspecified coagulation defects 11/02/2011   Overweight(278.02) 10/05/2010   BACK PAIN 04/21/2010   COLONIC POLYPS, ADENOMATOUS, HX OF 03/22/2010   Factor V deficiency ([place]) 12/01/2009   THROMBOCYTOPENIA 12/01/2009   PULMONARY EMBOLISM, HX OF 12/01/2009   Pulmonary embolism ([place]) 11/20/2009   CERUMEN IMPACTION, RIGHT 07/29/2009   VARICOSE VEINS LOWER EXTREMITIES W/INFLAMMATION 12/01/2008   Acute thromboembolism of deep veins of  lower extremity ([place]) 09/03/2008   Aortic valve disorder 11/19/2007   HEPATIC CYST 11/19/2007   CALF PAIN, LEFT 11/11/2007   MEMORY LOSS 08/28/2007   MOLE 08/11/2007   Hyperlipidemia 09/04/2006   OSTEOARTHRITIS 09/04/2006   DYSPHAGIA, UNSPECIFIED 09/04/2006    PCP: Ann Held, DO  REFERRING PROVIDER: Patrecia Pour, MD  REFERRING DIAG: R27.0 (ICD-10-CM) - Ataxia of right upper extremity  THERAPY DIAG:  Ataxia  Muscle weakness (generalized)  Unsteadiness on feet  Difficulty in walking, not elsewhere classified  Ataxic gait  RATIONALE FOR EVALUATION AND TREATMENT:  Rehabilitation  ONSET DATE: 08/09/2021 - TIA  NEXT MD VISIT: 08/22/21 with neurologist; 09/05/2021 with PCP   SUBJECTIVE:   SUBJECTIVE STATEMENT: I am doing well, no falls, feeling a little more stable  PAIN:  Are you having pain? No  PERTINENT HISTORY:  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; memory loss; HTN; mild aortic stenosis; factor V Leiden; OA; back pain; HLD; h/o DVT and PE; recent fall (07/28/21) while on Eliquis - bruising to R shoulder and ribs  PRECAUTIONS: Fall  WEIGHT BEARING RESTRICTIONS: No  FALLS: Has patient fallen in last 6 months? Yes. Number of falls 1  LIVING ENVIRONMENT: Lives with: lives with their spouse Lives in: House/apartment Stairs:  No Has following equipment at home: Single point cane, Walker - 4 wheeled, shower chair, and Grab bars  PLOF: Independent and Leisure: Reading, puzzles, building bird houses, walking daily x 3/4 mile  PATIENT GOALS: "To get back to the way I was."   OBJECTIVE:   VITAL SIGNS:  BP:  126/72 Pulse: 74 O2 sats: 93%  DIAGNOSTIC FINDINGS:  08/10/21- MRI brain: 1. No acute intracranial abnormality.  2. Findings of chronic ischemic microangiopathy and generalized volume loss. 08/09/21 - CT head: There is no acute intracranial hemorrhage or evidence of acute infarction. ASPECT score is 10. Stable chronic/nonemergent  findings. 08/09/21 - CT angio head & neck: 1. Negative for intracranial large vessel occlusion or significant stenosis.  2. No significant carotid or vertebral artery stenosis in the neck. Mild atherosclerotic disease of the carotid bifurcation bilaterally.  COGNITION: Overall cognitive status: Impaired   SENSATION: WFL  COORDINATION: Heel to shin - impaired Finger to nose - WNL Finger- thumb opposition - WNL  POSTURE:  rounded shoulders, forward head, increased thoracic kyphosis, and flexed trunk    LOWER EXTREMITY MMT:   Tested in sitting  MMT Right Eval Left Eval  Hip flexion 4 4+  Hip extension 4+ 4+  Hip abduction 5 5  Hip adduction 4 4+  Hip internal rotation 4 4  Hip external rotation 4- 4  Knee flexion 5 5  Knee extension 5 5  Ankle dorsiflexion 4+ 5  Ankle plantarflexion    Ankle inversion    Ankle eversion    (Blank rows = not tested)  BED MOBILITY:  NT  TRANSFERS: Assistive device utilized: None  Sit to stand: Complete Independence Stand to sit: Modified independence Chair to chair: Modified independence Floor:  NT  GAIT: Distance walked: 150 Assistive device utilized: None Level of assistance: SBA Gait pattern: step through pattern and ataxic Comments: initially very unsteady reaching for walls and furniture but by end of visit gait pattern considerably better  STAIRS:  Level of Assistance: SBA  Stair Negotiation Technique: Alternating Pattern  with Single Rail on Right  Number of Stairs: 14   Height of Stairs: 7  Comments: more hesitant on descent with B LE ER  FUNCTIONAL TESTS:  5 times sit to stand: 19.59 sec; recurrent falls >15 sec Timed up and go (TUG): 12.00 sec 10 meter walk test: 10.94 sec; Gait speed = 3.0 ft/sec Functional gait assessment: 17/30; < 19 = high risk fall   PATIENT SURVEYS:  ABC scale = 45% of self confidence, 55% impairment   TODAY'S TREATMENT:  09/25/21 NuStep level 5 x 6 minutes Gait around building outside   Leg extension 10# 3x10 cues to get TKE Leg curls 25# 3x20 On airex cone toe touches, hand cone touches Leg press 20# x10, 40# x10 Walking ball toss Direction changes Volleyball On airex ball toss On airex eyes closed On Airex head turns Stepping over obstacles, side stepping over objects, side stepping on and off airex   09/22/21 Bile L 4 6 min 5 times sit to stand: 19.59 sec; recurrent falls >15 sec.  TODAY 9.53 sec Timed up and go (TUG): 12.00 sec TODAY 10.71 sec Ball toss on airex CGA NO LOB Walking fwd and back with ball toss 20 feet each way SBA with cuing no LOB STS on airex with wt ball chest press 10x SBA Knee ext 15# 2x10, 10#x10 RLE HS curls 25# 2x10, 15#x10 RLE Leg press 20# 2x10 Lat pull and seated row 20# 2 sets 10 to  work on posture to help keep shld and head back Black tband 2 sets 10 trunk ext Obstacle course navigating on and around obj CG A- min A with cuing several LOB requiring assistance to stab. Pt had trouble picking up feet to clear obj consistently and cuing to get closer when stepping on or over obj as he tends to start too far away making it unsafe   09/18/21 Bike L4 x65mns   Knee ext 15# 2x10, 10#x10 RLE HS curls 25# 2x10, 15#x10 RLE Leg press 20# 2x10  STS w/OHP on airex 2x10  Step up on airex Side steps on airex    09/15/21  Nustep L 5 6 min Resisted gait fwd 5 x, back ward 5 x, each side 3 x 20 #, CGA with cuing to increase step length High level Balance actvities on airex beams- marching fwd and back, tandem fwd and back and side stepping SBA Ladder step over step and lateral stepping - working to pick feet up and clear rung, cuing to hold head up STS with wt ball press 10 x chest press then 10x OH press on airex CGA Knee ext 10# 2 sets 10 with cuing for ful TKE. 5# Rt only 2 sets 5 HS curl 25 # 2 sets 12, RT only 10# 10 x Ball toss on airex SBA Ball toss and kicks alternating- trouble with quick reaction to kick esp with left leg Leg  Press 20# 3 sets 10 feet 3 way  PATIENT EDUCATION: Education details: PT eval findings, anticipated POC, and recommendation for OT referral to address UE ataxia as primary concerns seem to be fine motor (pt has appt with neurologist tomorrow) Person educated: Patient and Spouse Education method: Explanation and blank referral form provided to have MD complete for OT referral Education comprehension: verbalized understanding   HOME EXERCISE PROGRAM: TBD   ASSESSMENT:  Clinical impression:  Patient did well today, biggest issue is the higher level balance activities like walking ball toss and the dynamic surfaces.  At times he does not pick up his feet to clear obstacles and knees cues on the knee extension to get TKE  OBJECTIVE IMPAIRMENTS 2 of 3 STG met. Improved fucn scores as noted above. Noted increased balance today vs last time this PTA worked with pt. Pt does need cuing to hold head up as he is very fwd flexed- added some postural ex to help with this.   ACTIVITY LIMITATIONS carrying, lifting, bending, standing, transfers, reach over head, locomotion level, and writing  PARTICIPATION LIMITATIONS: meal prep, cleaning, laundry, driving, shopping, community activity, and yard work  PERSONAL FACTORS Age, Past/current experiences, Time since onset of injury/illness/exacerbation, and 3+ comorbidities: 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; memory loss; HTN; mild aortic stenosis; factor V Leiden; OA; back pain; HLD; h/o DVT and PE; recent fall (07/28/21) while on Eliquis - bruising to R shoulder and ribs  are also affecting patient's functional outcome.   REHAB POTENTIAL: Good  CLINICAL DECISION MAKING: Evolving/moderate complexity  EVALUATION COMPLEXITY: Moderate   GOALS: Goals reviewed with patient? Yes  SHORT TERM GOALS: Target date: 09/18/2021   Patient will be independent with initial HEP. Baseline:  Goal status: met  2.  Patient will demonstrate  decreased fall risk by scoring < 15 sec on 5xSTS. Baseline: 19.59 sec Goal status:  met  3.  Patient will be educated on strategies to decrease risk of falls.  Baseline:  Goal status: met  LONG TERM GOALS: Target date: 10/16/2021   Patient will  be independent with advanced/ongoing HEP to improve outcomes and carryover.  Baseline:  Goal status:ongoing  2.  Patient will be able to ambulate 600' with LRAD with good safety to access community.  Baseline:  Goal status:  met  3.  Patient will be able to step up/down curb safely with LRAD for safety with community ambulation.  Baseline:  Goal status:partially net  4.  Patient will demonstrate improved B LE strength to >/= 4+/5 for improved stability and ease of mobility . Baseline: refer to above MMT chart Goal status: INITIAL  5.  Patient will demonstrate at least 25/30 on DGI to improve gait stability and reduce risk for falls. Baseline: 17/30 Goal status: INITIAL  6.  Patient will report 65% of self confidence (35% impairment) on ABC scale to demonstrate improved functional ability. Baseline: 45% of self confidence, 55% impairment Goal status: INITIAL   PLAN: PT FREQUENCY: 2x/week  PT DURATION: 8 weeks  PLANNED INTERVENTIONS: Therapeutic exercises, Therapeutic activity, Neuromuscular re-education, Balance training, Gait training, Patient/Family education, Self Care, Joint mobilization, Stair training, DME instructions, Cryotherapy, Moist heat, Taping, Ultrasound, Ionotophoresis 34m/ml Dexamethasone, Manual therapy, and Re-evaluation  PLAN FOR NEXT SESSION: Work on balance and strength. Issue HEP   ASumner Boast PT 09/25/2021, 10:54 AM [place] [address]Phone: [phone]  Fax:  [phone]

## 2021-09-27 ENCOUNTER — Ambulatory Visit: Payer: Medicare HMO

## 2021-09-28 NOTE — Therapy (Signed)
OUTPATIENT PHYSICAL THERAPY NEURO TREATMENT   Patient Name: Shawn [name] MRN: [id number] DOB:10-Jul-1934, 87 y.o., male Today's Date: 09/29/2021      PT End of Session - 09/29/21 1059     Visit Number 9    Date for PT Re-Evaluation 10/16/21    Authorization Type [employer] Medicare    PT Start Time 1055    PT Stop Time 1141    PT Time Calculation (min) 46 min    Activity Tolerance Patient tolerated treatment well    Behavior During Therapy WFL for tasks assessed/performed                 Past Medical History:  Diagnosis Date   Allergy    Cardiac murmur    mild aortic sclerosis   Cataract    left eye   Clotting disorder ([place])    Factor v Leiden factor mutation   Colon polyps    Hyperplastic   Diverticulosis    Dysphagia    Factor V Leiden mutation ([place])    Hemorrhoids    History of blood clots    Hyperlipidemia    Osteoarthritis    Past Surgical History:  Procedure Laterality Date   APPENDECTOMY     CATARACT EXTRACTION  02/09/2007   right eye   [place]     Patient Active Problem List   Diagnosis Date Noted   Ataxia of right upper extremity 08/09/2021   Otitis externa 07/07/2021   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 ([place]) 03/18/2020   High serum vitamin D 01/12/2020   Pain of left thumb 02/20/2016   Preventative health care 11/13/2013   History of colon polyps 11/02/2011   Bowel habit changes 11/02/2011   Other and unspecified coagulation defects 11/02/2011   Overweight(278.02) 10/05/2010   BACK PAIN 04/21/2010   COLONIC POLYPS, ADENOMATOUS, HX OF 03/22/2010   Factor V deficiency ([place]) 12/01/2009   THROMBOCYTOPENIA 12/01/2009   PULMONARY EMBOLISM, HX OF 12/01/2009   Pulmonary embolism ([place]) 11/20/2009   CERUMEN IMPACTION, RIGHT 07/29/2009   VARICOSE VEINS LOWER EXTREMITIES W/INFLAMMATION 12/01/2008   Acute thromboembolism of deep veins of  lower extremity ([place]) 09/03/2008   Aortic valve disorder 11/19/2007   HEPATIC CYST 11/19/2007   CALF PAIN, LEFT 11/11/2007   MEMORY LOSS 08/28/2007   MOLE 08/11/2007   Hyperlipidemia 09/04/2006   OSTEOARTHRITIS 09/04/2006   DYSPHAGIA, UNSPECIFIED 09/04/2006    PCP: Ann Held, DO  REFERRING PROVIDER: Patrecia Pour, MD  REFERRING DIAG: R27.0 (ICD-10-CM) - Ataxia of right upper extremity  THERAPY DIAG:  Ataxia  Muscle weakness (generalized)  Unsteadiness on feet  Difficulty in walking, not elsewhere classified  Ataxic gait  RATIONALE FOR EVALUATION AND TREATMENT:  Rehabilitation  ONSET DATE: 08/09/2021 - TIA  NEXT MD VISIT: 08/22/21 with neurologist; 09/05/2021 with PCP   SUBJECTIVE:   SUBJECTIVE STATEMENT: I am doing good, no falls.   PAIN:  Are you having pain? No  PERTINENT HISTORY:  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; memory loss; HTN; mild aortic stenosis; factor V Leiden; OA; back pain; HLD; h/o DVT and PE; recent fall (07/28/21) while on Eliquis - bruising to R shoulder and ribs  PRECAUTIONS: Fall  WEIGHT BEARING RESTRICTIONS: No  FALLS: Has patient fallen in last 6 months? Yes. Number of falls 1  LIVING ENVIRONMENT: Lives with: lives with their spouse Lives in: House/apartment Stairs: No Has following  equipment at home: Single point cane, Walker - 4 wheeled, shower chair, and Grab bars  PLOF: Independent and Leisure: Reading, puzzles, building bird houses, walking daily x 3/4 mile  PATIENT GOALS: "To get back to the way I was."   OBJECTIVE:   VITAL SIGNS:  BP:  126/72 Pulse: 74 O2 sats: 93%  DIAGNOSTIC FINDINGS:  08/10/21- MRI brain: 1. No acute intracranial abnormality.  2. Findings of chronic ischemic microangiopathy and generalized volume loss. 08/09/21 - CT head: There is no acute intracranial hemorrhage or evidence of acute infarction. ASPECT score is 10. Stable chronic/nonemergent findings. 08/09/21 - CT angio  head & neck: 1. Negative for intracranial large vessel occlusion or significant stenosis.  2. No significant carotid or vertebral artery stenosis in the neck. Mild atherosclerotic disease of the carotid bifurcation bilaterally.  COGNITION: Overall cognitive status: Impaired   SENSATION: WFL  COORDINATION: Heel to shin - impaired Finger to nose - WNL Finger- thumb opposition - WNL  POSTURE:  rounded shoulders, forward head, increased thoracic kyphosis, and flexed trunk    LOWER EXTREMITY MMT:   Tested in sitting  MMT Right Eval Left Eval  Hip flexion 4 4+  Hip extension 4+ 4+  Hip abduction 5 5  Hip adduction 4 4+  Hip internal rotation 4 4  Hip external rotation 4- 4  Knee flexion 5 5  Knee extension 5 5  Ankle dorsiflexion 4+ 5  Ankle plantarflexion    Ankle inversion    Ankle eversion    (Blank rows = not tested)  BED MOBILITY:  NT  TRANSFERS: Assistive device utilized: None  Sit to stand: Complete Independence Stand to sit: Modified independence Chair to chair: Modified independence Floor:  NT  GAIT: Distance walked: 150 Assistive device utilized: None Level of assistance: SBA Gait pattern: step through pattern and ataxic Comments: initially very unsteady reaching for walls and furniture but by end of visit gait pattern considerably better  STAIRS:  Level of Assistance: SBA  Stair Negotiation Technique: Alternating Pattern  with Single Rail on Right  Number of Stairs: 14   Height of Stairs: 7  Comments: more hesitant on descent with B LE ER  FUNCTIONAL TESTS:  5 times sit to stand: 19.59 sec; recurrent falls >15 sec Timed up and go (TUG): 12.00 sec 10 meter walk test: 10.94 sec; Gait speed = 3.0 ft/sec Functional gait assessment: 17/30; < 19 = high risk fall   PATIENT SURVEYS:  ABC scale = 45% of self confidence, 55% impairment   TODAY'S TREATMENT:  09/29/21 Nustep L5 x36mns  Lat pull down and rows 20# 2x10  Calf raises black bar 2x12   Horizontal ABD redTB 2x10  Leg ext 15# 3x10 HS curls 25# 3x10 Leg press 40# 3x10 Marching on airex  Side steps on airex Yellow ball toss against wall standing on airex   09/25/21 NuStep level 5 x 6 minutes Gait around building outside  Leg extension 10# 3x10 cues to get TKE Leg curls 25# 3x20 On airex cone toe touches, hand cone touches Leg press 20# x10, 40# x10 Walking ball toss Direction changes Volleyball On airex ball toss On airex eyes closed On Airex head turns Stepping over obstacles, side stepping over objects, side stepping on and off airex   09/22/21 Bile L 4 6 min 5 times sit to stand: 19.59 sec; recurrent falls >15 sec.  TODAY 9.53 sec Timed up and go (TUG): 12.00 sec TODAY 10.71 sec Ball toss on airex CGA NO LOB Walking  fwd and back with ball toss 20 feet each way SBA with cuing no LOB STS on airex with wt ball chest press 10x SBA Knee ext 15# 2x10, 10#x10 RLE HS curls 25# 2x10, 15#x10 RLE Leg press 20# 2x10 Lat pull and seated row 20# 2 sets 10 to work on posture to help keep shld and head back Black tband 2 sets 10 trunk ext Obstacle course navigating on and around obj CG A- min A with cuing several LOB requiring assistance to stab. Pt had trouble picking up feet to clear obj consistently and cuing to get closer when stepping on or over obj as he tends to start too far away making it unsafe   09/18/21 Bike L4 x37mns  Knee ext 15# 2x10, 10#x10 RLE HS curls 25# 2x10, 15#x10 RLE Leg press 20# 2x10 STS w/OHP on airex 2x10 Step up on airex Side steps on airex    09/15/21  Nustep L 5 6 min Resisted gait fwd 5 x, back ward 5 x, each side 3 x 20 #, CGA with cuing to increase step length High level Balance actvities on airex beams- marching fwd and back, tandem fwd and back and side stepping SBA Ladder step over step and lateral stepping - working to pick feet up and clear rung, cuing to hold head up STS with wt ball press 10 x chest press then 10x OH  press on airex CGA Knee ext 10# 2 sets 10 with cuing for ful TKE. 5# Rt only 2 sets 5 HS curl 25 # 2 sets 12, RT only 10# 10 x Ball toss on airex SBA Ball toss and kicks alternating- trouble with quick reaction to kick esp with left leg Leg Press 20# 3 sets 10 feet 3 way  PATIENT EDUCATION: Education details: PT eval findings, anticipated POC, and recommendation for OT referral to address UE ataxia as primary concerns seem to be fine motor (pt has appt with neurologist tomorrow) Person educated: Patient and Spouse Education method: Explanation and blank referral form provided to have MD complete for OT referral Education comprehension: verbalized understanding   HOME EXERCISE PROGRAM: TBD   ASSESSMENT:  Clinical impression:   Patient expressed in previous visits that he would like to work on his posture. We worked on some mid back strengthening today. Also continued with LE strengthening and balance. Some unsteadiness with activities on Airex, requires CGA to prevent falling. States his right leg still feels weaker. Added a few more visits to work on strength and balance to achieve remaining goals before discharge.   OBJECTIVE IMPAIRMENTS 2 of 3 STG met. Improved fucn scores as noted above. Noted increased balance today vs last time this PTA worked with pt. Pt does need cuing to hold head up as he is very fwd flexed- added some postural ex to help with this.   ACTIVITY LIMITATIONS carrying, lifting, bending, standing, transfers, reach over head, locomotion level, and writing  PARTICIPATION LIMITATIONS: meal prep, cleaning, laundry, driving, shopping, community activity, and yard work  PERSONAL FACTORS Age, Past/current experiences, Time since onset of injury/illness/exacerbation, and 3+ comorbidities: 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; memory loss; HTN; mild aortic stenosis; factor V Leiden; OA; back pain; HLD; h/o DVT and PE; recent fall (07/28/21) while on  Eliquis - bruising to R shoulder and ribs  are also affecting patient's functional outcome.   REHAB POTENTIAL: Good  CLINICAL DECISION MAKING: Evolving/moderate complexity  EVALUATION COMPLEXITY: Moderate   GOALS: Goals reviewed with patient? Yes  SHORT TERM GOALS:  Target date: 09/18/2021   Patient will be independent with initial HEP. Baseline:  Goal status: met  2.  Patient will demonstrate decreased fall risk by scoring < 15 sec on 5xSTS. Baseline: 19.59 sec Goal status:  met  3.  Patient will be educated on strategies to decrease risk of falls.  Baseline:  Goal status: met  LONG TERM GOALS: Target date: 10/16/2021   Patient will be independent with advanced/ongoing HEP to improve outcomes and carryover.  Baseline:  Goal status:ongoing  2.  Patient will be able to ambulate 600' with LRAD with good safety to access community.  Baseline:  Goal status:  met  3.  Patient will be able to step up/down curb safely with LRAD for safety with community ambulation.  Baseline:  Goal status:partially net  4.  Patient will demonstrate improved B LE strength to >/= 4+/5 for improved stability and ease of mobility . Baseline: refer to above MMT chart Goal status: ongoing  5.  Patient will demonstrate at least 25/30 on DGI to improve gait stability and reduce risk for falls. Baseline: 17/30 Goal status: INITIAL  6.  Patient will report 65% of self confidence (35% impairment) on ABC scale to demonstrate improved functional ability. Baseline: 45% of self confidence, 55% impairment Goal status: INITIAL   PLAN: PT FREQUENCY: 2x/week  PT DURATION: 8 weeks  PLANNED INTERVENTIONS: Therapeutic exercises, Therapeutic activity, Neuromuscular re-education, Balance training, Gait training, Patient/Family education, Self Care, Joint mobilization, Stair training, DME instructions, Cryotherapy, Moist heat, Taping, Ultrasound, Ionotophoresis 42m/ml Dexamethasone, Manual therapy, and  Re-evaluation  PLAN FOR NEXT SESSION: Work on balance and strength, progress note-reassess DGI and ABC scale, MMT   MAndris Baumann PT 09/29/2021, 11:42 AM [place] [id number]: [phone]  Fax:  [phone]

## 2021-09-29 ENCOUNTER — Ambulatory Visit: Payer: Medicare HMO

## 2021-09-29 ENCOUNTER — Ambulatory Visit: Payer: Medicare HMO | Admitting: Occupational Therapy

## 2021-09-29 DIAGNOSIS — R2681 Unsteadiness on feet: Secondary | ICD-10-CM | POA: Diagnosis not present

## 2021-09-29 DIAGNOSIS — M6281 Muscle weakness (generalized): Secondary | ICD-10-CM

## 2021-09-29 DIAGNOSIS — R260 Ataxic gait: Secondary | ICD-10-CM

## 2021-09-29 DIAGNOSIS — R262 Difficulty in walking, not elsewhere classified: Secondary | ICD-10-CM | POA: Diagnosis not present

## 2021-09-29 DIAGNOSIS — R270 Ataxia, unspecified: Secondary | ICD-10-CM | POA: Diagnosis not present

## 2021-10-02 ENCOUNTER — Encounter: Payer: Self-pay | Admitting: Physical Therapy

## 2021-10-02 ENCOUNTER — Ambulatory Visit: Payer: Medicare HMO | Admitting: Physical Therapy

## 2021-10-02 DIAGNOSIS — R262 Difficulty in walking, not elsewhere classified: Secondary | ICD-10-CM | POA: Diagnosis not present

## 2021-10-02 DIAGNOSIS — R2681 Unsteadiness on feet: Secondary | ICD-10-CM

## 2021-10-02 DIAGNOSIS — R260 Ataxic gait: Secondary | ICD-10-CM | POA: Diagnosis not present

## 2021-10-02 DIAGNOSIS — M6281 Muscle weakness (generalized): Secondary | ICD-10-CM

## 2021-10-02 DIAGNOSIS — R270 Ataxia, unspecified: Secondary | ICD-10-CM

## 2021-10-02 NOTE — Therapy (Signed)
OUTPATIENT PHYSICAL THERAPY NEURO TREATMENT   Patient Name: Shawn [name] MRN: [id number] DOB:February 10, 1934, 87 y.o., male Today's Date: 10/02/2021      PT End of Session - 10/02/21 1023     Visit Number 10    Date for PT Re-Evaluation 10/16/21    Authorization Type [employer] Medicare    PT Start Time 5537    PT Stop Time 1056    PT Time Calculation (min) 40 min    Activity Tolerance Patient tolerated treatment well    Behavior During Therapy WFL for tasks assessed/performed                  Past Medical History:  Diagnosis Date   Allergy    Cardiac murmur    mild aortic sclerosis   Cataract    left eye   Clotting disorder ([place])    Factor v Leiden factor mutation   Colon polyps    Hyperplastic   Diverticulosis    Dysphagia    Factor V Leiden mutation ([place])    Hemorrhoids    History of blood clots    Hyperlipidemia    Osteoarthritis    Past Surgical History:  Procedure Laterality Date   APPENDECTOMY     CATARACT EXTRACTION  02/09/2007   right eye   [name]     Patient Active Problem List   Diagnosis Date Noted   Ataxia of right upper extremity 08/09/2021   Otitis externa 07/07/2021   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 ([place]) 03/18/2020   High serum vitamin D 01/12/2020   Pain of left thumb 02/20/2016   Preventative health care 11/13/2013   History of colon polyps 11/02/2011   Bowel habit changes 11/02/2011   Other and unspecified coagulation defects 11/02/2011   Overweight(278.02) 10/05/2010   BACK PAIN 04/21/2010   COLONIC POLYPS, ADENOMATOUS, HX OF 03/22/2010   Factor V deficiency ([place]) 12/01/2009   THROMBOCYTOPENIA 12/01/2009   PULMONARY EMBOLISM, HX OF 12/01/2009   Pulmonary embolism ([place]) 11/20/2009   CERUMEN IMPACTION, RIGHT 07/29/2009   VARICOSE VEINS LOWER EXTREMITIES W/INFLAMMATION 12/01/2008   Acute thromboembolism of deep veins  of lower extremity ([place]) 09/03/2008   Aortic valve disorder 11/19/2007   HEPATIC CYST 11/19/2007   CALF PAIN, LEFT 11/11/2007   MEMORY LOSS 08/28/2007   MOLE 08/11/2007   Hyperlipidemia 09/04/2006   OSTEOARTHRITIS 09/04/2006   DYSPHAGIA, UNSPECIFIED 09/04/2006    PCP: Ann Held, DO  REFERRING PROVIDER: Patrecia Pour, MD  REFERRING DIAG: R27.0 (ICD-10-CM) - Ataxia of right upper extremity  THERAPY DIAG:  Ataxia  Ataxic gait  Muscle weakness (generalized)  Unsteadiness on feet  Difficulty in walking, not elsewhere classified  RATIONALE FOR EVALUATION AND TREATMENT:  Rehabilitation  ONSET DATE: 08/09/2021 - TIA  NEXT MD VISIT: 08/22/21 with neurologist; 09/05/2021 with PCP   SUBJECTIVE:   SUBJECTIVE STATEMENT: Patient reports no issues.  PAIN:  Are you having pain? No  PERTINENT HISTORY:  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; memory loss; HTN; mild aortic stenosis; factor V Leiden; OA; back pain; HLD; h/o DVT and PE; recent fall (07/28/21) while on Eliquis - bruising to R shoulder and ribs  PRECAUTIONS: Fall  WEIGHT BEARING RESTRICTIONS: No  FALLS: Has patient fallen in last 6 months? Yes. Number of falls 1  LIVING ENVIRONMENT: Lives with: lives with their spouse Lives in: House/apartment Stairs: No Has following equipment at  home: Single point cane, Walker - 4 wheeled, shower chair, and Grab bars  PLOF: Independent and Leisure: Reading, puzzles, building bird houses, walking daily x 3/4 mile  PATIENT GOALS: "To get back to the way I was."   OBJECTIVE:   VITAL SIGNS:  BP:  126/72 Pulse: 74 O2 sats: 93%  DIAGNOSTIC FINDINGS:  08/10/21- MRI brain: 1. No acute intracranial abnormality.  2. Findings of chronic ischemic microangiopathy and generalized volume loss. 08/09/21 - CT head: There is no acute intracranial hemorrhage or evidence of acute infarction. ASPECT score is 10. Stable chronic/nonemergent findings. 08/09/21 - CT  angio head & neck: 1. Negative for intracranial large vessel occlusion or significant stenosis.  2. No significant carotid or vertebral artery stenosis in the neck. Mild atherosclerotic disease of the carotid bifurcation bilaterally.  COGNITION: Overall cognitive status: Impaired   SENSATION: WFL  COORDINATION: Heel to shin - impaired Finger to nose - WNL Finger- thumb opposition - WNL  POSTURE:  rounded shoulders, forward head, increased thoracic kyphosis, and flexed trunk    LOWER EXTREMITY MMT:   Tested in sitting  MMT Right Eval Left Eval  Hip flexion 4 4+  Hip extension 4+ 4+  Hip abduction 5 5  Hip adduction 4 4+  Hip internal rotation 4 4  Hip external rotation 4- 4  Knee flexion 5 5  Knee extension 5 5  Ankle dorsiflexion 4+ 5  Ankle plantarflexion    Ankle inversion    Ankle eversion    (Blank rows = not tested)  BED MOBILITY:  NT  TRANSFERS: Assistive device utilized: None  Sit to stand: Complete Independence Stand to sit: Modified independence Chair to chair: Modified independence Floor:  NT  GAIT: Distance walked: 150 Assistive device utilized: None Level of assistance: SBA Gait pattern: step through pattern and ataxic Comments: initially very unsteady reaching for walls and furniture but by end of visit gait pattern considerably better  STAIRS:  Level of Assistance: SBA  Stair Negotiation Technique: Alternating Pattern  with Single Rail on Right  Number of Stairs: 14   Height of Stairs: 7  Comments: more hesitant on descent with B LE ER  FUNCTIONAL TESTS:  5 times sit to stand: 19.59 sec; recurrent falls >15 sec Timed up and go (TUG): 12.00 sec 10 meter walk test: 10.94 sec; Gait speed = 3.0 ft/sec Functional gait assessment: 17/30; < 19 = high risk fall   PATIENT SURVEYS:  ABC scale = 45% of self confidence, 55% impairment   TODAY'S TREATMENT:  10/02/21 NuStep L5 x 6 minutes DGI re-assess, see goals HS curls 25# 2 x 12 Knee ext  10#, 2 x 12 Sit to stand, 2 x 10 reps Heel raises 2 x 10 reps Side to side step with Green Tband around knees. 2 x 10 reps. SLS at sink Max of 4 sec on R, 6 sec on L.  09/29/21 Nustep L5 x44mns  Lat pull down and rows 20# 2x10  Calf raises black bar 2x12  Horizontal ABD redTB 2x10  Leg ext 15# 3x10 HS curls 25# 3x10 Leg press 40# 3x10 Marching on airex  Side steps on airex Yellow ball toss against wall standing on airex   09/25/21 NuStep level 5 x 6 minutes Gait around building outside  Leg extension 10# 3x10 cues to get TKE Leg curls 25# 3x20 On airex cone toe touches, hand cone touches Leg press 20# x10, 40# x10 Walking ball toss Direction changes Volleyball On airex ball toss On airex  eyes closed On Airex head turns Stepping over obstacles, side stepping over objects, side stepping on and off airex   09/22/21 Bile L 4 6 min 5 times sit to stand: 19.59 sec; recurrent falls >15 sec.  TODAY 9.53 sec Timed up and go (TUG): 12.00 sec TODAY 10.71 sec Ball toss on airex CGA NO LOB Walking fwd and back with ball toss 20 feet each way SBA with cuing no LOB STS on airex with wt ball chest press 10x SBA Knee ext 15# 2x10, 10#x10 RLE HS curls 25# 2x10, 15#x10 RLE Leg press 20# 2x10 Lat pull and seated row 20# 2 sets 10 to work on posture to help keep shld and head back Black tband 2 sets 10 trunk ext Obstacle course navigating on and around obj CG A- min A with cuing several LOB requiring assistance to stab. Pt had trouble picking up feet to clear obj consistently and cuing to get closer when stepping on or over obj as he tends to start too far away making it unsafe   09/18/21 Bike L4 x64mns  Knee ext 15# 2x10, 10#x10 RLE HS curls 25# 2x10, 15#x10 RLE Leg press 20# 2x10 STS w/OHP on airex 2x10 Step up on airex Side steps on airex    09/15/21  Nustep L 5 6 min Resisted gait fwd 5 x, back ward 5 x, each side 3 x 20 #, CGA with cuing to increase step length High level  Balance actvities on airex beams- marching fwd and back, tandem fwd and back and side stepping SBA Ladder step over step and lateral stepping - working to pick feet up and clear rung, cuing to hold head up STS with wt ball press 10 x chest press then 10x OH press on airex CGA Knee ext 10# 2 sets 10 with cuing for ful TKE. 5# Rt only 2 sets 5 HS curl 25 # 2 sets 12, RT only 10# 10 x Ball toss on airex SBA Ball toss and kicks alternating- trouble with quick reaction to kick esp with left leg Leg Press 20# 3 sets 10 feet 3 way  PATIENT EDUCATION: Education details: PT eval findings, anticipated POC, and recommendation for OT referral to address UE ataxia as primary concerns seem to be fine motor (pt has appt with neurologist tomorrow) Person educated: Patient and Spouse Education method: Explanation and blank referral form provided to have MD complete for OT referral Education comprehension: verbalized understanding   HOME EXERCISE PROGRAM:  [id number]  ASSESSMENT:  Clinical impression:   Patient's status re-assessed for 10 V. He demonstrates progress toward all goals and reports that he feels stronger and more balanced. He feels his RLE is still weaker than L.   OBJECTIVE IMPAIRMENTS 2 of 3 STG met. Improved fucn scores as noted above. Noted increased balance today vs last time this PTA worked with pt. Pt does need cuing to hold head up as he is very fwd flexed- added some postural ex to help with this.   ACTIVITY LIMITATIONS carrying, lifting, bending, standing, transfers, reach over head, locomotion level, and writing  PARTICIPATION LIMITATIONS: meal prep, cleaning, laundry, driving, shopping, community activity, and yard work  PERSONAL FACTORS Age, Past/current experiences, Time since onset of injury/illness/exacerbation, and 3+ comorbidities: 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; memory loss; HTN; mild aortic stenosis; factor V Leiden; OA; back pain; HLD; h/o DVT  and PE; recent fall (07/28/21) while on Eliquis - bruising to R shoulder and ribs  are also affecting patient's functional outcome.  REHAB POTENTIAL: Good  CLINICAL DECISION MAKING: Evolving/moderate complexity  EVALUATION COMPLEXITY: Moderate   GOALS: Goals reviewed with patient? Yes  SHORT TERM GOALS: Target date: 09/18/2021   Patient will be independent with initial HEP. Baseline:  Goal status: met  2.  Patient will demonstrate decreased fall risk by scoring < 15 sec on 5xSTS. Baseline: 19.59 sec Goal status:  met  3.  Patient will be educated on strategies to decrease risk of falls.  Baseline:  Goal status: met  LONG TERM GOALS: Target date: 10/16/2021   Patient will be independent with advanced/ongoing HEP to improve outcomes and carryover.  Baseline:  Goal status:ongoing  2.  Patient will be able to ambulate 600' with LRAD with good safety to access community.  Baseline:  Goal status:  met  3.  Patient will be able to step up/down curb safely with LRAD for safety with community ambulation.  Baseline:  Goal status:partially net  4.  Patient will demonstrate improved B LE strength to >/= 4+/5 for improved stability and ease of mobility . Baseline: refer to above MMT chart Goal status: ongoing  5.  Patient will demonstrate at least 25/30 on DGI to improve gait stability and reduce risk for falls. Baseline: 17/30, 8/28-19/30 Goal status: ongoing  6.  Patient will report 65% of self confidence (35% impairment) on ABC scale to demonstrate improved functional ability. Baseline: 45% of self confidence, 55% impairment Goal status: INITIAL   PLAN: PT FREQUENCY: 2x/week  PT DURATION: 8 weeks  PLANNED INTERVENTIONS: Therapeutic exercises, Therapeutic activity, Neuromuscular re-education, Balance training, Gait training, Patient/Family education, Self Care, Joint mobilization, Stair training, DME instructions, Cryotherapy, Moist heat, Taping, Ultrasound,  Ionotophoresis 62m/ml Dexamethasone, Manual therapy, and Re-evaluation  PLAN FOR NEXT SESSION: Work on balance and strength, reassess ABC scale, MMT   SMarcelina Morel DPT 10/02/2021, 11:00 AM [place] [address]Phone: [phone]  Fax:  [phone]

## 2021-10-04 ENCOUNTER — Telehealth: Payer: Self-pay | Admitting: Cardiology

## 2021-10-04 NOTE — Telephone Encounter (Signed)
Called and spoke with pt's wife. She is aware it is ideal for the Lipid labs to be drawn 3 months after starting/changing a lipid lowering medication. She verbalized understanding and states she will cancel the appointment for 9/12 to have labs drawn.

## 2021-10-04 NOTE — Telephone Encounter (Signed)
Patient's wife is following up. See 8/01 encounter regarding labs. She states they received the lab order in the mail, but the patient is already scheduled to have a lipid panel drawn on 9/12 with his PCP. She would like to know if it will still be necessary to have labs with our office in [place]. Please advise.

## 2021-10-06 ENCOUNTER — Ambulatory Visit: Payer: Medicare HMO | Attending: Family Medicine | Admitting: Physical Therapy

## 2021-10-06 DIAGNOSIS — R260 Ataxic gait: Secondary | ICD-10-CM | POA: Diagnosis not present

## 2021-10-06 DIAGNOSIS — M6281 Muscle weakness (generalized): Secondary | ICD-10-CM | POA: Insufficient documentation

## 2021-10-06 DIAGNOSIS — R2681 Unsteadiness on feet: Secondary | ICD-10-CM | POA: Diagnosis not present

## 2021-10-06 DIAGNOSIS — R270 Ataxia, unspecified: Secondary | ICD-10-CM | POA: Diagnosis not present

## 2021-10-06 NOTE — Therapy (Signed)
OUTPATIENT PHYSICAL THERAPY NEURO TREATMENT   Patient Name: Shawn [name] MRN: [id number] DOB:03-Feb-1935, 87 y.o., male Today's Date: 10/06/2021      PT End of Session - 10/06/21 1005     Visit Number 11    Date for PT Re-Evaluation 10/16/21    Authorization Type [employer]    PT Start Time 1005    PT Stop Time 1050    PT Time Calculation (min) 45 min                  Past Medical History:  Diagnosis Date   Allergy    Cardiac murmur    mild aortic sclerosis   Cataract    left eye   Clotting disorder ([place])    Factor v Leiden factor mutation   Colon polyps    Hyperplastic   Diverticulosis    Dysphagia    Factor V Leiden mutation ([place])    Hemorrhoids    History of blood clots    Hyperlipidemia    Osteoarthritis    Past Surgical History:  Procedure Laterality Date   APPENDECTOMY     CATARACT EXTRACTION  02/09/2007   right eye   INGUINAL HERNIA REPAIR     TONSILLECTOMY     VARICOSE VEIN SURGERY     VASECTOMY     Patient Active Problem List   Diagnosis Date Noted   Ataxia of right upper extremity 08/09/2021   Otitis externa 07/07/2021   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 ([place]) 03/18/2020   High serum vitamin D 01/12/2020   Pain of left thumb 02/20/2016   Preventative health care 11/13/2013   History of colon polyps 11/02/2011   Bowel habit changes 11/02/2011   Other and unspecified coagulation defects 11/02/2011   Overweight(278.02) 10/05/2010   BACK PAIN 04/21/2010   COLONIC POLYPS, ADENOMATOUS, HX OF 03/22/2010   Factor V deficiency ([place]) 12/01/2009   THROMBOCYTOPENIA 12/01/2009   PULMONARY EMBOLISM, HX OF 12/01/2009   Pulmonary embolism ([place]) 11/20/2009   CERUMEN IMPACTION, RIGHT 07/29/2009   VARICOSE VEINS LOWER EXTREMITIES W/INFLAMMATION 12/01/2008   Acute thromboembolism of deep veins of lower extremity ([place]) 09/03/2008   Aortic valve disorder 11/19/2007   HEPATIC CYST 11/19/2007   CALF PAIN, LEFT  11/11/2007   MEMORY LOSS 08/28/2007   MOLE 08/11/2007   Hyperlipidemia 09/04/2006   OSTEOARTHRITIS 09/04/2006   DYSPHAGIA, UNSPECIFIED 09/04/2006    PCP: Ann Held, DO  REFERRING PROVIDER: Patrecia Pour, MD  REFERRING DIAG: R27.0 (ICD-10-CM) - Ataxia of right upper extremity  THERAPY DIAG:  Ataxic gait  Muscle weakness (generalized)  Unsteadiness on feet  RATIONALE FOR EVALUATION AND TREATMENT:  Rehabilitation  ONSET DATE: 08/09/2021 - TIA  NEXT MD VISIT: 08/22/21 with neurologist; 09/05/2021 with PCP   SUBJECTIVE:   SUBJECTIVE STATEMENT: Patient reports no issues. Wife states " make sure you know how to use band correctly"  PAIN:  Are you having pain? No  PERTINENT HISTORY:  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; memory loss; HTN; mild aortic stenosis; factor V Leiden; OA; back pain; HLD; h/o DVT and PE; recent fall (07/28/21) while on Eliquis - bruising to R shoulder and ribs  PRECAUTIONS: Fall  WEIGHT BEARING RESTRICTIONS: No  FALLS: Has patient fallen in last 6 months? Yes. Number of falls 1  LIVING ENVIRONMENT: Lives with: lives with their spouse Lives in: House/apartment Stairs: No Has following equipment at home: Single point cane, Environmental consultant - 4 wheeled, shower chair, and Grab bars  PLOF: Independent  and Leisure: Reading, puzzles, building bird houses, walking daily x 3/4 mile  PATIENT GOALS: "To get back to the way I was."   OBJECTIVE:   VITAL SIGNS:  BP:  126/72 Pulse: 74 O2 sats: 93%  DIAGNOSTIC FINDINGS:  08/10/21- MRI brain: 1. No acute intracranial abnormality.  2. Findings of chronic ischemic microangiopathy and generalized volume loss. 08/09/21 - CT head: There is no acute intracranial hemorrhage or evidence of acute infarction. ASPECT score is 10. Stable chronic/nonemergent findings. 08/09/21 - CT angio head & neck: 1. Negative for intracranial large vessel occlusion or significant stenosis.  2. No significant carotid  or vertebral artery stenosis in the neck. Mild atherosclerotic disease of the carotid bifurcation bilaterally.  COGNITION: Overall cognitive status: Impaired   SENSATION: WFL  COORDINATION: Heel to shin - impaired Finger to nose - WNL Finger- thumb opposition - WNL  POSTURE:  rounded shoulders, forward head, increased thoracic kyphosis, and flexed trunk    LOWER EXTREMITY MMT:   Tested in sitting  MMT Right Eval Left Eval 10/06/21  R/L  Hip flexion 4 4+ 4+/4+  Hip extension 4+ 4+ 5/5  Hip abduction 5 5   Hip adduction 4 4+ 5/5  Hip internal rotation 4 4 4+/4+  Hip external rotation 4- 4 4+/4+  Knee flexion 5 5   Knee extension 5 5   Ankle dorsiflexion 4+ 5   Ankle plantarflexion     Ankle inversion     Ankle eversion     (Blank rows = not tested)  BED MOBILITY:  NT  TRANSFERS: Assistive device utilized: None  Sit to stand: Complete Independence Stand to sit: Modified independence Chair to chair: Modified independence Floor:  NT  GAIT: Distance walked: 150 Assistive device utilized: None Level of assistance: SBA Gait pattern: step through pattern and ataxic Comments: initially very unsteady reaching for walls and furniture but by end of visit gait pattern considerably better  STAIRS:  Level of Assistance: SBA  Stair Negotiation Technique: Alternating Pattern  with Single Rail on Right  Number of Stairs: 14   Height of Stairs: 7  Comments: more hesitant on descent with B LE ER  FUNCTIONAL TESTS:  5 times sit to stand: 19.59 sec; recurrent falls >15 sec Timed up and go (TUG): 12.00 sec 10 meter walk test: 10.94 sec; Gait speed = 3.0 ft/sec Functional gait assessment: 17/30; < 19 = high risk fall   PATIENT SURVEYS:  ABC scale = 45% of self confidence, 55% impairment   TODAY'S TREATMENT:   10/06/21  Performed and added to HEP ( educ wife and pt) green tband hip flex,ext and abd.  HS curl 25# 3 sets 10 LAQ 15# 3 sets 10 ( fatigued with increased wt  but tolerated) STS on airex without UE 2 sets 10 ER/IR green tband 10 x each Hip abd sitting green tband 10 x each Leg Press 40# feet 3 way 12 x each Foam beam- side stepping and tandem CGA with occassional need to grab garbs to stab Worked on stairs step over step    10/02/21 NuStep L5 x 6 minutes DGI re-assess, see goals HS curls 25# 2 x 12 Knee ext 10#, 2 x 12 Sit to stand, 2 x 10 reps Heel raises 2 x 10 reps Side to side step with Green Tband around knees. 2 x 10 reps. SLS at sink Max of 4 sec on R, 6 sec on L.  09/29/21 Nustep L5 x23mns  Lat pull down and rows 20# 2x10  Calf raises black bar 2x12  Horizontal ABD redTB 2x10  Leg ext 15# 3x10 HS curls 25# 3x10 Leg press 40# 3x10 Marching on airex  Side steps on airex Yellow ball toss against wall standing on airex   09/25/21 NuStep level 5 x 6 minutes Gait around building outside  Leg extension 10# 3x10 cues to get TKE Leg curls 25# 3x20 On airex cone toe touches, hand cone touches Leg press 20# x10, 40# x10 Walking ball toss Direction changes Volleyball On airex ball toss On airex eyes closed On Airex head turns Stepping over obstacles, side stepping over objects, side stepping on and off airex   09/22/21 Bile L 4 6 min 5 times sit to stand: 19.59 sec; recurrent falls >15 sec.  TODAY 9.53 sec Timed up and go (TUG): 12.00 sec TODAY 10.71 sec Ball toss on airex CGA NO LOB Walking fwd and back with ball toss 20 feet each way SBA with cuing no LOB STS on airex with wt ball chest press 10x SBA Knee ext 15# 2x10, 10#x10 RLE HS curls 25# 2x10, 15#x10 RLE Leg press 20# 2x10 Lat pull and seated row 20# 2 sets 10 to work on posture to help keep shld and head back Black tband 2 sets 10 trunk ext Obstacle course navigating on and around obj CG A- min A with cuing several LOB requiring assistance to stab. Pt had trouble picking up feet to clear obj consistently and cuing to get closer when stepping on or over obj as he  tends to start too far away making it unsafe   09/18/21 Bike L4 x11mns  Knee ext 15# 2x10, 10#x10 RLE HS curls 25# 2x10, 15#x10 RLE Leg press 20# 2x10 STS w/OHP on airex 2x10 Step up on airex Side steps on airex    09/15/21  Nustep L 5 6 min Resisted gait fwd 5 x, back ward 5 x, each side 3 x 20 #, CGA with cuing to increase step length High level Balance actvities on airex beams- marching fwd and back, tandem fwd and back and side stepping SBA Ladder step over step and lateral stepping - working to pick feet up and clear rung, cuing to hold head up STS with wt ball press 10 x chest press then 10x OH press on airex CGA Knee ext 10# 2 sets 10 with cuing for ful TKE. 5# Rt only 2 sets 5 HS curl 25 # 2 sets 12, RT only 10# 10 x Ball toss on airex SBA Ball toss and kicks alternating- trouble with quick reaction to kick esp with left leg Leg Press 20# 3 sets 10 feet 3 way  PATIENT EDUCATION: Education details: green tbnad hip 3 way Person educated: Patient and Spouse Education method: explanation and demo Education comprehension: verbalized understanding and demo   HOME EXERCISE PROGRAM: 10/06/21 hip 3 way green tband  [id number]  ASSESSMENT:  Clinical impression:   pt arrived with wife stating unsure how to do last issued HEP, unable see doc of HEP but did issue green tband hip 3 way for HEP. Progressed strength and balance with cuing needed,fatigue noted with increased strength  OBJECTIVE IMPAIRMENTS 2 of 3 STG met. Improved fucn scores as noted above. Noted increased balance today vs last time this PTA worked with pt. Pt does need cuing to hold head up as he is very fwd flexed- added some postural ex to help with this.   ACTIVITY LIMITATIONS carrying, lifting, bending, standing, transfers, reach over head, locomotion level,  and writing  PARTICIPATION LIMITATIONS: meal prep, cleaning, laundry, driving, shopping, community activity, and yard work  PERSONAL FACTORS Age,  Past/current experiences, Time since onset of injury/illness/exacerbation, and 3+ comorbidities: 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; memory loss; HTN; mild aortic stenosis; factor V Leiden; OA; back pain; HLD; h/o DVT and PE; recent fall (07/28/21) while on Eliquis - bruising to R shoulder and ribs  are also affecting patient's functional outcome.   REHAB POTENTIAL: Good  CLINICAL DECISION MAKING: Evolving/moderate complexity  EVALUATION COMPLEXITY: Moderate   GOALS: Goals reviewed with patient? Yes  SHORT TERM GOALS: Target date: 09/18/2021   Patient will be independent with initial HEP. Baseline:  Goal status: met  2.  Patient will demonstrate decreased fall risk by scoring < 15 sec on 5xSTS. Baseline: 19.59 sec Goal status:  met  3.  Patient will be educated on strategies to decrease risk of falls.  Baseline:  Goal status: met  LONG TERM GOALS: Target date: 10/16/2021   Patient will be independent with advanced/ongoing HEP to improve outcomes and carryover.  Baseline:  Goal status:ongoing  2.  Patient will be able to ambulate 600' with LRAD with good safety to access community.  Baseline:  Goal status:  met  3.  Patient will be able to step up/down curb safely with LRAD for safety with community ambulation.  Baseline:  Goal status:partially net  4.  Patient will demonstrate improved B LE strength to >/= 4+/5 for improved stability and ease of mobility . Baseline: refer to above MMT chart Goal status: ongoing  5.  Patient will demonstrate at least 25/30 on DGI to improve gait stability and reduce risk for falls. Baseline: 17/30, 8/28-19/30 Goal status: ongoing  6.  Patient will report 65% of self confidence (35% impairment) on ABC scale to demonstrate improved functional ability. Baseline: 45% of self confidence, 55% impairment Goal status: INITIAL   PLAN: PT FREQUENCY: 2x/week  PT DURATION: 8 weeks  PLANNED INTERVENTIONS: Therapeutic  exercises, Therapeutic activity, Neuromuscular re-education, Balance training, Gait training, Patient/Family education, Self Care, Joint mobilization, Stair training, DME instructions, Cryotherapy, Moist heat, Taping, Ultrasound, Ionotophoresis 58m/ml Dexamethasone, Manual therapy, and Re-evaluation  PLAN FOR NEXT SESSION: Work on balance and strength, reassess ABC scale,     Patient Details  Name: RArafat CocuzzaMRN: [id number] of Birth: 1May 27, 1936Referring Provider:  LAnn Held *  Encounter Date: 10/06/2021   PLaqueta Carina PTA 10/06/2021, 10:05 AM  [place] [address]Phone: [phone]  Fax:  [phone]

## 2021-10-10 ENCOUNTER — Ambulatory Visit: Payer: Medicare HMO | Admitting: Physical Therapy

## 2021-10-12 ENCOUNTER — Ambulatory Visit: Payer: Medicare HMO | Admitting: Physical Therapy

## 2021-10-12 ENCOUNTER — Encounter: Payer: Self-pay | Admitting: Physical Therapy

## 2021-10-12 DIAGNOSIS — R270 Ataxia, unspecified: Secondary | ICD-10-CM

## 2021-10-12 DIAGNOSIS — R2681 Unsteadiness on feet: Secondary | ICD-10-CM

## 2021-10-12 DIAGNOSIS — R260 Ataxic gait: Secondary | ICD-10-CM | POA: Diagnosis not present

## 2021-10-12 DIAGNOSIS — M6281 Muscle weakness (generalized): Secondary | ICD-10-CM | POA: Diagnosis not present

## 2021-10-12 NOTE — Therapy (Signed)
OUTPATIENT PHYSICAL THERAPY NEURO TREATMENT   Patient Name: Shawn [name] MRN: [id number] DOB:Sep 16, 1934, 86 y.o., male Today's Date: 10/12/2021      PT End of Session - 10/12/21 1100     Visit Number 12    Date for PT Re-Evaluation 10/16/21    PT Start Time 66    PT Stop Time 1145    PT Time Calculation (min) 45 min    Activity Tolerance Patient tolerated treatment well    Behavior During Therapy WFL for tasks assessed/performed                  Past Medical History:  Diagnosis Date   Allergy    Cardiac murmur    mild aortic sclerosis   Cataract    left eye   Clotting disorder ([place])    Factor v Leiden factor mutation   Colon polyps    Hyperplastic   Diverticulosis    Dysphagia    Factor V Leiden mutation ([place])    Hemorrhoids    History of blood clots    Hyperlipidemia    Osteoarthritis    Past Surgical History:  Procedure Laterality Date   APPENDECTOMY     CATARACT EXTRACTION  02/09/2007   right eye   [place]     Patient Active Problem List   Diagnosis Date Noted   Ataxia of right upper extremity 08/09/2021   Otitis externa 07/07/2021   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 ([place]) 03/18/2020   High serum vitamin D 01/12/2020   Pain of left thumb 02/20/2016   Preventative health care 11/13/2013   History of colon polyps 11/02/2011   Bowel habit changes 11/02/2011   Other and unspecified coagulation defects 11/02/2011   Overweight(278.02) 10/05/2010   BACK PAIN 04/21/2010   COLONIC POLYPS, ADENOMATOUS, HX OF 03/22/2010   Factor V deficiency ([place]) 12/01/2009   THROMBOCYTOPENIA 12/01/2009   PULMONARY EMBOLISM, HX OF 12/01/2009   Pulmonary embolism ([place]) 11/20/2009   CERUMEN IMPACTION, RIGHT 07/29/2009   VARICOSE VEINS LOWER EXTREMITIES W/INFLAMMATION 12/01/2008   Acute thromboembolism of deep veins of lower extremity ([place]) 09/03/2008    Aortic valve disorder 11/19/2007   HEPATIC CYST 11/19/2007   CALF PAIN, LEFT 11/11/2007   MEMORY LOSS 08/28/2007   MOLE 08/11/2007   Hyperlipidemia 09/04/2006   OSTEOARTHRITIS 09/04/2006   DYSPHAGIA, UNSPECIFIED 09/04/2006    PCP: Ann Held, DO  REFERRING PROVIDER: Patrecia Pour, MD  REFERRING DIAG: R27.0 (ICD-10-CM) - Ataxia of right upper extremity  THERAPY DIAG:  Muscle weakness (generalized)  Ataxic gait  Unsteadiness on feet  Ataxia  RATIONALE FOR EVALUATION AND TREATMENT:  Rehabilitation  ONSET DATE: 08/09/2021 - TIA  NEXT MD VISIT: 08/22/21 with neurologist; 09/05/2021 with PCP   SUBJECTIVE:   SUBJECTIVE STATEMENT: Doing pretty good, occasional pain in the R knee  PAIN:  Are you having pain? No  PERTINENT HISTORY:  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; memory loss; HTN; mild aortic stenosis; factor V Leiden; OA; back pain; HLD; h/o DVT and PE; recent fall (07/28/21) while on Eliquis - bruising to R shoulder and ribs  PRECAUTIONS: Fall  WEIGHT BEARING RESTRICTIONS: No  FALLS: Has patient fallen in last 6 months? Yes. Number of falls 1  LIVING ENVIRONMENT: Lives with: lives with their spouse Lives in: House/apartment Stairs: No Has following equipment at home: Single point cane, Environmental consultant - 4 wheeled, shower  chair, and Grab bars  PLOF: Independent and Leisure: Reading, puzzles, building bird houses, walking daily x 3/4 mile  PATIENT GOALS: "To get back to the way I was."   OBJECTIVE:   VITAL SIGNS:  BP:  126/72 Pulse: 74 O2 sats: 93%  DIAGNOSTIC FINDINGS:  08/10/21- MRI brain: 1. No acute intracranial abnormality.  2. Findings of chronic ischemic microangiopathy and generalized volume loss. 08/09/21 - CT head: There is no acute intracranial hemorrhage or evidence of acute infarction. ASPECT score is 10. Stable chronic/nonemergent findings. 08/09/21 - CT angio head & neck: 1. Negative for intracranial large vessel occlusion  or significant stenosis.  2. No significant carotid or vertebral artery stenosis in the neck. Mild atherosclerotic disease of the carotid bifurcation bilaterally.  COGNITION: Overall cognitive status: Impaired   SENSATION: WFL  COORDINATION: Heel to shin - impaired Finger to nose - WNL Finger- thumb opposition - WNL  POSTURE:  rounded shoulders, forward head, increased thoracic kyphosis, and flexed trunk    LOWER EXTREMITY MMT:   Tested in sitting  MMT Right Eval Left Eval 10/06/21  R/L  Hip flexion 4 4+ 4+/4+  Hip extension 4+ 4+ 5/5  Hip abduction 5 5   Hip adduction 4 4+ 5/5  Hip internal rotation 4 4 4+/4+  Hip external rotation 4- 4 4+/4+  Knee flexion 5 5   Knee extension 5 5   Ankle dorsiflexion 4+ 5   Ankle plantarflexion     Ankle inversion     Ankle eversion     (Blank rows = not tested)  BED MOBILITY:  NT  TRANSFERS: Assistive device utilized: None  Sit to stand: Complete Independence Stand to sit: Modified independence Chair to chair: Modified independence Floor:  NT  GAIT: Distance walked: 150 Assistive device utilized: None Level of assistance: SBA Gait pattern: step through pattern and ataxic Comments: initially very unsteady reaching for walls and furniture but by end of visit gait pattern considerably better  STAIRS:  Level of Assistance: SBA  Stair Negotiation Technique: Alternating Pattern  with Single Rail on Right  Number of Stairs: 14   Height of Stairs: 7  Comments: more hesitant on descent with B LE ER  FUNCTIONAL TESTS:  5 times sit to stand: 19.59 sec; recurrent falls >15 sec Timed up and go (TUG): 12.00 sec 10 meter walk test: 10.94 sec; Gait speed = 3.0 ft/sec Functional gait assessment: 17/30; < 19 = high risk fall   PATIENT SURVEYS:  ABC scale = 45% of self confidence, 55% impairment   TODAY'S TREATMENT:  10/12/21 NuStep L4 x 6 min Gait to back building, two flights of stairs, One rail R descending, alternating  pattern  Hamstring curls 35lb 2x10 Leg Ext 10lb 3x10 20lb resisted side step over WaTE x6 each  From airex 6in step ups x10 each  S2S form airex 2x10 Side step on and off airex 2x10   10/06/21 Performed and added to HEP ( educ wife and pt) green tband hip flex,ext and abd.  HS curl 25# 3 sets 10 LAQ 15# 3 sets 10 ( fatigued with increased wt but tolerated) STS on airex without UE 2 sets 10 ER/IR green tband 10 x each Hip abd sitting green tband 10 x each Leg Press 40# feet 3 way 12 x each Foam beam- side stepping and tandem CGA with occassional need to grab garbs to stab Worked on stairs step over step    10/02/21 NuStep L5 x 6 minutes DGI re-assess, see goals  HS curls 25# 2 x 12 Knee ext 10#, 2 x 12 Sit to stand, 2 x 10 reps Heel raises 2 x 10 reps Side to side step with Green Tband around knees. 2 x 10 reps. SLS at sink Max of 4 sec on R, 6 sec on L.  09/29/21 Nustep L5 x18mns  Lat pull down and rows 20# 2x10  Calf raises black bar 2x12  Horizontal ABD redTB 2x10  Leg ext 15# 3x10 HS curls 25# 3x10 Leg press 40# 3x10 Marching on airex  Side steps on airex Yellow ball toss against wall standing on airex   09/25/21 NuStep level 5 x 6 minutes Gait around building outside  Leg extension 10# 3x10 cues to get TKE Leg curls 25# 3x20 On airex cone toe touches, hand cone touches Leg press 20# x10, 40# x10 Walking ball toss Direction changes Volleyball On airex ball toss On airex eyes closed On Airex head turns Stepping over obstacles, side stepping over objects, side stepping on and off airex  PATIENT EDUCATION: Education details: green tbnad hip 3 way Person educated: Patient and Spouse Education method: explanation and demo Education comprehension: verbalized understanding and demo   HOME EXERCISE PROGRAM: 10/06/21 hip 3 way green tband  [id number]  ASSESSMENT:  Clinical impression:   pt arrived with wife feeling well. Progressed to outdoor ambulation and  stair negotiation. Some fatigue with second set of stair. Cues to get closer to curb when stepping up. Pt has difficult with resisted side step over WaTE bar. LOB x 1 with step ups from airex requiring mod assist to recover.   OBJECTIVE IMPAIRMENTS 2 of 3 STG met. Improved fucn scores as noted above. Noted increased balance today vs last time this PTA worked with pt. Pt does need cuing to hold head up as he is very fwd flexed- added some postural ex to help with this.   ACTIVITY LIMITATIONS carrying, lifting, bending, standing, transfers, reach over head, locomotion level, and writing  PARTICIPATION LIMITATIONS: meal prep, cleaning, laundry, driving, shopping, community activity, and yard work  PERSONAL FACTORS Age, Past/current experiences, Time since onset of injury/illness/exacerbation, and 3+ comorbidities: 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; memory loss; HTN; mild aortic stenosis; factor V Leiden; OA; back pain; HLD; h/o DVT and PE; recent fall (07/28/21) while on Eliquis - bruising to R shoulder and ribs  are also affecting patient's functional outcome.   REHAB POTENTIAL: Good  CLINICAL DECISION MAKING: Evolving/moderate complexity  EVALUATION COMPLEXITY: Moderate   GOALS: Goals reviewed with patient? Yes  SHORT TERM GOALS: Target date: 09/18/2021   Patient will be independent with initial HEP. Baseline:  Goal status: met  2.  Patient will demonstrate decreased fall risk by scoring < 15 sec on 5xSTS. Baseline: 19.59 sec Goal status:  met  3.  Patient will be educated on strategies to decrease risk of falls.  Baseline:  Goal status: met  LONG TERM GOALS: Target date: 10/16/2021   Patient will be independent with advanced/ongoing HEP to improve outcomes and carryover.  Baseline:  Goal status:ongoing  2.  Patient will be able to ambulate 600' with LRAD with good safety to access community.  Baseline:  Goal status:  met  3.  Patient will be able to  step up/down curb safely with LRAD for safety with community ambulation.  Baseline:  Goal status:partially net  4.  Patient will demonstrate improved B LE strength to >/= 4+/5 for improved stability and ease of mobility . Baseline: refer to above MMT  chart Goal status: ongoing  5.  Patient will demonstrate at least 25/30 on DGI to improve gait stability and reduce risk for falls. Baseline: 17/30, 8/28-19/30 Goal status: ongoing  6.  Patient will report 65% of self confidence (35% impairment) on ABC scale to demonstrate improved functional ability. Baseline: 45% of self confidence, 55% impairment Goal status: INITIAL   PLAN: PT FREQUENCY: 2x/week  PT DURATION: 8 weeks  PLANNED INTERVENTIONS: Therapeutic exercises, Therapeutic activity, Neuromuscular re-education, Balance training, Gait training, Patient/Family education, Self Care, Joint mobilization, Stair training, DME instructions, Cryotherapy, Moist heat, Taping, Ultrasound, Ionotophoresis 33m/ml Dexamethasone, Manual therapy, and Re-evaluation  PLAN FOR NEXT SESSION: Work on balance and strength, reassess ABC scale,     Patient Details  Name: Shawn BogleMRN: [id number] of Birth: 1Apr 03, 1936Referring Provider:  LAnn Held *  Encounter Date: 10/12/2021   Shawn [name] PTA 10/12/2021, 11:00 AM  [place] [address]Phone: [phone]  Fax:  [phone]

## 2021-10-13 ENCOUNTER — Ambulatory Visit: Payer: Medicare HMO | Admitting: Physical Therapy

## 2021-10-13 DIAGNOSIS — M6281 Muscle weakness (generalized): Secondary | ICD-10-CM

## 2021-10-13 DIAGNOSIS — R2681 Unsteadiness on feet: Secondary | ICD-10-CM | POA: Diagnosis not present

## 2021-10-13 DIAGNOSIS — R260 Ataxic gait: Secondary | ICD-10-CM | POA: Diagnosis not present

## 2021-10-13 DIAGNOSIS — R270 Ataxia, unspecified: Secondary | ICD-10-CM | POA: Diagnosis not present

## 2021-10-13 NOTE — Therapy (Addendum)
OUTPATIENT PHYSICAL THERAPY NEURO TREATMENT   Patient Name: Shawn [name] MRN: [id number] DOB:1934/07/06, 87 y.o., male Today's Date: 10/13/2021      PT End of Session - 10/13/21 1051     Visit Number 13    Date for PT Re-Evaluation 10/16/21    Authorization Type [employer]    PT Start Time 8250    PT Stop Time 1135    PT Time Calculation (min) 44 min                  Past Medical History:  Diagnosis Date   Allergy    Cardiac murmur    mild aortic sclerosis   Cataract    left eye   Clotting disorder ([place])    Factor v Leiden factor mutation   Colon polyps    Hyperplastic   Diverticulosis    Dysphagia    Factor V Leiden mutation ([place])    Hemorrhoids    History of blood clots    Hyperlipidemia    Osteoarthritis    Past Surgical History:  Procedure Laterality Date   APPENDECTOMY     CATARACT EXTRACTION  02/09/2007   right eye   INGUINAL HERNIA REPAIR     TONSILLECTOMY     VARICOSE VEIN SURGERY     VASECTOMY     Patient Active Problem List   Diagnosis Date Noted   Ataxia of right upper extremity 08/09/2021   Otitis externa 07/07/2021   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 ([place]) 03/18/2020   High serum vitamin D 01/12/2020   Pain of left thumb 02/20/2016   Preventative health care 11/13/2013   History of colon polyps 11/02/2011   Bowel habit changes 11/02/2011   Other and unspecified coagulation defects 11/02/2011   Overweight(278.02) 10/05/2010   BACK PAIN 04/21/2010   COLONIC POLYPS, ADENOMATOUS, HX OF 03/22/2010   Factor V deficiency ([place]) 12/01/2009   THROMBOCYTOPENIA 12/01/2009   PULMONARY EMBOLISM, HX OF 12/01/2009   Pulmonary embolism ([place]) 11/20/2009   CERUMEN IMPACTION, RIGHT 07/29/2009   VARICOSE VEINS LOWER EXTREMITIES W/INFLAMMATION 12/01/2008   Acute thromboembolism of deep veins of lower extremity ([place]) 09/03/2008   Aortic valve disorder 11/19/2007   HEPATIC CYST 11/19/2007   CALF PAIN, LEFT  11/11/2007   MEMORY LOSS 08/28/2007   MOLE 08/11/2007   Hyperlipidemia 09/04/2006   OSTEOARTHRITIS 09/04/2006   DYSPHAGIA, UNSPECIFIED 09/04/2006    PCP: Ann Held, DO  REFERRING PROVIDER: Patrecia Pour, MD  REFERRING DIAG: R27.0 (ICD-10-CM) - Ataxia of right upper extremity  THERAPY DIAG:  Muscle weakness (generalized)  Unsteadiness on feet  RATIONALE FOR EVALUATION AND TREATMENT:  Rehabilitation  ONSET DATE: 08/09/2021 - TIA  NEXT MD VISIT: 08/22/21 with neurologist; 09/05/2021 with PCP   SUBJECTIVE:   SUBJECTIVE STATEMENT: No issues. I feel ready to be discharged  PAIN:  Are you having pain? No  PERTINENT HISTORY:  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; memory loss; HTN; mild aortic stenosis; factor V Leiden; OA; back pain; HLD; h/o DVT and PE; recent fall (07/28/21) while on Eliquis - bruising to R shoulder and ribs  PRECAUTIONS: Fall  WEIGHT BEARING RESTRICTIONS: No  FALLS: Has patient fallen in last 6 months? Yes. Number of falls 1  LIVING ENVIRONMENT: Lives with: lives with their spouse Lives in: House/apartment Stairs: No Has following equipment at home: Single point cane, Environmental consultant - 4 wheeled, shower chair, and Grab bars  PLOF: Independent and Leisure: Reading, puzzles, building bird houses, walking daily x 3/4  mile  PATIENT GOALS: "To get back to the way I was."   OBJECTIVE:   VITAL SIGNS:  BP:  126/72 Pulse: 74 O2 sats: 93%  DIAGNOSTIC FINDINGS:  08/10/21- MRI brain: 1. No acute intracranial abnormality.  2. Findings of chronic ischemic microangiopathy and generalized volume loss. 08/09/21 - CT head: There is no acute intracranial hemorrhage or evidence of acute infarction. ASPECT score is 10. Stable chronic/nonemergent findings. 08/09/21 - CT angio head & neck: 1. Negative for intracranial large vessel occlusion or significant stenosis.  2. No significant carotid or vertebral artery stenosis in the neck. Mild  atherosclerotic disease of the carotid bifurcation bilaterally.  COGNITION: Overall cognitive status: Impaired   SENSATION: WFL  COORDINATION: Heel to shin - impaired Finger to nose - WNL Finger- thumb opposition - WNL  POSTURE:  rounded shoulders, forward head, increased thoracic kyphosis, and flexed trunk    LOWER EXTREMITY MMT:   Tested in sitting  MMT Right Eval Left Eval 10/06/21  R/L  Hip flexion 4 4+ 4+/4+  Hip extension 4+ 4+ 5/5  Hip abduction 5 5   Hip adduction 4 4+ 5/5  Hip internal rotation 4 4 4+/4+  Hip external rotation 4- 4 4+/4+  Knee flexion 5 5   Knee extension 5 5   Ankle dorsiflexion 4+ 5   Ankle plantarflexion     Ankle inversion     Ankle eversion     (Blank rows = not tested)  BED MOBILITY:  NT  TRANSFERS: Assistive device utilized: None  Sit to stand: Complete Independence Stand to sit: Modified independence Chair to chair: Modified independence Floor:  NT  GAIT: Distance walked: 150 Assistive device utilized: None Level of assistance: SBA Gait pattern: step through pattern and ataxic Comments: initially very unsteady reaching for walls and furniture but by end of visit gait pattern considerably better  STAIRS:  Level of Assistance: SBA  Stair Negotiation Technique: Alternating Pattern  with Single Rail on Right  Number of Stairs: 14   Height of Stairs: 7  Comments: more hesitant on descent with B LE ER  FUNCTIONAL TESTS:  5 times sit to stand: 19.59 sec; recurrent falls >15 sec Timed up and go (TUG): 12.00 sec 10 meter walk test: 10.94 sec; Gait speed = 3.0 ft/sec Functional gait assessment: 17/30; < 19 = high risk fall   PATIENT SURVEYS:  ABC scale = 45% of self confidence, 55% impairment   TODAY'S TREATMENT:   10/13/21 Nustep L 5 6 min Hamstring curls 35lb 3x10 Leg Ext 10lb 3x10 Resisted gait 20# fwd with 6inch step up 3 x each foot Resisted gait 20 # laterally stepping over 2 sticks 4 x each side CG with 3 LOB  requiring min A STS airex 2 set 10 Marching on airex 20 x 2 sets Side stepping over airex 20 x Obstacle course over and around obj    10/12/21 NuStep L4 x 6 min Gait to back building, two flights of stairs, One rail R descending, alternating pattern  Hamstring curls 35lb 2x10 Leg Ext 10lb 3x10 20lb resisted side step over WaTE x6 each  From airex 6in step ups x10 each  S2S form airex 2x10 Side step on and off airex 2x10   10/06/21 Performed and added to HEP ( educ wife and pt) green tband hip flex,ext and abd.  HS curl 25# 3 sets 10 LAQ 15# 3 sets 10 ( fatigued with increased wt but tolerated) STS on airex without UE 2 sets 10 ER/IR  green tband 10 x each Hip abd sitting green tband 10 x each Leg Press 40# feet 3 way 12 x each Foam beam- side stepping and tandem CGA with occassional need to grab garbs to stab Worked on stairs step over step    10/02/21 NuStep L5 x 6 minutes DGI re-assess, see goals HS curls 25# 2 x 12 Knee ext 10#, 2 x 12 Sit to stand, 2 x 10 reps Heel raises 2 x 10 reps Side to side step with Green Tband around knees. 2 x 10 reps. SLS at sink Max of 4 sec on R, 6 sec on L.  09/29/21 Nustep L5 x17mns  Lat pull down and rows 20# 2x10  Calf raises black bar 2x12  Horizontal ABD redTB 2x10  Leg ext 15# 3x10 HS curls 25# 3x10 Leg press 40# 3x10 Marching on airex  Side steps on airex Yellow ball toss against wall standing on airex   09/25/21 NuStep level 5 x 6 minutes Gait around building outside  Leg extension 10# 3x10 cues to get TKE Leg curls 25# 3x20 On airex cone toe touches, hand cone touches Leg press 20# x10, 40# x10 Walking ball toss Direction changes Volleyball On airex ball toss On airex eyes closed On Airex head turns Stepping over obstacles, side stepping over objects, side stepping on and off airex  PATIENT EDUCATION: Education details: green tbnad hip 3 way Person educated: Patient and Spouse Education method: explanation and  demo Education comprehension: verbalized understanding and demo   HBusby 10/06/21 hip 3 way green tband  [id number]  ASSESSMENT:  Clinical impression:   LTGs met except 5 partially met and 6 deferred . Pt pleased with progress, has HEP and no concerns and ready for D/C OBJECTIVE IMPAIRMENTS 2 of 3 STG met. Improved fucn scores as noted above. Noted increased balance today vs last time this PTA worked with pt. Pt does need cuing to hold head up as he is very fwd flexed- added some postural ex to help with this.   ACTIVITY LIMITATIONS carrying, lifting, bending, standing, transfers, reach over head, locomotion level, and writing  PARTICIPATION LIMITATIONS: meal prep, cleaning, laundry, driving, shopping, community activity, and yard work  PERSONAL FACTORS Age, Past/current experiences, Time since onset of injury/illness/exacerbation, and 3+ comorbidities: 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; memory loss; HTN; mild aortic stenosis; factor V Leiden; OA; back pain; HLD; h/o DVT and PE; recent fall (07/28/21) while on Eliquis - bruising to R shoulder and ribs  are also affecting patient's functional outcome.   REHAB POTENTIAL: Good  CLINICAL DECISION MAKING: Evolving/moderate complexity  EVALUATION COMPLEXITY: Moderate   GOALS: Goals reviewed with patient? Yes  SHORT TERM GOALS: Target date: 09/18/2021   Patient will be independent with initial HEP. Baseline:  Goal status: met  2.  Patient will demonstrate decreased fall risk by scoring < 15 sec on 5xSTS. Baseline: 19.59 sec Goal status:  met  3.  Patient will be educated on strategies to decrease risk of falls.  Baseline:  Goal status: met  LONG TERM GOALS: Target date: 10/16/2021   Patient will be independent with advanced/ongoing HEP to improve outcomes and carryover.  Baseline:  Goal status:met  2.  Patient will be able to ambulate 600' with LRAD with good safety to access community.   Baseline:  Goal status:  met  3.  Patient will be able to step up/down curb safely with LRAD for safety with community ambulation.  Baseline:  Goal status:partially net  4.  Patient will demonstrate improved B LE strength to >/= 4+/5 for improved stability and ease of mobility . Baseline: refer to above MMT chart Goal status: met  5.  Patient will demonstrate at least 25/30 on DGI to improve gait stability and reduce risk for falls. Baseline: 17/30, 8/28-19/30 Goal status: 10/13/21 21/30  6.  Patient will report 65% of self confidence (35% impairment) on ABC scale to demonstrate improved functional ability. Baseline: 45% of self confidence, 55% impairment Goal status: NA   PLAN: PT FREQUENCY: 2x/week  PT DURATION: 8 weeks  PLANNED INTERVENTIONS: Therapeutic exercises, Therapeutic activity, Neuromuscular re-education, Balance training, Gait training, Patient/Family education, Self Care, Joint mobilization, Stair training, DME instructions, Cryotherapy, Moist heat, Taping, Ultrasound, Ionotophoresis 59m/ml Dexamethasone, Manual therapy, and Re-evaluation  PLAN FOR NEXT SESSION: D/C PHYSICAL THERAPY DISCHARGE SUMMARY   Patient agrees to discharge. Patient goals were partially met. Patient is being discharged due to being pleased with the current functional level.  Patient Details  Name: Shawn McginleyMRN: [id number] of Birth: [birth year]Referring Provider:  LAnn Held *  Encounter Date: 10/13/2021   PLaqueta Carina PTA 10/13/2021, 10:52 AM  [place] [address]Phone: [phone]  Fax:  [address] [address]Phone: [phone]  Fax:  [phone] Patient Details  Name: Shawn HooglandMRN: [id number] of Birth: 119-Jul-1936Referring Provider:   LAnn Held *  Encounter Date: 10/13/2021   PLaqueta Carina PTA 10/13/2021, 10:52 AM  [place] [address]Phone: [phone]  Fax:  [phone]

## 2021-10-17 ENCOUNTER — Other Ambulatory Visit: Payer: Medicare HMO

## 2021-10-27 ENCOUNTER — Encounter: Payer: Self-pay | Admitting: Family Medicine

## 2021-10-27 ENCOUNTER — Ambulatory Visit (INDEPENDENT_AMBULATORY_CARE_PROVIDER_SITE_OTHER): Payer: Medicare HMO

## 2021-10-27 DIAGNOSIS — Z23 Encounter for immunization: Secondary | ICD-10-CM | POA: Diagnosis not present

## 2021-10-27 NOTE — Progress Notes (Signed)
Pt here for Influenza shot   Pt tolerated it well left deltoid

## 2021-10-30 ENCOUNTER — Ambulatory Visit (INDEPENDENT_AMBULATORY_CARE_PROVIDER_SITE_OTHER): Payer: Medicare HMO | Admitting: Family Medicine

## 2021-10-30 ENCOUNTER — Encounter: Payer: Self-pay | Admitting: Family Medicine

## 2021-10-30 VITALS — BP 124/78 | HR 75 | Temp 97.6°F | Resp 18 | Ht 66.0 in | Wt 170.2 lb

## 2021-10-30 DIAGNOSIS — F411 Generalized anxiety disorder: Secondary | ICD-10-CM

## 2021-10-30 DIAGNOSIS — R413 Other amnesia: Secondary | ICD-10-CM

## 2021-10-30 DIAGNOSIS — F329 Major depressive disorder, single episode, unspecified: Secondary | ICD-10-CM

## 2021-10-30 DIAGNOSIS — F418 Other specified anxiety disorders: Secondary | ICD-10-CM | POA: Diagnosis not present

## 2021-10-30 DIAGNOSIS — R69 Illness, unspecified: Secondary | ICD-10-CM | POA: Diagnosis not present

## 2021-10-30 HISTORY — DX: Generalized anxiety disorder: F41.1

## 2021-10-30 HISTORY — DX: Major depressive disorder, single episode, unspecified: F32.9

## 2021-10-30 MED ORDER — SERTRALINE HCL 50 MG PO TABS: 50.0000 mg | ORAL_TABLET | Freq: Every day | ORAL | 3 refills | Status: DC

## 2021-10-30 NOTE — Assessment & Plan Note (Signed)
Start zoloft 50 mg 1/2 po qd x 1 week then 1 po qd  F/u 1 month Or sooner prn

## 2021-10-30 NOTE — Assessment & Plan Note (Signed)
con't with aricept  F/u neuro  Pt asked about driving --- I told him and his wife he should not drive

## 2021-10-30 NOTE — Patient Instructions (Signed)
Major Depressive Disorder, Adult Major depressive disorder (MDD) is a mental health condition. It may also be called clinical depression or unipolar depression. MDD causes symptoms of sadness, hopelessness, and loss of interest in things. These symptoms last most of the day, almost every day, for 2 weeks. MDD can also cause physical symptoms. It can interfere with relationships and with everyday activities, such as work, school, and activities that are usually pleasant. MDD may be mild, moderate, or severe. It may be single-episode MDD, which happens once, or recurrent MDD, which may occur multiple times. What are the causes? The exact cause of this condition is not known. MDD is most likely caused by a combination of things, which may include: Your personality traits. Learned or conditioned behaviors or thoughts or feelings that reinforce negativity. Any alcohol or substance misuse. Long-term (chronic) physical or mental health illness. Going through a traumatic experience or major life changes. What increases the risk? The following factors may make someone more likely to develop MDD: A family history of depression. Being a woman. Troubled family relationships. Abnormally low levels of certain brain chemicals. Traumatic or painful events in childhood, especially abuse or loss of a parent. A lot of stress from life experiences, such as poor living conditions or discrimination. Chronic physical illness or other mental health disorders. What are the signs or symptoms? The main symptoms of MDD usually include: Constant depressed or irritable mood. A loss of interest in things and activities. Other symptoms include: Sleeping or eating too much or too little. Unexplained weight gain or weight loss. Tiredness or low energy. Being agitated, restless, or weak. Feeling hopeless, worthless, or guilty. Trouble thinking clearly or making decisions. Thoughts of suicide or thoughts of harming  others. Isolating oneself or avoiding other people or activities. Trouble completing tasks, work, or any normal obligations. Severe symptoms of this condition may include: Psychotic depression.This may include false beliefs, or delusions. It may also include seeing, hearing, tasting, smelling, or feeling things that are not real (hallucinations). Chronic depression or persistent depressive disorder. This is low-level depression that lasts for at least 2 years. Melancholic depression, or feeling extremely sad and hopeless. Catatonic depression, which includes trouble speaking and trouble moving. How is this diagnosed? This condition may be diagnosed based on: Your symptoms. Your medical and mental health history. You may be asked questions about your lifestyle, including any drug and alcohol use. A physical exam. Blood tests to rule out other conditions. MDD is confirmed if you have the following symptoms most of the day, nearly every day, in a 2-week period: Either a depressed mood or loss of interest. At least four other MDD symptoms. How is this treated? This condition is usually treated by mental health professionals, such as psychologists, psychiatrists, and clinical social workers. You may need more than one type of treatment. Treatment may include: Psychotherapy, also called talk therapy or counseling. Types of psychotherapy include: Cognitive behavioral therapy (CBT). This teaches you to recognize unhealthy feelings, thoughts, and behaviors, and replace them with positive thoughts and actions. Interpersonal therapy (IPT). This helps you to improve the way you communicate with others or relate to them. Family therapy. This treatment includes members of your family. Medicines to treat anxiety and depression. These medicines help to balance the brain chemicals that affect your emotions. Lifestyle changes. You may be asked to: Limit alcohol use and avoid drug use. Get regular  exercise. Get plenty of sleep. Make healthy eating choices. Spend more time outdoors. Brain stimulation. This may  be done if symptoms are very severe and other treatments have not worked. Examples of this treatment are electroconvulsive therapy and transcranial magnetic stimulation. Follow these instructions at home: Activity Exercise regularly and spend time outdoors. Find activities that you enjoy doing, and make time to do them. Find healthy ways to manage stress, such as: Meditation or deep breathing. Spending time in nature. Journaling. Return to your normal activities as told by your health care provider. Ask your health care provider what activities are safe for you. Alcohol and drug use If you drink alcohol: Limit how much you use to: 0-1 drink a day for women who are not pregnant. 0-2 drinks a day for men. Be aware of how much alcohol is in your drink. In the [place] one drink equals one 12 oz bottle of beer (355 mL), one 5 oz glass of wine (148 mL), or one 1 oz glass of hard liquor (44 mL). Discuss your alcohol use with your health care provider. Alcohol can affect any antidepressant medicines you are taking. Discuss any drug use with your health care provider. General instructions  Take over-the-counter and prescription medicines only as told by your health care provider. Eat a healthy diet and get plenty of sleep. Consider joining a support group. Your health care provider may be able to recommend one. Keep all follow-up visits as told by your health care provider. This is important. Where to find more information [employer] on Mental Illness: www.nami.[place]: https://carter.com/ Contact a health care provider if: Your symptoms get worse. You develop new symptoms. Get help right away if: You self-harm. You have serious thoughts about hurting yourself or others. You hallucinate. If you ever feel like you may hurt yourself or  others, or have thoughts about taking your own life, get help right away. Go to your nearest emergency department or: Call your local emergency services (911 in the [place] Call a suicide crisis helpline, such as the [facility] at 651-853-3055 or 988 in the [place]. This is open 24 hours a day in the [place] Text the Crisis Text Line at (936)008-5780 (in the [place].). Summary Major depressive disorder (MDD) is a mental health condition. MDD causes symptoms of sadness, hopelessness, and loss of interest in things. These symptoms last most of the day, almost every day, for 2 weeks. The symptoms of MDD can interfere with relationships and with everyday activities. Treatments and support are available for people who develop MDD. You may need more than one type of treatment. Get help right away if you have serious thoughts about hurting yourself or others. This information is not intended to replace advice given to you by your health care provider. Make sure you discuss any questions you have with your health care provider. Document Revised: 08/17/2020 Document Reviewed: 01/03/2019 Elsevier Patient Education  [place].

## 2021-10-30 NOTE — Progress Notes (Addendum)
Subjective:   By signing my name below, I, Shawn [name], attest that this documentation has been prepared under the direction and in the presence of Roma Schanz R DO 10/30/2021.     Patient ID: Shawn [name], male    DOB: 1934-05-25, 87 y.o.   MRN: [id number]  Chief Complaint  Patient presents with   Depression   Follow-up    Not everyday, but often enough    HPI Patient is in today for office visit. The patient is here with his partner  He complains of depression. His partner also believes that the patient is experiencing some depression symptoms. His partner reports that his irritability fluctuates . He reports that he has never been on a depression medicine. He believes that his depression occurred around the end of June to the beginning of July.  His cholesterol levels are normal. He is currently taking 10 Mg of Zetia Lab Results  Component Value Date   CHOL 166 08/11/2021   HDL 37 (L) 08/11/2021   LDLCALC 107 (H) 08/11/2021   LDLDIRECT 86.0 06/17/2014   TRIG 110 08/11/2021   CHOLHDL 4.5 08/11/2021    His partner had questions about vitamin B-12, she reports the patient is taking 1000 mcg of vitamin B-12 supplements. He started taking vitamin B-12 medication last month.   He reports he experiences symptoms in closed space, like in bed at night, since his fall in June. His partner reports he wakes up confused and anxious. He also wakes up around 02:00 - 03:00 in the morning.   Past Medical History:  Diagnosis Date   Allergy    Cardiac murmur    mild aortic sclerosis   Cataract    left eye   Clotting disorder (HCC)    Factor v Leiden factor mutation   Colon polyps    Hyperplastic   Diverticulosis    Dysphagia    Factor V Leiden mutation ([place])    Hemorrhoids    History of blood clots    Hyperlipidemia    Osteoarthritis     Past Surgical History:  Procedure Laterality Date   APPENDECTOMY     CATARACT EXTRACTION  02/09/2007   right eye    INGUINAL HERNIA REPAIR     TONSILLECTOMY     VARICOSE VEIN SURGERY     VASECTOMY      Family History  Problem Relation Age of Onset   Lung cancer Father    Heart attack Mother 8   Heart disease Mother        chf   Arthritis Mother    Colon cancer Neg Hx    Esophageal cancer Neg Hx    Rectal cancer Neg Hx    Stomach cancer Neg Hx     Social History   Socioeconomic History   Marital status: Married    Spouse name: Not on file   Number of children: 4   Years of education: Not on file   Highest education level: Not on file  Occupational History   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    Right handed   One story home   Social Determinants of Health   Financial Resource Strain: Low Risk  (10/18/2020)   Overall Financial Resource Strain (CARDIA)    Difficulty of Paying Living Expenses: Not hard at all  Food Insecurity: No Food Insecurity (10/18/2020)   Hunger Vital Sign    Worried About Running Out of Food in the Last Year: Never true    Ran Out of Food in the Last Year: Never true  Transportation Needs: No Transportation Needs (10/18/2020)   PRAPARE - Hydrologist (Medical): No    Lack of Transportation (Non-Medical): No  Physical Activity: Sufficiently Active (10/18/2020)   Exercise Vital Sign    Days of Exercise per Week: 6 days    Minutes of Exercise per Session: 30 min  Stress: No Stress Concern Present (10/18/2020)   [place]    Feeling of Stress : Not at all  Social Connections: Socially Isolated (10/18/2020)   Social Connection and Isolation Panel [NHANES]    Frequency of Communication with Friends and Family: Once a week    Frequency of Social Gatherings with Friends and Family: Once a week    Attends Religious Services: Never    Marine scientist or Organizations: No    Attends Archivist Meetings: Never    Marital Status: Married  Human resources officer Violence: Not At Risk (10/18/2020)   Humiliation, Afraid, Rape, and Kick questionnaire    Fear of Current or Ex-Partner: No    Emotionally Abused: No    Physically Abused: No    Sexually Abused: No    Outpatient Medications Prior to Visit  Medication Sig Dispense Refill   Cholecalciferol 25 MCG (1000 UT) capsule Take 5,000 Units by mouth daily.     donepezil (ARICEPT) 10 MG tablet Take 1 tablet daily 180 tablet 1   ELIQUIS 5 MG TABS tablet TAKE 1 TABLET BY MOUTH TWICE A DAY (Patient taking differently: Take 5 mg by mouth 2 (two) times daily.) 180 tablet 1   ezetimibe (ZETIA) 10 MG tablet Take 1 tablet (10 mg total) by mouth daily. 90 tablet 3   Multiple Vitamin (MULTIVITAMIN) tablet Take 1 tablet by mouth daily.     Saw Palmetto 450-15 MG CAPS Take 1 capsule by mouth daily. (Patient taking differently: Take 2 capsules by mouth daily.)     vitamin B-12 (CYANOCOBALAMIN) 1000 MCG tablet Take 1 tablet by mouth daily.     No facility-administered medications prior to visit.    Allergies  Allergen Reactions   Crestor [Rosuvastatin] Other (See Comments)    disorientation   Niacin Rash    "My whole body turned red on the prescription dose" He takes the OTC medication   Pravastatin Other (See Comments)    Disorientation    Review of Systems  Constitutional:  Negative for fever and malaise/fatigue.  HENT:  Negative for congestion.   Eyes:  Negative for blurred vision.  Respiratory:   Negative for shortness of breath.   Cardiovascular:  Negative for chest pain, palpitations and leg swelling.  Gastrointestinal:  Negative for abdominal pain, blood in stool and nausea.  Genitourinary:  Negative for dysuria and frequency.  Musculoskeletal:  Negative for falls.  Skin:  Negative for rash.  Neurological:  Negative for dizziness, loss of consciousness and headaches.  Endo/Heme/Allergies:  Negative for environmental allergies.  Psychiatric/Behavioral:  Positive for depression and memory loss. Negative for hallucinations, substance abuse and suicidal ideas. The patient is nervous/anxious and has insomnia.        Objective:    Physical Exam Vitals and nursing note reviewed.  Constitutional:      General: He is not in acute distress.    Appearance: Normal appearance. He is not ill-appearing.  HENT:     Head: Normocephalic and atraumatic.     Right Ear: External ear normal.     Left Ear: External ear normal.  Eyes:     Extraocular Movements: Extraocular movements intact.     Pupils: Pupils are equal, round, and reactive to light.  Cardiovascular:     Rate and Rhythm: Normal rate and regular rhythm.     Heart sounds: Normal heart sounds. No murmur heard.    No gallop.  Pulmonary:     Effort: Pulmonary effort is normal. No respiratory distress.     Breath sounds: Normal breath sounds. No wheezing or rales.  Skin:    General: Skin is warm and dry.  Neurological:     Mental Status: He is alert. Mental status is at baseline.     Motor: No weakness.     Coordination: Coordination normal.     Gait: Gait normal.     Deep Tendon Reflexes: Reflexes normal.  Psychiatric:        Mood and Affect: Mood is anxious and depressed.        Speech: Speech normal.        Behavior: Behavior is cooperative.        Thought Content: Thought content does not include homicidal or suicidal ideation. Thought content does not include homicidal or suicidal plan.        Judgment: Judgment  normal.     BP 124/78   Pulse 75   Temp 97.6 F (36.4 C) (Oral)   Resp 18   Ht '5\' 6"'$  (1.676 m)   Wt 170 lb 3.2 oz (77.2 kg)   SpO2 95%   BMI 27.47 kg/m  Wt Readings from Last 3 Encounters:  10/30/21 170 lb 3.2 oz (77.2 kg)  09/05/21 169 lb 6.4 oz (76.8 kg)  08/25/21 169 lb 1.9 oz (76.7 kg)    Diabetic Foot Exam - Simple   No data filed    Lab Results  Component Value Date   WBC 4.9 08/25/2021   HGB 16.7 08/25/2021   HCT 47.3 08/25/2021   PLT 150 08/25/2021   GLUCOSE 117 (H) 08/25/2021   CHOL 166 08/11/2021   TRIG 110 08/11/2021   HDL 37 (L) 08/11/2021   LDLDIRECT 86.0 06/17/2014   LDLCALC 107 (H) 08/11/2021   ALT 18 08/25/2021   AST 20 08/25/2021   NA 139 08/25/2021   K 5.5 (H) 08/25/2021   CL 101 08/25/2021   CREATININE 1.14 08/25/2021   BUN 21 08/25/2021   CO2 33 (H) 08/25/2021   TSH 2.08 08/15/2021   PSA 2.90 08/15/2021   INR 1.1 08/09/2021   HGBA1C 4.8 08/11/2021   MICROALBUR 0.7 11/15/2014    Lab Results  Component Value Date   TSH 2.08 08/15/2021   Lab Results  Component Value Date   WBC 4.9 08/25/2021   HGB 16.7 08/25/2021   HCT 47.3 08/25/2021   MCV 97.7 08/25/2021  PLT 150 08/25/2021   Lab Results  Component Value Date   NA 139 08/25/2021   K 5.5 (H) 08/25/2021   CO2 33 (H) 08/25/2021   GLUCOSE 117 (H) 08/25/2021   BUN 21 08/25/2021   CREATININE 1.14 08/25/2021   BILITOT 1.2 08/25/2021   ALKPHOS 65 08/25/2021   AST 20 08/25/2021   ALT 18 08/25/2021   PROT 7.4 08/25/2021   ALBUMIN 4.7 08/25/2021   CALCIUM 10.5 (H) 08/25/2021   ANIONGAP 5 08/25/2021   GFR 71.65 08/15/2021   Lab Results  Component Value Date   CHOL 166 08/11/2021   Lab Results  Component Value Date   HDL 37 (L) 08/11/2021   Lab Results  Component Value Date   LDLCALC 107 (H) 08/11/2021   Lab Results  Component Value Date   TRIG 110 08/11/2021   Lab Results  Component Value Date   CHOLHDL 4.5 08/11/2021   Lab Results  Component Value Date    HGBA1C 4.8 08/11/2021       Assessment & Plan:   Problem List Items Addressed This Visit       Unprioritized   MEMORY LOSS    con't with aricept  F/u neuro  Pt asked about driving --- I told him and his wife he should not drive       Depression with anxiety - Primary    Start zoloft 50 mg 1/2 po qd x 1 week then 1 po qd  F/u 1 month Or sooner prn       Relevant Medications   sertraline (ZOLOFT) 50 MG tablet     Meds ordered this encounter  Medications   sertraline (ZOLOFT) 50 MG tablet    Sig: Take 1 tablet (50 mg total) by mouth daily.    Dispense:  30 tablet    Refill:  3    I, Ann Held, DO, personally preformed the services described in this documentation.  All medical record entries made by the scribe were at my direction and in my presence.  I have reviewed the chart and discharge instructions (if applicable) and agree that the record reflects my personal performance and is accurate and complete. 10/30/2021   I,Amber Collins,acting as a scribe for Ann Held, DO.,have documented all relevant documentation on the behalf of Ann Held, DO,as directed by  Ann Held, DO while in the presence of Ann Held, DO.    Ann Held, DO

## 2021-11-10 ENCOUNTER — Other Ambulatory Visit: Payer: Self-pay

## 2021-11-10 ENCOUNTER — Emergency Department (HOSPITAL_COMMUNITY): Payer: Medicare HMO

## 2021-11-10 ENCOUNTER — Emergency Department (HOSPITAL_COMMUNITY)
Admission: EM | Admit: 2021-11-10 | Discharge: 2021-11-10 | Disposition: A | Discharge status: Home / Self Care | Payer: Medicare HMO | Attending: Emergency Medicine | Admitting: Emergency Medicine

## 2021-11-10 ENCOUNTER — Encounter (HOSPITAL_COMMUNITY): Payer: Self-pay

## 2021-11-10 DIAGNOSIS — K7689 Other specified diseases of liver: Secondary | ICD-10-CM | POA: Diagnosis not present

## 2021-11-10 DIAGNOSIS — K5730 Diverticulosis of large intestine without perforation or abscess without bleeding: Secondary | ICD-10-CM | POA: Diagnosis not present

## 2021-11-10 DIAGNOSIS — N400 Enlarged prostate without lower urinary tract symptoms: Secondary | ICD-10-CM | POA: Diagnosis not present

## 2021-11-10 DIAGNOSIS — Z7901 Long term (current) use of anticoagulants: Secondary | ICD-10-CM | POA: Insufficient documentation

## 2021-11-10 DIAGNOSIS — N281 Cyst of kidney, acquired: Secondary | ICD-10-CM | POA: Diagnosis not present

## 2021-11-10 DIAGNOSIS — D696 Thrombocytopenia, unspecified: Secondary | ICD-10-CM | POA: Insufficient documentation

## 2021-11-10 DIAGNOSIS — R1084 Generalized abdominal pain: Secondary | ICD-10-CM | POA: Diagnosis not present

## 2021-11-10 DIAGNOSIS — R109 Unspecified abdominal pain: Secondary | ICD-10-CM | POA: Diagnosis not present

## 2021-11-10 DIAGNOSIS — Z743 Need for continuous supervision: Secondary | ICD-10-CM | POA: Diagnosis not present

## 2021-11-10 DIAGNOSIS — K4090 Unilateral inguinal hernia, without obstruction or gangrene, not specified as recurrent: Secondary | ICD-10-CM | POA: Insufficient documentation

## 2021-11-10 DIAGNOSIS — R110 Nausea: Secondary | ICD-10-CM | POA: Diagnosis not present

## 2021-11-10 LAB — CBC WITH DIFFERENTIAL/PLATELET
Abs Immature Granulocytes: 0.02 10*3/uL (ref 0.00–0.07)
Basophils Absolute: 0 10*3/uL (ref 0.0–0.1)
Basophils Relative: 0 %
Eosinophils Absolute: 0.1 10*3/uL (ref 0.0–0.5)
Eosinophils Relative: 1 %
HCT: 48.1 % (ref 39.0–52.0)
Hemoglobin: 17.8 g/dL — ABNORMAL HIGH (ref 13.0–17.0)
Immature Granulocytes: 0 %
Lymphocytes Relative: 19 %
Lymphs Abs: 1 10*3/uL (ref 0.7–4.0)
MCH: 35.3 pg — ABNORMAL HIGH (ref 26.0–34.0)
MCHC: 37 g/dL — ABNORMAL HIGH (ref 30.0–36.0)
MCV: 95.4 fL (ref 80.0–100.0)
Monocytes Absolute: 0.5 10*3/uL (ref 0.1–1.0)
Monocytes Relative: 9 %
Neutro Abs: 3.6 10*3/uL (ref 1.7–7.7)
Neutrophils Relative %: 71 %
Platelets: 138 10*3/uL — ABNORMAL LOW (ref 150–400)
RBC: 5.04 MIL/uL (ref 4.22–5.81)
RDW: 11.9 % (ref 11.5–15.5)
WBC: 5.2 10*3/uL (ref 4.0–10.5)
nRBC: 0 % (ref 0.0–0.2)

## 2021-11-10 LAB — I-STAT CHEM 8, ED
BUN: 19 mg/dL (ref 8–23)
Calcium, Ion: 1.13 mmol/L — ABNORMAL LOW (ref 1.15–1.40)
Chloride: 101 mmol/L (ref 98–111)
Creatinine, Ser: 1 mg/dL (ref 0.61–1.24)
Glucose, Bld: 121 mg/dL — ABNORMAL HIGH (ref 70–99)
HCT: 48 % (ref 39.0–52.0)
Hemoglobin: 16.3 g/dL (ref 13.0–17.0)
Potassium: 3.9 mmol/L (ref 3.5–5.1)
Sodium: 141 mmol/L (ref 135–145)
TCO2: 27 mmol/L (ref 22–32)

## 2021-11-10 LAB — COMPREHENSIVE METABOLIC PANEL
ALT: 24 U/L (ref 0–44)
AST: 34 U/L (ref 15–41)
Albumin: 4.2 g/dL (ref 3.5–5.0)
Alkaline Phosphatase: 57 U/L (ref 38–126)
Anion gap: 18 — ABNORMAL HIGH (ref 5–15)
BUN: 18 mg/dL (ref 8–23)
CO2: 25 mmol/L (ref 22–32)
Calcium: 10.3 mg/dL (ref 8.9–10.3)
Chloride: 100 mmol/L (ref 98–111)
Creatinine, Ser: 1.15 mg/dL (ref 0.61–1.24)
GFR, Estimated: >60 mL/min (ref >60)
Glucose, Bld: 119 mg/dL — ABNORMAL HIGH (ref 70–99)
Potassium: 4.3 mmol/L (ref 3.5–5.1)
Sodium: 143 mmol/L (ref 135–145)
Total Bilirubin: 1.3 mg/dL — ABNORMAL HIGH (ref 0.3–1.2)
Total Protein: 7.3 g/dL (ref 6.5–8.1)

## 2021-11-10 LAB — URINALYSIS, ROUTINE W REFLEX MICROSCOPIC
Bacteria, UA: NONE SEEN
Bilirubin Urine: NEGATIVE
Glucose, UA: NEGATIVE mg/dL
Hgb urine dipstick: NEGATIVE
Ketones, ur: 20 mg/dL — AB
Leukocytes,Ua: NEGATIVE
Nitrite: NEGATIVE
Protein, ur: 30 mg/dL — AB
Specific Gravity, Urine: 1.016 (ref 1.005–1.030)
pH: 9 — ABNORMAL HIGH (ref 5.0–8.0)

## 2021-11-10 LAB — LACTIC ACID, PLASMA
Lactic Acid, Venous: 2.6 mmol/L (ref 0.5–1.9)
Lactic Acid, Venous: 1.3 mmol/L (ref 0.5–1.9)

## 2021-11-10 LAB — TYPE AND SCREEN
ABO/RH(D): O POS
Antibody Screen: NEGATIVE

## 2021-11-10 LAB — LIPASE, BLOOD: Lipase: 69 U/L — ABNORMAL HIGH (ref 11–51)

## 2021-11-10 MED ORDER — LORAZEPAM 2 MG/ML IJ SOLN
0.5000 mg | Freq: Once | INTRAMUSCULAR | Status: AC
  Administered 2021-11-10: 0.5 mg via INTRAVENOUS
  Filled 2021-11-10: qty 1

## 2021-11-10 MED ORDER — HYDROMORPHONE HCL 1 MG/ML IJ SOLN
0.5000 mg | Freq: Once | INTRAMUSCULAR | Status: AC
  Administered 2021-11-10: 0.5 mg via INTRAVENOUS
  Filled 2021-11-10: qty 1

## 2021-11-10 MED ORDER — HYDROMORPHONE HCL 1 MG/ML IJ SOLN
1.0000 mg | Freq: Once | INTRAMUSCULAR | Status: AC
  Administered 2021-11-10: 1 mg via INTRAVENOUS
  Filled 2021-11-10: qty 1

## 2021-11-10 MED ORDER — IOHEXOL 350 MG/ML SOLN
70.0000 mL | Freq: Once | INTRAVENOUS | Status: AC | PRN
  Administered 2021-11-10: 70 mL via INTRAVENOUS

## 2021-11-10 MED ORDER — ONDANSETRON HCL 4 MG/2ML IJ SOLN
4.0000 mg | Freq: Once | INTRAMUSCULAR | Status: AC
  Administered 2021-11-10: 4 mg via INTRAVENOUS
  Filled 2021-11-10: qty 2

## 2021-11-10 MED ORDER — SODIUM CHLORIDE 0.9 % IV BOLUS
1000.0000 mL | Freq: Once | INTRAVENOUS | Status: AC
  Administered 2021-11-10: 1000 mL via INTRAVENOUS

## 2021-11-10 NOTE — ED Notes (Signed)
Pt's O2 dropped to 76% on room air following medication administration. Pt placed on 2L [place] and O2 is now 95%.

## 2021-11-10 NOTE — Discharge Instructions (Addendum)
It was our pleasure to provide your ER care today - we hope that you feel better.  Follow up with general surgeon in the next 1-2 weeks - call office Monday AM to arrange appointment.   Your platelet count is mildly low (138) and your imaging made incidental note of enlarged prostate - follow up with your doctor.   Return to ER right away if worse, new symptoms, new, worsening or severe abdominal pain, if hernia pops out and is not reducible and becomes painful, if persistent nausea/vomiting, if fevers, or other concern.   You were given pain meds in the ER - no driving for the next 6 hours.

## 2021-11-10 NOTE — ED Provider Triage Note (Signed)
Emergency Medicine Provider Triage Evaluation Note  Shawn [name] , a 87 y.o. male  was evaluated in triage.  Pt complains of new abdominal mass with significant pain.  Appeared around noon when he was at lunch.  Tried have a bowel movement, was unable to.  Last known BM this morning.  Pain rated 10/10, constant, and worsened with palpation or movement.  Hx of prior right inguinal hernia repair.  Denies changes in urinary habits, fever, or chills.  Without N/V/D.  Review of Systems  Positive:  Negative: See above  Physical Exam  BP (!) 146/66 (BP Location: Right Arm)   Pulse 71   Temp (!) 97.5 F (36.4 C) (Oral)   Resp (!) 22   Ht '5\' 6"'$  (1.676 m)   Wt 75.3 kg   SpO2 99%   BMI 26.79 kg/m  Gen:   Awake, ill-appearing, appears very uncomfortable Resp:  Normal effort  MSK:   Moves extremities without difficulty  Other:  Significantly tender palpable mass of the right pannus region.  Abdomen diffusely tender.  Medical Decision Making  Medically screening exam initiated at 2:12 PM.  Appropriate orders placed.  Shawn [name] was informed that the remainder of the evaluation will be completed by another provider, this initial triage assessment does not replace that evaluation, and the importance of remaining in the ED until their evaluation is complete.  Discussed with triage nurse, patient to be brought back to next available room.   Shawn Rome, PA-C 53/20/23 1419

## 2021-11-10 NOTE — ED Provider Notes (Signed)
[facility] EMERGENCY DEPARTMENT Provider Note   CSN: [id number] Arrival date & time: 11/10/21  1339     History  Chief Complaint  Patient presents with   Abdominal Pain    Shawn [name] is a 87 y.o. male.  Pt with 'mass' just to right of suprapubic area. Pt indicates first noted today, acute onset, constant, associated w dull pain to area, worse w palpation. No nvd. No abd distension. Remote hx right inguinal hernia and repair of same. No dysuria or other gu c/o. No fever/chills.   The history is provided by the patient, medical records, a relative and the spouse. The history is limited by the condition of the patient.  Abdominal Pain Associated symptoms: no chest pain, no fever, no shortness of breath, no sore throat and no vomiting        Home Medications Prior to Admission medications   Medication Sig Start Date End Date Taking? Authorizing Provider  Cholecalciferol 25 MCG (1000 UT) capsule Take 5,000 Units by mouth daily.    [provider]  donepezil (ARICEPT) 10 MG tablet Take 1 tablet daily 05/24/21   Wertman, Clarise Cruz E, PA-C  ELIQUIS 5 MG TABS tablet TAKE 1 TABLET BY MOUTH TWICE A DAY Patient taking differently: Take 5 mg by mouth 2 (two) times daily. 04/05/21   Ann Held, DO  ezetimibe (ZETIA) 10 MG tablet Take 1 tablet (10 mg total) by mouth daily. 09/05/21   Ann Held, DO  Multiple Vitamin (MULTIVITAMIN) tablet Take 1 tablet by mouth daily.    [provider]  Saw Palmetto 450-15 MG CAPS Take 1 capsule by mouth daily. Patient taking differently: Take 2 capsules by mouth daily. 12/27/17   Ann Held, DO  sertraline (ZOLOFT) 50 MG tablet Take 1 tablet (50 mg total) by mouth daily. 10/30/21   Ann Held, DO  vitamin B-12 (CYANOCOBALAMIN) 1000 MCG tablet Take 1 tablet by mouth daily.    [provider]      Allergies    Crestor [rosuvastatin], Niacin, and Pravastatin     Review of Systems   Review of Systems  Constitutional:  Negative for fever.  HENT:  Negative for sore throat.   Eyes:  Negative for redness.  Respiratory:  Negative for shortness of breath.   Cardiovascular:  Negative for chest pain.  Gastrointestinal:  Positive for abdominal pain. Negative for vomiting.  Genitourinary:  Negative for flank pain.  Musculoskeletal:  Negative for back pain and neck pain.  Skin:  Negative for rash.  Neurological:  Negative for headaches.  Hematological:  Does not bruise/bleed easily.    Physical Exam Updated Vital Signs BP (!) 146/66 (BP Location: Right Arm)   Pulse 71   Temp (!) 97.5 F (36.4 C) (Oral)   Resp (!) 22   Ht 1.676 m ('5\' 6"'$ )   Wt 75.3 kg   SpO2 99%   BMI 26.79 kg/m  Physical Exam Vitals and nursing note reviewed.  Constitutional:      Appearance: Normal appearance. He is well-developed.  HENT:     Head: Atraumatic.     Nose: Nose normal.     Mouth/Throat:     Mouth: Mucous membranes are moist.  Eyes:     General: No scleral icterus.    Conjunctiva/sclera: Conjunctivae normal.  Neck:     Trachea: No tracheal deviation.  Cardiovascular:     Rate and Rhythm: Normal rate and regular rhythm.  Heart sounds: Normal heart sounds.  Pulmonary:     Effort: Pulmonary effort is normal. No accessory muscle usage or respiratory distress.     Breath sounds: Normal breath sounds.  Abdominal:     General: Bowel sounds are normal. There is no distension.     Palpations: Abdomen is soft.     Tenderness: There is no guarding.     Hernia: A hernia is present.     Comments: Tender right inguinal hernia ~ size of baseball   Genitourinary:    Comments: No cva tenderness. Normal external gu exam.  Musculoskeletal:        General: No swelling.     Cervical back: Neck supple.  Skin:    General: Skin is warm and dry.     Findings: No rash.  Neurological:     Mental Status: He is alert.     Comments: Alert, speech clear.    Psychiatric:        Mood and Affect: Mood normal.     ED Results / Procedures / Treatments   Labs (all labs ordered are listed, but only abnormal results are displayed) Results for orders placed or performed during the hospital encounter of 11/10/21  CBC with Differential  Result Value Ref Range   WBC 5.2 4.0 - 10.5 K/uL   RBC 5.04 4.22 - 5.81 MIL/uL   Hemoglobin 17.8 (H) 13.0 - 17.0 g/dL   HCT 48.1 39.0 - 52.0 %   MCV 95.4 80.0 - 100.0 fL   MCH 35.3 (H) 26.0 - 34.0 pg   MCHC 37.0 (H) 30.0 - 36.0 g/dL   RDW 11.9 11.5 - 15.5 %   Platelets 138 (L) 150 - 400 K/uL   nRBC 0.0 0.0 - 0.2 %   Neutrophils Relative % 71 %   Neutro Abs 3.6 1.7 - 7.7 K/uL   Lymphocytes Relative 19 %   Lymphs Abs 1.0 0.7 - 4.0 K/uL   Monocytes Relative 9 %   Monocytes Absolute 0.5 0.1 - 1.0 K/uL   Eosinophils Relative 1 %   Eosinophils Absolute 0.1 0.0 - 0.5 K/uL   Basophils Relative 0 %   Basophils Absolute 0.0 0.0 - 0.1 K/uL   Immature Granulocytes 0 %   Abs Immature Granulocytes 0.02 0.00 - 0.07 K/uL  Comprehensive metabolic panel  Result Value Ref Range   Sodium 143 135 - 145 mmol/L   Potassium 4.3 3.5 - 5.1 mmol/L   Chloride 100 98 - 111 mmol/L   CO2 25 22 - 32 mmol/L   Glucose, Bld 119 (H) 70 - 99 mg/dL   BUN 18 8 - 23 mg/dL   Creatinine, Ser 1.15 0.61 - 1.24 mg/dL   Calcium 10.3 8.9 - 10.3 mg/dL   Total Protein 7.3 6.5 - 8.1 g/dL   Albumin 4.2 3.5 - 5.0 g/dL   AST 34 15 - 41 U/L   ALT 24 0 - 44 U/L   Alkaline Phosphatase 57 38 - 126 U/L   Total Bilirubin 1.3 (H) 0.3 - 1.2 mg/dL   GFR, Estimated >60 >60 mL/min   Anion gap 18 (H) 5 - 15  Lipase, blood  Result Value Ref Range   Lipase 69 (H) 11 - 51 U/L  Urinalysis, Routine w reflex microscopic Urine, Clean Catch  Result Value Ref Range   Color, Urine YELLOW YELLOW   APPearance CLOUDY (A) CLEAR   Specific Gravity, Urine 1.016 1.005 - 1.030   pH 9.0 (H) 5.0 - 8.0  Glucose, UA NEGATIVE NEGATIVE mg/dL   Hgb urine dipstick NEGATIVE  NEGATIVE   Bilirubin Urine NEGATIVE NEGATIVE   Ketones, ur 20 (A) NEGATIVE mg/dL   Protein, ur 30 (A) NEGATIVE mg/dL   Nitrite NEGATIVE NEGATIVE   Leukocytes,Ua NEGATIVE NEGATIVE   RBC / HPF 0-5 0 - 5 RBC/hpf   WBC, UA 0-5 0 - 5 WBC/hpf   Bacteria, UA NONE SEEN NONE SEEN   Squamous Epithelial / LPF 0-5 0 - 5   Mucus PRESENT    Amorphous Crystal PRESENT   Lactic acid, plasma  Result Value Ref Range   Lactic Acid, Venous 2.6 (HH) 0.5 - 1.9 mmol/L  I-stat chem 8, ED  Result Value Ref Range   Sodium 141 135 - 145 mmol/L   Potassium 3.9 3.5 - 5.1 mmol/L   Chloride 101 98 - 111 mmol/L   BUN 19 8 - 23 mg/dL   Creatinine, Ser 1.00 0.61 - 1.24 mg/dL   Glucose, Bld 121 (H) 70 - 99 mg/dL   Calcium, Ion 1.13 (L) 1.15 - 1.40 mmol/L   TCO2 27 22 - 32 mmol/L   Hemoglobin 16.3 13.0 - 17.0 g/dL   HCT 48.0 39.0 - 52.0 %  Type and screen Ashley  Result Value Ref Range   ABO/RH(D) O POS    Antibody Screen NEG    Sample Expiration      11/13/2021,2359 Performed at [facility], [address] [place], [place] [address]      EKG None  Radiology CT ABDOMEN PELVIS W CONTRAST  Result Date: 11/10/2021 CLINICAL DATA:  Postprandial abdominal mass right inguinal region, tender to palpation EXAM: CT ABDOMEN AND PELVIS WITH CONTRAST TECHNIQUE: Multidetector CT imaging of the abdomen and pelvis was performed using the standard protocol following bolus administration of intravenous contrast. RADIATION DOSE REDUCTION: This exam was performed according to the departmental dose-optimization program which includes automated exposure control, adjustment of the mA and/or kV according to patient size and/or use of iterative reconstruction technique. CONTRAST:  31m OMNIPAQUE IOHEXOL 350 MG/ML SOLN COMPARISON:  None Available. FINDINGS: Lower chest: Hypoventilatory changes are seen at the lung bases. No acute pleural or parenchymal lung disease. Hepatobiliary: 2.1 cm simple benign  cysts are seen within the right and left lobes of the liver. No biliary duct dilation. The gallbladder is decompressed no evidence of cholelithiasis or cholecystitis. Pancreas: Pancreatic parenchymal calcifications may be sequela of chronic calcific pancreatitis. No acute inflammatory changes. No pancreatic duct dilation. Spleen: Normal in size without focal abnormality. Adrenals/Urinary Tract: 9 mm left renal cyst identified image 18/8, no specific imaging follow-up is required. The remainder of the kidneys enhance normally. No urinary tract calculi or obstructive uropathy. The adrenals are unremarkable. Small diverticula along the right anterior aspect of the bladder may be due to chronic bladder outlet obstruction given enlarged prostate. No filling defects within the bladder. Stomach/Bowel: Mild dilation of the mid to distal jejunum measuring up to 2.7 cm with scattered gas fluid levels, could reflect ileus or early obstruction. Scattered colonic diverticulosis without evidence of acute diverticulitis. The appendix is surgically absent. No bowel wall thickening or inflammatory change. Vascular/Lymphatic: Aortic atherosclerosis. No enlarged abdominal or pelvic lymph nodes. Reproductive: The prostate is enlarged, measuring 6.0 x 6.8 x 5.3 cm. Other: No free fluid or free intraperitoneal gas. There is a fat containing right inguinal hernia which may correspond to the patient's reported palpable abnormality. There is mild stranding within the herniated fat, which could reflect  incarceration. No evidence of bowel or bladder herniation at this time. Musculoskeletal: No acute or destructive bony lesions. Reconstructed images demonstrate no additional findings. IMPRESSION: 1. Fat containing right inguinal hernia, with stranding of the herniated fat consistent with incarcerated hernia. No bowel or bladder herniation at this time. 2. Mild distension of the mid to distal jejunum with scattered gas fluid levels, favor ileus  over early/intermittent obstruction. 3.  Aortic Atherosclerosis (ICD10-I70.0). 4. Enlarged prostate, with small bladder diverticulum likely signifying an element of chronic bladder outlet obstruction. Electronically Signed   By: Randa Ngo M.D.   On: 11/10/2021 16:07    Procedures Procedures    Medications Ordered in ED Medications  HYDROmorphone (DILAUDID) injection 0.5 mg (has no administration in time range)  ondansetron (ZOFRAN) injection 4 mg (has no administration in time range)  HYDROmorphone (DILAUDID) injection 1 mg (1 mg Intravenous Given 11/10/21 1420)    ED Course/ Medical Decision Making/ A&P                           Medical Decision Making Problems Addressed: Enlarged prostate: chronic illness or injury Right inguinal hernia: acute illness or injury with systemic symptoms that poses a threat to life or bodily functions Thrombocytopenia (Stamps): chronic illness or injury  Amount and/or Complexity of Data Reviewed Independent Historian:     Details: Family, hx External Data Reviewed: labs and notes. Labs: ordered. Decision-making details documented in ED Course. Radiology: ordered and independent interpretation performed. Decision-making details documented in ED Course. Discussion of management or test interpretation with external provider(s): General surgery, discussed pt and ct  Risk Prescription drug management. Parenteral controlled substances. Decision regarding hospitalization.   Iv ns. Continuous pulse ox and cardiac monitoring. Labs ordered/sent. Imaging ordered.   Diff dx includes incarcerated hernia, other mass, sbo, etc - dispo decision including potential need for admission considered if remains incarcerated, bowel obst, etc.  - will give meds, and check results and revisit.   Reviewed nursing notes and prior charts for additional history. External reports reviewed. Additional history from: family  Cardiac monitor: sinus rhythm, rate 70.  Labs  reviewed/interpreted by me - wbc normal.   CT reviewed/interpreted by me - rih w fat/no bowel incarcerated.   Pt not able to  tolerate any attempt to reduce hernia without meds on board.   Dilaudid iv. Ativan iv. Ns bolus. Zofran iv. Icepack to hernia.   Attempted manual reduction/gentle, steady pressure. Was able to reduce  hernia. Note that imaging done post reduction.   Recheck pt x 2, pain resolved. No abd pain or distension, no pain at hernia site. Hernia remains reduced. General surgery consulted re ct reading, and discussed pt with Dr Redmond Pulling - he indicates if pt clinically improved, would have f/u with them in office.    Pts symptoms remain resolved. Abd soft nt. Pt currently appears stable for d/c.  Return precautions provided.              Final Clinical Impression(s) / ED Diagnoses Final diagnoses:  None    Rx / DC Orders ED Discharge Orders     None         Lajean Saver, MD 11/10/21 1806

## 2021-11-10 NOTE — ED Triage Notes (Signed)
Pt BIB [facility] from home d/t a mass that showed up after he ate lunch around 1145. Above his penis in his lower abd is a tennis ball sized, mass that is tender to palpate. Pt reports he felt the urge to have a BM after eating before he noticed the mass being there & when he went to the bathroom he was not able to pass any stool even though he felt the urge to go. EMS started a 20g PIV in Lt FA & gave 50 Fent with no changes in his pain level. 12L unremarkable, VSS, A/Ox4.

## 2021-11-10 NOTE — ED Notes (Signed)
Discharge instructions reviewed with daughter and significant other. Follow up care reviewed. Pt and caregivers denied any concerns or questions at this time. Pt brought out of ED via wheelchair.

## 2021-11-14 ENCOUNTER — Telehealth: Payer: Self-pay | Admitting: Family Medicine

## 2021-11-14 NOTE — Telephone Encounter (Signed)
Pt was seen in the hospital on 10/6 for a hernia. Please advise

## 2021-11-14 NOTE — Telephone Encounter (Signed)
Patient's wife states her husband has been nauseous for the past few weeks and it has not gotten better. She would like to know if there is something that can been given to him to help. Please advise.

## 2021-11-15 NOTE — Telephone Encounter (Signed)
Pt has appt on 10/24. Could you see if the patient could come in sooner please?

## 2021-11-15 NOTE — Telephone Encounter (Signed)
Reschedule for tomorrow 10/12

## 2021-11-16 ENCOUNTER — Ambulatory Visit (INDEPENDENT_AMBULATORY_CARE_PROVIDER_SITE_OTHER): Payer: Medicare HMO | Admitting: Family Medicine

## 2021-11-16 ENCOUNTER — Ambulatory Visit (HOSPITAL_BASED_OUTPATIENT_CLINIC_OR_DEPARTMENT_OTHER)
Admission: RE | Admit: 2021-11-16 | Discharge: 2021-11-16 | Disposition: A | Discharge status: Home / Self Care | Payer: Medicare HMO | Source: Ambulatory Visit | Attending: Family Medicine | Admitting: Family Medicine

## 2021-11-16 ENCOUNTER — Encounter: Payer: Self-pay | Admitting: Family Medicine

## 2021-11-16 VITALS — BP 110/68 | HR 66 | Temp 97.8°F | Resp 18 | Ht 66.0 in | Wt 166.2 lb

## 2021-11-16 DIAGNOSIS — R110 Nausea: Secondary | ICD-10-CM | POA: Diagnosis not present

## 2021-11-16 DIAGNOSIS — R188 Other ascites: Secondary | ICD-10-CM | POA: Diagnosis not present

## 2021-11-16 DIAGNOSIS — R1010 Upper abdominal pain, unspecified: Secondary | ICD-10-CM | POA: Diagnosis not present

## 2021-11-16 DIAGNOSIS — R197 Diarrhea, unspecified: Secondary | ICD-10-CM | POA: Diagnosis not present

## 2021-11-16 DIAGNOSIS — F418 Other specified anxiety disorders: Secondary | ICD-10-CM | POA: Diagnosis not present

## 2021-11-16 DIAGNOSIS — K4030 Unilateral inguinal hernia, with obstruction, without gangrene, not specified as recurrent: Secondary | ICD-10-CM | POA: Diagnosis not present

## 2021-11-16 DIAGNOSIS — R1031 Right lower quadrant pain: Secondary | ICD-10-CM

## 2021-11-16 DIAGNOSIS — R69 Illness, unspecified: Secondary | ICD-10-CM | POA: Diagnosis not present

## 2021-11-16 DIAGNOSIS — M47816 Spondylosis without myelopathy or radiculopathy, lumbar region: Secondary | ICD-10-CM | POA: Diagnosis not present

## 2021-11-16 HISTORY — DX: Right lower quadrant pain: R10.31

## 2021-11-16 LAB — COMPREHENSIVE METABOLIC PANEL
ALT: 17 U/L (ref 0–53)
AST: 19 U/L (ref 0–37)
Albumin: 4.2 g/dL (ref 3.5–5.2)
Alkaline Phosphatase: 57 U/L (ref 39–117)
BUN: 18 mg/dL (ref 6–23)
CO2: 34 mEq/L — ABNORMAL HIGH (ref 19–32)
Calcium: 9.7 mg/dL (ref 8.4–10.5)
Chloride: 102 mEq/L (ref 96–112)
Creatinine, Ser: 1.09 mg/dL (ref 0.40–1.50)
GFR: 62.18 mL/min (ref >60.00)
Glucose, Bld: 104 mg/dL — ABNORMAL HIGH (ref 70–99)
Potassium: 4 mEq/L (ref 3.5–5.1)
Sodium: 142 mEq/L (ref 135–145)
Total Bilirubin: 1.1 mg/dL (ref 0.2–1.2)
Total Protein: 6.8 g/dL (ref 6.0–8.3)

## 2021-11-16 LAB — CBC WITH DIFFERENTIAL/PLATELET
Basophils Absolute: 0 10*3/uL (ref 0.0–0.1)
Basophils Relative: 0.6 % (ref 0.0–3.0)
Eosinophils Absolute: 0.1 10*3/uL (ref 0.0–0.7)
Eosinophils Relative: 3 % (ref 0.0–5.0)
HCT: 46.3 % (ref 39.0–52.0)
Hemoglobin: 16.1 g/dL (ref 13.0–17.0)
Lymphocytes Relative: 28.5 % (ref 12.0–46.0)
Lymphs Abs: 1.2 10*3/uL (ref 0.7–4.0)
MCHC: 34.8 g/dL (ref 30.0–36.0)
MCV: 99.6 fl (ref 78.0–100.0)
Monocytes Absolute: 0.4 10*3/uL (ref 0.1–1.0)
Monocytes Relative: 9.3 % (ref 3.0–12.0)
Neutro Abs: 2.5 10*3/uL (ref 1.4–7.7)
Neutrophils Relative %: 58.6 % (ref 43.0–77.0)
Platelets: 146 10*3/uL — ABNORMAL LOW (ref 150.0–400.0)
RBC: 4.65 Mil/uL (ref 4.22–5.81)
RDW: 12.8 % (ref 11.5–15.5)
WBC: 4.3 10*3/uL (ref 4.0–10.5)

## 2021-11-16 LAB — H. PYLORI ANTIBODY, IGG: H Pylori IgG: NEGATIVE

## 2021-11-16 MED ORDER — PANTOPRAZOLE SODIUM 40 MG PO TBEC: 40.0000 mg | DELAYED_RELEASE_TABLET | Freq: Every day | ORAL | 3 refills | Status: DC

## 2021-11-16 MED ORDER — SERTRALINE HCL 50 MG PO TABS: 50.0000 mg | ORAL_TABLET | Freq: Every day | ORAL | 1 refills | Status: DC

## 2021-11-16 NOTE — Patient Instructions (Signed)
Inguinal Hernia, Adult An inguinal hernia develops when fat or the intestines push through a weak spot in a muscle where the leg meets the lower abdomen (groin). This creates a bulge. This kind of hernia could also be: In the scrotum, if you are male. In folds of skin around the vagina, if you are male. There are three types of inguinal hernias: Hernias that can be pushed back into the abdomen (are reducible). This type rarely causes pain. Hernias that are not reducible (are incarcerated). Hernias that are not reducible and lose their blood supply (are strangulated). This type of hernia requires emergency surgery. What are the causes? This condition is caused by having a weak spot in the muscles or tissues in your groin. This develops over time. The hernia may poke through the weak spot when you suddenly strain your lower abdominal muscles, such as when you: Lift a heavy object. Strain to have a bowel movement. Constipation can lead to straining. Cough. What increases the risk? This condition is more likely to develop in: Males. Pregnant females. People who: Are overweight. Work in jobs that require long periods of standing or heavy lifting. Have had an inguinal hernia before. Smoke or have lung disease. These factors can lead to long-term (chronic) coughing. What are the signs or symptoms? Symptoms may depend on the size of the hernia. Often, a small inguinal hernia has no symptoms. Symptoms of a larger hernia may include: A bulge in the groin area. This is easier to see when standing. It might not be visible when lying down. Pain or burning in the groin. This may get worse when lifting, straining, or coughing. A dull ache or a feeling of pressure in the groin. An unusual bulge in the scrotum, in males. Symptoms of a strangulated inguinal hernia may include: A bulge in your groin that is very painful and tender to the touch. A bulge that turns red or purple. Fever, nausea, and  vomiting. Inability to have a bowel movement or to pass gas. How is this diagnosed? This condition is diagnosed based on your symptoms, your medical history, and a physical exam. Your health care provider may feel your groin area and ask you to cough. How is this treated? Treatment depends on the size of your hernia and whether you have symptoms. If you do not have symptoms, your health care provider may have you watch your hernia carefully and have you come in for follow-up visits. If your hernia is large or if you have symptoms, you may need surgery to repair the hernia. Follow these instructions at home: Lifestyle Avoid lifting heavy objects. Avoid standing for long periods of time. Do not use any products that contain nicotine or tobacco. These products include cigarettes, chewing tobacco, and vaping devices, such as e-cigarettes. If you need help quitting, ask your health care provider. Maintain a healthy weight. Preventing constipation You may need to take these actions to prevent or treat constipation: Drink enough fluid to keep your urine pale yellow. Take over-the-counter or prescription medicines. Eat foods that are high in fiber, such as beans, whole grains, and fresh fruits and vegetables. Limit foods that are high in fat and processed sugars, such as fried or sweet foods. General instructions You may try to push the hernia back in place by very gently pressing on it while lying down. Do not try to force the bulge back in if it will not push in easily. Watch your hernia for any changes in shape, size, or   color. Get help right away if you notice any changes. Take over-the-counter and prescription medicines only as told by your health care provider. Keep all follow-up visits. This is important. Contact a health care provider if: You have a fever or chills. You develop new symptoms. Your symptoms get worse. Get help right away if: You have pain in your groin that suddenly gets  worse. You have a bulge in your groin that: Suddenly gets bigger and does not get smaller. Becomes red or purple or painful to the touch. You are a man and you have a sudden pain in your scrotum, or the size of your scrotum suddenly changes. You cannot push the hernia back in place by very gently pressing on it when you are lying down. You have nausea or vomiting that does not go away. You have a fast heartbeat. You cannot have a bowel movement or pass gas. These symptoms may represent a serious problem that is an emergency. Do not wait to see if the symptoms will go away. Get medical help right away. Call your local emergency services (911 in the [place] Summary An inguinal hernia develops when fat or the intestines push through a weak spot in a muscle where your leg meets your lower abdomen (groin). This condition is caused by having a weak spot in muscles or tissues in your groin. Symptoms may depend on the size of the hernia, and they may include pain or swelling in your groin. A small inguinal hernia often has no symptoms. Treatment may not be needed if you do not have symptoms. If you have symptoms or a large hernia, you may need surgery to repair the hernia. Avoid lifting heavy objects. Also, avoid standing for long periods of time. This information is not intended to replace advice given to you by your health care provider. Make sure you discuss any questions you have with your health care provider. Document Revised: 09/22/2019 Document Reviewed: 09/22/2019 Elsevier Patient Education  2023 [employer]  

## 2021-11-16 NOTE — Assessment & Plan Note (Signed)
protonix daily Consider Gi f/u

## 2021-11-16 NOTE — Assessment & Plan Note (Signed)
Ct reviewed from ED  Surgery app pending---if pain con't --- go to ER  Check labs

## 2021-11-16 NOTE — Progress Notes (Addendum)
Subjective:   By signing my name below, I, Shehryar Baig, attest that this documentation has been prepared under the direction and in the presence of Ann Held, DO. 11/16/2021    Patient ID: Shawn [name], male    DOB: [birth year], 87 y.o.   MRN: [id number]  Chief Complaint  Patient presents with   ED Visit follow up    HPI Patient is in today for a ED follow up visit. He is present with his wife during this visit.   He was admitted to the ED on 11/10/2021 for abdominal pain. They found he had a right inguinal hernia and enlarged prostate. He completed a CT scan on his abdominal pelvis with contrast and found:  1. Fat containing right inguinal hernia, with stranding of the herniated fat consistent with incarcerated hernia. No bowel or bladder herniation at this time. 2. Mild distension of the mid to distal jejunum with scattered gas fluid levels, favor ileus over early/intermittent obstruction. 3.  Aortic Atherosclerosis (ICD10-I70.0). 4. Enlarged prostate, with small bladder diverticulum likely signifying an element of chronic bladder outlet obstruction. His abdominal pain has improved since his ED visit. He complains of frequent episodes of nausea for the past couple of weeks. He has not vomited yet. His wife reports he is also feeling constant cold, his hands are trembling and his hands are frequently sweating. He has a follow up appointment with a surgeon for his hernia.    Past Medical History:  Diagnosis Date   Allergy    Cardiac murmur    mild aortic sclerosis   Cataract    left eye   Clotting disorder (HCC)    Factor v Leiden factor mutation   Colon polyps    Hyperplastic   Diverticulosis    Dysphagia    Factor V Leiden mutation ([place])    Hemorrhoids    History of blood clots    Hyperlipidemia    Osteoarthritis     Past Surgical History:  Procedure Laterality Date   APPENDECTOMY     CATARACT EXTRACTION  02/09/2007   right eye   INGUINAL  HERNIA REPAIR     TONSILLECTOMY     VARICOSE VEIN SURGERY     VASECTOMY      Family History  Problem Relation Age of Onset   Lung cancer Father    Heart attack Mother 24   Heart disease Mother        chf   Arthritis Mother    Colon cancer Neg Hx    Esophageal cancer Neg Hx    Rectal cancer Neg Hx    Stomach cancer Neg Hx     Social History   Socioeconomic History   Marital status: Married    Spouse name: Not on file   Number of children: 4   Years of education: Not on file   Highest education level: Not on file  Occupational History   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    Right handed   One story home   Social Determinants of Health   Financial Resource Strain: Low Risk  (10/18/2020)   Overall Financial Resource Strain (CARDIA)    Difficulty of Paying Living Expenses: Not hard at all  Food Insecurity: No Food Insecurity (10/18/2020)   Hunger Vital Sign    Worried About Running Out of Food in the Last Year: Never true    Ran Out of Food in the Last Year: Never true  Transportation Needs: No Transportation Needs (10/18/2020)   PRAPARE - Hydrologist (Medical): No    Lack of Transportation (Non-Medical): No  Physical Activity: Sufficiently Active (10/18/2020)   Exercise Vital Sign    Days of Exercise per Week: 6 days    Minutes of Exercise per Session: 30 min  Stress: No Stress Concern Present (10/18/2020)   [place]    Feeling of Stress : Not at all  Social Connections: Socially Isolated (10/18/2020)   Social Connection and Isolation Panel [NHANES]    Frequency of Communication with Friends and Family: Once a week    Frequency of Social Gatherings with Friends and Family: Once a week    Attends Religious Services: Never    Marine scientist or Organizations: No    Attends Archivist Meetings: Never    Marital Status: Married  Human resources officer Violence: Not At Risk (10/18/2020)   Humiliation, Afraid, Rape, and Kick questionnaire    Fear of Current or Ex-Partner: No    Emotionally Abused: No    Physically Abused: No    Sexually Abused: No    Outpatient Medications Prior to Visit  Medication Sig Dispense Refill   Cholecalciferol 25 MCG (1000 UT) capsule Take 5,000 Units by mouth daily.     donepezil (ARICEPT) 10 MG tablet Take 1 tablet daily 180 tablet 1   ELIQUIS 5 MG TABS tablet TAKE 1 TABLET BY MOUTH TWICE A DAY (Patient taking differently: Take 5 mg by mouth 2 (two) times daily.) 180 tablet 1   ezetimibe (ZETIA) 10 MG tablet Take 1 tablet (10 mg total) by mouth daily. 90 tablet 3   Multiple Vitamin (MULTIVITAMIN) tablet Take 1 tablet by mouth daily.     Saw Palmetto 450-15 MG CAPS Take 1 capsule by mouth daily. (Patient taking differently: Take 2 capsules by mouth daily.)     vitamin B-12 (CYANOCOBALAMIN) 1000 MCG tablet Take 1 tablet by mouth daily.     sertraline (ZOLOFT) 50 MG tablet Take 1 tablet (50 mg total) by mouth daily. 30 tablet 3   No facility-administered medications prior to visit.    Allergies  Allergen Reactions   Crestor [Rosuvastatin] Other (See Comments)    disorientation   Niacin Rash    "My whole body turned red on the prescription dose" He takes the OTC medication   Pravastatin Other (See Comments)    Disorientation    Review of Systems  Constitutional:  Positive for chills and diaphoresis  (bilateral hands). Negative for fever and malaise/fatigue.  HENT:  Negative for congestion.   Eyes:  Negative for blurred vision.  Respiratory:  Negative for shortness of breath.   Cardiovascular:  Negative for chest pain, palpitations and leg swelling.  Gastrointestinal:  Positive for nausea. Negative for abdominal pain, blood in stool and vomiting.  Genitourinary:  Negative for dysuria and frequency.  Musculoskeletal:  Negative for falls.  Skin:  Negative for rash.  Neurological:  Positive for tremors (hands). Negative for dizziness, loss of consciousness and headaches.  Endo/Heme/Allergies:  Negative for environmental allergies.  Psychiatric/Behavioral:  Negative for depression. The patient is not nervous/anxious.        Objective:    Physical Exam Vitals and nursing note reviewed.  Constitutional:      General: He is not in acute distress.    Appearance: Normal appearance. He is not ill-appearing.  HENT:     Head: Normocephalic and atraumatic.     Right Ear: External ear normal.     Left Ear: External ear normal.  Eyes:     Extraocular Movements: Extraocular movements intact.     Pupils: Pupils are equal, round, and reactive to light.  Cardiovascular:     Rate and Rhythm: Normal rate and regular rhythm.     Heart sounds: Normal heart sounds. No murmur heard.    No gallop.  Pulmonary:     Effort: Pulmonary effort is normal. No respiratory distress.     Breath sounds: Normal breath sounds. No wheezing or rales.  Abdominal:     General: Bowel sounds are normal.     Palpations: Abdomen is soft.     Tenderness: There is abdominal tenderness (right groin). There is no guarding or rebound.     Hernia: A hernia is present. Hernia is present in the right inguinal area.     Comments: Tenderness in the right groin area with hernia palpitated, but it was reducible  Musculoskeletal:     Cervical back: Normal range of motion.  Skin:    General: Skin is warm and dry.  Neurological:      Mental Status: He is alert and oriented to person, place, and time.  Psychiatric:        Judgment: Judgment normal.     BP 110/68 (BP Location: Left Arm, Patient Position: Sitting, Cuff Size: Normal)   Pulse 66   Temp 97.8 F (36.6 C) (Oral)   Resp 18   Ht '5\' 6"'$  (1.676 m)   Wt 166 lb 3.2 oz (75.4 kg)   SpO2 95%   BMI 26.83 kg/m  Wt Readings from Last 3 Encounters:  11/16/21 166 lb 3.2 oz (75.4 kg)  11/10/21 166 lb (75.3 kg)  10/30/21 170 lb 3.2 oz (77.2 kg)    Diabetic Foot Exam - Simple   No data filed    Lab Results  Component Value Date   WBC 4.3 11/16/2021   HGB 16.1 11/16/2021   HCT 46.3 11/16/2021   PLT 146.0 (L) 11/16/2021   GLUCOSE 104 (H) 11/16/2021   CHOL 166 08/11/2021   TRIG 110 08/11/2021   HDL 37 (L) 08/11/2021   LDLDIRECT 86.0 06/17/2014   LDLCALC 107 (H) 08/11/2021   ALT 17 11/16/2021   AST 19 11/16/2021   NA 142 11/16/2021   K 4.0 11/16/2021   CL 102 11/16/2021   CREATININE 1.09 11/16/2021   BUN 18 11/16/2021   CO2 34 (H) 11/16/2021   TSH 2.08 08/15/2021   PSA 2.90 08/15/2021   INR 1.1 08/09/2021   HGBA1C 4.8 08/11/2021   MICROALBUR 0.7 11/15/2014    Lab Results  Component Value Date   TSH 2.08 08/15/2021   Lab Results  Component Value Date   WBC 4.3 11/16/2021   HGB 16.1 11/16/2021   HCT 46.3 11/16/2021   MCV 99.6 11/16/2021  PLT 146.0 (L) 11/16/2021   Lab Results  Component Value Date   NA 142 11/16/2021   K 4.0 11/16/2021   CO2 34 (H) 11/16/2021   GLUCOSE 104 (H) 11/16/2021   BUN 18 11/16/2021   CREATININE 1.09 11/16/2021   BILITOT 1.1 11/16/2021   ALKPHOS 57 11/16/2021   AST 19 11/16/2021   ALT 17 11/16/2021   PROT 6.8 11/16/2021   ALBUMIN 4.2 11/16/2021   CALCIUM 9.7 11/16/2021   ANIONGAP 18 (H) 11/10/2021   GFR 62.18 11/16/2021   Lab Results  Component Value Date   CHOL 166 08/11/2021   Lab Results  Component Value Date   HDL 37 (L) 08/11/2021   Lab Results  Component Value Date   LDLCALC 107 (H)  08/11/2021   Lab Results  Component Value Date   TRIG 110 08/11/2021   Lab Results  Component Value Date   CHOLHDL 4.5 08/11/2021   Lab Results  Component Value Date   HGBA1C 4.8 08/11/2021       Assessment & Plan:   Problem List Items Addressed This Visit       Unprioritized   Depression with anxiety   Relevant Medications   sertraline (ZOLOFT) 50 MG tablet   Right groin pain - Primary    Ct reviewed from ED  Surgery app pending---if pain con't --- go to ER  Check labs       Relevant Orders   CBC with Differential/Platelet (Completed)   Comprehensive metabolic panel (Completed)   HELICOBACTER PYLORI  ANTIBODY, IGM   POCT Urinalysis Dipstick (Automated)   Ambulatory referral to General Surgery   H. pylori antibody, IgG (Completed)   DG Abd 2 Views (Completed)   Other Visit Diagnoses     Incarcerated inguinal hernia       Relevant Orders   Ambulatory referral to General Surgery   DG Abd 2 Views (Completed)   Nausea       Relevant Medications   pantoprazole (PROTONIX) 40 MG tablet   Other Relevant Orders   H. pylori antibody, IgG (Completed)   DG Abd 2 Views (Completed)        Meds ordered this encounter  Medications   sertraline (ZOLOFT) 50 MG tablet    Sig: Take 1 tablet (50 mg total) by mouth daily.    Dispense:  90 tablet    Refill:  1   pantoprazole (PROTONIX) 40 MG tablet    Sig: Take 1 tablet (40 mg total) by mouth daily.    Dispense:  30 tablet    Refill:  3    I, Ann Held, DO, personally preformed the services described in this documentation.  All medical record entries made by the scribe were at my direction and in my presence.  I have reviewed the chart and discharge instructions (if applicable) and agree that the record reflects my personal performance and is accurate and complete. 11/16/2021   I,Shehryar Baig,acting as a scribe for Ann Held, DO.,have documented all relevant documentation on the behalf of Ann Held, DO,as directed by  Ann Held, DO while in the presence of Ann Held, DO.    Ann Held, DO

## 2021-11-17 ENCOUNTER — Other Ambulatory Visit: Payer: Self-pay | Admitting: Family Medicine

## 2021-11-17 MED ORDER — ONDANSETRON HCL 8 MG PO TABS: 8.0000 mg | ORAL_TABLET | Freq: Three times a day (TID) | ORAL | 0 refills | Status: DC | PRN

## 2021-11-23 ENCOUNTER — Ambulatory Visit: Payer: Medicare HMO | Admitting: Neurology

## 2021-11-28 ENCOUNTER — Ambulatory Visit: Payer: Self-pay | Admitting: General Surgery

## 2021-11-28 ENCOUNTER — Telehealth: Payer: Medicare HMO | Admitting: Family Medicine

## 2021-11-28 DIAGNOSIS — K4091 Unilateral inguinal hernia, without obstruction or gangrene, recurrent: Secondary | ICD-10-CM | POA: Diagnosis not present

## 2021-11-28 DIAGNOSIS — D6851 Activated protein C resistance: Secondary | ICD-10-CM | POA: Diagnosis not present

## 2021-11-28 DIAGNOSIS — G319 Degenerative disease of nervous system, unspecified: Secondary | ICD-10-CM | POA: Diagnosis not present

## 2021-12-18 ENCOUNTER — Other Ambulatory Visit: Payer: Medicare HMO

## 2021-12-20 ENCOUNTER — Ambulatory Visit (INDEPENDENT_AMBULATORY_CARE_PROVIDER_SITE_OTHER): Payer: Medicare HMO

## 2021-12-20 DIAGNOSIS — Z0000 Encounter for general adult medical examination without abnormal findings: Secondary | ICD-10-CM

## 2021-12-20 NOTE — Progress Notes (Signed)
I connected with  Shawn [name] on 12/20/21 by a audio enabled telemedicine application and verified that I am speaking with the correct person using two identifiers.  Patient Location: Home  Provider Location: Home Office  I discussed the limitations of evaluation and management by telemedicine. The patient expressed understanding and agreed to proceed.   Subjective:   Shawn [name] is a 87 y.o. male who presents for Medicare Annual/Subsequent preventive examination.  Review of Systems     Cardiac Risk Factors include: advanced age (>75mn, >>36women)     Objective:    There were no vitals filed for this visit. There is no height or weight on file to calculate BMI.     12/20/2021   10:01 AM 11/10/2021    1:57 PM 09/22/2021   11:04 AM 08/25/2021    2:51 PM 08/22/2021    9:10 AM 08/21/2021    9:15 AM 08/10/2021    6:00 PM  Advanced Directives  Does Patient Have a Medical Advance Directive? Yes No Yes No Yes Yes No  Type of AParamedicof [place] will  [place] will   [place] will   Does patient want to make changes to medical advance directive?   No - Patient declined No - Patient declined  No - Patient declined   Copy of [place] Chart? No - copy requested        Would patient like information on creating a medical advance directive?  No - Patient declined  No - Patient declined  No - Patient declined No - Patient declined    Current Medications (verified) Outpatient Encounter Medications as of 12/20/2021  Medication Sig   acetaminophen (TYLENOL) 500 MG tablet Take 500-1,000 mg by mouth every 6 (six) hours as needed (pain.).   ascorbic acid (VITAMIN C) 500 MG tablet Take 500 mg by mouth daily with lunch.   Cholecalciferol 125 MCG (5000 UT) TABS Take 5,000 Units by mouth every Monday, Tuesday, Wednesday, Thursday, and Friday.  With lunch    donepezil (ARICEPT) 10 MG tablet Take 1 tablet daily   ELIQUIS 5 MG TABS tablet TAKE 1 TABLET BY MOUTH TWICE A DAY   ezetimibe (ZETIA) 10 MG tablet Take 1 tablet (10 mg total) by mouth daily.   Multiple Vitamin (MULTIVITAMIN WITH MINERALS) TABS tablet Take 1 tablet by mouth daily with lunch.   pantoprazole (PROTONIX) 40 MG tablet Take 1 tablet (40 mg total) by mouth daily.   saw palmetto 160 MG capsule Take 320 mg by mouth daily with lunch.   vitamin B-12 (CYANOCOBALAMIN) 1000 MCG tablet Take 1,000 mcg by mouth daily with lunch.   [DISCONTINUED] ondansetron (ZOFRAN) 8 MG tablet Take 1 tablet (8 mg total) by mouth every 8 (eight) hours as needed for nausea. (Patient not taking: Reported on 12/15/2021)   [DISCONTINUED] sertraline (ZOLOFT) 50 MG tablet Take 1 tablet (50 mg total) by mouth daily. (Patient not taking: Reported on 12/15/2021)   No facility-administered encounter medications on file as of 12/20/2021.    Allergies (verified) Crestor [rosuvastatin], Niacin, and Pravastatin   History: Past Medical History:  Diagnosis Date   Allergy    Cardiac murmur    mild aortic sclerosis   Cataract    left eye   Clotting disorder (HCC)    Factor v Leiden factor mutation   Colon polyps    Hyperplastic   Diverticulosis    Dysphagia  Factor V Leiden mutation ([place])    Hemorrhoids    History of blood clots    Hyperlipidemia    Osteoarthritis    Past Surgical History:  Procedure Laterality Date   APPENDECTOMY     CATARACT EXTRACTION  02/09/2007   right eye   INGUINAL HERNIA REPAIR     TONSILLECTOMY     VARICOSE VEIN SURGERY     VASECTOMY     Family History  Problem Relation Age of Onset   Lung cancer Father    Heart attack Mother 78   Heart disease Mother        chf   Arthritis Mother    Colon cancer Neg Hx    Esophageal cancer Neg Hx    Rectal cancer Neg Hx    Stomach cancer Neg Hx    Social History   Socioeconomic History   Marital status: Married    Spouse name:  Not on file   Number of children: 4   Years of education: Not on file   Highest education level: Not on file  Occupational History   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    Right handed   One story home   Social Determinants of Health   Financial Resource Strain: Low Risk  (12/20/2021)   Overall Financial Resource Strain (CARDIA)    Difficulty of Paying Living Expenses: Not hard at all  Food Insecurity: No Food Insecurity (12/20/2021)   Hunger Vital Sign    Worried About Running Out of Food in the Last Year: Never true    Ran Out of Food in the Last Year: Never true  Transportation Needs: No Transportation Needs (12/20/2021)   PRAPARE - Hydrologist (Medical): No    Lack of Transportation (Non-Medical): No  Physical Activity: Inactive (12/20/2021)   Exercise Vital Sign    Days of Exercise per Week: 0 days    Minutes of Exercise per Session: 0 min  Stress: No Stress Concern Present (12/20/2021)   [place]    Feeling of Stress : Only a little  Social Connections: Moderately Isolated (12/20/2021)   Social Connection and Isolation Panel [NHANES]    Frequency of Communication with Friends and Family: Twice a week    Frequency of Social Gatherings with Friends and Family: Twice a week    Attends Religious Services: Never    Marine scientist or Organizations: No    Attends Programme researcher, broadcasting/film/video: Never    Marital Status: Married    Tobacco Counseling Counseling given: Not Answered   Clinical Intake:  Pre-visit preparation completed: Yes  Pain : No/denies pain     Nutritional Risks: None Diabetes: No  How often do you need to have someone help you when you read  instructions, pamphlets, or other written materials from your doctor or pharmacy?: 1 - Never  Diabetic?no  Interpreter Needed?: No  Information entered by :: Charlott Rakes, LPN   Activities of Daily Living    12/20/2021   10:02 AM 08/10/2021    6:00 PM  In your present state of health, do you have any difficulty performing the following activities:  Hearing? 0   Vision? 0   Difficulty concentrating or making decisions? 0   Walking or climbing stairs? 0   Dressing or bathing? 0   Doing errands, shopping? 0 0  Preparing Food and eating ? N   Using the Toilet? N   In the past six months, have you accidently leaked urine? N   Do you have problems with loss of bowel control? N   Managing your Medications? N   Managing your Finances? N   Housekeeping or managing your Housekeeping? N     Patient Care Team: Carollee Herter, Alferd Apa, DO as PCP - General Minus Breeding, MD as Consulting Physician (Cardiology) Warden Fillers, MD as Consulting Physician (Ophthalmology)  Indicate any recent Medical Services you may have received from other than [facility] providers in the past year (date may be approximate).     Assessment:   This is a routine wellness examination for Shawn [name].  Hearing/Vision screen Hearing Screening - Comments:: Pt denies any hearing issues  Vision Screening - Comments:: Pt follows up with Dr Katy Fitch for annual eye exams   Dietary issues and exercise activities discussed: Current Exercise Habits: The patient does not participate in regular exercise at present   Goals Addressed             This Visit's Progress    Patient Stated       Do  well after surgery  this year        Depression Screen    12/20/2021    9:58 AM 10/30/2021    1:20 PM 10/18/2020    8:27 AM 01/11/2020    8:31 AM 01/06/2019    9:21 AM 12/30/2017    7:59 AM 12/24/2016    9:40 AM  PHQ 2/9 Scores  PHQ - 2 Score 0 3 0 0 0 0 0  PHQ- 9 Score 0 15    3     Fall Risk    12/20/2021   10:02 AM 08/22/2021    9:09 AM 05/24/2021   11:04 AM 11/22/2020   11:30 AM 10/18/2020    8:26 AM  Fall Risk   Falls in the past year? 1 1 0 0 0  Number falls in past yr: 1 1 0 0 0  Injury with Fall? 1 1 0 0 0  Risk for fall due to : History of fall(s);Impaired balance/gait      Follow up Education provided;Falls prevention discussed    Falls prevention discussed    FALL RISK PREVENTION PERTAINING TO THE HOME:  Any stairs in or around the home? No  If so, are there any without handrails? No  Home free of loose throw rugs in walkways, pet beds, electrical cords, etc? Yes  Adequate lighting in your home to reduce risk of falls? Yes   ASSISTIVE DEVICES UTILIZED TO PREVENT FALLS:  Life alert? No  Use of a cane, walker or w/c? No  Grab bars in the bathroom? No  Shower chair or bench in shower? Yes  Elevated toilet seat or a handicapped toilet? No   TIMED UP AND GO:  Was the test  performed? No .   Cognitive Function:    05/24/2021   11:00 AM 12/22/2015   10:02 AM  MMSE - Mini Mental State Exam  Orientation to time 4 5  Orientation to Place 4 5  Registration 3 3  Attention/ Calculation 5 5  Recall 1 3  Language- name 2 objects 2 2  Language- repeat 1 1  Language- follow 3 step command 3 3  Language- read & follow direction 1 1  Write a sentence 1 1  Copy design 0 1  Total score 25 30      02/29/2020   11:00 AM  Montreal Cognitive Assessment   Visuospatial/ Executive (0/5) 1  Naming (0/3) 3  Attention: Read list of digits (0/2) 1  Attention: Read list of letters (0/1) 1  Attention: Serial 7 subtraction starting at 100 (0/3) 1  Language: Repeat phrase (0/2) 1   Language : Fluency (0/1) 0  Abstraction (0/2) 1  Delayed Recall (0/5) 0  Orientation (0/6) 4  Total 13  Adjusted Score (based on education) 14      12/20/2021   10:04 AM  6CIT Screen  What Year? 0 points  What month? 0 points  What time? 0 points  Count back from 20 0 points  Months in reverse 0 points  Repeat phrase 0 points  Total Score 0 points    Immunizations Immunization History  Administered Date(s) Administered   Fluad Quad(high Dose 65+) 11/13/2018, 11/11/2019, 11/16/2020, 10/27/2021   Influenza Split 11/17/2010, 11/29/2011   Influenza Whole 11/12/2007, 11/17/2008, 11/09/2009   Influenza, High Dose Seasonal PF 11/15/2014, 11/10/2015, 11/23/2016, 11/13/2017   Influenza,inj,Quad PF,6+ Mos 10/30/2012, 11/13/2013, 02/01/2015   Influenza-Unspecified 11/16/2020   Moderna Sars-Covid-2 Vaccination 02/27/2019, 03/30/2019, 12/07/2019   Pneumococcal Conjugate-13 12/15/2013   Pneumococcal Polysaccharide-23 09/04/2005, 01/21/2012, 05/03/2020   Td 01/18/1999, 10/13/2008   Tdap 01/21/2012   Zoster Recombinat (Shingrix) 02/06/2017, 05/07/2017   Zoster, Live 11/27/2006    TDAP status: Up to date  Flu Vaccine status: Up to date  Pneumococcal vaccine status: Up to date  Covid-19 vaccine status: Completed vaccines  Qualifies for Shingles Vaccine? Yes   Zostavax completed Yes   Shingrix Completed?: Yes  Screening Tests Health Maintenance  Topic Date Due   COVID-19 Vaccine (4 - Moderna risk series) 02/01/2020   TETANUS/TDAP  01/20/2022   Medicare Annual Wellness (AWV)  12/21/2022   Pneumonia Vaccine 85+ Years old  Completed   INFLUENZA VACCINE  Completed   Zoster Vaccines- Shingrix  Completed   HPV VACCINES  Aged Out    Health Maintenance  Health Maintenance Due  Topic Date Due   COVID-19 Vaccine (4 - Moderna risk series) 02/01/2020    Colorectal cancer screening: No longer required.    Additional Screening:   Vision Screening: Recommended annual  ophthalmology exams for early detection of glaucoma and other disorders of the eye. Is the patient up to date with their annual eye exam?  Yes  Who is the provider or what is the name of the office in which the patient attends annual eye exams? Dr Katy Fitch  If pt is not established with a provider, would they like to be referred to a provider to establish care? No .   Dental Screening: Recommended annual dental exams for proper oral hygiene  Community Resource Referral / Chronic Care Management: CRR required this visit?  No   CCM required this visit?  No      Plan:     I have personally reviewed and  noted the following in the patient's chart:   Medical and social history Use of alcohol, tobacco or illicit drugs  Current medications and supplements including opioid prescriptions. Patient is not currently taking opioid prescriptions. Functional ability and status Nutritional status Physical activity Advanced directives List of other physicians Hospitalizations, surgeries, and ER visits in previous 12 months Vitals Screenings to include cognitive, depression, and falls Referrals and appointments  In addition, I have reviewed and discussed with patient certain preventive protocols, quality metrics, and best practice recommendations. A written personalized care plan for preventive services as well as general preventive health recommendations were provided to patient.     Willette Brace, LPN   [id number]   Nurse Notes: none

## 2021-12-20 NOTE — Progress Notes (Signed)
Surgical Instructions    Your procedure is scheduled on Friday December 1st.  Report to [facility] Main Entrance "A" at 1 P.M., then check in with the Admitting office.  Call this number if you have problems the morning of surgery:  [phone]   If you have any questions prior to your surgery date call [phone]: Open Monday-Friday 8am-4pm If you experience any cold or flu symptoms such as cough, fever, chills, shortness of breath, etc. between now and your scheduled surgery, please notify us at the above number     Remember:  Do not eat after midnight the night before your surgery  You may drink clear liquids until 12pm the afternoon of your surgery.   Clear liquids allowed are: Water, Non-Citrus Juices (without pulp), Carbonated Beverages, Clear Tea, Black Coffee ONLY (NO MILK, CREAM OR POWDERED CREAMER of any kind), and Gatorade    Take these medicines the morning of surgery with A SIP OF WATER: ezetimibe (ZETIA) 10 MG tablet  pantoprazole (PROTONIX) 40 MG tablet   IF NEEDED  acetaminophen (TYLENOL) 500 MG tablet    Follow your surgeon's instructions on when to stop Eliquis.  If no instructions were given by your surgeon then you will need to call the office to get those instructions.     As of today, STOP taking any Aspirin (unless otherwise instructed by your surgeon) Aleve, Naproxen, Ibuprofen, Motrin, Advil, Goody's, BC's, all herbal medications, fish oil, and all vitamins.           Do not wear jewelry  Do not wear lotions, powders, cologne or deodorant. Do not shave 48 hours prior to surgery.  Men may shave face and neck. Do not bring valuables to the hospital. Do not wear nail polish  [facility] is not responsible for any belongings or valuables.    Do NOT Smoke (Tobacco/Vaping)  24 hours prior to your procedure  If you use a CPAP at night, you may bring your mask for your overnight stay.   Contacts, glasses, hearing aids, dentures or partials may not be  worn into surgery, please bring cases for these belongings   For patients admitted to the hospital, discharge time will be determined by your treatment team.   Patients discharged the day of surgery will not be allowed to drive home, and someone needs to stay with them for 24 hours.   SURGICAL WAITING ROOM VISITATION Patients having surgery or a procedure may have no more than 2 support people in the waiting area - these visitors may rotate.   Children under the age of 82 must have an adult with them who is not the patient. If the patient needs to stay at the hospital during part of their recovery, the visitor guidelines for inpatient rooms apply. Pre-op nurse will coordinate an appropriate time for 1 support person to accompany patient in pre-op.  This support person may not rotate.   Please refer to [email or web address] for the visitor guidelines for Inpatients (after your surgery is over and you are in a regular room).    Special instructions:    Oral Hygiene is also important to reduce your risk of infection.  Remember - BRUSH YOUR TEETH THE MORNING OF SURGERY WITH YOUR REGULAR TOOTHPASTE   [facility]- Preparing For Surgery  Before surgery, you can play an important role. Because skin is not sterile, your skin needs to be as free of germs as possible. You can reduce the number of germs on your skin by washing  with CHG (chlorahexidine gluconate) Soap before surgery.  CHG is an antiseptic cleaner which kills germs and bonds with the skin to continue killing germs even after washing.     Please do not use if you have an allergy to CHG or antibacterial soaps. If your skin becomes reddened/irritated stop using the CHG.  Do not shave (including legs and underarms) for at least 48 hours prior to first CHG shower. It is OK to shave your face.  Please follow these instructions carefully.     Shower the NIGHT BEFORE SURGERY and the MORNING  OF SURGERY with CHG Soap.   If you chose to wash your hair, wash your hair first as usual with your normal shampoo. After you shampoo, rinse your hair and body thoroughly to remove the shampoo.  Then [employer] and genitals (private parts) with your normal soap and rinse thoroughly to remove soap.  After that Use CHG Soap as you would any other liquid soap. You can apply CHG directly to the skin and wash gently with a scrungie or a clean washcloth.   Apply the CHG Soap to your body ONLY FROM THE NECK DOWN.  Do not use on open wounds or open sores. Avoid contact with your eyes, ears, mouth and genitals (private parts). Wash Face and genitals (private parts)  with your normal soap.   Wash thoroughly, paying special attention to the area where your surgery will be performed.  Thoroughly rinse your body with warm water from the neck down.  DO NOT shower/wash with your normal soap after using and rinsing off the CHG Soap.  Pat yourself dry with a CLEAN TOWEL.  Wear CLEAN PAJAMAS to bed the night before surgery  Place CLEAN SHEETS on your bed the night before your surgery  DO NOT SLEEP WITH PETS.   Day of Surgery:  Take a shower with CHG soap. Wear Clean/Comfortable clothing the morning of surgery Do not apply any deodorants/lotions.   Remember to brush your teeth WITH YOUR REGULAR TOOTHPASTE.    If you received a COVID test during your pre-op visit, it is requested that you wear a mask when out in public, stay away from anyone that may not be feeling well, and notify your surgeon if you develop symptoms. If you have been in contact with anyone that has tested positive in the last 10 days, please notify your surgeon.    Please read over the following fact sheets that you were given.

## 2021-12-20 NOTE — Patient Instructions (Signed)
Shawn [name] , Thank you for taking time to come for your Medicare Wellness Visit. I appreciate your ongoing commitment to your health goals. Please review the following plan we discussed and let me know if I can assist you in the future.   These are the goals we discussed:  Goals      Patient Stated     Maintain current health     Patient Stated     Do  well after surgery this year         This is a list of the screening recommended for you and due dates:  Health Maintenance  Topic Date Due   COVID-19 Vaccine (4 - Moderna risk series) 02/01/2020   Tetanus Vaccine  01/20/2022   Medicare Annual Wellness Visit  12/21/2022   Pneumonia Vaccine  Completed   Flu Shot  Completed   Zoster (Shingles) Vaccine  Completed   HPV Vaccine  Aged Out    Advanced directives: Please bring a copy of your health care power of attorney and living will to the office at your convenience.  Conditions/risks identified: get better after surgery   Next appointment: Follow up in one year for your annual wellness visit.   Preventive Care 5 Years and Older, Male  Preventive care refers to lifestyle choices and visits with your health care provider that can promote health and wellness. What does preventive care include? A yearly physical exam. This is also called an annual well check. Dental exams once or twice a year. Routine eye exams. Ask your health care provider how often you should have your eyes checked. Personal lifestyle choices, including: Daily care of your teeth and gums. Regular physical activity. Eating a healthy diet. Avoiding tobacco and drug use. Limiting alcohol use. Practicing safe sex. Taking low doses of aspirin every day. Taking vitamin and mineral supplements as recommended by your health care provider. What happens during an annual well check? The services and screenings done by your health care provider during your annual well check will depend on your age,  overall health, lifestyle risk factors, and family history of disease. Counseling  Your health care provider may ask you questions about your: Alcohol use. Tobacco use. Drug use. Emotional well-being. Home and relationship well-being. Sexual activity. Eating habits. History of falls. Memory and ability to understand (cognition). Work and work Statistician. Screening  You may have the following tests or measurements: Height, weight, and BMI. Blood pressure. Lipid and cholesterol levels. These may be checked every 5 years, or more frequently if you are over 108 years old. Skin check. Lung cancer screening. You may have this screening every year starting at age 61 if you have a 30-pack-year history of smoking and currently smoke or have quit within the past 15 years. Fecal occult blood test (FOBT) of the stool. You may have this test every year starting at age 69. Flexible sigmoidoscopy or colonoscopy. You may have a sigmoidoscopy every 5 years or a colonoscopy every 10 years starting at age 56. Prostate cancer screening. Recommendations will vary depending on your family history and other risks. Hepatitis C blood test. Hepatitis B blood test. Sexually transmitted disease (STD) testing. Diabetes screening. This is done by checking your blood sugar (glucose) after you have not eaten for a while (fasting). You may have this done every 1-3 years. Abdominal aortic aneurysm (AAA) screening. You may need this if you are a current or former smoker. Osteoporosis. You may be screened starting at age 48  if you are at high risk. Talk with your health care provider about your test results, treatment options, and if necessary, the need for more tests. Vaccines  Your health care provider may recommend certain vaccines, such as: Influenza vaccine. This is recommended every year. Tetanus, diphtheria, and acellular pertussis (Tdap, Td) vaccine. You may need a Td booster every 10 years. Zoster vaccine.  You may need this after age 43. Pneumococcal 13-valent conjugate (PCV13) vaccine. One dose is recommended after age 30. Pneumococcal polysaccharide (PPSV23) vaccine. One dose is recommended after age 39. Talk to your health care provider about which screenings and vaccines you need and how often you need them. This information is not intended to replace advice given to you by your health care provider. Make sure you discuss any questions you have with your health care provider. Document Released: 02/18/2015 Document Revised: 10/12/2015 Document Reviewed: 11/23/2014 Elsevier Interactive Patient Education  2017 [place] Prevention in the Home Falls can cause injuries. They can happen to people of all ages. There are many things you can do to make your home safe and to help prevent falls. What can I do on the outside of my home? Regularly fix the edges of walkways and driveways and fix any cracks. Remove anything that might make you trip as you walk through a door, such as a raised step or threshold. Trim any bushes or trees on the path to your home. Use bright outdoor lighting. Clear any walking paths of anything that might make someone trip, such as rocks or tools. Regularly check to see if handrails are loose or broken. Make sure that both sides of any steps have handrails. Any raised decks and porches should have guardrails on the edges. Have any leaves, snow, or ice cleared regularly. Use sand or salt on walking paths during winter. Clean up any spills in your garage right away. This includes oil or grease spills. What can I do in the bathroom? Use night lights. Install grab bars by the toilet and in the tub and shower. Do not use towel bars as grab bars. Use non-skid mats or decals in the tub or shower. If you need to sit down in the shower, use a plastic, non-slip stool. Keep the floor dry. Clean up any water that spills on the floor as soon as it happens. Remove soap  buildup in the tub or shower regularly. Attach bath mats securely with double-sided non-slip rug tape. Do not have throw rugs and other things on the floor that can make you trip. What can I do in the bedroom? Use night lights. Make sure that you have a light by your bed that is easy to reach. Do not use any sheets or blankets that are too big for your bed. They should not hang down onto the floor. Have a firm chair that has side arms. You can use this for support while you get dressed. Do not have throw rugs and other things on the floor that can make you trip. What can I do in the kitchen? Clean up any spills right away. Avoid walking on wet floors. Keep items that you use a lot in easy-to-reach places. If you need to reach something above you, use a strong step stool that has a grab bar. Keep electrical cords out of the way. Do not use floor polish or wax that makes floors slippery. If you must use wax, use non-skid floor wax. Do not have throw rugs and other things  on the floor that can make you trip. What can I do with my stairs? Do not leave any items on the stairs. Make sure that there are handrails on both sides of the stairs and use them. Fix handrails that are broken or loose. Make sure that handrails are as long as the stairways. Check any carpeting to make sure that it is firmly attached to the stairs. Fix any carpet that is loose or worn. Avoid having throw rugs at the top or bottom of the stairs. If you do have throw rugs, attach them to the floor with carpet tape. Make sure that you have a light switch at the top of the stairs and the bottom of the stairs. If you do not have them, ask someone to add them for you. What else can I do to help prevent falls? Wear shoes that: Do not have high heels. Have rubber bottoms. Are comfortable and fit you well. Are closed at the toe. Do not wear sandals. If you use a stepladder: Make sure that it is fully opened. Do not climb a closed  stepladder. Make sure that both sides of the stepladder are locked into place. Ask someone to hold it for you, if possible. Clearly mark and make sure that you can see: Any grab bars or handrails. First and last steps. Where the edge of each step is. Use tools that help you move around (mobility aids) if they are needed. These include: Canes. Walkers. Scooters. Crutches. Turn on the lights when you go into a dark area. Replace any light bulbs as soon as they burn out. Set up your furniture so you have a clear path. Avoid moving your furniture around. If any of your floors are uneven, fix them. If there are any pets around you, be aware of where they are. Review your medicines with your doctor. Some medicines can make you feel dizzy. This can increase your chance of falling. Ask your doctor what other things that you can do to help prevent falls. This information is not intended to replace advice given to you by your health care provider. Make sure you discuss any questions you have with your health care provider. Document Released: 11/18/2008 Document Revised: 06/30/2015 Document Reviewed: 02/26/2014 Elsevier Interactive Patient Education  2017 [employer].

## 2021-12-21 ENCOUNTER — Encounter (HOSPITAL_COMMUNITY): Payer: Self-pay

## 2021-12-21 ENCOUNTER — Encounter (HOSPITAL_COMMUNITY)
Admission: RE | Admit: 2021-12-21 | Discharge: 2021-12-21 | Disposition: A | Discharge status: Home / Self Care | Payer: Medicare HMO | Source: Ambulatory Visit | Attending: General Surgery | Admitting: General Surgery

## 2021-12-21 ENCOUNTER — Other Ambulatory Visit: Payer: Self-pay

## 2021-12-21 VITALS — BP 141/73 | HR 67 | Temp 97.4°F | Resp 18 | Ht 67.0 in | Wt 166.4 lb

## 2021-12-21 DIAGNOSIS — Z7901 Long term (current) use of anticoagulants: Secondary | ICD-10-CM | POA: Diagnosis not present

## 2021-12-21 DIAGNOSIS — Z86711 Personal history of pulmonary embolism: Secondary | ICD-10-CM | POA: Insufficient documentation

## 2021-12-21 DIAGNOSIS — Z01818 Encounter for other preprocedural examination: Secondary | ICD-10-CM

## 2021-12-21 DIAGNOSIS — K4031 Unilateral inguinal hernia, with obstruction, without gangrene, recurrent: Secondary | ICD-10-CM | POA: Insufficient documentation

## 2021-12-21 DIAGNOSIS — Z86718 Personal history of other venous thrombosis and embolism: Secondary | ICD-10-CM | POA: Insufficient documentation

## 2021-12-21 DIAGNOSIS — Z01812 Encounter for preprocedural laboratory examination: Secondary | ICD-10-CM | POA: Insufficient documentation

## 2021-12-21 DIAGNOSIS — E785 Hyperlipidemia, unspecified: Secondary | ICD-10-CM | POA: Diagnosis not present

## 2021-12-21 DIAGNOSIS — I700 Atherosclerosis of aorta: Secondary | ICD-10-CM | POA: Insufficient documentation

## 2021-12-21 DIAGNOSIS — D6851 Activated protein C resistance: Secondary | ICD-10-CM | POA: Insufficient documentation

## 2021-12-21 LAB — CBC
HCT: 49.8 % (ref 39.0–52.0)
Hemoglobin: 17.4 g/dL — ABNORMAL HIGH (ref 13.0–17.0)
MCH: 34.5 pg — ABNORMAL HIGH (ref 26.0–34.0)
MCHC: 34.9 g/dL (ref 30.0–36.0)
MCV: 98.6 fL (ref 80.0–100.0)
Platelets: 144 10*3/uL — ABNORMAL LOW (ref 150–400)
RBC: 5.05 MIL/uL (ref 4.22–5.81)
RDW: 12 % (ref 11.5–15.5)
WBC: 4.2 10*3/uL (ref 4.0–10.5)
nRBC: 0 % (ref 0.0–0.2)

## 2021-12-21 LAB — BASIC METABOLIC PANEL
Anion gap: 5 (ref 5–15)
BUN: 16 mg/dL (ref 8–23)
CO2: 32 mmol/L (ref 22–32)
Calcium: 9.5 mg/dL (ref 8.9–10.3)
Chloride: 104 mmol/L (ref 98–111)
Creatinine, Ser: 1.07 mg/dL (ref 0.61–1.24)
GFR, Estimated: >60 mL/min (ref >60)
Glucose, Bld: 94 mg/dL (ref 70–99)
Potassium: 4 mmol/L (ref 3.5–5.1)
Sodium: 141 mmol/L (ref 135–145)

## 2021-12-21 NOTE — Pre-Procedure Instructions (Signed)
Surgical Instructions    Your procedure is scheduled on Friday December 1st.  Report to [facility] Main Entrance "A" at 1 P.M., then check in with the Admitting office.  Call this number if you have problems the morning of surgery:  [phone]   If you have any questions prior to your surgery date call [phone]: Open Monday-Friday 8am-4pm If you experience any cold or flu symptoms such as cough, fever, chills, shortness of breath, etc. between now and your scheduled surgery, please notify us at the above number     Remember:  Do not eat after midnight the night before your surgery  You may drink clear liquids until 12pm the afternoon of your surgery.   Clear liquids allowed are: Water, Non-Citrus Juices (without pulp), Carbonated Beverages, Clear Tea, Black Coffee ONLY (NO MILK, CREAM OR POWDERED CREAMER of any kind), and Gatorade    Take these medicines the morning of surgery with A SIP OF WATER: ezetimibe (ZETIA) pantoprazole (PROTONIX) donepezil (ARICEPT)  IF NEEDED  acetaminophen (TYLENOL)   Follow your surgeon's instructions on when to stop Eliquis.  If no instructions were given by your surgeon then you will need to call the office to get those instructions.     As of today, STOP taking any Aspirin (unless otherwise instructed by your surgeon) Aleve, Naproxen, Ibuprofen, Motrin, Advil, Goody's, BC's, all herbal medications, fish oil, and all vitamins.  Do NOT Smoke (Tobacco/Vaping)  24 hours prior to your procedure  If you use a CPAP at night, you may bring your mask for your overnight stay.   Contacts, glasses, hearing aids, dentures or partials may not be worn into surgery, please bring cases for these belongings   For patients admitted to the hospital, discharge time will be determined by your treatment team.   Patients discharged the day of surgery will not be allowed to drive home, and someone needs to stay with them for 24 hours.   SURGICAL WAITING ROOM  VISITATION Patients having surgery or a procedure may have no more than 2 support people in the waiting area - these visitors may rotate.   Children under the age of 60 must have an adult with them who is not the patient. If the patient needs to stay at the hospital during part of their recovery, the visitor guidelines for inpatient rooms apply. Pre-op nurse will coordinate an appropriate time for 1 support person to accompany patient in pre-op.  This support person may not rotate.   Please refer to [email or web address] for the visitor guidelines for Inpatients (after your surgery is over and you are in a regular room).    Special instructions:    Oral Hygiene is also important to reduce your risk of infection.  Remember - BRUSH YOUR TEETH THE MORNING OF SURGERY WITH YOUR REGULAR TOOTHPASTE   [place]- Preparing For Surgery  Before surgery, you can play an important role. Because skin is not sterile, your skin needs to be as free of germs as possible. You can reduce the number of germs on your skin by washing with CHG (chlorahexidine gluconate) Soap before surgery.  CHG is an antiseptic cleaner which kills germs and bonds with the skin to continue killing germs even after washing.     Please do not use if you have an allergy to CHG or antibacterial soaps. If your skin becomes reddened/irritated stop using the CHG.  Do not shave (including legs and underarms) for at least 48 hours prior to first CHG shower.  It is OK to shave your face.  Please follow these instructions carefully.     Shower the NIGHT BEFORE SURGERY and the MORNING OF SURGERY with CHG Soap.   If you chose to wash your hair, wash your hair first as usual with your normal shampoo. After you shampoo, rinse your hair and body thoroughly to remove the shampoo.  Then [employer] and genitals (private parts) with your normal soap and rinse thoroughly to remove soap.  After that Use  CHG Soap as you would any other liquid soap. You can apply CHG directly to the skin and wash gently with a scrungie or a clean washcloth.   Apply the CHG Soap to your body ONLY FROM THE NECK DOWN.  Do not use on open wounds or open sores. Avoid contact with your eyes, ears, mouth and genitals (private parts). Wash Face and genitals (private parts)  with your normal soap.   Wash thoroughly, paying special attention to the area where your surgery will be performed.  Thoroughly rinse your body with warm water from the neck down.  DO NOT shower/wash with your normal soap after using and rinsing off the CHG Soap.  Pat yourself dry with a CLEAN TOWEL.  Wear CLEAN PAJAMAS to bed the night before surgery  Place CLEAN SHEETS on your bed the night before your surgery  DO NOT SLEEP WITH PETS.   Day of Surgery:  Take a shower with CHG soap. Do not wear jewelry  Do not wear lotions, powders, cologne or deodorant. Men may shave face and neck. Do not bring valuables to the hospital. Do not bring valuables to the hospital. [facility] is not responsible for any belongings or valuables.   Wear Clean/Comfortable clothing the morning of surgery Do not apply any deodorants/lotions.   Remember to brush your teeth WITH YOUR REGULAR TOOTHPASTE.    If you received a COVID test during your pre-op visit, it is requested that you wear a mask when out in public, stay away from anyone that may not be feeling well, and notify your surgeon if you develop symptoms. If you have been in contact with anyone that has tested positive in the last 10 days, please notify your surgeon.    Please read over the following fact sheets that you were given.

## 2021-12-21 NOTE — Progress Notes (Signed)
PCP - Dr. Roma Schanz Cardiologist - Dr. Minus Breeding  PPM/ICD - Denies  Chest x-ray - N/A EKG - 08/10/21 Stress Test - Denies ECHO - Denies Cardiac Cath - Denies  Sleep Study - Denies  Diabetes: Denies  Blood Thinner Instructions: Follow surgeon's instructions Aspirin Instructions: N/A  ERAS Protcol - Yes PRE-SURGERY Ensure or G2- No  COVID TEST- N/A   Anesthesia review: Yes, clotting disorder  Patient denies shortness of breath, fever, cough and chest pain at PAT appointment   All instructions explained to the patient, with a verbal understanding of the material. Patient agrees to go over the instructions while at home for a better understanding. Patient also instructed to self quarantine after being tested for COVID-19. The opportunity to ask questions was provided.

## 2021-12-22 ENCOUNTER — Encounter (HOSPITAL_COMMUNITY): Payer: Self-pay

## 2021-12-22 NOTE — Progress Notes (Signed)
Anesthesia Chart Review:  Case: [id number] Date/Time: 01/05/22 1445   Procedure: RIGHT HERNIA REPAIR RECURRENT INGUINAL WITH MESH (Right) - GEN AND TAP BLOCK   Anesthesia type: General   Pre-op diagnosis: RECURRENT RIGHT INGUINAL HERNIA REPAIR   Location: [place] OR ROOM 02 / [place] [facility]   Surgeons: Georganna Skeans, MD       DISCUSSION: Patient is an 87 year old male scheduled for the above procedure.  He was referred to general surgery by his PCP Ann Held, DO. ED visit 11/10/21 for incarcerated right inguinal hernia, s/p manual reduction.   History includes never smoker, factor V Leiden mutation, RLE DVT/PE (11/20/09), murmur (moderate AS, mean gradient 18.2 mmHg, peak gradient 28.9 mmHg), HLD, dysphagia, dementia, osteoarthritis, inguinal hernia (repair ~ age 34), varicose vein surgery.  Last cardiology visit with Dr. Percival Spanish was on 04/19/20. History of near syncope July 2021 with no new changes on MRI. No recurrent symptoms. No chest pain or new SOB. No palpitations. BP controlled. LDL elevated, but previously intolerant to pravastatin and conservative management planned. Echo repeated for follow-up aortic sclerosis and showed mild. Two year cardiology follow-up recommended. Of note, he has had a more recent echo during July 2023 admission for possible TIA. Echo showed moderate AS.   Last neurology visit was on 08/22/21 with Sharene Butters, PA-C for dementia follow-up. Admitted 08/09/21-08/11/21 with RUE ataxia, gait instability and possible TIA. No CVA by MRI, so left brain TIA suspected (cryptogenic source versus due factor V Leiden mutation).  No significant carotid stenosis by CTA.  Echo results as below.  Eliquis continued and recommended initiating trial of another statin, rosuvastatin. Referred to hematology for Factor V deficiency with history of DVT/PE and possible TIA (on Eliquis). Neurology follow-up in six months.  Hematology evaluation by Lottie Dawson, NP on 08/25/21.  Hypercoagulable labs  ordered to evaluate for any additional underlying coagulation issues. Per 09/05/21 notation, no new findings other than known Factor V Leiden, so no changes made to his Eliquis regimen.   Dr. Grandville Silos was reaching out to hematology for perioperative Eliquis instructions. Will leave chart for follow-up.    VS: BP (!) 141/73   Pulse 67   Temp (!) 36.3 C (Oral)   Resp 18   Ht '5\' 7"'$  (1.702 m)   Wt 75.5 kg   SpO2 99%   BMI 26.06 kg/m    PROVIDERS: Ann Held, DO is PCP  Minus Breeding, MD is cardiologist Ellouise Newer, MD is neurologist Burney Gauze, MD is HEM   LABS: Labs reviewed: Acceptable for surgery. (all labs ordered are listed, but only abnormal results are displayed)  Labs Reviewed  CBC - Abnormal; Notable for the following components:      Result Value   Hemoglobin 17.4 (*)    MCH 34.5 (*)    Platelets 144 (*)    All other components within normal limits  BASIC METABOLIC PANEL     IMAGES: CT Abd/pelvis 11/10/21: IMPRESSION: 1. Fat containing right inguinal hernia, with stranding of the herniated fat consistent with incarcerated hernia. No bowel or bladder herniation at this time. 2. Mild distension of the mid to distal jejunum with scattered gas fluid levels, favor ileus over early/intermittent obstruction. 3.  Aortic Atherosclerosis (ICD10-I70.0). 4. Enlarged prostate, with small bladder diverticulum likely signifying an element of chronic bladder outlet obstruction.   MRI Brain 08/10/21: IMPRESSION: 1. No acute intracranial abnormality. 2. Findings of chronic ischemic microangiopathy and generalized volume loss.  CTA head/neck 08/09/21: IMPRESSION:  1. Negative for intracranial large vessel occlusion or significant stenosis 2. No significant carotid or vertebral artery stenosis in the neck. Mild atherosclerotic disease of the carotid bifurcation bilaterally.   CT Chest 08/03/21: IMPRESSION: 1. No evidence of significant acute traumatic  injury to the thorax. 2. The appearance of the lungs may suggest interstitial lung disease. Outpatient referral to Pulmonology for further clinical evaluation is recommended. At this time, imaging findings are considered indeterminate for usual interstitial pneumonia (UIP) per current ATS guidelines. 3. Aortic atherosclerosis, in addition to left main and three-vessel coronary artery disease. 4. There are severe calcifications of the aortic valve. Echocardiographic correlation for evaluation of potential valvular dysfunction may be warranted if clinically indicated. 5. Additional incidental findings, as above.    EKG: 08/09/21: Sinus rhythm Atrial premature complexes in couplets Abnormal R-wave progression, early transition Confirmed by Addison Lank [phone]) on 08/10/2021 6:15:31 PM   CV: Echo 08/11/21: IMPRESSIONS   1. Left ventricular ejection fraction, by estimation, is 60 to 65%. The left ventricle has normal function. The left ventricle has no regional wall motion abnormalities. Left ventricular diastolic parameters are indeterminate.   2. Right ventricular systolic function is normal. The right ventricular size is normal. There is normal pulmonary artery systolic pressure. The estimated right ventricular systolic pressure is 51.8 mmHg.   3. The mitral valve is normal in structure. No evidence of mitral valve regurgitation. No evidence of mitral stenosis.   4. The aortic valve was not well visualized. Aortic valve regurgitation is trivial. Moderate aortic valve stenosis. Vmax 2.9 m/s, MG 25 mmHg, AVA 1.3 cm^2, DI 0.5.  Aortic valve mean gradient measures 18.2 mmHg. Aortic valve peak gradient measures 28.9 mmHg. Aortic valve area, by VTI measures 1.46 cm.   5. Aortic dilatation noted. There is mild dilatation of the ascending aorta, measuring 38 mm.   6. The inferior vena cava is normal in size with greater than 50% respiratory variability, suggesting right atrial pressure of 3 mmHg.     ETT 10/20/12: ETT Interpretation:  normal - no evidence of ischemia by ST analysis    Past Medical History:  Diagnosis Date   Allergy    Cardiac murmur    aortic sclerosis; moderate AS 08/11/21   Cataract    left eye   Clotting disorder (HCC)    Factor v Leiden factor mutation   Colon polyps    Hyperplastic   Diverticulosis    Dysphagia    Factor V Leiden mutation ([place])    Hemorrhoids    History of blood clots    Hyperlipidemia    Osteoarthritis    Pulmonary embolism ([place]) 11/20/2009    Past Surgical History:  Procedure Laterality Date   APPENDECTOMY     CATARACT EXTRACTION  02/09/2007   right eye   INGUINAL HERNIA REPAIR     TONSILLECTOMY     VARICOSE VEIN SURGERY     VASECTOMY      MEDICATIONS:  acetaminophen (TYLENOL) 500 MG tablet   ascorbic acid (VITAMIN C) 500 MG tablet   Cholecalciferol 125 MCG (5000 UT) TABS   donepezil (ARICEPT) 10 MG tablet   ELIQUIS 5 MG TABS tablet   ezetimibe (ZETIA) 10 MG tablet   Multiple Vitamin (MULTIVITAMIN WITH MINERALS) TABS tablet   pantoprazole (PROTONIX) 40 MG tablet   saw palmetto 160 MG capsule   vitamin B-12 (CYANOCOBALAMIN) 1000 MCG tablet   No current facility-administered medications for this encounter.    Myra Gianotti, PA-C Surgical Short Stay/Anesthesiology [facility] Phone [phone] [facility] Phone [phone] 12/22/2021 7:21 PM

## 2021-12-25 NOTE — Anesthesia Preprocedure Evaluation (Addendum)
Anesthesia Evaluation  Patient identified by MRN, date of birth, ID band Patient awake    Reviewed: Allergy & Precautions, NPO status , Patient's Chart, lab work & pertinent test results  Airway Mallampati: III  TM Distance: >3 FB Neck ROM: Full  Mouth opening: Limited Mouth Opening  Dental  (+) Partial Upper, Dental Advisory Given   Pulmonary neg pulmonary ROS   Pulmonary exam normal breath sounds clear to auscultation       Cardiovascular Normal cardiovascular exam+ Valvular Problems/Murmurs (mod AS) AS  Rhythm:Regular Rate:Normal  Echo 08/11/21: IMPRESSIONS  1. Left ventricular ejection fraction, by estimation, is 60 to 65%. The left ventricle has normal function. The left ventricle has no regional wall motion abnormalities. Left ventricular diastolic parameters are indeterminate.  2. Right ventricular systolic function is normal. The right ventricular size is normal. There is normal pulmonary artery systolic pressure. The estimated right ventricular systolic pressure is 27.7 mmHg.  3. The mitral valve is normal in structure. No evidence of mitral valve regurgitation. No evidence of mitral stenosis.  4. The aortic valve was not well visualized. Aortic valve regurgitation is trivial. Moderate aortic valve stenosis. Vmax 2.9 m/s, MG 25 mmHg, AVA 1.3 cm^2, DI 0.5.  Aortic valve mean gradient measures 18.2 mmHg. Aortic valve peak gradient measures 28.9 mmHg. Aortic valve area, by VTI measures 1.46 cm.  5. Aortic dilatation noted. There is mild dilatation of the ascending aorta, measuring 38 mm.  6. The inferior vena cava is normal in size with greater than 50% respiratory variability, suggesting right atrial pressure of 3 mmHg.   Neuro/Psych  PSYCHIATRIC DISORDERS Anxiety Depression   Dementia negative neurological ROS     GI/Hepatic negative GI ROS, Neg liver ROS,,,  Endo/Other  negative endocrine ROS    Renal/GU negative Renal  ROS  negative genitourinary   Musculoskeletal  (+) Arthritis ,    Abdominal   Peds  Hematology  (+) Blood dyscrasia (Factor 5 Leiden, eliquis, last dose 5 days ago)   Anesthesia Other Findings History includes never smoker, factor V Leiden mutation, RLE DVT/PE (11/20/09), murmur (moderate AS, mean gradient 18.2 mmHg, peak gradient 28.9 mmHg), HLD, dysphagia, dementia, osteoarthritis, inguinal hernia (repair ~ age 3), varicose vein surgery.  Reproductive/Obstetrics                             Anesthesia Physical Anesthesia Plan  ASA: 3  Anesthesia Plan: General and Regional   Post-op Pain Management: Regional block* and Tylenol PO (pre-op)*   Induction: Intravenous  PONV Risk Score and Plan: 2 and Dexamethasone, Ondansetron and Treatment may vary due to age or medical condition  Airway Management Planned: Oral ETT  Additional Equipment:   Intra-op Plan:   Post-operative Plan: Extubation in OR  Informed Consent: I have reviewed the patients History and Physical, chart, labs and discussed the procedure including the risks, benefits and alternatives for the proposed anesthesia with the patient or authorized representative who has indicated his/her understanding and acceptance.     Dental advisory given  Plan Discussed with: CRNA  Anesthesia Plan Comments: (PAT note written 12/25/2021 by Myra Gianotti, PA-C.  )       Anesthesia Quick Evaluation

## 2022-01-05 ENCOUNTER — Ambulatory Visit (HOSPITAL_COMMUNITY)
Admission: RE | Admit: 2022-01-05 | Discharge: 2022-01-05 | Disposition: A | Discharge status: Home / Self Care | Payer: Medicare HMO | Attending: General Surgery | Admitting: General Surgery

## 2022-01-05 ENCOUNTER — Encounter (HOSPITAL_COMMUNITY)
Admission: RE | Disposition: A | Discharge status: Home / Self Care | Payer: Self-pay | Source: Home / Self Care | Attending: General Surgery

## 2022-01-05 ENCOUNTER — Ambulatory Visit (HOSPITAL_BASED_OUTPATIENT_CLINIC_OR_DEPARTMENT_OTHER): Payer: Medicare HMO | Admitting: Anesthesiology

## 2022-01-05 ENCOUNTER — Encounter (HOSPITAL_COMMUNITY): Payer: Self-pay | Admitting: General Surgery

## 2022-01-05 ENCOUNTER — Other Ambulatory Visit: Payer: Self-pay

## 2022-01-05 ENCOUNTER — Ambulatory Visit (HOSPITAL_COMMUNITY): Payer: Medicare HMO | Admitting: Vascular Surgery

## 2022-01-05 DIAGNOSIS — G8918 Other acute postprocedural pain: Secondary | ICD-10-CM | POA: Diagnosis not present

## 2022-01-05 DIAGNOSIS — E785 Hyperlipidemia, unspecified: Secondary | ICD-10-CM | POA: Insufficient documentation

## 2022-01-05 DIAGNOSIS — K4091 Unilateral inguinal hernia, without obstruction or gangrene, recurrent: Secondary | ICD-10-CM

## 2022-01-05 DIAGNOSIS — D6851 Activated protein C resistance: Secondary | ICD-10-CM | POA: Insufficient documentation

## 2022-01-05 DIAGNOSIS — Z7901 Long term (current) use of anticoagulants: Secondary | ICD-10-CM | POA: Insufficient documentation

## 2022-01-05 DIAGNOSIS — M1990 Unspecified osteoarthritis, unspecified site: Secondary | ICD-10-CM | POA: Diagnosis not present

## 2022-01-05 HISTORY — PX: INGUINAL HERNIA REPAIR: SHX194

## 2022-01-05 SURGERY — REPAIR, HERNIA, INGUINAL, ADULT
Anesthesia: Regional | Site: Groin | Laterality: Right

## 2022-01-05 MED ORDER — FENTANYL CITRATE (PF) 100 MCG/2ML IJ SOLN
INTRAMUSCULAR | Status: AC
  Administered 2022-01-05: 100 ug via INTRAVENOUS
  Filled 2022-01-05: qty 2

## 2022-01-05 MED ORDER — CEFAZOLIN SODIUM-DEXTROSE 2-4 GM/100ML-% IV SOLN
2.0000 g | INTRAVENOUS | Status: AC
  Administered 2022-01-05: 2 g via INTRAVENOUS
  Filled 2022-01-05: qty 100

## 2022-01-05 MED ORDER — PROPOFOL 10 MG/ML IV BOLUS
INTRAVENOUS | Status: DC | PRN
  Administered 2022-01-05: 30 mg via INTRAVENOUS
  Administered 2022-01-05: 80 mg via INTRAVENOUS

## 2022-01-05 MED ORDER — LACTATED RINGERS IV SOLN: INTRAVENOUS | Status: DC

## 2022-01-05 MED ORDER — FENTANYL CITRATE (PF) 100 MCG/2ML IJ SOLN
INTRAMUSCULAR | Status: AC
  Filled 2022-01-05: qty 2

## 2022-01-05 MED ORDER — ORAL CARE MOUTH RINSE: 15.0000 mL | Freq: Once | OROMUCOSAL | Status: AC

## 2022-01-05 MED ORDER — ONDANSETRON HCL 4 MG/2ML IJ SOLN
INTRAMUSCULAR | Status: AC
  Filled 2022-01-05: qty 2

## 2022-01-05 MED ORDER — DEXAMETHASONE SODIUM PHOSPHATE 10 MG/ML IJ SOLN
INTRAMUSCULAR | Status: DC | PRN
  Administered 2022-01-05: 5 mg

## 2022-01-05 MED ORDER — DEXAMETHASONE SODIUM PHOSPHATE 10 MG/ML IJ SOLN
INTRAMUSCULAR | Status: AC
  Filled 2022-01-05: qty 1

## 2022-01-05 MED ORDER — ROPIVACAINE HCL 5 MG/ML IJ SOLN
INTRAMUSCULAR | Status: DC | PRN
  Administered 2022-01-05: 30 mL via PERINEURAL

## 2022-01-05 MED ORDER — ACETAMINOPHEN 500 MG PO TABS
1000.0000 mg | ORAL_TABLET | ORAL | Status: AC
  Administered 2022-01-05: 1000 mg via ORAL
  Filled 2022-01-05: qty 2

## 2022-01-05 MED ORDER — DEXAMETHASONE SODIUM PHOSPHATE 10 MG/ML IJ SOLN
INTRAMUSCULAR | Status: DC | PRN
  Administered 2022-01-05: 5 mg via INTRAVENOUS

## 2022-01-05 MED ORDER — LIDOCAINE 2% (20 MG/ML) 5 ML SYRINGE
INTRAMUSCULAR | Status: AC
  Filled 2022-01-05: qty 5

## 2022-01-05 MED ORDER — PROPOFOL 10 MG/ML IV BOLUS
INTRAVENOUS | Status: AC
  Filled 2022-01-05: qty 20

## 2022-01-05 MED ORDER — PHENYLEPHRINE 80 MCG/ML (10ML) SYRINGE FOR IV PUSH (FOR BLOOD PRESSURE SUPPORT)
PREFILLED_SYRINGE | INTRAVENOUS | Status: DC | PRN
  Administered 2022-01-05: 160 ug via INTRAVENOUS
  Administered 2022-01-05 (×4): 80 ug via INTRAVENOUS
  Administered 2022-01-05: 160 ug via INTRAVENOUS
  Administered 2022-01-05: 80 ug via INTRAVENOUS

## 2022-01-05 MED ORDER — LIDOCAINE 2% (20 MG/ML) 5 ML SYRINGE
INTRAMUSCULAR | Status: DC | PRN
  Administered 2022-01-05: 20 mg via INTRAVENOUS

## 2022-01-05 MED ORDER — CHLORHEXIDINE GLUCONATE 0.12 % MT SOLN
15.0000 mL | Freq: Once | OROMUCOSAL | Status: AC
  Administered 2022-01-05: 15 mL via OROMUCOSAL
  Filled 2022-01-05: qty 15

## 2022-01-05 MED ORDER — CHLORHEXIDINE GLUCONATE CLOTH 2 % EX PADS: 6.0000 | MEDICATED_PAD | Freq: Once | CUTANEOUS | Status: DC

## 2022-01-05 MED ORDER — BUPIVACAINE-EPINEPHRINE (PF) 0.25% -1:200000 IJ SOLN
INTRAMUSCULAR | Status: AC
  Filled 2022-01-05: qty 30

## 2022-01-05 MED ORDER — FENTANYL CITRATE (PF) 250 MCG/5ML IJ SOLN
INTRAMUSCULAR | Status: DC | PRN
  Administered 2022-01-05: 50 ug via INTRAVENOUS

## 2022-01-05 MED ORDER — ROCURONIUM BROMIDE 10 MG/ML (PF) SYRINGE
PREFILLED_SYRINGE | INTRAVENOUS | Status: AC
  Filled 2022-01-05: qty 10

## 2022-01-05 MED ORDER — PHENYLEPHRINE 80 MCG/ML (10ML) SYRINGE FOR IV PUSH (FOR BLOOD PRESSURE SUPPORT)
PREFILLED_SYRINGE | INTRAVENOUS | Status: AC
  Filled 2022-01-05: qty 20

## 2022-01-05 MED ORDER — FENTANYL CITRATE (PF) 100 MCG/2ML IJ SOLN
25.0000 ug | INTRAMUSCULAR | Status: DC | PRN
  Administered 2022-01-05: 25 ug via INTRAVENOUS

## 2022-01-05 MED ORDER — FENTANYL CITRATE (PF) 100 MCG/2ML IJ SOLN: 100.0000 ug | Freq: Once | INTRAMUSCULAR | Status: AC

## 2022-01-05 MED ORDER — FENTANYL CITRATE (PF) 250 MCG/5ML IJ SOLN
INTRAMUSCULAR | Status: AC
  Filled 2022-01-05: qty 5

## 2022-01-05 MED ORDER — MIDAZOLAM HCL 2 MG/2ML IJ SOLN
INTRAMUSCULAR | Status: AC
  Filled 2022-01-05: qty 2

## 2022-01-05 MED ORDER — OXYCODONE HCL 5 MG PO TABS: 5.0000 mg | ORAL_TABLET | Freq: Four times a day (QID) | ORAL | 0 refills | Status: DC | PRN

## 2022-01-05 MED ORDER — SUGAMMADEX SODIUM 200 MG/2ML IV SOLN
INTRAVENOUS | Status: DC | PRN
  Administered 2022-01-05: 200 mg via INTRAVENOUS

## 2022-01-05 MED ORDER — BUPIVACAINE-EPINEPHRINE 0.25% -1:200000 IJ SOLN
INTRAMUSCULAR | Status: DC | PRN
  Administered 2022-01-05: 10 mL

## 2022-01-05 MED ORDER — ONDANSETRON HCL 4 MG/2ML IJ SOLN
INTRAMUSCULAR | Status: DC | PRN
  Administered 2022-01-05: 4 mg via INTRAVENOUS

## 2022-01-05 MED ORDER — PHENYLEPHRINE 80 MCG/ML (10ML) SYRINGE FOR IV PUSH (FOR BLOOD PRESSURE SUPPORT)
PREFILLED_SYRINGE | INTRAVENOUS | Status: AC
  Filled 2022-01-05: qty 10

## 2022-01-05 MED ORDER — 0.9 % SODIUM CHLORIDE (POUR BTL) OPTIME
TOPICAL | Status: DC | PRN
  Administered 2022-01-05: 1000 mL

## 2022-01-05 MED ORDER — ROCURONIUM BROMIDE 10 MG/ML (PF) SYRINGE
PREFILLED_SYRINGE | INTRAVENOUS | Status: DC | PRN
  Administered 2022-01-05: 70 mg via INTRAVENOUS

## 2022-01-05 SURGICAL SUPPLY — 36 items
ADH SKN CLS APL DERMABOND .7 (GAUZE/BANDAGES/DRESSINGS) ×1
APL PRP STRL LF DISP 70% ISPRP (MISCELLANEOUS) ×1
BAG COUNTER SPONGE SURGICOUNT (BAG) ×2 IMPLANT
BAG SPNG CNTER NS LX DISP (BAG) ×1
BLADE CLIPPER SURG (BLADE) IMPLANT
CHLORAPREP W/TINT 26 (MISCELLANEOUS) ×2 IMPLANT
COVER SURGICAL LIGHT HANDLE (MISCELLANEOUS) ×2 IMPLANT
DERMABOND ADVANCED .7 DNX12 (GAUZE/BANDAGES/DRESSINGS) ×2 IMPLANT
DRAIN PENROSE 1/2X12 LTX STRL (WOUND CARE) IMPLANT
DRAPE LAPAROTOMY 100X72 PEDS (DRAPES) ×2 IMPLANT
ELECT REM PT RETURN 9FT ADLT (ELECTROSURGICAL) ×1
ELECTRODE REM PT RTRN 9FT ADLT (ELECTROSURGICAL) ×2 IMPLANT
GLOVE BIO SURGEON STRL SZ8 (GLOVE) ×2 IMPLANT
GLOVE BIOGEL PI IND STRL 8 (GLOVE) ×2 IMPLANT
GOWN STRL REUS W/ TWL LRG LVL3 (GOWN DISPOSABLE) ×2 IMPLANT
GOWN STRL REUS W/ TWL XL LVL3 (GOWN DISPOSABLE) ×2 IMPLANT
GOWN STRL REUS W/TWL LRG LVL3 (GOWN DISPOSABLE) ×3
GOWN STRL REUS W/TWL XL LVL3 (GOWN DISPOSABLE) ×1
KIT BASIN OR (CUSTOM PROCEDURE TRAY) ×2 IMPLANT
KIT TURNOVER KIT B (KITS) ×2 IMPLANT
MESH HERNIA 3X6 (Mesh General) IMPLANT
NDL 22X1.5 STRL (OR ONLY) (MISCELLANEOUS) ×2 IMPLANT
NEEDLE 22X1.5 STRL (OR ONLY) (MISCELLANEOUS) ×1 IMPLANT
NS IRRIG 1000ML POUR BTL (IV SOLUTION) ×2 IMPLANT
PACK GENERAL/GYN (CUSTOM PROCEDURE TRAY) ×2 IMPLANT
PAD ARMBOARD 7.5X6 YLW CONV (MISCELLANEOUS) ×2 IMPLANT
PENCIL SMOKE EVACUATOR (MISCELLANEOUS) ×2 IMPLANT
SUT MNCRL AB 4-0 PS2 18 (SUTURE) ×2 IMPLANT
SUT PROLENE 2 0 CT2 30 (SUTURE) ×6 IMPLANT
SUT VIC AB 2-0 SH 27 (SUTURE) ×1
SUT VIC AB 2-0 SH 27X BRD (SUTURE) ×2 IMPLANT
SUT VIC AB 3-0 SH 27 (SUTURE) ×1
SUT VIC AB 3-0 SH 27X BRD (SUTURE) ×2 IMPLANT
SYR CONTROL 10ML LL (SYRINGE) ×2 IMPLANT
TOWEL GREEN STERILE (TOWEL DISPOSABLE) ×2 IMPLANT
TOWEL GREEN STERILE FF (TOWEL DISPOSABLE) ×2 IMPLANT

## 2022-01-05 NOTE — Anesthesia Procedure Notes (Signed)
Procedure Name: Intubation Date/Time: 01/05/2022 2:27 PM  Performed by: Erick Colace, CRNAPre-anesthesia Checklist: Patient identified, Emergency Drugs available, Suction available and Patient being monitored Patient Re-evaluated:Patient Re-evaluated prior to induction Oxygen Delivery Method: Circle system utilized Preoxygenation: Pre-oxygenation with 100% oxygen Induction Type: IV induction Ventilation: Mask ventilation without difficulty Laryngoscope Size: Mac and 4 Grade View: Grade I Tube type: Oral Number of attempts: 1 Airway Equipment and Method: Stylet and Oral airway Placement Confirmation: ETT inserted through vocal cords under direct vision, positive ETCO2 and breath sounds checked- equal and bilateral Secured at: 23 cm Tube secured with: Tape Dental Injury: Teeth and Oropharynx as per pre-operative assessment and Injury to lip

## 2022-01-05 NOTE — Op Note (Signed)
  01/05/2022  3:23 PM  PATIENT:  Shawn [name]  87 y.o. male  PRE-OPERATIVE DIAGNOSIS:  RECURRENT RIGHT INGUINAL HERNIA   POST-OPERATIVE DIAGNOSIS:  RECURRENT RIGHT INGUINAL HERNIA   PROCEDURE:  Procedure(s): RIGHT HERNIA REPAIR RECURRENT INGUINAL WITH MESH, HERNIA DEFECT 1CM  SURGEON:  Surgeon(s): Georganna Skeans, MD  ASSISTANTS: Marja Kays, MD   ANESTHESIA:   local, regional, and general  EBL:  Total I/O In: -  Out: 50 [Blood:30]  BLOOD ADMINISTERED:none  DRAINS: none   SPECIMEN:  No Specimen  DISPOSITION OF SPECIMEN:  N/A  COUNTS:  YES  DICTATION: .Dragon Dictation Procedure in detail: Informed consent was obtained.  His site was marked.  He received a TAP block by anesthesia.Marland Kitchen  He received intravenous antibiotics.  He was brought the operating room and general endotracheal anesthesia was administered by the anesthesia staff.  His abdomen and groins were prepped and draped in a sterile fashion.  Timeout procedure was performed.  Local was injected in the right inguinal region.  Right inguinal incision was made.  Subcutaneous tissues were dissected down using cautery to the scarred external oblique fascia.  This was opened revealing the recurrent hernia.  The superior portion of the external bleak fascia was dissected free off the transversalis as we were able to.  The inferior leaflet was dissected free off of the hernia sac and we were able to identify the shelving edge of the inguinal ligament.  The hernia was then freed up from surrounding tissues.  There was no indirect component.  This was a direct hernia.  The hernia defect was quite small and we were not able to reduce the hernia.  I used cautery to open the hernia defect slightly and this facilitated reduction of the direct hernia and sac.  We did not open the sac.  This stayed nicely reduced and we were able to then repaired the hernia with a keyhole polypropylene mesh cut to custom size and shape.   Inferiorly it was tacked to the pubic tubercle with 2-0 Prolene.  It was sewn in a running fashion to the shelving edge of inguinal ligament with 2-0 Prolene.  It was then sewn with interrupted 2-0 Prolene sutures to the transversalis superiorly.  The 2 leaflets of the mesh were joined back together and tacked down to the underlying muscular tissue with interrupted 2-0 Prolene sutures.  An additional stitch was placed near where the cord exited to appropriately sized that portion of the mesh.  The area was copiously irrigated.  Hemostasis was obtained.  The mesh laid very nicely.  The external oblique fascia.  Was closed with a running 2-0 Vicryl.  Subcutaneous tissues were irrigated and Scarpa's fascia was closed with interrupted 3-0 Vicryl and the skin was closed with running 4-0 Monocryl subcuticular stitch followed by Dermabond.  All counts were correct.  He tolerated the procedure well without apparent complication and was taken recovery in stable condition. PATIENT DISPOSITION:  PACU - hemodynamically stable.   Delay start of Pharmacological VTE agent (>24hrs) due to surgical blood loss or risk of bleeding:  no  Georganna Skeans, MD, MPH, FACS Pager: [phone]  12/1/20233:23 PM

## 2022-01-05 NOTE — Progress Notes (Signed)
75 mcg IV fentanyl wasted in stericycle with Patsy H. RN after patient discharge.

## 2022-01-05 NOTE — H&P (Signed)
Shawn [name] is an 87 y.o. male.   Chief Complaint: recurrent right inguinal hernia HPI: 87yo M presents for repair recurrent right inguinal hernia. Symptoms have been stable. He held his Eliquis for 5 days.  Past Medical History:  Diagnosis Date   Allergy    Cardiac murmur    aortic sclerosis; moderate AS 08/11/21   Cataract    left eye   Clotting disorder ([place])    Factor v Leiden factor mutation   Colon polyps    Hyperplastic   Diverticulosis    Dysphagia    Factor V Leiden mutation ([place])    Hemorrhoids    History of blood clots    Hyperlipidemia    Osteoarthritis    Pulmonary embolism ([place]) 11/20/2009    Past Surgical History:  Procedure Laterality Date   APPENDECTOMY     CATARACT EXTRACTION  02/09/2007   right eye   INGUINAL HERNIA REPAIR     TONSILLECTOMY     VARICOSE VEIN SURGERY     VASECTOMY      Family History  Problem Relation Age of Onset   Lung cancer Father    Heart attack Mother 5   Heart disease Mother        chf   Arthritis Mother    Colon cancer Neg Hx    Esophageal cancer Neg Hx    Rectal cancer Neg Hx    Stomach cancer Neg Hx    Social History:  reports that he has never smoked. He has never used smokeless tobacco. He reports that he does not drink alcohol and does not use drugs.  Allergies:  Allergies  Allergen Reactions   Crestor [Rosuvastatin] Other (See Comments)    disorientation   Niacin Rash    "My whole body turned red on the prescription dose" He takes the OTC medication   Pravastatin Other (See Comments)    Disorientation    Medications Prior to Admission  Medication Sig Dispense Refill   acetaminophen (TYLENOL) 500 MG tablet Take 500-1,000 mg by mouth every 6 (six) hours as needed (pain.).     ascorbic acid (VITAMIN C) 500 MG tablet Take 500 mg by mouth daily with lunch.     Cholecalciferol 125 MCG (5000 UT) TABS Take 5,000 Units by mouth every Monday, Tuesday, Wednesday, Thursday, and Friday.  With lunch      donepezil (ARICEPT) 10 MG tablet Take 1 tablet daily 180 tablet 1   ELIQUIS 5 MG TABS tablet TAKE 1 TABLET BY MOUTH TWICE A DAY 180 tablet 1   ezetimibe (ZETIA) 10 MG tablet Take 1 tablet (10 mg total) by mouth daily. 90 tablet 3   Multiple Vitamin (MULTIVITAMIN WITH MINERALS) TABS tablet Take 1 tablet by mouth daily with lunch.     pantoprazole (PROTONIX) 40 MG tablet Take 1 tablet (40 mg total) by mouth daily. 30 tablet 3   saw palmetto 160 MG capsule Take 320 mg by mouth daily with lunch.     vitamin B-12 (CYANOCOBALAMIN) 1000 MCG tablet Take 1,000 mcg by mouth daily with lunch.      No results found for this or any previous visit (from the past 48 hour(s)). No results found.  Review of Systems  Blood pressure (!) 138/104, pulse 72, temperature 97.9 F (36.6 C), temperature source Oral, resp. rate 18, height '5\' 6"'$  (1.676 m), weight 74.8 kg, SpO2 96 %. Physical Exam Constitutional:      Appearance: He is not ill-appearing.  HENT:  Head: Normocephalic.     Mouth/Throat:     Mouth: Mucous membranes are moist.  Eyes:     Pupils: Pupils are equal, round, and reactive to light.  Cardiovascular:     Rate and Rhythm: Normal rate and regular rhythm.     Pulses: Normal pulses.     Heart sounds: Normal heart sounds.  Pulmonary:     Effort: Pulmonary effort is normal.     Breath sounds: Normal breath sounds.  Abdominal:     General: Abdomen is flat.     Palpations: Abdomen is soft.     Tenderness: There is no abdominal tenderness. There is no guarding.  Genitourinary:    Comments: RIH reduces easily Musculoskeletal:        General: No deformity.  Skin:    General: Skin is warm.  Neurological:     Mental Status: He is oriented to person, place, and time.     Comments: Movement disorder      Assessment/Plan Recurrent right inguinal hernia - for repair of recurrent right inguinal hernia with mesh. Procedure, risks, and benefits discussed and he agrees. I marked his  site.  Zenovia Jarred, MD 01/05/2022, 1:50 PM

## 2022-01-05 NOTE — Transfer of Care (Signed)
Immediate Anesthesia Transfer of Care Note  Patient: Shawn [name]  Procedure(s) Performed: RIGHT HERNIA REPAIR RECURRENT INGUINAL WITH THREE BY SIX POLYPROPYLENE MESH (Right: Groin)  Patient Location: PACU  Anesthesia Type:GA combined with regional for post-op pain  Level of Consciousness: awake  Airway & Oxygen Therapy: Patient Spontanous Breathing and Patient connected to face mask oxygen  Post-op Assessment: Report given to RN and Post -op Vital signs reviewed and stable  Post vital signs: Reviewed and stable  Last Vitals:  Vitals Value Taken Time  BP 130/69 01/05/22 1530  Temp    Pulse 79 01/05/22 1533  Resp 22 01/05/22 1533  SpO2 97 % 01/05/22 1533  Vitals shown include unvalidated device data.  Last Pain:  Vitals:   01/05/22 1324  TempSrc:   PainSc: 0-No pain         Complications: No notable events documented.

## 2022-01-05 NOTE — Anesthesia Procedure Notes (Signed)
Anesthesia Regional Block: Quadratus lumborum   Pre-Anesthetic Checklist: , timeout performed,  Correct Patient, Correct Site, Correct Laterality,  Correct Procedure, Correct Position, site marked,  Risks and benefits discussed,  Pre-op evaluation,  At surgeon's request and post-op pain management  Laterality: Right  Prep: Maximum Sterile Barrier Precautions used, chloraprep       Needles:  Injection technique: Single-shot  Needle Type: Echogenic Stimulator Needle     Needle Length: 9cm  Needle Gauge: 21     Additional Needles:   Procedures:,,,, ultrasound used (permanent image in chart),,    Narrative:  Start time: 01/05/2022 1:52 PM End time: 01/05/2022 1:56 PM Injection made incrementally with aspirations every 5 mL. Anesthesiologist: Freddrick March, MD

## 2022-01-06 NOTE — Anesthesia Postprocedure Evaluation (Signed)
Anesthesia Post Note  Patient: Lenn Volker  Procedure(s) Performed: RIGHT HERNIA REPAIR RECURRENT INGUINAL WITH THREE BY SIX POLYPROPYLENE MESH (Right: Groin)     Patient location during evaluation: PACU Anesthesia Type: Regional and General Level of consciousness: awake and alert Pain management: pain level controlled Vital Signs Assessment: post-procedure vital signs reviewed and stable Respiratory status: spontaneous breathing, nonlabored ventilation, respiratory function stable and patient connected to nasal cannula oxygen Cardiovascular status: blood pressure returned to baseline and stable Postop Assessment: no apparent nausea or vomiting Anesthetic complications: no  No notable events documented.  Last Vitals:  Vitals:   01/05/22 1545 01/05/22 1600  BP: 100/77 116/86  Pulse: 77 70  Resp: 12 16  Temp:  36.6 C  SpO2: 92% 100%    Last Pain:  Vitals:   01/05/22 1545  TempSrc:   PainSc: 5                  Rayne Cowdrey L Charlea Nardo

## 2022-01-08 ENCOUNTER — Encounter (HOSPITAL_COMMUNITY): Payer: Self-pay | Admitting: General Surgery

## 2022-01-15 ENCOUNTER — Telehealth: Payer: Self-pay | Admitting: *Deleted

## 2022-01-15 ENCOUNTER — Ambulatory Visit (INDEPENDENT_AMBULATORY_CARE_PROVIDER_SITE_OTHER): Payer: Medicare HMO | Admitting: Family Medicine

## 2022-01-15 ENCOUNTER — Encounter: Payer: Self-pay | Admitting: Family Medicine

## 2022-01-15 VITALS — BP 124/70 | HR 63 | Temp 97.6°F | Resp 18 | Ht 66.0 in | Wt 168.0 lb

## 2022-01-15 DIAGNOSIS — I2782 Chronic pulmonary embolism: Secondary | ICD-10-CM | POA: Diagnosis not present

## 2022-01-15 DIAGNOSIS — N400 Enlarged prostate without lower urinary tract symptoms: Secondary | ICD-10-CM

## 2022-01-15 DIAGNOSIS — E538 Deficiency of other specified B group vitamins: Secondary | ICD-10-CM | POA: Diagnosis not present

## 2022-01-15 DIAGNOSIS — I10 Essential (primary) hypertension: Secondary | ICD-10-CM

## 2022-01-15 DIAGNOSIS — R69 Illness, unspecified: Secondary | ICD-10-CM | POA: Diagnosis not present

## 2022-01-15 DIAGNOSIS — F0390 Unspecified dementia without behavioral disturbance: Secondary | ICD-10-CM

## 2022-01-15 DIAGNOSIS — R110 Nausea: Secondary | ICD-10-CM | POA: Diagnosis not present

## 2022-01-15 DIAGNOSIS — R413 Other amnesia: Secondary | ICD-10-CM

## 2022-01-15 DIAGNOSIS — E785 Hyperlipidemia, unspecified: Secondary | ICD-10-CM | POA: Diagnosis not present

## 2022-01-15 DIAGNOSIS — E559 Vitamin D deficiency, unspecified: Secondary | ICD-10-CM | POA: Diagnosis not present

## 2022-01-15 DIAGNOSIS — R1031 Right lower quadrant pain: Secondary | ICD-10-CM

## 2022-01-15 DIAGNOSIS — Z0000 Encounter for general adult medical examination without abnormal findings: Secondary | ICD-10-CM

## 2022-01-15 LAB — CBC WITH DIFFERENTIAL/PLATELET
Basophils Absolute: 0 10*3/uL (ref 0.0–0.1)
Basophils Relative: 0.6 % (ref 0.0–3.0)
Eosinophils Absolute: 0.1 10*3/uL (ref 0.0–0.7)
Eosinophils Relative: 3 % (ref 0.0–5.0)
HCT: 47.2 % (ref 39.0–52.0)
Hemoglobin: 16.8 g/dL (ref 13.0–17.0)
Lymphocytes Relative: 28.2 % (ref 12.0–46.0)
Lymphs Abs: 1.2 10*3/uL (ref 0.7–4.0)
MCHC: 35.6 g/dL (ref 30.0–36.0)
MCV: 97.9 fl (ref 78.0–100.0)
Monocytes Absolute: 0.5 10*3/uL (ref 0.1–1.0)
Monocytes Relative: 11.5 % (ref 3.0–12.0)
Neutro Abs: 2.5 10*3/uL (ref 1.4–7.7)
Neutrophils Relative %: 56.7 % (ref 43.0–77.0)
Platelets: 168 10*3/uL (ref 150.0–400.0)
RBC: 4.82 Mil/uL (ref 4.22–5.81)
RDW: 12.7 % (ref 11.5–15.5)
WBC: 4.4 10*3/uL (ref 4.0–10.5)

## 2022-01-15 LAB — TSH: TSH: 1.59 u[IU]/mL (ref 0.35–5.50)

## 2022-01-15 LAB — COMPREHENSIVE METABOLIC PANEL
ALT: 23 U/L (ref 0–53)
AST: 22 U/L (ref 0–37)
Albumin: 4.3 g/dL (ref 3.5–5.2)
Alkaline Phosphatase: 54 U/L (ref 39–117)
BUN: 17 mg/dL (ref 6–23)
CO2: 34 mEq/L — ABNORMAL HIGH (ref 19–32)
Calcium: 9.8 mg/dL (ref 8.4–10.5)
Chloride: 100 mEq/L (ref 96–112)
Creatinine, Ser: 1 mg/dL (ref 0.40–1.50)
GFR: 68.88 mL/min (ref >60.00)
Glucose, Bld: 96 mg/dL (ref 70–99)
Potassium: 4.4 mEq/L (ref 3.5–5.1)
Sodium: 140 mEq/L (ref 135–145)
Total Bilirubin: 1.3 mg/dL — ABNORMAL HIGH (ref 0.2–1.2)
Total Protein: 6.9 g/dL (ref 6.0–8.3)

## 2022-01-15 LAB — VITAMIN D 25 HYDROXY (VIT D DEFICIENCY, FRACTURES): VITD: 103.84 ng/mL (ref 30.00–100.00)

## 2022-01-15 LAB — PSA: PSA: 3.51 ng/mL (ref 0.10–4.00)

## 2022-01-15 LAB — LIPID PANEL
Cholesterol: 164 mg/dL (ref 0–200)
HDL: 42.4 mg/dL (ref >39.00)
LDL Cholesterol: 87 mg/dL (ref 0–99)
NonHDL: 121.73
Total CHOL/HDL Ratio: 4
Triglycerides: 176 mg/dL — ABNORMAL HIGH (ref 0.0–149.0)
VLDL: 35.2 mg/dL (ref 0.0–40.0)

## 2022-01-15 LAB — VITAMIN B12: Vitamin B-12: 1170 pg/mL — ABNORMAL HIGH (ref 211–911)

## 2022-01-15 MED ORDER — EZETIMIBE 10 MG PO TABS: 10.0000 mg | ORAL_TABLET | Freq: Every day | ORAL | 3 refills | Status: DC

## 2022-01-15 MED ORDER — PANTOPRAZOLE SODIUM 40 MG PO TBEC: 40.0000 mg | DELAYED_RELEASE_TABLET | Freq: Every day | ORAL | 3 refills | Status: DC

## 2022-01-15 NOTE — Assessment & Plan Note (Signed)
Per neuro  Pt is not driving

## 2022-01-15 NOTE — Assessment & Plan Note (Signed)
Ghm utd Check labs  See AVS  

## 2022-01-15 NOTE — Assessment & Plan Note (Signed)
Per neuro 

## 2022-01-15 NOTE — Patient Instructions (Signed)
Preventive Care 65 Years and Older, Male Preventive care refers to lifestyle choices and visits with your health care provider that can promote health and wellness. Preventive care visits are also called wellness exams. What can I expect for my preventive care visit? Counseling During your preventive care visit, your health care provider may ask about your: Medical history, including: Past medical problems. Family medical history. History of falls. Current health, including: Emotional well-being. Home life and relationship well-being. Sexual activity. Memory and ability to understand (cognition). Lifestyle, including: Alcohol, nicotine or tobacco, and drug use. Access to firearms. Diet, exercise, and sleep habits. Work and work environment. Sunscreen use. Safety issues such as seatbelt and bike helmet use. Physical exam Your health care provider will check your: Height and weight. These may be used to calculate your BMI (body mass index). BMI is a measurement that tells if you are at a healthy weight. Waist circumference. This measures the distance around your waistline. This measurement also tells if you are at a healthy weight and may help predict your risk of certain diseases, such as type 2 diabetes and high blood pressure. Heart rate and blood pressure. Body temperature. Skin for abnormal spots. What immunizations do I need?  Vaccines are usually given at various ages, according to a schedule. Your health care provider will recommend vaccines for you based on your age, medical history, and lifestyle or other factors, such as travel or where you work. What tests do I need? Screening Your health care provider may recommend screening tests for certain conditions. This may include: Lipid and cholesterol levels. Diabetes screening. This is done by checking your blood sugar (glucose) after you have not eaten for a while (fasting). Hepatitis C test. Hepatitis B test. HIV (human  immunodeficiency virus) test. STI (sexually transmitted infection) testing, if you are at risk. Lung cancer screening. Colorectal cancer screening. Prostate cancer screening. Abdominal aortic aneurysm (AAA) screening. You may need this if you are a current or former smoker. Talk with your health care provider about your test results, treatment options, and if necessary, the need for more tests. Follow these instructions at home: Eating and drinking  Eat a diet that includes fresh fruits and vegetables, whole grains, lean protein, and low-fat dairy products. Limit your intake of foods with high amounts of sugar, saturated fats, and salt. Take vitamin and mineral supplements as recommended by your health care provider. Do not drink alcohol if your health care provider tells you not to drink. If you drink alcohol: Limit how much you have to 0-2 drinks a day. Know how much alcohol is in your drink. In the [place] one drink equals one 12 oz bottle of beer (355 mL), one 5 oz glass of wine (148 mL), or one 1 oz glass of hard liquor (44 mL). Lifestyle Brush your teeth every morning and night with fluoride toothpaste. Floss one time each day. Exercise for at least 30 minutes 5 or more days each week. Do not use any products that contain nicotine or tobacco. These products include cigarettes, chewing tobacco, and vaping devices, such as e-cigarettes. If you need help quitting, ask your health care provider. Do not use drugs. If you are sexually active, practice safe sex. Use a condom or other form of protection to prevent STIs. Take aspirin only as told by your health care provider. Make sure that you understand how much to take and what form to take. Work with your health care provider to find out whether it is safe   and beneficial for you to take aspirin daily. Ask your health care provider if you need to take a cholesterol-lowering medicine (statin). Find healthy ways to manage stress, such  as: Meditation, yoga, or listening to music. Journaling. Talking to a trusted person. Spending time with friends and family. Safety Always wear your seat belt while driving or riding in a vehicle. Do not drive: If you have been drinking alcohol. Do not ride with someone who has been drinking. When you are tired or distracted. While texting. If you have been using any mind-altering substances or drugs. Wear a helmet and other protective equipment during sports activities. If you have firearms in your house, make sure you follow all gun safety procedures. Minimize exposure to UV radiation to reduce your risk of skin cancer. What's next? Visit your health care provider once a year for an annual wellness visit. Ask your health care provider how often you should have your eyes and teeth checked. Stay up to date on all vaccines. This information is not intended to replace advice given to you by your health care provider. Make sure you discuss any questions you have with your health care provider. Document Revised: 07/20/2020 Document Reviewed: 07/20/2020 Elsevier Patient Education  2023 Elsevier Inc.  

## 2022-01-15 NOTE — Assessment & Plan Note (Signed)
S/p ing hernia repair  Wound healing well

## 2022-01-15 NOTE — Telephone Encounter (Signed)
CRITICAL VALUE STICKER   CRITICAL VALUE:  vit D 103.84  RECEIVER (on-site recipient of call): Kelle Darting, Malcolm NOTIFIED:  01/15/22 @ 3:14pm  MESSENGER (representative from lab): Roger Kill  MD NOTIFIED: lowne chase  TIME OF NOTIFICATION: 3:16pm  RESPONSE:

## 2022-01-15 NOTE — Assessment & Plan Note (Signed)
On eliquis

## 2022-01-15 NOTE — Progress Notes (Signed)
Subjective:   By signing my name below, I, [name], attest that this documentation has been prepared under the direction and in the presence of Ann Held DO 01/15/2022   Patient ID: Shawn [name], male    DOB: 1934/04/30, 87 y.o.   MRN: [id number]  Chief Complaint  Patient presents with   Annual Exam    Pt states fasting     HPI Patient is in today for a comprehensive physical exam. He is accompanied by his wife.   He is requesting a refill of 10 mg of Zetia.   He had hernia repair 12/1--  he was sitting in his chair and suddenly felt excruciating pain in the area and ems had to come pick up the pt.  He had a right hernia repair on 01/05/2022 and states that there is pain around the scarring area. There was mild dilatation of the ascending aorta, about 38 mm. He is scheduled for a follow up on 01/24/2022. He notes that his platelet count is low in the hosp and prostate was enlarged.  No symptoms except nocturia.   His wife is inquiring if he should continue the 40 mg of Protonix medication. He denies of any reflux symptoms. He does not have the medication at home.    He is inquiring whether 5000 units of Vitamin D3 is too much for the patient.   He follows up with his neurologist, Dr.Aquino, about twice a year. He was last seen by Dr.Aquino on 11/22/2020 for neurodegenerative dementia w/out behavioral disturbance. He states that since the hernia repair, his memory has not been good.   He denies having any fever, new muscle pain, joint pain , new moles, congestion, sinus pain, sore throat, chest pain, palpations, cough, SOB ,wheezing,n/v/d constipation, blood in stool, dysuria, frequency, hematuria, at this time  Colonoscopy was last completed on 11/09/2011 PSA was last completed on 08/15/2021 He is UTD on the shingles and pneumonia vaccines.  He is UTD on dental exams.  He is being [facility] by [facility].   Past Medical History:  Diagnosis  Date   Allergy    Cardiac murmur    aortic sclerosis; moderate AS 08/11/21   Cataract    left eye   Clotting disorder ([place])    Factor v Leiden factor mutation   Colon polyps    Hyperplastic   Diverticulosis    Dysphagia    Factor V Leiden mutation ([place])    Hemorrhoids    History of blood clots    Hyperlipidemia    Osteoarthritis    Pulmonary embolism ([place]) 11/20/2009    Past Surgical History:  Procedure Laterality Date   APPENDECTOMY     CATARACT EXTRACTION  02/09/2007   right eye   INGUINAL HERNIA REPAIR     INGUINAL HERNIA REPAIR Right 01/05/2022   Procedure: RIGHT HERNIA REPAIR RECURRENT INGUINAL WITH THREE BY SIX POLYPROPYLENE MESH;  Surgeon: Georganna Skeans, MD;  Location: [facility];  Service: General;  Laterality: Right;  GEN AND TAP BLOCK   TONSILLECTOMY     VARICOSE VEIN SURGERY     VASECTOMY      Family History  Problem Relation Age of Onset   Lung cancer Father    Heart attack Mother 63   Heart disease Mother        chf   Arthritis Mother    Colon cancer Neg Hx    Esophageal cancer Neg Hx    Rectal cancer Neg Hx  Stomach cancer Neg Hx     Social History   Socioeconomic History   Marital status: Married    Spouse name: Not on file   Number of children: 4   Years of education: Not on file   Highest education level: Not on file  Occupational History   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    Right handed   One story home   Social Determinants of Health    Financial Resource Strain: Low Risk  (12/20/2021)   Overall Financial Resource Strain (CARDIA)    Difficulty of Paying Living Expenses: Not hard at all  Food Insecurity: No Food Insecurity (12/20/2021)   Hunger Vital Sign    Worried About Running Out of Food in the Last Year: Never true    Ran Out of Food in the Last Year: Never true  Transportation Needs: No Transportation Needs (12/20/2021)   PRAPARE - Hydrologist (Medical): No    Lack of Transportation (Non-Medical): No  Physical Activity: Inactive (12/20/2021)   Exercise Vital Sign    Days of Exercise per Week: 0 days    Minutes of Exercise per Session: 0 min  Stress: No Stress Concern Present (12/20/2021)   [place]    Feeling of Stress : Only a little  Social Connections: Moderately Isolated (12/20/2021)   Social Connection and Isolation Panel [NHANES]    Frequency of Communication with Friends and Family: Twice a week    Frequency of Social Gatherings with Friends and Family: Twice a week    Attends Religious Services: Never    Marine scientist or Organizations: No    Attends Archivist Meetings: Never    Marital Status: Married  Human resources officer Violence: Not At Risk (12/20/2021)   Humiliation, Afraid, Rape, and Kick questionnaire    Fear of Current or Ex-Partner: No    Emotionally Abused: No    Physically Abused: No    Sexually Abused: No    Outpatient Medications Prior to Visit  Medication Sig Dispense Refill   acetaminophen (TYLENOL) 500 MG tablet Take 500-1,000 mg by mouth every 6 (six) hours as needed (pain.).     ascorbic acid (VITAMIN C) 500 MG tablet Take 500 mg by mouth daily with lunch.     Cholecalciferol 125 MCG (5000 UT) TABS Take 5,000 Units by mouth every Monday, Tuesday, Wednesday, Thursday, and Friday.  With lunch     donepezil (ARICEPT) 10 MG tablet Take 1 tablet daily 180 tablet 1    ELIQUIS 5 MG TABS tablet TAKE 1 TABLET BY MOUTH TWICE A DAY 180 tablet 1   Multiple Vitamin (MULTIVITAMIN WITH MINERALS) TABS tablet Take 1 tablet by mouth daily with lunch.     oxyCODONE (OXY IR/ROXICODONE) 5 MG immediate release  tablet Take 1 tablet (5 mg total) by mouth every 6 (six) hours as needed for severe pain. 20 tablet 0   saw palmetto 160 MG capsule Take 320 mg by mouth daily with lunch.     vitamin B-12 (CYANOCOBALAMIN) 1000 MCG tablet Take 1,000 mcg by mouth daily with lunch.     ezetimibe (ZETIA) 10 MG tablet Take 1 tablet (10 mg total) by mouth daily. 90 tablet 3   pantoprazole (PROTONIX) 40 MG tablet Take 1 tablet (40 mg total) by mouth daily. 30 tablet 3   No facility-administered medications prior to visit.    Allergies  Allergen Reactions   Crestor [Rosuvastatin] Other (See Comments)    disorientation   Niacin Rash    "My whole body turned red on the prescription dose" He takes the OTC medication   Pravastatin Other (See Comments)    Disorientation    Review of Systems  Constitutional:  Negative for fever and malaise/fatigue.  HENT:  Negative for congestion, sinus pain and sore throat.   Eyes:  Negative for blurred vision.  Respiratory:  Negative for cough, shortness of breath and wheezing.   Cardiovascular:  Negative for chest pain, palpitations and leg swelling.  Gastrointestinal:  Negative for blood in stool, constipation, diarrhea, nausea and vomiting.  Genitourinary:  Negative for dysuria, frequency and hematuria.  Musculoskeletal:  Negative for back pain, joint pain and myalgias.  Skin:  Negative for rash.       (-) [place]  Neurological:  Negative for loss of consciousness and headaches.       Objective:    Physical Exam Vitals and nursing note reviewed.  Constitutional:      General: He is not in acute distress.    Appearance: Normal appearance. He is not ill-appearing.  HENT:     Head: Normocephalic and atraumatic.     Right Ear: Tympanic  membrane, ear canal and external ear normal.     Left Ear: Tympanic membrane, ear canal and external ear normal.  Eyes:     Extraocular Movements: Extraocular movements intact.     Pupils: Pupils are equal, round, and reactive to light.  Neck:     Thyroid: No thyromegaly.  Cardiovascular:     Rate and Rhythm: Normal rate and regular rhythm.     Heart sounds: Normal heart sounds. No murmur heard.    No gallop.  Pulmonary:     Effort: Pulmonary effort is normal. No respiratory distress.     Breath sounds: Normal breath sounds. No wheezing or rales.  Abdominal:     General: Bowel sounds are normal. There is no distension.     Palpations: Abdomen is soft.     Tenderness: There is no abdominal tenderness. There is no guarding.  Lymphadenopathy:     Cervical: No cervical adenopathy.  Skin:    General: Skin is warm and dry.  Neurological:     Mental Status: He is alert and oriented to person, place, and time.  Psychiatric:        Judgment: Judgment normal.     BP 124/70 (BP Location: Right Arm, Patient Position: Sitting, Cuff Size: Normal)   Pulse 63   Temp 97.6 F (36.4 C) (Oral)   Resp 18   Ht '5\' 6"'$  (1.676 m)   Wt 168 lb (76.2 kg)   SpO2 93%   BMI 27.12 kg/m  Wt Readings from Last 3 Encounters:  01/15/22 168 lb (76.2 kg)  01/05/22 165 lb (74.8 kg)  12/21/21  166 lb 6.4 oz (75.5 kg)    Diabetic Foot Exam - Simple   No data filed    Lab Results  Component Value Date   WBC 4.2 12/21/2021   HGB 17.4 (H) 12/21/2021   HCT 49.8 12/21/2021   PLT 144 (L) 12/21/2021   GLUCOSE 94 12/21/2021   CHOL 166 08/11/2021   TRIG 110 08/11/2021   HDL 37 (L) 08/11/2021   LDLDIRECT 86.0 06/17/2014   LDLCALC 107 (H) 08/11/2021   ALT 17 11/16/2021   AST 19 11/16/2021   NA 141 12/21/2021   K 4.0 12/21/2021   CL 104 12/21/2021   CREATININE 1.07 12/21/2021   BUN 16 12/21/2021   CO2 32 12/21/2021   TSH 2.08 08/15/2021   PSA 2.90 08/15/2021   INR 1.1 08/09/2021   HGBA1C 4.8  08/11/2021   MICROALBUR 0.7 11/15/2014    Lab Results  Component Value Date   TSH 2.08 08/15/2021   Lab Results  Component Value Date   WBC 4.2 12/21/2021   HGB 17.4 (H) 12/21/2021   HCT 49.8 12/21/2021   MCV 98.6 12/21/2021   PLT 144 (L) 12/21/2021   Lab Results  Component Value Date   NA 141 12/21/2021   K 4.0 12/21/2021   CO2 32 12/21/2021   GLUCOSE 94 12/21/2021   BUN 16 12/21/2021   CREATININE 1.07 12/21/2021   BILITOT 1.1 11/16/2021   ALKPHOS 57 11/16/2021   AST 19 11/16/2021   ALT 17 11/16/2021   PROT 6.8 11/16/2021   ALBUMIN 4.2 11/16/2021   CALCIUM 9.5 12/21/2021   ANIONGAP 5 12/21/2021   GFR 62.18 11/16/2021   Lab Results  Component Value Date   CHOL 166 08/11/2021   Lab Results  Component Value Date   HDL 37 (L) 08/11/2021   Lab Results  Component Value Date   LDLCALC 107 (H) 08/11/2021   Lab Results  Component Value Date   TRIG 110 08/11/2021   Lab Results  Component Value Date   CHOLHDL 4.5 08/11/2021   Lab Results  Component Value Date   HGBA1C 4.8 08/11/2021       Assessment & Plan:   Problem List Items Addressed This Visit       Unprioritized   Right groin pain    S/p ing hernia repair  Wound healing well       Pulmonary embolism ([place])    On eliquis       Relevant Medications   ezetimibe (ZETIA) 10 MG tablet   Preventative health care    Ghm utd Check labs  See AVS       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 ([place])    Per neuro  Pt is not driving       Memory loss    Per neuro       Hyperlipidemia   Relevant Medications   ezetimibe (ZETIA) 10 MG tablet   Other Relevant Orders   Lipid panel   TSH   Comprehensive metabolic panel   Other Visit Diagnoses     Enlarged prostate    -  Primary   Relevant Orders   Ambulatory referral to Urology   PSA   TSH   Essential hypertension       Relevant Medications   ezetimibe (ZETIA) 10 MG tablet   Other Relevant Orders   TSH    Comprehensive metabolic panel   CBC with Differential/Platelet   Nausea       Relevant Medications   pantoprazole (PROTONIX)  40 MG tablet   Vitamin D deficiency       Relevant Orders   VITAMIN D 25 Hydroxy (Vit-D Deficiency, Fractures)   B12 deficiency       Relevant Orders   Vitamin B12      Meds ordered this encounter  Medications   ezetimibe (ZETIA) 10 MG tablet    Sig: Take 1 tablet (10 mg total) by mouth daily.    Dispense:  90 tablet    Refill:  3   pantoprazole (PROTONIX) 40 MG tablet    Sig: Take 1 tablet (40 mg total) by mouth daily.    Dispense:  90 tablet    Refill:  3    I, Ann Held, DO, personally preformed the services described in this documentation.  All medical record entries made by the scribe were at my direction and in my presence.  I have reviewed the chart and discharge instructions (if applicable) and agree that the record reflects my personal performance and is accurate and complete. 01/15/2022   I,Amber Collins,acting as a scribe for Ann Held, DO.,have documented all relevant documentation on the behalf of Ann Held, DO,as directed by  Ann Held, DO while in the presence of Ann Held, DO.    Ann Held, DO

## 2022-01-16 NOTE — Telephone Encounter (Signed)
Patient notified of results.

## 2022-01-16 NOTE — Progress Notes (Signed)
Patient wife notified of results and notes.

## 2022-01-18 ENCOUNTER — Telehealth: Payer: Self-pay | Admitting: Physician Assistant

## 2022-01-18 NOTE — Telephone Encounter (Signed)
Pt's wife called in stating the pt just had a physical and was told to stop taking the B12. She is concerned due to it being important for him to take for his memory.

## 2022-01-18 NOTE — Telephone Encounter (Signed)
Patient advised and thanked me for calling, he will hold b12 for now

## 2022-02-09 ENCOUNTER — Telehealth: Payer: Self-pay | Admitting: Family Medicine

## 2022-02-09 NOTE — Telephone Encounter (Signed)
Patient's wife called to request lab results be mailed.

## 2022-02-12 NOTE — Telephone Encounter (Signed)
Labs mailed

## 2022-02-15 DIAGNOSIS — H35371 Puckering of macula, right eye: Secondary | ICD-10-CM | POA: Diagnosis not present

## 2022-02-15 DIAGNOSIS — H43813 Vitreous degeneration, bilateral: Secondary | ICD-10-CM | POA: Diagnosis not present

## 2022-02-15 DIAGNOSIS — H04123 Dry eye syndrome of bilateral lacrimal glands: Secondary | ICD-10-CM | POA: Diagnosis not present

## 2022-02-16 DIAGNOSIS — H524 Presbyopia: Secondary | ICD-10-CM | POA: Diagnosis not present

## 2022-02-16 DIAGNOSIS — H5213 Myopia, bilateral: Secondary | ICD-10-CM | POA: Diagnosis not present

## 2022-02-16 DIAGNOSIS — H52209 Unspecified astigmatism, unspecified eye: Secondary | ICD-10-CM | POA: Diagnosis not present

## 2022-02-22 ENCOUNTER — Encounter: Payer: Self-pay | Admitting: Physician Assistant

## 2022-02-22 ENCOUNTER — Ambulatory Visit: Payer: Medicare HMO | Admitting: Physician Assistant

## 2022-02-22 VITALS — BP 126/76 | HR 68 | Resp 20 | Ht 66.0 in | Wt 168.0 lb

## 2022-02-22 DIAGNOSIS — F03918 Unspecified dementia, unspecified severity, with other behavioral disturbance: Secondary | ICD-10-CM | POA: Diagnosis not present

## 2022-02-22 DIAGNOSIS — R69 Illness, unspecified: Secondary | ICD-10-CM | POA: Diagnosis not present

## 2022-02-22 NOTE — Patient Instructions (Signed)
It was a pleasure to see you today at our office.   Recommendations:  Follow up in 6  months  Continue donepezil 10 mg daily.   Referral to hematology is recommended for Factor V Leiden Recommend PT/OT as per family doctor      Whom to call:  Memory  decline, memory medications: Call our office [phone]   For psychiatric meds, mood meds: Please have your primary care physician manage these medications.   Counseling regarding caregiver distress, including caregiver depression, anxiety and issues regarding community resources, adult day care programs, adult living facilities, or memory care questions:   Feel free to contact [place], Social Worker at [phone]   For assessment of decision of mental capacity and competency:  Call Dr. Anthoney Harada, geriatric psychiatrist at [phone]  For guidance in geriatric dementia issues please call [facility] [phone]  For guidance regarding [facility] and if placement were needed at the facility, contact Arnell Asal, Social Worker tel: [phone]  [facility]  [place], [place] [address] [phone]  Hours of Operation Mondays to Thursdays: 8 am to 8 pm,Fridays: 9 am to 8 pm, Saturdays: 9 am to 1 pm Sundays: Closed  [email or web address]/departments/parks-recreation/active-adults-[email or web address]/[email or web address]  If you have any severe symptoms of a stroke, or other severe issues such as confusion,severe chills or fever, etc call 911 or go to the ER as you may need to be evaluated further   Feel free to visit Facebook page " [email or web address]" for tips of how to care for people with memory problems.     RECOMMENDATIONS FOR ALL PATIENTS WITH MEMORY PROBLEMS: 1. Continue to exercise (Recommend 30 minutes of walking everyday, or 3 hours every week) 2. Increase social interactions - continue going to [place] and enjoy social gatherings with friends  and family 3. Eat healthy, avoid fried foods and eat more fruits and vegetables 4. Maintain adequate blood pressure, blood sugar, and blood cholesterol level. Reducing the risk of stroke and cardiovascular disease also helps promoting better memory. 5. Avoid stressful situations. Live a simple life and avoid aggravations. Organize your time and prepare for the next day in anticipation. 6. Sleep well, avoid any interruptions of sleep and avoid any distractions in the bedroom that may interfere with adequate sleep quality 7. Avoid sugar, avoid sweets as there is a strong link between excessive sugar intake, diabetes, and cognitive impairment We discussed the Mediterranean diet, which has been shown to help patients reduce the risk of progressive memory disorders and reduces cardiovascular risk. This includes eating fish, eat fruits and green leafy vegetables, nuts like almonds and hazelnuts, walnuts, and also use olive oil. Avoid fast foods and fried foods as much as possible. Avoid sweets and sugar as sugar use has been linked to worsening of memory function.  There is always a concern of gradual progression of memory problems. If this is the case, then we may need to adjust level of care according to patient needs. Support, both to the patient and caregiver, should then be put into place.    The Alzheimer's Association is here all day, every day for people facing Alzheimer's disease through our free 24/7 Helpline: 929-187-7159. The Helpline provides reliable information and support to all those who need assistance, such as individuals living with memory loss, Alzheimer's or other dementia, caregivers, health care professionals and the public.  Our highly trained and knowledgeable staff can help you with: Understanding memory loss, dementia and Alzheimer's  Medications  and other treatment options  General information about aging and brain health  Skills to provide quality care and to find the best  care from professionals  Legal, financial and living-arrangement decisions Our Helpline also features: Confidential care consultation provided by master's level clinicians who can help with decision-making support, crisis assistance and education on issues families face every day  Help in a caller's preferred language using our translation service that features more than 200 languages and dialects  Referrals to local community programs, services and ongoing support     FALL PRECAUTIONS: Be cautious when walking. Scan the area for obstacles that may increase the risk of trips and falls. When getting up in the mornings, sit up at the edge of the bed for a few minutes before getting out of bed. Consider elevating the bed at the head end to avoid drop of blood pressure when getting up. Walk always in a well-lit room (use night lights in the walls). Avoid area rugs or power cords from appliances in the middle of the walkways. Use a walker or a cane if necessary and consider physical therapy for balance exercise. Get your eyesight checked regularly.  FINANCIAL OVERSIGHT: Supervision, especially oversight when making financial decisions or transactions is also recommended.  HOME SAFETY: Consider the safety of the kitchen when operating appliances like stoves, microwave oven, and blender. Consider having supervision and share cooking responsibilities until no longer able to participate in those. Accidents with firearms and other hazards in the house should be identified and addressed as well.   ABILITY TO BE LEFT ALONE: If patient is unable to contact 911 operator, consider using LifeLine, or when the need is there, arrange for someone to stay with patients. Smoking is a fire hazard, consider supervision or cessation. Risk of wandering should be assessed by caregiver and if detected at any point, supervision and safe proof recommendations should be instituted.  MEDICATION SUPERVISION: Inability to  self-administer medication needs to be constantly addressed. Implement a mechanism to ensure safe administration of the medications.   DRIVING: Regarding driving, in patients with progressive memory problems, driving will be impaired. We advise to have someone else do the driving if trouble finding directions or if minor accidents are reported. Independent driving assessment is available to determine safety of driving.   If you are interested in the driving assessment, you can contact the following:  [employer] in [place]  [place] [phone] or [phone]      [place] refers to food and lifestyle choices that are based on the traditions of countries located on the [employer]. This way of eating has been shown to help prevent certain conditions and improve outcomes for people who have chronic diseases, like kidney disease and heart disease. What are tips for following this plan? Lifestyle  Cook and eat meals together with your family, when possible. Drink enough fluid to keep your urine clear or pale yellow. Be physically active every day. This includes: Aerobic exercise like running or swimming. Leisure activities like gardening, walking, or housework. Get 7-8 hours of sleep each night. If recommended by your health care provider, drink red wine in moderation. This means 1 glass a day for nonpregnant women and 2 glasses a day for men. A glass of wine equals 5 oz (150 mL). Reading food labels  Check the serving size of packaged foods. For foods such as rice and pasta,  the serving size refers to the amount of cooked product, not dry. Check the total fat in packaged foods. Avoid foods that have saturated fat or trans fats. Check the ingredients list for added sugars, such as corn syrup. Shopping  At the grocery store, buy  most of your food from the areas near the walls of the store. This includes: Fresh fruits and vegetables (produce). Grains, beans, nuts, and seeds. Some of these may be available in unpackaged forms or large amounts (in bulk). Fresh seafood. Poultry and eggs. Low-fat dairy products. Buy whole ingredients instead of prepackaged foods. Buy fresh fruits and vegetables in-season from local farmers markets. Buy frozen fruits and vegetables in resealable bags. If you do not have access to quality fresh seafood, buy precooked frozen shrimp or canned fish, such as tuna, salmon, or sardines. Buy small amounts of raw or cooked vegetables, salads, or olives from the deli or salad bar at your store. Stock your pantry so you always have certain foods on hand, such as olive oil, canned tuna, canned tomatoes, rice, pasta, and beans. Cooking  Cook foods with extra-virgin olive oil instead of using butter or other vegetable oils. Have meat as a side dish, and have vegetables or grains as your main dish. This means having meat in small portions or adding small amounts of meat to foods like pasta or stew. Use beans or vegetables instead of meat in common dishes like chili or lasagna. Experiment with different cooking methods. Try roasting or broiling vegetables instead of steaming or sauteing them. Add frozen vegetables to soups, stews, pasta, or rice. Add nuts or seeds for added healthy fat at each meal. You can add these to yogurt, salads, or vegetable dishes. Marinate fish or vegetables using olive oil, lemon juice, garlic, and fresh herbs. Meal planning  Plan to eat 1 vegetarian meal one day each week. Try to work up to 2 vegetarian meals, if possible. Eat seafood 2 or more times a week. Have healthy snacks readily available, such as: Vegetable sticks with hummus. Greek yogurt. Fruit and nut trail mix. Eat balanced meals throughout the week. This includes: Fruit: 2-3 servings a day Vegetables: 4-5  servings a day Low-fat dairy: 2 servings a day Fish, poultry, or lean meat: 1 serving a day Beans and legumes: 2 or more servings a week Nuts and seeds: 1-2 servings a day Whole grains: 6-8 servings a day Extra-virgin olive oil: 3-4 servings a day Limit red meat and sweets to only a few servings a month What are my food choices? Mediterranean diet Recommended Grains: Whole-grain pasta. Brown rice. Bulgar wheat. Polenta. Couscous. Whole-wheat bread. [place]. Vegetables: Artichokes. Beets. Broccoli. Cabbage. Carrots. Eggplant. Green beans. Chard. Kale. Spinach. Onions. Leeks. Peas. Squash. Tomatoes. Peppers. Radishes. Fruits: Apples. Apricots. Avocado. Berries. Bananas. Cherries. Dates. Figs. Grapes. Lemons. Melon. Oranges. Peaches. Plums. Pomegranate. Meats and other protein foods: Beans. Almonds. Sunflower seeds. Pine nuts. Peanuts. [place]. Salmon. Scallops. Shrimp. [place]. Tilapia. Clams. Oysters. Eggs. Dairy: Low-fat milk. Cheese. Greek yogurt. Beverages: Water. Red wine. Herbal tea. Fats and oils: Extra virgin olive oil. Avocado oil. Grape seed oil. Sweets and desserts: [place] yogurt with honey. Baked apples. Poached pears. Trail mix. Seasoning and other foods: Basil. Cilantro. Coriander. Cumin. Mint. Parsley. Sage. Rosemary. Tarragon. Garlic. Oregano. Thyme. Pepper. Balsalmic vinegar. Tahini. Hummus. Tomato sauce. Olives. Mushrooms. Limit these Grains: Prepackaged pasta or rice dishes. Prepackaged cereal with added sugar. Vegetables: Deep fried potatoes (french fries). Fruits: Fruit canned in syrup. Meats and other protein foods:  Beef. Pork. Lamb. Poultry with skin. Hot dogs. [place] Salines. Dairy: Ice cream. Sour cream. Whole milk. Beverages: Juice. Sugar-sweetened soft drinks. Beer. Liquor and spirits. Fats and oils: Butter. Canola oil. Vegetable oil. Beef fat (tallow). Lard. Sweets and desserts: Cookies. Cakes. Pies. Candy. Seasoning and other foods: Mayonnaise. Premade sauces and  marinades. The items listed may not be a complete list. Talk with your dietitian about what dietary choices are right for you. Summary The Mediterranean diet includes both food and lifestyle choices. Eat a variety of fresh fruits and vegetables, beans, nuts, seeds, and whole grains. Limit the amount of red meat and sweets that you eat. Talk with your health care provider about whether it is safe for you to drink red wine in moderation. This means 1 glass a day for nonpregnant women and 2 glasses a day for men. A glass of wine equals 5 oz (150 mL). This information is not intended to replace advice given to you by your health care provider. Make sure you discuss any questions you have with your health care provider. Document Released: 09/15/2015 Document Revised: 10/18/2015 Document Reviewed: 09/15/2015 Elsevier Interactive Patient Education  2017 [employer].

## 2022-02-22 NOTE — Progress Notes (Signed)
Assessment/Plan:   Dementia, likely due to Alzheimer's disease  Shawn [name] is a very pleasant 88 y.o. RH male with a history of hyperlipidemia, PE and DVT, Factor V Leiden deficiency on Eliquis, and a diagnosis of mild dementia (although the time in January 2022 did not meet clearly the full criteria, with atypical Alzheimer's as a differential with a vascular contribution (seen today in follow up for memory loss.  He declines repeat Neuropsych evaluation for clarity of the diagnosis and disease progression.  Patient is currently on donepezil 10 mg daily, tolerating well.  7/ 07/2021 MRI brain personally reviewed was remarkable for chronic ischemic microangiopathy and generalized volume loss without acute findings.  MMSE today is 23/30.  There is a slight decrease, with date, however testing is essentially unchanged.      Follow up in 6  months. Continue donepezil 10 mg daily, side effects discussed Continue good control of cardiovascular risk factors Continue to follow-up factor V Leiden with hematology (he has a history of PE and DVT and TIA) Continue to control mood as per PCP    Subjective:    This patient is accompanied in the office by his wife  who supplements the history.  Previous records as well as any outside records available were reviewed prior to todays visit. Patient was last seen on 08/22/21.  MMSE of 05/24/2021 was 25/30.   Any changes in memory since last visit?  "About the same ".  Patient denies difficulty remembering recent conversations and people names . He does jigsaw and word finding. He does not like to read. repeats oneself?  Endorsed Disoriented ?  During the night he may experience some sundowning, "not every night ", he thinks his bed is tilted.  "If the lights turned on is better "-his wife says. Leaving objects in unusual places? denies   Wandering behavior?  denies   Any personality changes since last visit?  denies   Any worsening  depression?:  He is aware that his friends are dying, inability to drive and aware that his health is not good, which contributes to situational depression.  He has not sought behavioral therapy, he declines any referral. Hallucinations or paranoia? During the night, in only 1 episode he saw things in the wall, such as bugs. Seizures?    denies    Any sleep changes?  Denies vivid dreams, REM behavior or sleepwalking   Sleep apnea?   denies   Any hygiene concerns?    denies   Independent of bathing and dressing?  Endorsed  Does the patient needs help with medications? Wife  is in charge Who is in charge of the finances?  Both are in charge Any changes in appetite? Lost weight, less desire to eat since his TIA in 08/2021, takes Ensure 1 a day Patient have trouble swallowing?  denies   Does the patient cook?  No  Any headaches?   denies   Chronic back pain  denies   Ambulates with difficulty?  After PT-OT after his TIA, his ambulation has improved. Recent falls or head injuries? denies     Unilateral weakness, numbness or tingling? denies   Any tremors?  denies   Any anosmia?  Patient denies   Any incontinence of urine?  denies   Any bowel dysfunction?  denies      Patient lives  wife Does the patient drive?no longer drives   History on Initial Assessment 04/22/2020: This is an 88 year old right-handed man with  a history of hyperlipidemia, PE and DVT, Factor V Leiden deficiency, presenting for evaluation of dementia. His wife is present to provide additional information. Records were reviewed, he underwent Neuropsychological evaluation in January 2022 with very significant cognitive impairment with profound visuospatial problems and additional low scores on measures of processing speed, executive function, and working memory. 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 Atypical Alzheimer's is also a possibility. Vascular disease may be contributing but does not well explain his higher cortical signs. MRI brain in 2017 showed moderate chronic microvascular disease and mild diffuse volume loss. Repeat MRI brain in 2022 showed progression of chronic microvascular disease, no acute changes.    He feels his memory is good, "not perfect." His wife started noticing forgetfulness and word-finding difficulties a couple of years ago. His handwriting has always been terrible, a little more illegible than before. He continues to drive and denies getting lost driving. He manages his own medications and denies missing doses. They do bills together. His wife denies any difficulties following instructions or using the remote control/microwave. He is independent with dressing and bathing. No personality changes, mood is "same as always," no paranoia or hallucinations.   They started noticing involuntary hand movements a few months ago. He has intermittent right arm myoclonus throughout the visit. They note he moves his fingers without realizing. Movements do not affect writing or using utensils. He has not noticed them in his legs. No falls. Sleep is good, no REM behavior disorder. He does fall asleep when he reads during the day. No staring/unresponsive episodes, gaps in time, olfactory/gustatory hallucinations. No change in gait, he denies any stiffness/difficulty with getting out of bed.    Laboratory Data:          Lab Results  Component Value Date    TSH 1.73 01/11/2020             Lab Results  Component Value Date    VITAMINB12 >1526 (H) 01/11/2020     PREVIOUS MEDICATIONS:   CURRENT MEDICATIONS:  Outpatient Encounter Medications as of 02/22/2022  Medication Sig   acetaminophen  (TYLENOL) 500 MG tablet Take 500-1,000 mg by mouth every 6 (six) hours as needed (pain.).   ascorbic acid (VITAMIN C) 500 MG tablet Take 500 mg by mouth daily with lunch.   Cholecalciferol 125 MCG (5000 UT) TABS Take 5,000 Units by mouth every Monday, Tuesday, Wednesday, Thursday, and Friday.  With lunch   donepezil (ARICEPT) 10 MG tablet Take 1 tablet daily   ELIQUIS 5 MG TABS tablet TAKE 1 TABLET BY MOUTH TWICE A DAY   ezetimibe (ZETIA) 10 MG tablet Take 1 tablet (10 mg total) by mouth daily.   Multiple Vitamin (MULTIVITAMIN WITH MINERALS) TABS tablet Take 1 tablet by mouth daily with lunch.   oxyCODONE (OXY IR/ROXICODONE) 5 MG immediate release tablet Take 1 tablet (5 mg total) by mouth every 6 (six) hours as needed for severe pain.   pantoprazole (PROTONIX) 40 MG tablet Take 1 tablet (40 mg total) by mouth daily.   saw palmetto 160 MG capsule Take 320 mg by mouth daily with lunch.   vitamin B-12 (CYANOCOBALAMIN) 1000 MCG tablet Take 1,000 mcg by mouth daily with lunch.   No facility-administered encounter medications on file as of  02/22/2022.       02/22/2022   12:00 PM 05/24/2021   11:00 AM 12/22/2015   10:02 AM  MMSE - Mini Mental State Exam  Orientation to time '1 4 5  '$ Orientation to Place '5 4 5  '$ Registration '3 3 3  '$ Attention/ Calculation '5 5 5  '$ Recall '1 1 3  '$ Language- name 2 objects '2 2 2  '$ Language- repeat '1 1 1  '$ Language- follow 3 step command '3 3 3  '$ Language- read & follow direction '1 1 1  '$ Write a sentence '1 1 1  '$ Copy design 0 0 1  Total score '23 25 30      '$ 02/29/2020   11:00 AM  Montreal Cognitive Assessment   Visuospatial/ Executive (0/5) 1  Naming (0/3) 3  Attention: Read list of digits (0/2) 1  Attention: Read list of letters (0/1) 1  Attention: Serial 7 subtraction starting at 100 (0/3) 1  Language: Repeat phrase (0/2) 1  Language : Fluency (0/1) 0  Abstraction (0/2) 1  Delayed Recall (0/5) 0  Orientation (0/6) 4  Total 13  Adjusted Score (based on  education) 14    Objective:     PHYSICAL EXAMINATION:    VITALS:   Vitals:   02/22/22 1133  BP: 126/76  Pulse: 68  Resp: 20  SpO2: 98%  Weight: 168 lb (76.2 kg)  Height: '5\' 6"'$  (1.676 m)    GEN:  The patient appears stated age and is in NAD. HEENT:  Normocephalic, atraumatic.   Neurological examination:  General: NAD, well-groomed, appears stated age. Orientation: The patient is alert. Oriented to person, place not to date Cranial nerves: There is good facial symmetry.The speech is fluent and clear. No aphasia or dysarthria. Fund of knowledge is appropriate. Recent memory impaired, remote memory is normal. Attention and concentration are reduced.  Able to name objects and repeat phrases.  Hearing is intact to conversational tone.    Sensation: Sensation is intact to light touch throughout Motor: Strength is at least antigravity x4. DTR's 2/4 in UE/LE     Movement examination: Tone: There is normal tone in the UE/LE Abnormal movements:  no tremor.  No myoclonus.  No asterixis.   Coordination:  There is no decremation with RAM's. Normal finger to nose  Gait and Station: The patient has no difficulty arising out of a deep-seated chair without the use of the hands. The patient's stride length is shorter than prior exam. Gait is cautious and narrow.    Thank you for allowing [place] the opportunity to participate in the care of this nice patient. Please do not hesitate to contact us for any questions or concerns.   Total time spent on today's visit was 31 minutes dedicated to this patient today, preparing to see patient, examining the patient, ordering tests and/or medications and counseling the patient, documenting clinical information in the EHR or other health record, independently interpreting results and communicating results to the patient/family, discussing treatment and goals, answering patient's questions and coordinating care.  Cc:  Algis Liming  [facility] 02/22/2022 12:25 PM

## 2022-03-12 ENCOUNTER — Encounter: Payer: Self-pay | Admitting: Family Medicine

## 2022-03-12 ENCOUNTER — Ambulatory Visit (INDEPENDENT_AMBULATORY_CARE_PROVIDER_SITE_OTHER): Payer: Medicare HMO | Admitting: Family Medicine

## 2022-03-12 ENCOUNTER — Ambulatory Visit (HOSPITAL_BASED_OUTPATIENT_CLINIC_OR_DEPARTMENT_OTHER)
Admission: RE | Admit: 2022-03-12 | Discharge: 2022-03-12 | Disposition: A | Discharge status: Home / Self Care | Payer: Medicare HMO | Source: Ambulatory Visit | Attending: Family Medicine | Admitting: Family Medicine

## 2022-03-12 VITALS — BP 133/66 | HR 65 | Temp 97.9°F | Ht 66.0 in | Wt 169.4 lb

## 2022-03-12 DIAGNOSIS — R110 Nausea: Secondary | ICD-10-CM

## 2022-03-12 DIAGNOSIS — R1013 Epigastric pain: Secondary | ICD-10-CM | POA: Diagnosis not present

## 2022-03-12 NOTE — Patient Instructions (Addendum)
Start taking the Protonix (pantoprazole) again - looks like you should have plenty plus refills at the pharmacy. You can also try taking Pepcid once a day as well. Labs today Xray of your abdomen Continue Zofran as needed Consider GI referral if workup unremarkable and symptoms persist  Please contact office for follow-up if symptoms do not improve or worsen. Seek emergency care if symptoms become severe.

## 2022-03-12 NOTE — Progress Notes (Signed)
Acute Office Visit  Subjective:     Patient ID: Shawn [name], male    DOB: 13-May-1934, 88 y.o.   MRN: [id number]  Chief Complaint  Patient presents with   GI Problem    Patient is in today for nausea.  Patient is here with his wife. They report he has been struggling with nausea for the past few months. Reports that he feels full quickly, if he eats normal amounts of food, he feels like he is going to vomit. He feels nauseous in spells but occurring every day. He cannot pinpoint any trigger foods or timing of day. He reports the queasy feeling is primarily epigastric region, but he denies any abdominal pain or heartburn/indigestion. He tried some leftover Zofran when did not make much difference. Tums has offered some temporary relief. He denies any fevers, chills, pain, vomiting, diarrhea, blood in stool. He has felt like his stools are harder than usual, but he is going daily. LBM was a few hours ago. He was able to eat lunch but not much (yogurt, fruit, toast) and is currently feeling nauseous. Wife reports he has had a peptic ulcer in the past. He denies any GU symptoms or distention and reports he is passing gas as usual.     ROS All review of systems negative except what is listed in the HPI      Objective:    BP 133/66   Pulse 65   Temp 97.9 F (36.6 C)   Ht '5\' 6"'$  (1.676 m)   Wt 169 lb 6.4 oz (76.8 kg)   SpO2 98%   BMI 27.34 kg/m    Physical Exam Vitals reviewed.  Constitutional:      Appearance: Normal appearance.  Cardiovascular:     Rate and Rhythm: Normal rate and regular rhythm.     Pulses: Normal pulses.     Heart sounds: Murmur heard.  Pulmonary:     Effort: Pulmonary effort is normal.     Breath sounds: Normal breath sounds.  Abdominal:     General: Abdomen is flat. Bowel sounds are increased. There is no distension.     Palpations: There is no mass.     Tenderness: There is no abdominal tenderness. There is no guarding or rebound.      Hernia: No hernia is present.  Skin:    General: Skin is warm and dry.  Neurological:     Mental Status: He is alert and oriented to person, place, and time.  Psychiatric:        Mood and Affect: Mood normal.        Behavior: Behavior normal.        Thought Content: Thought content normal.        Judgment: Judgment normal.     No results found for any visits on 03/12/22.      Assessment & Plan:   Problem List Items Addressed This Visit   None Visit Diagnoses     Nausea    -  Primary   Relevant Orders   CBC with Differential/Platelet   Comprehensive metabolic panel   Lipase   H. pylori antibody, IgG   DG Abd 1 View   Epigastric discomfort       Relevant Orders   CBC with Differential/Platelet   Comprehensive metabolic panel   Lipase   H. pylori antibody, IgG   DG Abd 1 View      Start taking the Protonix (pantoprazole) again - looks like you  should have plenty plus refills at the pharmacy. You can also try taking Pepcid once a day as well. Labs today Xray of your abdomen Continue Zofran as needed Consider GI referral if workup unremarkable and symptoms persist  Please contact office for follow-up if symptoms do not improve or worsen. Seek emergency care if symptoms become severe.      No orders of the defined types were placed in this encounter.   Return if symptoms worsen or fail to improve.  Terrilyn Saver, NP

## 2022-03-13 LAB — COMPREHENSIVE METABOLIC PANEL
ALT: 25 U/L (ref 0–53)
AST: 24 U/L (ref 0–37)
Albumin: 4.4 g/dL (ref 3.5–5.2)
Alkaline Phosphatase: 63 U/L (ref 39–117)
BUN: 21 mg/dL (ref 6–23)
CO2: 31 mEq/L (ref 19–32)
Calcium: 9.7 mg/dL (ref 8.4–10.5)
Chloride: 99 mEq/L (ref 96–112)
Creatinine, Ser: 1.06 mg/dL (ref 0.40–1.50)
GFR: 64.16 mL/min (ref >60.00)
Glucose, Bld: 87 mg/dL (ref 70–99)
Potassium: 4.5 mEq/L (ref 3.5–5.1)
Sodium: 141 mEq/L (ref 135–145)
Total Bilirubin: 0.9 mg/dL (ref 0.2–1.2)
Total Protein: 7.2 g/dL (ref 6.0–8.3)

## 2022-03-13 LAB — CBC WITH DIFFERENTIAL/PLATELET
Basophils Absolute: 0 10*3/uL (ref 0.0–0.1)
Basophils Relative: 0.7 % (ref 0.0–3.0)
Eosinophils Absolute: 0.1 10*3/uL (ref 0.0–0.7)
Eosinophils Relative: 2.7 % (ref 0.0–5.0)
HCT: 44.7 % (ref 39.0–52.0)
Hemoglobin: 15.7 g/dL (ref 13.0–17.0)
Lymphocytes Relative: 29.8 % (ref 12.0–46.0)
Lymphs Abs: 1.5 10*3/uL (ref 0.7–4.0)
MCHC: 35.1 g/dL (ref 30.0–36.0)
MCV: 98.8 fl (ref 78.0–100.0)
Monocytes Absolute: 0.5 10*3/uL (ref 0.1–1.0)
Monocytes Relative: 9.5 % (ref 3.0–12.0)
Neutro Abs: 2.9 10*3/uL (ref 1.4–7.7)
Neutrophils Relative %: 57.3 % (ref 43.0–77.0)
Platelets: 159 10*3/uL (ref 150.0–400.0)
RBC: 4.53 Mil/uL (ref 4.22–5.81)
RDW: 12.8 % (ref 11.5–15.5)
WBC: 5.1 10*3/uL (ref 4.0–10.5)

## 2022-03-13 LAB — H. PYLORI ANTIBODY, IGG: H Pylori IgG: NEGATIVE

## 2022-03-13 LAB — LIPASE: Lipase: 64 U/L — ABNORMAL HIGH (ref 11.0–59.0)

## 2022-04-18 NOTE — Progress Notes (Signed)
Cardiology Office Note   Date:  04/20/2022   ID:  Shawn [name], DOB 03/11/1934, MRN [id number]  PCP:  Carollee Herter, Alferd Apa, DO  Cardiologist:   Minus Breeding, MD   Chief Complaint  Patient presents with   Aortic Stenosis     History of Present Illness:  Shawn [name] is a 88 y.o. male will present for office visit. He has past medical history of mild aortic valve sclerosis with cardiac murmur, hyperlipidemia, factor V Leiden mutation and history of DVT/PE on coumadin.   He was seen in July last year for near syncope.    He presents for follow-up.  I did review the records from his hospitalization in July of last year when he had a TIA.  He was thought to be hypercoagulable.  There were no acute abnormalities on CTA.  He had no stroke noted on MRI.  His echocardiogram demonstrated his AAS to still be moderate in his aorta to be 38 mm.  He was continued on home Eliquis.  He walks with his wife.  He is not having any new chest pressure, neck or arm discomfort.  He is not having any new shortness of breath, PND or orthopnea.  He has had no new palpitations, presyncope or syncope.  He is weak following her hernia surgery.   Past Medical History:  Diagnosis Date   Allergy    Cardiac murmur    aortic sclerosis; moderate AS 08/11/21   Cataract    left eye   Clotting disorder ([name])    Factor v Leiden factor mutation   Colon polyps    Hyperplastic   Diverticulosis    Dysphagia    Factor V Leiden mutation ([place])    Hemorrhoids    History of blood clots    Hyperlipidemia    Osteoarthritis    Pulmonary embolism ([place]) 11/20/2009    Past Surgical History:  Procedure Laterality Date   APPENDECTOMY     CATARACT EXTRACTION  02/09/2007   right eye   INGUINAL HERNIA REPAIR     INGUINAL HERNIA REPAIR Right 01/05/2022   Procedure: RIGHT HERNIA REPAIR RECURRENT INGUINAL WITH THREE BY SIX POLYPROPYLENE MESH;  Surgeon: Georganna Skeans, MD;  Location: [facility];   Service: General;  Laterality: Right;  GEN AND TAP BLOCK   TONSILLECTOMY     VARICOSE VEIN SURGERY     VASECTOMY       Current Outpatient Medications  Medication Sig Dispense Refill   ascorbic acid (VITAMIN C) 500 MG tablet Take 500 mg by mouth daily with lunch.     Cholecalciferol 125 MCG (5000 UT) TABS Take 5,000 Units by mouth every Monday, Tuesday, Wednesday, Thursday, and Friday.  With lunch     donepezil (ARICEPT) 10 MG tablet Take 1 tablet daily 180 tablet 1   ELIQUIS 5 MG TABS tablet TAKE 1 TABLET BY MOUTH TWICE A DAY 180 tablet 1   ezetimibe (ZETIA) 10 MG tablet Take 1 tablet (10 mg total) by mouth daily. 90 tablet 3   Multiple Vitamin (MULTIVITAMIN WITH MINERALS) TABS tablet Take 1 tablet by mouth daily with lunch.     pantoprazole (PROTONIX) 40 MG tablet Take 1 tablet (40 mg total) by mouth daily. 90 tablet 3   saw palmetto 160 MG capsule Take 320 mg by mouth daily with lunch.     sertraline (ZOLOFT) 100 MG tablet Take 100 mg by mouth daily.     No current facility-administered  medications for this visit.    Allergies:   Crestor [rosuvastatin], Niacin, and Pravastatin    ROS:  Please see the history of present illness.   Otherwise, review of systems are positive for none.   All other systems are reviewed and negative.    PHYSICAL EXAM: VS:  BP 126/76   Pulse 67   Ht 5\' 6"  (1.676 m)   Wt 168 lb 6.4 oz (76.4 kg)   SpO2 93%   BMI 27.18 kg/m  , BMI Body mass index is 27.18 kg/m.  GENERAL:  Well appearing NECK:  No jugular venous distention, waveform within normal limits, carotid upstroke brisk and symmetric, no bruits, no thyromegaly LUNGS:  Clear to auscultation bilaterally CHEST:  Unremarkable HEART:  PMI not displaced or sustained,S1 and S2 within normal limits, no S3, no S4, no clicks, no rubs, 2 out of 6 apical systolic murmur radiating slightly at the old catheter tract, no diastolic murmurs ABD:  Flat, positive bowel sounds normal in frequency in pitch, no  bruits, no rebound, no guarding, no midline pulsatile mass, no hepatomegaly, no splenomegaly EXT:  2 plus pulses throughout, no edema, no cyanosis no clubbing  EKG:  EKG is  ordered today. Sinus rhythm, rate 67, axis within normal limits, intervals within normal limits, no acute ST-T wave changes.  Recent Labs: 01/15/2022: TSH 1.59 03/12/2022: ALT 25; BUN 21; Creatinine, Ser 1.06; Hemoglobin 15.7; Platelets 159.0; Potassium 4.5; Sodium 141    Lipid Panel    Component Value Date/Time   CHOL 164 01/15/2022 0944   TRIG 176.0 (H) 01/15/2022 0944   HDL 42.40 01/15/2022 0944   CHOLHDL 4 01/15/2022 0944   VLDL 35.2 01/15/2022 0944   LDLCALC 87 01/15/2022 0944   LDLDIRECT 86.0 06/17/2014 1008      Wt Readings from Last 3 Encounters:  04/20/22 168 lb 6.4 oz (76.4 kg)  03/12/22 169 lb 6.4 oz (76.8 kg)  02/22/22 168 lb (76.2 kg)      Other studies Reviewed: Additional studies/ records that were reviewed today include:  Hospital records Review of the above records demonstrates: See elsewhere   ASSESSMENT AND PLAN:  DVT/PE:    He is on chronic anticoagulation.  No change in therapy.  He is on lifelong anticoagulation.   AS:   This was moderate on echo in July.  I do not think he needs an echo this year.  I will likely order another one next year and I will see him prior to that.  Again I did review the echo that was done during his hospitalization in July of last year.  DYSLIPIDEMIA:   His LDL was 87 with an HDL of 42.4.  No change in therapy.  HTN:    His blood pressure is at target.  No change in therapy.  Current medicines are reviewed at length with the patient today.  The patient does not have concerns regarding medicines.  The following changes have been made:    None  Labs/ tests ordered today include:    None Orders Placed This Encounter  Procedures   EKG 12-Lead     Disposition:   FU with me in one year.    Signed, Minus Breeding, MD  04/20/2022 11:23 AM     [facility]

## 2022-04-20 ENCOUNTER — Ambulatory Visit: Payer: Medicare HMO | Attending: Cardiology | Admitting: Cardiology

## 2022-04-20 ENCOUNTER — Encounter: Payer: Self-pay | Admitting: Cardiology

## 2022-04-20 VITALS — BP 126/76 | HR 67 | Ht 66.0 in | Wt 168.4 lb

## 2022-04-20 DIAGNOSIS — I359 Nonrheumatic aortic valve disorder, unspecified: Secondary | ICD-10-CM | POA: Diagnosis not present

## 2022-04-20 DIAGNOSIS — I10 Essential (primary) hypertension: Secondary | ICD-10-CM

## 2022-04-20 DIAGNOSIS — E785 Hyperlipidemia, unspecified: Secondary | ICD-10-CM

## 2022-04-20 NOTE — Patient Instructions (Signed)
Medication Instructions:  Your physician recommends that you continue on your current medications as directed. Please refer to the Current Medication list given to you today.  *If you need a refill on your cardiac medications before your next appointment, please call your pharmacy*  Follow-Up: At [facility], you and your health needs are our priority.  As part of our continuing mission to provide you with exceptional heart care, we have created designated Provider Care Teams.  These Care Teams include your primary Cardiologist (physician) and Advanced Practice Providers (APPs -  Physician Assistants and Nurse Practitioners) who all work together to provide you with the care you need, when you need it.  We recommend signing up for the patient portal called "MyChart".  Sign up information is provided on this After Visit Summary.  MyChart is used to connect with patients for Virtual Visits (Telemedicine).  Patients are able to view lab/test results, encounter notes, upcoming appointments, etc.  Non-urgent messages can be sent to your provider as well.   To learn more about what you can do with MyChart, go to [email or web address].    Your next appointment:   12 month(s)  Provider:   Dr. Percival Spanish

## 2022-05-10 DIAGNOSIS — I2782 Chronic pulmonary embolism: Secondary | ICD-10-CM | POA: Diagnosis not present

## 2022-05-10 DIAGNOSIS — F0390 Unspecified dementia without behavioral disturbance: Secondary | ICD-10-CM | POA: Diagnosis not present

## 2022-05-10 DIAGNOSIS — Z7901 Long term (current) use of anticoagulants: Secondary | ICD-10-CM | POA: Diagnosis not present

## 2022-05-10 DIAGNOSIS — M1990 Unspecified osteoarthritis, unspecified site: Secondary | ICD-10-CM | POA: Diagnosis not present

## 2022-05-10 DIAGNOSIS — R69 Illness, unspecified: Secondary | ICD-10-CM | POA: Diagnosis not present

## 2022-05-10 DIAGNOSIS — E785 Hyperlipidemia, unspecified: Secondary | ICD-10-CM | POA: Diagnosis not present

## 2022-05-10 DIAGNOSIS — K219 Gastro-esophageal reflux disease without esophagitis: Secondary | ICD-10-CM | POA: Diagnosis not present

## 2022-05-10 DIAGNOSIS — I10 Essential (primary) hypertension: Secondary | ICD-10-CM | POA: Diagnosis not present

## 2022-05-10 DIAGNOSIS — F419 Anxiety disorder, unspecified: Secondary | ICD-10-CM | POA: Diagnosis not present

## 2022-05-10 DIAGNOSIS — Z8673 Personal history of transient ischemic attack (TIA), and cerebral infarction without residual deficits: Secondary | ICD-10-CM | POA: Diagnosis not present

## 2022-05-23 ENCOUNTER — Other Ambulatory Visit: Payer: Self-pay | Admitting: Physician Assistant

## 2022-07-05 DIAGNOSIS — R351 Nocturia: Secondary | ICD-10-CM | POA: Diagnosis not present

## 2022-07-05 DIAGNOSIS — R3912 Poor urinary stream: Secondary | ICD-10-CM | POA: Diagnosis not present

## 2022-07-05 DIAGNOSIS — R350 Frequency of micturition: Secondary | ICD-10-CM | POA: Diagnosis not present

## 2022-07-05 DIAGNOSIS — N401 Enlarged prostate with lower urinary tract symptoms: Secondary | ICD-10-CM | POA: Diagnosis not present

## 2022-07-05 DIAGNOSIS — R3915 Urgency of urination: Secondary | ICD-10-CM | POA: Diagnosis not present

## 2022-08-16 ENCOUNTER — Ambulatory Visit: Payer: Medicare HMO | Admitting: Physician Assistant

## 2022-08-16 ENCOUNTER — Encounter: Payer: Self-pay | Admitting: Physician Assistant

## 2022-08-16 VITALS — BP 121/72 | HR 61 | Resp 18 | Ht 66.0 in | Wt 166.0 lb

## 2022-08-16 DIAGNOSIS — R413 Other amnesia: Secondary | ICD-10-CM | POA: Diagnosis not present

## 2022-08-16 DIAGNOSIS — F03918 Unspecified dementia, unspecified severity, with other behavioral disturbance: Secondary | ICD-10-CM

## 2022-08-16 MED ORDER — DONEPEZIL HCL 10 MG PO TABS: ORAL_TABLET | ORAL | 3 refills | Status: DC

## 2022-08-16 NOTE — Patient Instructions (Signed)
It was a pleasure to see you today at our office.   Recommendations:  Follow up in 6  months  Continue donepezil 10 mg daily.   Repeat Neuropsych evaluation for clarity of diagnosis      Whom to call:  Memory  decline, memory medications: Call our office [phone]   For psychiatric meds, mood meds: Please have your primary care physician manage these medications.   Counseling regarding caregiver distress, including caregiver depression, anxiety and issues regarding community resources, adult day care programs, adult living facilities, or memory care questions:   Feel free to contact Misty Lisabeth Register, Social Worker at [phone]   For assessment of decision of mental capacity and competency:  Call Dr. Erick Blinks, geriatric psychiatrist at [phone]  For guidance in geriatric dementia issues please call [facility] [phone]  For guidance regarding [facility] and if placement were needed at the facility, contact Sidney Ace, Social Worker tel: [phone]  Consider [facility]  [address], [place] [address] [phone]  Hours of Operation Mondays to Thursdays: 8 am to 8 pm,Fridays: 9 am to 8 pm, Saturdays: 9 am to 1 pm Sundays: Closed  [email or web address]  If you have any severe symptoms of a stroke, or other severe issues such as confusion,severe chills or fever, etc call 911 or go to the ER as you may need to be evaluated further   Feel free to visit Facebook page " [employer]" for tips of how to care for people with memory problems.     RECOMMENDATIONS FOR ALL PATIENTS WITH MEMORY PROBLEMS: 1. Continue to exercise (Recommend 30 minutes of walking everyday, or 3 hours every week) 2. Increase social interactions - continue going to [place] and enjoy social gatherings with friends and family 3. Eat healthy, avoid fried  foods and eat more fruits and vegetables 4. Maintain adequate blood pressure, blood sugar, and blood cholesterol level. Reducing the risk of stroke and cardiovascular disease also helps promoting better memory. 5. Avoid stressful situations. Live a simple life and avoid aggravations. Organize your time and prepare for the next day in anticipation. 6. Sleep well, avoid any interruptions of sleep and avoid any distractions in the bedroom that may interfere with adequate sleep quality 7. Avoid sugar, avoid sweets as there is a strong link between excessive sugar intake, diabetes, and cognitive impairment We discussed the Mediterranean diet, which has been shown to help patients reduce the risk of progressive memory disorders and reduces cardiovascular risk. This includes eating fish, eat fruits and green leafy vegetables, nuts like almonds and hazelnuts, walnuts, and also use olive oil. Avoid fast foods and fried foods as much as possible. Avoid sweets and sugar as sugar use has been linked to worsening of memory function.  There is always a concern of gradual progression of memory problems. If this is the case, then we may need to adjust level of care according to patient needs. Support, both to the patient and caregiver, should then be put into place.    The Alzheimer's Association is here all day, every day for people facing Alzheimer's disease through our free 24/7 Helpline: (469)497-6886. The Helpline provides reliable information and support to all those who need assistance, such as individuals living with memory loss, Alzheimer's or other dementia, caregivers, health care professionals and the public.  Our highly trained and knowledgeable staff can help you with: Understanding memory loss, dementia and Alzheimer's  Medications and other treatment options  General information about  aging and brain health  Skills to provide quality care and to find the best care from professionals  Legal, financial  and living-arrangement decisions Our Helpline also features: Confidential care consultation provided by master's level clinicians who can help with decision-making support, crisis assistance and education on issues families face every day  Help in a caller's preferred language using our translation service that features more than 200 languages and dialects  Referrals to local community programs, services and ongoing support     FALL PRECAUTIONS: Be cautious when walking. Scan the area for obstacles that may increase the risk of trips and falls. When getting up in the mornings, sit up at the edge of the bed for a few minutes before getting out of bed. Consider elevating the bed at the head end to avoid drop of blood pressure when getting up. Walk always in a well-lit room (use night lights in the walls). Avoid area rugs or power cords from appliances in the middle of the walkways. Use a walker or a cane if necessary and consider physical therapy for balance exercise. Get your eyesight checked regularly.  FINANCIAL OVERSIGHT: Supervision, especially oversight when making financial decisions or transactions is also recommended.  HOME SAFETY: Consider the safety of the kitchen when operating appliances like stoves, microwave oven, and blender. Consider having supervision and share cooking responsibilities until no longer able to participate in those. Accidents with firearms and other hazards in the house should be identified and addressed as well.   ABILITY TO BE LEFT ALONE: If patient is unable to contact 911 operator, consider using LifeLine, or when the need is there, arrange for someone to stay with patients. Smoking is a fire hazard, consider supervision or cessation. Risk of wandering should be assessed by caregiver and if detected at any point, supervision and safe proof recommendations should be instituted.  MEDICATION SUPERVISION: Inability to self-administer medication needs to be constantly  addressed. Implement a mechanism to ensure safe administration of the medications.   DRIVING: Regarding driving, in patients with progressive memory problems, driving will be impaired. We advise to have someone else do the driving if trouble finding directions or if minor accidents are reported. Independent driving assessment is available to determine safety of driving.   If you are interested in the driving assessment, you can contact the following:  [employer] in [place] [phone]  [facility] [phone]  [facility] [phone] or [phone]      Mediterranean Diet A Mediterranean diet refers to food and lifestyle choices that are based on the traditions of countries located on the [employer]. This way of eating has been shown to help prevent certain conditions and improve outcomes for people who have chronic diseases, like kidney disease and heart disease. What are tips for following this plan? Lifestyle  Cook and eat meals together with your family, when possible. Drink enough fluid to keep your urine clear or pale yellow. Be physically active every day. This includes: Aerobic exercise like running or swimming. Leisure activities like gardening, walking, or housework. Get 7-8 hours of sleep each night. If recommended by your health care provider, drink red wine in moderation. This means 1 glass a day for nonpregnant women and 2 glasses a day for men. A glass of wine equals 5 oz (150 mL). Reading food labels  Check the serving size of packaged foods. For foods such as rice and pasta, the serving size refers to the amount of  cooked product, not dry. Check the total fat in packaged foods. Avoid foods that have saturated fat or trans fats. Check the ingredients list for added sugars, such as corn syrup. Shopping  At the grocery store, buy most of your food from the areas near the walls of  the store. This includes: Fresh fruits and vegetables (produce). Grains, beans, nuts, and seeds. Some of these may be available in unpackaged forms or large amounts (in bulk). Fresh seafood. Poultry and eggs. Low-fat dairy products. Buy whole ingredients instead of prepackaged foods. Buy fresh fruits and vegetables in-season from local farmers markets. Buy frozen fruits and vegetables in resealable bags. If you do not have access to quality fresh seafood, buy precooked frozen shrimp or canned fish, such as tuna, salmon, or sardines. Buy small amounts of raw or cooked vegetables, salads, or olives from the deli or salad bar at your store. Stock your pantry so you always have certain foods on hand, such as olive oil, canned tuna, canned tomatoes, rice, pasta, and beans. Cooking  Cook foods with extra-virgin olive oil instead of using butter or other vegetable oils. Have meat as a side dish, and have vegetables or grains as your main dish. This means having meat in small portions or adding small amounts of meat to foods like pasta or stew. Use beans or vegetables instead of meat in common dishes like chili or lasagna. Experiment with different cooking methods. Try roasting or broiling vegetables instead of steaming or sauteing them. Add frozen vegetables to soups, stews, pasta, or rice. Add nuts or seeds for added healthy fat at each meal. You can add these to yogurt, salads, or vegetable dishes. Marinate fish or vegetables using olive oil, lemon juice, garlic, and fresh herbs. Meal planning  Plan to eat 1 vegetarian meal one day each week. Try to work up to 2 vegetarian meals, if possible. Eat seafood 2 or more times a week. Have healthy snacks readily available, such as: Vegetable sticks with hummus. Greek yogurt. Fruit and nut trail mix. Eat balanced meals throughout the week. This includes: Fruit: 2-3 servings a day Vegetables: 4-5 servings a day Low-fat dairy: 2 servings a day Fish,  poultry, or lean meat: 1 serving a day Beans and legumes: 2 or more servings a week Nuts and seeds: 1-2 servings a day Whole grains: 6-8 servings a day Extra-virgin olive oil: 3-4 servings a day Limit red meat and sweets to only a few servings a month What are my food choices? Mediterranean diet Recommended Grains: Whole-grain pasta. Brown rice. Bulgar wheat. Polenta. Couscous. Whole-wheat bread. Orpah Cobb. Vegetables: Artichokes. Beets. Broccoli. Cabbage. Carrots. Eggplant. Green beans. Chard. Kale. Spinach. Onions. Leeks. Peas. Squash. Tomatoes. Peppers. Radishes. Fruits: Apples. Apricots. Avocado. Berries. Bananas. Cherries. Dates. Figs. Grapes. Lemons. Melon. Oranges. Peaches. Plums. Pomegranate. Meats and other protein foods: Beans. Almonds. Sunflower seeds. Pine nuts. Peanuts. Cod. Salmon. Scallops. Shrimp. Tuna. Tilapia. Clams. Oysters. Eggs. Dairy: Low-fat milk. Cheese. Greek yogurt. Beverages: Water. Red wine. Herbal tea. Fats and oils: Extra virgin olive oil. Avocado oil. Grape seed oil. Sweets and desserts: [place] yogurt with honey. Baked apples. Poached pears. Trail mix. Seasoning and other foods: Basil. Cilantro. Coriander. Cumin. Mint. Parsley. Sage. Rosemary. Tarragon. Garlic. Oregano. Thyme. Pepper. Balsalmic vinegar. Tahini. Hummus. Tomato sauce. Olives. Mushrooms. Limit these Grains: Prepackaged pasta or rice dishes. Prepackaged cereal with added sugar. Vegetables: Deep fried potatoes (french fries). Fruits: Fruit canned in syrup. Meats and other protein foods: Beef. Pork. Lamb. Poultry with skin. Hot dogs.  Tomasa Blase. Dairy: Ice cream. Sour cream. Whole milk. Beverages: Juice. Sugar-sweetened soft drinks. Beer. Liquor and spirits. Fats and oils: Butter. Canola oil. Vegetable oil. Beef fat (tallow). Lard. Sweets and desserts: Cookies. Cakes. Pies. Candy. Seasoning and other foods: Mayonnaise. Premade sauces and marinades. The items listed may not be a complete list. Talk  with your dietitian about what dietary choices are right for you. Summary The Mediterranean diet includes both food and lifestyle choices. Eat a variety of fresh fruits and vegetables, beans, nuts, seeds, and whole grains. Limit the amount of red meat and sweets that you eat. Talk with your health care provider about whether it is safe for you to drink red wine in moderation. This means 1 glass a day for nonpregnant women and 2 glasses a day for men. A glass of wine equals 5 oz (150 mL). This information is not intended to replace advice given to you by your health care provider. Make sure you discuss any questions you have with your health care provider. Document Released: 09/15/2015 Document Revised: 10/18/2015 Document Reviewed: 09/15/2015 Elsevier Interactive Patient Education  2017 [employer].

## 2022-08-16 NOTE — Progress Notes (Signed)
Assessment/Plan:     Dementia with Behavioral Disturbance   Shawn [name] is a very pleasant 88 y.o. RH male with a history of hyperlipidemia, PE and DVT, Factor V Leiden deficiency on Eliquis, and a diagnosis of mild dementia (although the time in January 2022 did not meet clearly the full criteria, with atypical Alzheimer's as a differential with a vascular contribution), seen today in follow up for memory loss.  Patient is currently on donepezil 10 mg daily. MMSE today stable at 22/30     Follow up in 6  months. repeat Neuropsych evaluation for clarity of the diagnosis and disease trajectory  Continue donepezil 10 mg daily. Side effects were discussed  Recommend good control of her cardiovascular risk factors Follow up with Hematology on Factor V Leiden (history of PE/DVT/TIA) Continue to control mood as per PCP, continue Zoloft     Subjective:    This patient is accompanied in the office by his wife  who supplements the history.  Previous records as well as any outside records available were reviewed prior to todays visit. Patient was last seen on 02/22/22 with MMSE 23/30    Any changes in memory since last visit? "About the same" .Patient has some difficulty remembering recent conversations and people names . Enjoys jigsaw puzzles, does not like reading.  repeats oneself?  Endorsed, not excessively  Disoriented when walking into a room?  Patient denies    Leaving objects in unusual places?  May misplace things but not in unusual places   Wandering behavior?  denies   Any personality changes since last visit?  denies   Any worsening depression?:  Situational depression as friends dying off and inability to drive. He has declined psychotherapy in the past   Hallucinations or paranoia? At night he may experience sundowning, not frequently    Seizures? denies    Any sleep changes?  Denies vivid dreams," talks in his sleep a lot"  possible dream re-eneacting, sometimes  he reaches for something that is not there. Denies sleepwalking    Sleep apnea?   denies   Any hygiene concerns? denies  Independent of bathing and dressing?  Endorsed  Does the patient needs help with medications?  Patient is in charge   Who is in charge of the finances? Wife  is in charge, he participates too     Any changes in appetite? Poor appetite, he only wants ice-cream    Patient have trouble swallowing? denies   Does the patient cook? No Any headaches?   denies   Chronic back pain  denies   Ambulates with difficulty? Denies, wife reports that he is slower than before    Recent falls or head injuries? denies     Unilateral weakness, numbness or tingling? denies   Any tremors? Known minimal L hand tremor Any anosmia?  Patient denies   Any incontinence of urine?  Endorsed . Wears diapers Any bowel dysfunction?   denies      Patient lives with wife    Does the patient drive? No longer drives      History on Initial Assessment 04/22/2020: This is an 88 year old right-handed man with a history of hyperlipidemia, PE and DVT, Factor V Leiden deficiency, presenting for evaluation of dementia. His wife is present to provide additional information. 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 Atypical Alzheimer's is also a possibility. Vascular disease may be contributing but does not well explain his higher cortical signs. MRI  brain in 2017 showed moderate chronic microvascular disease and mild diffuse volume loss. Repeat MRI brain in 2022 showed progression of chronic microvascular disease, no acute changes.    He feels his memory is good, "not perfect." His wife started noticing forgetfulness and word-finding difficulties a couple of years ago. His handwriting has always been terrible, a little more illegible than before. He continues to drive and denies getting lost driving. He manages his own medications and denies missing doses. They do bills together. His wife denies any difficulties following instructions or using the remote control/microwave. He is independent with dressing and bathing. No personality changes, mood is "same as always," no paranoia or hallucinations.   They started noticing involuntary hand movements a few months ago. He has intermittent right arm myoclonus throughout the visit. They note he moves his fingers without realizing. Movements do not affect writing or using utensils. He has not noticed them in his legs. No falls. Sleep is good, no REM behavior disorder. He does fall asleep when he reads during the day. No staring/unresponsive episodes, gaps in time, olfactory/gustatory hallucinations. No change in gait, he denies any stiffness/difficulty with getting out of bed.    Laboratory Data:          Lab Results  Component Value Date    TSH 1.73 01/11/2020             Lab Results  Component Value Date    VITAMINB12 >1526 (H) 01/11/2020      PREVIOUS MEDICATIONS:   CURRENT MEDICATIONS:  Outpatient Encounter Medications as of 08/16/2022  Medication Sig   ascorbic acid (VITAMIN C) 500 MG tablet Take 500 mg by mouth daily with lunch.   Cholecalciferol 125 MCG (5000 UT) TABS Take 5,000 Units by mouth every Monday, Tuesday, Wednesday, Thursday, and Friday.  With lunch   cyanocobalamin (VITAMIN B12) 1000 MCG tablet Take 1,000 mcg by mouth daily.   ELIQUIS 5 MG TABS tablet TAKE 1 TABLET BY MOUTH  TWICE A DAY   ezetimibe (ZETIA) 10 MG tablet Take 1 tablet (10 mg total) by mouth daily.   Multiple Vitamin (MULTIVITAMIN WITH MINERALS) TABS tablet Take 1 tablet by mouth daily with lunch.   pantoprazole (PROTONIX) 40 MG tablet Take 1 tablet (40 mg total) by mouth daily.   saw palmetto 160 MG capsule Take 320 mg by mouth daily with lunch.   sertraline (ZOLOFT) 100 MG tablet Take 100 mg by mouth daily.   [DISCONTINUED] donepezil (ARICEPT) 10 MG tablet TAKE 1 TABLET BY MOUTH EVERY DAY   donepezil (ARICEPT) 10 MG tablet TAKE 1 TABLET BY MOUTH EVERY DAY   No facility-administered encounter medications on file as of 08/16/2022.       08/16/2022    1:00 PM 02/22/2022   12:00 PM 05/24/2021   11:00 AM  MMSE - Mini Mental State Exam  Orientation to time 1 1 4   Orientation to Place 3 5 4   Registration 3 3 3   Attention/ Calculation 5 5 5   Recall 2 1 1   Language- name 2 objects 2 2 2   Language-  repeat 1 1 1   Language- follow 3 step command 3 3 3   Language- read & follow direction 1 1 1   Write a sentence 1 1 1   Copy design 0 0 0  Total score 22 23 25       02/29/2020   11:00 AM  Montreal Cognitive Assessment   Visuospatial/ Executive (0/5) 1  Naming (0/3) 3  Attention: Read list of digits (0/2) 1  Attention: Read list of letters (0/1) 1  Attention: Serial 7 subtraction starting at 100 (0/3) 1  Language: Repeat phrase (0/2) 1  Language : Fluency (0/1) 0  Abstraction (0/2) 1  Delayed Recall (0/5) 0  Orientation (0/6) 4  Total 13  Adjusted Score (based on education) 14    Objective:     PHYSICAL EXAMINATION:    VITALS:   Vitals:   08/16/22 0859  BP: 121/72  Pulse: 61  Resp: 18  SpO2: 95%  Weight: 166 lb (75.3 kg)  Height: 5\' 6"  (1.676 m)    GEN:  The patient appears stated age and is in NAD. HEENT:  Normocephalic, atraumatic.   Neurological examination:  General: NAD, well-groomed, appears stated age. Orientation: The patient is alert. Oriented to person, place and  date Cranial nerves: There is good facial symmetry.The speech is fluent and clear. No aphasia or dysarthria. Fund of knowledge is appropriate. Recent and remote memory are impaired. Attention and concentration are reduced.  Able to name objects and repeat phrases.  Hearing is intact to conversational tone.   Sensation: Sensation is intact to light touch throughout Motor: Strength is at least antigravity x4. DTR's 2/4 in UE/LE     Movement examination: Tone: There is normal tone in the UE/LE Abnormal movements:  no tremor.  No myoclonus.  No asterixis.   Coordination:  There is no decremation with RAM's. Normal finger to nose  Gait and Station: The patient has no difficulty arising out of a deep-seated chair without the use of the hands. The patient's stride length is short but without change since prior visit .  Gait is cautious and narrow.    Thank you for allowing [place] the opportunity to participate in the care of this nice patient. Please do not hesitate to contact us for any questions or concerns.   Total time spent on today's visit was 30 minutes dedicated to this patient today, preparing to see patient, examining the patient, ordering tests and/or medications and counseling the patient, documenting clinical information in the EHR or other health record, independently interpreting results and communicating results to the patient/family, discussing treatment and goals, answering patient's questions and coordinating care.  Cc:  Donato Schultz, DO  [facility] 08/16/2022 1:03 PM

## 2022-08-23 ENCOUNTER — Ambulatory Visit: Payer: Medicare HMO | Admitting: Physician Assistant

## 2022-08-23 ENCOUNTER — Other Ambulatory Visit: Payer: Self-pay | Admitting: Family Medicine

## 2022-08-23 DIAGNOSIS — F418 Other specified anxiety disorders: Secondary | ICD-10-CM

## 2022-09-05 ENCOUNTER — Telehealth: Payer: Self-pay | Admitting: Family Medicine

## 2022-09-05 NOTE — Telephone Encounter (Signed)
Pt's spouse called and stated that [facility] notified the pt that the medication sertraline (ZOLOFT) 100 MG tablet was denied. After reviewing chart, it shows Dr. Laury Axon hasn't prescribed 100 mg in the past but 50 mg. Please advise Pt.

## 2022-09-05 NOTE — Telephone Encounter (Signed)
Please advise if patient is taking 50mg  or 100mg   of Zoloft. 100mg  is on pt's med list under historical provider.

## 2022-09-06 MED ORDER — SERTRALINE HCL 100 MG PO TABS: 100.0000 mg | ORAL_TABLET | Freq: Every day | ORAL | 1 refills | Status: DC

## 2022-09-06 NOTE — Addendum Note (Signed)
Addended by: Roxanne Gates on: 09/06/2022 11:45 AM   Modules accepted: Orders

## 2022-09-06 NOTE — Telephone Encounter (Signed)
Rx sent 

## 2022-09-07 NOTE — Telephone Encounter (Signed)
Per patient's wife, pt was taking 50 mg not the 100mg . See message below. Okay to take the 100mg ?

## 2022-09-07 NOTE — Telephone Encounter (Signed)
Pt's wife called to advise that the Sertraline that was called in was for 100 mg and Dr. Laury Axon has always sent in 50 mg so she is confused. Advised her of the note between Dr. Laury Axon and CMA and advised that another provider had prescribed the 100 mg in the past but wife is unaware of who that was. Wife wants to make sure before she gives him the medication. Please call her to advise.

## 2022-09-09 ENCOUNTER — Other Ambulatory Visit: Payer: Self-pay | Admitting: Family Medicine

## 2022-09-09 DIAGNOSIS — F418 Other specified anxiety disorders: Secondary | ICD-10-CM

## 2022-09-10 ENCOUNTER — Other Ambulatory Visit: Payer: Self-pay | Admitting: Family Medicine

## 2022-09-11 NOTE — Telephone Encounter (Signed)
Attempted to call pt. Phone continued to ring. VM did not pick up

## 2022-12-06 ENCOUNTER — Ambulatory Visit: Payer: Medicare HMO

## 2022-12-25 ENCOUNTER — Ambulatory Visit (INDEPENDENT_AMBULATORY_CARE_PROVIDER_SITE_OTHER): Payer: Medicare HMO

## 2022-12-25 VITALS — Ht 66.0 in | Wt 166.0 lb

## 2022-12-25 DIAGNOSIS — Z0000 Encounter for general adult medical examination without abnormal findings: Secondary | ICD-10-CM | POA: Diagnosis not present

## 2022-12-25 NOTE — Progress Notes (Signed)
Subjective:   Shawn [name] is a 88 y.o. male who presents for Medicare Annual/Subsequent preventive examination.  Visit Complete: Virtual I connected with  [name] on 12/25/22 by a audio enabled telemedicine application and verified that I am speaking with the correct person using two identifiers.  Patient Location: Home  Provider Location: Home Office  I discussed the limitations of evaluation and management by telemedicine. The patient expressed understanding and agreed to proceed.  Vital Signs: Because this visit was a virtual/telehealth visit, some criteria may be missing or patient reported. Any vitals not documented were not able to be obtained and vitals that have been documented are patient reported.    Cardiac Risk Factors include: advanced age (>19men, >53 women);male gender     Objective:    Today's Vitals   12/25/22 0857  Weight: 166 lb (75.3 kg)  Height: 5\' 6"  (1.676 m)   Body mass index is 26.79 kg/m.     12/25/2022    9:03 AM 08/16/2022    9:02 AM 02/22/2022   11:33 AM 12/21/2021    9:44 AM 12/20/2021   10:01 AM 11/10/2021    1:57 PM 09/22/2021   11:04 AM  Advanced Directives  Does Patient Have a Medical Advance Directive? Yes Yes Yes Yes Yes No Yes  Type of Estate agent of [place];Living will Healthcare Power of [employer] of [place];Living will Healthcare Power of [place];Living will  Healthcare Power of [place];Living will  Does patient want to make changes to medical advance directive?  No - Patient declined     No - Patient declined  Copy of Healthcare Power of Attorney in Chart? No - copy requested No - copy requested  No - copy requested No - copy requested    Would patient like information on creating a medical advance directive?      No - Patient declined     Current Medications (verified) Outpatient Encounter Medications as of 12/25/2022  Medication Sig   ascorbic acid  (VITAMIN C) 500 MG tablet Take 500 mg by mouth daily with lunch.   Cholecalciferol 125 MCG (5000 UT) TABS Take 5,000 Units by mouth every Monday, Tuesday, Wednesday, Thursday, and Friday.  With lunch   cyanocobalamin (VITAMIN B12) 1000 MCG tablet Take 1,000 mcg by mouth daily.   donepezil (ARICEPT) 10 MG tablet TAKE 1 TABLET BY MOUTH EVERY DAY   ELIQUIS 5 MG TABS tablet TAKE 1 TABLET BY MOUTH TWICE A DAY   ezetimibe (ZETIA) 10 MG tablet Take 1 tablet (10 mg total) by mouth daily.   Multiple Vitamin (MULTIVITAMIN WITH MINERALS) TABS tablet Take 1 tablet by mouth daily with lunch.   pantoprazole (PROTONIX) 40 MG tablet Take 1 tablet (40 mg total) by mouth daily.   saw palmetto 160 MG capsule Take 320 mg by mouth daily with lunch.   sertraline (ZOLOFT) 100 MG tablet Take 1 tablet (100 mg total) by mouth daily.   No facility-administered encounter medications on file as of 12/25/2022.    Allergies (verified) Crestor [rosuvastatin], Niacin, and Pravastatin   History: Past Medical History:  Diagnosis Date   Allergy    Cardiac murmur    aortic sclerosis; moderate AS 08/11/21   Cataract    left eye   Clotting disorder (HCC)    Factor v Leiden factor mutation   Colon polyps    Hyperplastic   Diverticulosis    Dysphagia    Factor V Leiden mutation (HCC)  Hemorrhoids    History of blood clots    Hyperlipidemia    Osteoarthritis    Pulmonary embolism (HCC) 11/20/2009   Past Surgical History:  Procedure Laterality Date   APPENDECTOMY     CATARACT EXTRACTION  02/09/2007   right eye   INGUINAL HERNIA REPAIR     INGUINAL HERNIA REPAIR Right 01/05/2022   Procedure: RIGHT HERNIA REPAIR RECURRENT INGUINAL WITH THREE BY SIX POLYPROPYLENE MESH;  Surgeon: Violeta Gelinas, MD;  [facility] OR;  Service: General;  Laterality: Right;  GEN AND TAP BLOCK   TONSILLECTOMY     VARICOSE VEIN SURGERY     VASECTOMY     Family History  Problem Relation Age of Onset   Lung cancer Father    Heart  attack Mother 2   Heart disease Mother        chf   Arthritis Mother    Colon cancer Neg Hx    Esophageal cancer Neg Hx    Rectal cancer Neg Hx    Stomach cancer Neg Hx    Social History   Socioeconomic History   Marital status: Married    Spouse name: Not on file   Number of children: 4   Years of education: Not on file   Highest education level: Not on file  Occupational History   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    Right handed   One story home   Social Determinants of Health   Financial Resource Strain: Low Risk  (12/25/2022)   Overall Financial Resource Strain (CARDIA)    Difficulty of Paying Living Expenses: Not hard at all  Food Insecurity: No Food Insecurity (12/25/2022)   Hunger Vital Sign    Worried About Running Out of Food in the Last Year: Never true    Ran Out of Food in the Last Year: Never true  Transportation Needs: No Transportation Needs (12/25/2022)   PRAPARE - Administrator, Civil Service (Medical): No    Lack of Transportation (Non-Medical): No  Physical Activity: Insufficiently Active (12/25/2022)   Exercise Vital Sign    Days of Exercise per Week: 5 days    Minutes of Exercise per Session: 20 min  Stress: No Stress Concern Present (12/25/2022)   [employer] of Occupational Health - Occupational Stress Questionnaire    Feeling of Stress : Not at all  Social Connections: Moderately Isolated (12/25/2022)   Social  Connection and Isolation Panel [NHANES]    Frequency of Communication with Friends and Family: More than three times a week    Frequency of Social Gatherings with Friends and Family: More than three times a week    Attends Religious Services: Never    Database administrator or Organizations: No    Attends Banker Meetings: Never    Marital Status: Married  Tobacco Counseling Counseling given: Not Answered   Clinical Intake:  Pre-visit preparation completed: Yes  Pain : No/denies pain     BMI - recorded: 26.79 Nutritional Status: BMI 25 -29 Overweight Nutritional Risks: None Diabetes: No  How often do you need to have someone help you when you read instructions, pamphlets, or other written materials from your doctor or pharmacy?: 1 - Never  Interpreter Needed?: No  Information entered by :: Theresa Mulligan LPN   Activities of Daily Living    12/25/2022    9:02 AM  In your present state of health, do you have any difficulty performing the following activities:  Hearing? 0  Vision? 0  Difficulty concentrating or making decisions? 0  Walking or climbing stairs? 0  Dressing or bathing? 0  Doing errands, shopping? 0  Preparing Food and eating ? N  Using the Toilet? N  In the past six months, have you accidently leaked urine? N  Do you have problems with loss of bowel control? N  Managing your Medications? N  Managing your Finances? N  Housekeeping or managing your Housekeeping? N    Patient Care Team: Zola Button, Grayling Congress, DO as PCP - General Rollene Rotunda, MD as Consulting Physician (Cardiology) Sallye Lat, MD as Consulting Physician (Ophthalmology)  Indicate any recent Medical Services you may have received from other than [facility] providers in the past year (date may be approximate).     Assessment:   This is a routine wellness examination for [place].  Hearing/Vision screen Hearing Screening - Comments:: Denies hearing difficulties    Vision Screening - Comments:: Wears rx glasses - up to date with routine eye exams with  [facility]   Goals Addressed               This Visit's Progress     Patient Stated (pt-stated)        Maintain current health!       Depression Screen    12/25/2022    9:02 AM 12/20/2021    9:58 AM 10/30/2021    1:20 PM 10/18/2020    8:27 AM 01/11/2020    8:31 AM 01/06/2019    9:21 AM 12/30/2017    7:59 AM  PHQ 2/9 Scores  PHQ - 2 Score 0 0 3 0 0 0 0  PHQ- 9 Score  0 15    3    Fall Risk    12/25/2022    9:03 AM 08/16/2022    9:02 AM 02/22/2022   11:33 AM 12/20/2021   10:02 AM 08/22/2021    9:09 AM  Fall Risk   Falls in the past year? 0 0 0 1 1  Number falls in past yr: 0 0 0 1 1  Injury with Fall? 0 0 0 1 1  Risk for fall due to : No Fall Risks   History of fall(s);Impaired balance/gait   Follow up Falls prevention discussed Falls evaluation completed Falls evaluation completed Education provided;Falls prevention discussed     MEDICARE RISK AT HOME: Medicare Risk at Home Any stairs in or around the home?: No If so, are there any without handrails?: No Home free of loose throw rugs in walkways, pet beds, electrical cords, etc?: Yes Adequate lighting in your home to reduce risk of falls?: Yes Life alert?: No Use of a cane, walker or w/c?: No Grab bars in the bathroom?: Yes Shower chair or bench in shower?: No Elevated toilet seat or a handicapped toilet?: No  TIMED  UP AND GO:  Was the test performed?  No    Cognitive Function:    08/16/2022    1:00 PM 02/22/2022   12:00 PM 05/24/2021   11:00 AM 12/22/2015   10:02 AM  MMSE - Mini Mental State Exam  Orientation to time 1 1 4 5   Orientation to Place 3 5 4 5   Registration 3 3 3 3   Attention/ Calculation 5 5 5 5   Recall 2 1 1 3   Language- name 2 objects 2 2 2 2   Language- repeat 1 1 1 1   Language- follow 3 step command 3 3 3 3   Language- read & follow direction 1 1 1 1   Write a sentence 1 1 1 1   Copy design 0  0 0 1  Total score 22 23 25 30       02/29/2020   11:00 AM  Montreal Cognitive Assessment   Visuospatial/ Executive (0/5) 1  Naming (0/3) 3  Attention: Read list of digits (0/2) 1  Attention: Read list of letters (0/1) 1  Attention: Serial 7 subtraction starting at 100 (0/3) 1  Language: Repeat phrase (0/2) 1  Language : Fluency (0/1) 0  Abstraction (0/2) 1  Delayed Recall (0/5) 0  Orientation (0/6) 4  Total 13  Adjusted Score (based on education) 14      12/25/2022    9:03 AM 12/20/2021   10:04 AM  6CIT Screen  What Year? 0 points 0 points  What month? 0 points 0 points  What time? 0 points 0 points  Count back from 20 4 points 0 points  Months in reverse 4 points 0 points  Repeat phrase 10 points 0 points  Total Score 18 points 0 points    Immunizations Immunization History  Administered Date(s) Administered   Fluad Quad(high Dose 65+) 11/13/2018, 11/11/2019, 11/16/2020, 10/27/2021   Influenza Split 11/17/2010, 11/29/2011   Influenza Whole 11/12/2007, 11/17/2008, 11/09/2009   Influenza, High Dose Seasonal PF 11/15/2014, 11/10/2015, 11/23/2016, 11/13/2017   Influenza,inj,Quad PF,6+ Mos 10/30/2012, 11/13/2013, 02/01/2015   Influenza-Unspecified 11/16/2020, 11/10/2022   Moderna Sars-Covid-2 Vaccination 02/27/2019, 03/30/2019, 12/07/2019   Pneumococcal Conjugate-13 12/15/2013   Pneumococcal Polysaccharide-23 09/04/2005, 01/21/2012, 05/03/2020   Td 01/18/1999, 10/13/2008   Tdap 01/21/2012   Zoster Recombinant(Shingrix) 02/06/2017, 05/07/2017   Zoster, Live 11/27/2006    TDAP status: Due, Education has been provided regarding the importance of this vaccine. Advised may receive this vaccine at local pharmacy or Health Dept. Aware to provide a copy of the vaccination record if obtained from local pharmacy or Health Dept. Verbalized acceptance and understanding.  Flu Vaccine status: Up to date  Pneumococcal vaccine status: Up to date  Covid-19 vaccine status:  Declined, Education has been provided regarding the importance of this vaccine but patient still declined. Advised may receive this vaccine at local pharmacy or Health Dept.or vaccine clinic. Aware to provide a copy of the vaccination record if obtained from local pharmacy or Health Dept. Verbalized acceptance and understanding.  Qualifies for Shingles Vaccine? Yes   Zostavax completed Yes   Shingrix Completed?: Yes  Screening Tests Health Maintenance  Topic Date Due   DTaP/Tdap/Td (4 - Td or Tdap) 01/20/2022   COVID-19 Vaccine (4 - 2023-24 season) 10/07/2022   Medicare Annual Wellness (AWV)  12/25/2023   Pneumonia Vaccine 31+ Years old  Completed   INFLUENZA VACCINE  Completed   Zoster Vaccines- Shingrix  Completed   HPV VACCINES  Aged Out    Health Maintenance  Health Maintenance Due  Topic  Date Due   DTaP/Tdap/Td (4 - Td or Tdap) 01/20/2022   COVID-19 Vaccine (4 - 2023-24 season) 10/07/2022        Additional Screening:    Vision Screening: Recommended annual ophthalmology exams for early detection of glaucoma and other disorders of the eye. Is the patient up to date with their annual eye exam?  Yes  Who is the provider or what is the name of the office in which the patient attends annual eye exams? Dr Hazle Quant If pt is not established with a provider, would they like to be referred to a provider to establish care? No .   Dental Screening: Recommended annual dental exams for proper oral hygiene    Community Resource Referral / Chronic Care Management:  CRR required this visit?  No   CCM required this visit?  No     Plan:     I have personally reviewed and noted the following in the patient's chart:   Medical and social history Use of alcohol, tobacco or illicit drugs  Current medications and supplements including opioid prescriptions. Patient is not currently taking opioid prescriptions. Functional ability and status Nutritional status Physical  activity Advanced directives List of other physicians Hospitalizations, surgeries, and ER visits in previous 12 months Vitals Screenings to include cognitive, depression, and falls Referrals and appointments  In addition, I have reviewed and discussed with patient certain preventive protocols, quality metrics, and best practice recommendations. A written personalized care plan for preventive services as well as general preventive health recommendations were provided to patient.     Tillie Rung, LPN   [id number]   After Visit Summary: (MyChart) Due to this being a telephonic visit, the after visit summary with patients personalized plan was offered to patient via MyChart   Nurse Notes: None

## 2022-12-25 NOTE — Patient Instructions (Addendum)
Mr. Robbi Gonzalo , Thank you for taking time to come for your Medicare Wellness Visit. I appreciate your ongoing commitment to your health goals. Please review the following plan we discussed and let me know if I can assist you in the future.   Referrals/Orders/Follow-Ups/Clinician Recommendations:   This is a list of the screening recommended for you and due dates:  Health Maintenance  Topic Date Due   DTaP/Tdap/Td vaccine (4 - Td or Tdap) 01/20/2022   COVID-19 Vaccine (4 - 2023-24 season) 10/07/2022   Medicare Annual Wellness Visit  12/25/2023   Pneumonia Vaccine  Completed   Flu Shot  Completed   Zoster (Shingles) Vaccine  Completed   HPV Vaccine  Aged Out    Advanced directives: (Copy Requested) Please bring a copy of your health care power of attorney and living will to the office to be added to your chart at your convenience.  Next Medicare Annual Wellness Visit scheduled for next year: Yes

## 2023-01-17 ENCOUNTER — Other Ambulatory Visit: Payer: Self-pay | Admitting: Family Medicine

## 2023-01-17 ENCOUNTER — Ambulatory Visit: Payer: Medicare HMO | Admitting: Family Medicine

## 2023-01-17 ENCOUNTER — Encounter: Payer: Self-pay | Admitting: Family Medicine

## 2023-01-17 VITALS — BP 116/82 | HR 57 | Temp 97.8°F | Resp 16 | Ht 66.0 in | Wt 170.8 lb

## 2023-01-17 DIAGNOSIS — N400 Enlarged prostate without lower urinary tract symptoms: Secondary | ICD-10-CM

## 2023-01-17 DIAGNOSIS — E559 Vitamin D deficiency, unspecified: Secondary | ICD-10-CM | POA: Diagnosis not present

## 2023-01-17 DIAGNOSIS — R110 Nausea: Secondary | ICD-10-CM | POA: Diagnosis not present

## 2023-01-17 DIAGNOSIS — E785 Hyperlipidemia, unspecified: Secondary | ICD-10-CM | POA: Diagnosis not present

## 2023-01-17 DIAGNOSIS — Z0000 Encounter for general adult medical examination without abnormal findings: Secondary | ICD-10-CM

## 2023-01-17 DIAGNOSIS — Z86718 Personal history of other venous thrombosis and embolism: Secondary | ICD-10-CM

## 2023-01-17 DIAGNOSIS — F418 Other specified anxiety disorders: Secondary | ICD-10-CM

## 2023-01-17 LAB — CBC WITH DIFFERENTIAL/PLATELET
Basophils Absolute: 0 10*3/uL (ref 0.0–0.1)
Basophils Relative: 0.6 % (ref 0.0–3.0)
Eosinophils Absolute: 0.1 10*3/uL (ref 0.0–0.7)
Eosinophils Relative: 3.3 % (ref 0.0–5.0)
HCT: 48.7 % (ref 39.0–52.0)
Hemoglobin: 17 g/dL (ref 13.0–17.0)
Lymphocytes Relative: 31.7 % (ref 12.0–46.0)
Lymphs Abs: 1.2 10*3/uL (ref 0.7–4.0)
MCHC: 34.8 g/dL (ref 30.0–36.0)
MCV: 101.2 fL — ABNORMAL HIGH (ref 78.0–100.0)
Monocytes Absolute: 0.4 10*3/uL (ref 0.1–1.0)
Monocytes Relative: 10.7 % (ref 3.0–12.0)
Neutro Abs: 2 10*3/uL (ref 1.4–7.7)
Neutrophils Relative %: 53.7 % (ref 43.0–77.0)
Platelets: 149 10*3/uL — ABNORMAL LOW (ref 150.0–400.0)
RBC: 4.82 Mil/uL (ref 4.22–5.81)
RDW: 12.7 % (ref 11.5–15.5)
WBC: 3.8 10*3/uL — ABNORMAL LOW (ref 4.0–10.5)

## 2023-01-17 LAB — LIPID PANEL
Cholesterol: 163 mg/dL (ref 0–200)
HDL: 40.2 mg/dL (ref >39.00)
LDL Cholesterol: 93 mg/dL (ref 0–99)
NonHDL: 122.88
Total CHOL/HDL Ratio: 4
Triglycerides: 149 mg/dL (ref 0.0–149.0)
VLDL: 29.8 mg/dL (ref 0.0–40.0)

## 2023-01-17 LAB — COMPREHENSIVE METABOLIC PANEL
ALT: 17 U/L (ref 0–53)
AST: 21 U/L (ref 0–37)
Albumin: 4.3 g/dL (ref 3.5–5.2)
Alkaline Phosphatase: 61 U/L (ref 39–117)
BUN: 17 mg/dL (ref 6–23)
CO2: 33 meq/L — ABNORMAL HIGH (ref 19–32)
Calcium: 9.4 mg/dL (ref 8.4–10.5)
Chloride: 101 meq/L (ref 96–112)
Creatinine, Ser: 1.02 mg/dL (ref 0.40–1.50)
GFR: 66.79 mL/min (ref >60.00)
Glucose, Bld: 94 mg/dL (ref 70–99)
Potassium: 4.2 meq/L (ref 3.5–5.1)
Sodium: 142 meq/L (ref 135–145)
Total Bilirubin: 1.5 mg/dL — ABNORMAL HIGH (ref 0.2–1.2)
Total Protein: 6.9 g/dL (ref 6.0–8.3)

## 2023-01-17 LAB — PSA: PSA: 3.61 ng/mL (ref 0.10–4.00)

## 2023-01-17 LAB — TSH: TSH: 1.7 u[IU]/mL (ref 0.35–5.50)

## 2023-01-17 LAB — VITAMIN D 25 HYDROXY (VIT D DEFICIENCY, FRACTURES): VITD: 93.94 ng/mL (ref 30.00–100.00)

## 2023-01-17 MED ORDER — SERTRALINE HCL 50 MG PO TABS: 50.0000 mg | ORAL_TABLET | Freq: Every day | ORAL | 3 refills | Status: DC

## 2023-01-17 MED ORDER — PANTOPRAZOLE SODIUM 40 MG PO TBEC
40.0000 mg | DELAYED_RELEASE_TABLET | Freq: Every day | ORAL | 3 refills | Status: DC
Start: 2023-01-17 — End: 2023-10-21

## 2023-01-17 MED ORDER — EZETIMIBE 10 MG PO TABS
10.0000 mg | ORAL_TABLET | Freq: Every day | ORAL | 3 refills | Status: DC
Start: 2023-01-17 — End: 2024-01-04

## 2023-01-17 NOTE — Assessment & Plan Note (Signed)
.  ghmutd Check labs See AVS Health Maintenance  Topic Date Due   COVID-19 Vaccine (4 - 2024-25 season) 10/07/2022   Medicare Annual Wellness (AWV)  12/25/2023   DTaP/Tdap/Td (5 - Td or Tdap) 03/22/2032   Pneumonia Vaccine 61+ Years old  Completed   INFLUENZA VACCINE  Completed   Zoster Vaccines- Shingrix  Completed   HPV VACCINES  Aged Out

## 2023-01-17 NOTE — Progress Notes (Signed)
Established Patient Office Visit  Subjective   Patient ID: Shawn [name], male    DOB: 1935-01-27  Age: 88 y.o. MRN: [id number]  Chief Complaint  Patient presents with   Annual Exam    HPI Discussed the use of AI scribe software for clinical note transcription with the patient, who gave verbal consent to proceed.  History of Present Illness   The patient, with a history of hypertension and on anticoagulation therapy, presents for a routine physical. He reports no changes in his health status since the last visit. The patient's medication regimen includes Sertraline, which was reportedly increased from 50mg  to 100mg  in February. The patient and his caregiver were unsure about the reason for the increase and have been continuing with the 50mg  dose.  The patient also reports a long-standing issue with a watering eye, which he believes is due to a clogged duct. He has been managing the symptoms by frequently wiping his eyes. The patient has an upcoming appointment with an ophthalmologist to address this issue.  The patient's caregiver notes a possible decline in the patient's memory over the past year, although the patient perceives it as stable. He is currently under the care of a neurologist for this issue.  The patient denies any changes in family history, joint issues, or stomach problems. He is due for blood work and has received his flu shot in October. The patient's anticoagulation medication, Eliquis, is obtained through the [place].      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   THROMBOCYTOPENIA 12/01/2009   PULMONARY EMBOLISM, HX OF 12/01/2009   Pulmonary embolism (HCC) 11/20/2009   CERUMEN IMPACTION, RIGHT 07/29/2009   VARICOSE VEINS LOWER EXTREMITIES W/INFLAMMATION 12/01/2008   Acute thromboembolism of deep veins of lower extremity (HCC) 09/03/2008   Aortic valve disorder 11/19/2007   HEPATIC CYST 11/19/2007   CALF PAIN, LEFT 11/11/2007   Memory loss 08/28/2007   Benign neoplasm of skin 08/11/2007   Hyperlipidemia 09/04/2006   Osteoarthritis 09/04/2006   Dysphagia 09/04/2006   Past Medical History:  Diagnosis Date   Allergy    Cardiac murmur    aortic sclerosis; moderate AS 08/11/21   Cataract    left eye   Clotting disorder (HCC)    Factor v Leiden factor mutation   Colon polyps    Hyperplastic   Diverticulosis    Dysphagia    Factor V Leiden mutation (HCC)    Hemorrhoids    History of blood clots    Hyperlipidemia    Osteoarthritis    Pulmonary embolism (HCC) 11/20/2009   Past Surgical History:  Procedure Laterality Date   APPENDECTOMY     CATARACT EXTRACTION  02/09/2007   right eye   INGUINAL HERNIA REPAIR     INGUINAL HERNIA REPAIR Right 01/05/2022   Procedure: RIGHT HERNIA REPAIR RECURRENT INGUINAL WITH THREE BY SIX POLYPROPYLENE MESH;  Surgeon: Violeta Gelinas, MD;  Location: [facility] OR;  Service: General;  Laterality: Right;  GEN AND TAP BLOCK   TONSILLECTOMY     VARICOSE VEIN SURGERY     VASECTOMY     Social History   Tobacco Use   Smoking status: Never   Smokeless tobacco: Never  Vaping Use   Vaping status: Never Used  Substance Use Topics   Alcohol use: No    Alcohol/week: 0.0 standard drinks of alcohol   Drug use: No   Social History   Socioeconomic History   Marital status: Married    Spouse name: Not on file   Number of  children: 4   Years of education: Not on file   Highest education level: Not on file  Occupational History   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    Right handed   One story home   Social Drivers of Health   Financial Resource Strain: Low Risk  (12/25/2022)   Overall Financial Resource Strain (CARDIA)    Difficulty of Paying Living Expenses: Not hard at all  Food Insecurity: No Food Insecurity (12/25/2022)   Hunger Vital Sign    Worried About Running Out of Food in the Last Year: Never true    Ran Out of Food in the Last Year: Never true  Transportation Needs: No Transportation Needs (12/25/2022)   PRAPARE - Administrator, Civil Service (Medical): No    Lack of Transportation (Non-Medical): No  Physical Activity: Insufficiently Active (12/25/2022)   Exercise Vital Sign    Days of Exercise per Week: 5 days    Minutes of Exercise per Session: 20 min  Stress: No Stress Concern Present (12/25/2022)   [employer] of Occupational Health - Occupational Stress Questionnaire    Feeling of Stress : Not at all  Social Connections: Moderately Isolated (12/25/2022)   Social Connection and Isolation Panel [NHANES]    Frequency of Communication with Friends and Family: More than three times a week    Frequency of Social Gatherings with Friends and Family: More than three times a week    Attends Religious Services: Never    Database administrator or Organizations: No     Attends Banker Meetings: Never    Marital Status: Married  Catering manager Violence: Not At Risk (12/25/2022)   Humiliation, Afraid, Rape, and Kick questionnaire    Fear of Current or Ex-Partner: No    Emotionally Abused: No    Physically Abused: No    Sexually Abused: No   Family Status  Relation Name Status   Father  Deceased at age 64       lung ca   Mother  Deceased at age 24   Neg Hx  (Not Specified)  No partnership data on file   Family History  Problem Relation Age of Onset  Lung cancer Father    Heart attack Mother 20   Heart disease Mother        chf   Arthritis Mother    Colon cancer Neg Hx    Esophageal cancer Neg Hx    Rectal cancer Neg Hx    Stomach cancer Neg Hx    Allergies  Allergen Reactions   Crestor [Rosuvastatin] Other (See Comments)    disorientation   Niacin Rash    "My whole body turned red on the prescription dose" He takes the OTC medication   Pravastatin Other (See Comments)    Disorientation      Review of Systems  Constitutional:  Negative for fever and malaise/fatigue.  HENT:  Negative for congestion.   Eyes:  Negative for blurred vision.  Respiratory:  Negative for cough and shortness of breath.   Cardiovascular:  Negative for chest pain, palpitations and leg swelling.  Gastrointestinal:  Negative for abdominal pain, blood in stool, nausea and vomiting.  Genitourinary:  Negative for dysuria and frequency.  Musculoskeletal:  Negative for back pain and falls.  Skin:  Negative for rash.  Neurological:  Negative for dizziness, loss of consciousness and headaches.  Endo/Heme/Allergies:  Negative for environmental allergies.  Psychiatric/Behavioral:  Positive for memory loss. Negative for depression. The patient is not nervous/anxious.       Objective:     BP 116/82 (BP Location: Left Arm, Patient Position: Sitting)   Pulse (!) 57   Temp 97.8 F (36.6 C) (Oral)   Resp 16   Ht 5\' 6"  (1.676 m)   Wt 170 lb 12.8 oz  (77.5 kg)   SpO2 94%   BMI 27.57 kg/m  BP Readings from Last 3 Encounters:  01/17/23 116/82  08/16/22 121/72  04/20/22 126/76   Wt Readings from Last 3 Encounters:  01/17/23 170 lb 12.8 oz (77.5 kg)  12/25/22 166 lb (75.3 kg)  08/16/22 166 lb (75.3 kg)   SpO2 Readings from Last 3 Encounters:  01/17/23 94%  08/16/22 95%  04/20/22 93%      Physical Exam Vitals and nursing note reviewed.  Constitutional:      General: He is not in acute distress.    Appearance: Normal appearance. He is well-developed.  HENT:     Head: Normocephalic and atraumatic.     Right Ear: Tympanic membrane, ear canal and external ear normal. There is no impacted cerumen.     Left Ear: Tympanic membrane, ear canal and external ear normal. There is no impacted cerumen.     Nose: Nose normal.     Mouth/Throat:     Mouth: Mucous membranes are moist.     Pharynx: Oropharynx is clear. No oropharyngeal exudate or posterior oropharyngeal erythema.  Eyes:     General: No scleral icterus.       Right eye: No discharge.        Left eye: No discharge.     Conjunctiva/sclera: Conjunctivae normal.     Pupils: Pupils are equal, round, and reactive to light.  Neck:     Thyroid: No thyromegaly.     Vascular: No JVD.  Cardiovascular:     Rate and Rhythm: Normal rate and regular rhythm.     Heart sounds: Normal heart sounds. No murmur heard. Pulmonary:     Effort: Pulmonary effort is normal. No respiratory distress.     Breath sounds: Normal breath sounds.  Abdominal:     General: Bowel sounds are normal. There is no distension.  Palpations: Abdomen is soft. There is no mass.     Tenderness: There is no abdominal tenderness. There is no guarding or rebound.  Musculoskeletal:        General: Normal range of motion.     Cervical back: Normal range of motion and neck supple.     Right lower leg: No edema.     Left lower leg: No edema.  Lymphadenopathy:     Cervical: No cervical adenopathy.  Skin:     General: Skin is warm and dry.     Findings: No erythema or rash.  Neurological:     Mental Status: He is alert and oriented to person, place, and time.     Cranial Nerves: No cranial nerve deficit.     Motor: No abnormal muscle tone.     Deep Tendon Reflexes: Reflexes are normal and symmetric. Reflexes normal.  Psychiatric:        Mood and Affect: Mood normal.        Behavior: Behavior normal.        Thought Content: Thought content normal.        Judgment: Judgment normal.      No results found for any visits on 01/17/23.  Last CBC Lab Results  Component Value Date   WBC 5.1 03/12/2022   HGB 15.7 03/12/2022   HCT 44.7 03/12/2022   MCV 98.8 03/12/2022   MCH 34.5 (H) 12/21/2021   RDW 12.8 03/12/2022   PLT 159.0 03/12/2022   Last metabolic panel Lab Results  Component Value Date   GLUCOSE 87 03/12/2022   NA 141 03/12/2022   K 4.5 03/12/2022   CL 99 03/12/2022   CO2 31 03/12/2022   BUN 21 03/12/2022   CREATININE 1.06 03/12/2022   GFR 64.16 03/12/2022   CALCIUM 9.7 03/12/2022   PROT 7.2 03/12/2022   ALBUMIN 4.4 03/12/2022   BILITOT 0.9 03/12/2022   ALKPHOS 63 03/12/2022   AST 24 03/12/2022   ALT 25 03/12/2022   ANIONGAP 5 12/21/2021   Last lipids Lab Results  Component Value Date   CHOL 164 01/15/2022   HDL 42.40 01/15/2022   LDLCALC 87 01/15/2022   LDLDIRECT 86.0 06/17/2014   TRIG 176.0 (H) 01/15/2022   CHOLHDL 4 01/15/2022   Last hemoglobin A1c Lab Results  Component Value Date   HGBA1C 4.8 08/11/2021   Last thyroid functions Lab Results  Component Value Date   TSH 1.59 01/15/2022   Last vitamin D Lab Results  Component Value Date   VD25OH 103.84 (HH) 01/15/2022   Last vitamin B12 and Folate Lab Results  Component Value Date   VITAMINB12 1,170 (H) 01/15/2022   FOLATE > 20.0 ng/mL 08/11/2007      The ASCVD Risk score (Arnett DK, et al., 2019) failed to calculate for the following reasons:   The 2019 ASCVD risk score is only valid for  ages 73 to 22   Risk score cannot be calculated because patient has a medical history suggesting prior/existing ASCVD    Assessment & Plan:   Problem List Items Addressed This Visit       Unprioritized   Hyperlipidemia   Relevant Medications   ezetimibe (ZETIA) 10 MG tablet   Other Relevant Orders   Lipid panel   Comprehensive metabolic panel   CBC with Differential/Platelet   VITAMIN D 25 Hydroxy (Vit-D Deficiency, Fractures)   Depression with anxiety   Relevant Medications   sertraline (ZOLOFT) 50 MG tablet   Other Relevant Orders  TSH   CBC with Differential/Platelet   VITAMIN D 25 Hydroxy (Vit-D Deficiency, Fractures)   PULMONARY EMBOLISM, HX OF   On eliquis  stable      Preventative health care - Primary   .ghmutd Check labs See AVS Health Maintenance  Topic Date Due   COVID-19 Vaccine (4 - 2024-25 season) 10/07/2022   Medicare Annual Wellness (AWV)  12/25/2023   DTaP/Tdap/Td (5 - Td or Tdap) 03/22/2032   Pneumonia Vaccine 58+ Years old  Completed   INFLUENZA VACCINE  Completed   Zoster Vaccines- Shingrix  Completed   HPV VACCINES  Aged Out         Other Visit Diagnoses       Nausea       Relevant Medications   pantoprazole (PROTONIX) 40 MG tablet     Enlarged prostate       Relevant Orders   PSA     Vitamin D deficiency       Relevant Orders   VITAMIN D 25 Hydroxy (Vit-D Deficiency, Fractures)     Assessment and Plan    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 We discussed the potential for an ophthalmologic procedure using a sterile wire to unclog the duct, which might not necessitate hospital admission or anesthesia. He will be referred to an ophthalmologist for evaluation and potential treatment.  Medication Management Another provider increased his Sertraline dosage to 100 mg in February, but he prefers to revert to 50 mg. After discussion,  we agreed to change the Sertraline dosage back to 50 mg and renew other medications as needed.  General Health Maintenance During his routine physical examination, there were no changes in family history, but a slight memory decline was noted compared to last year. His flu shot was administered in October, and his pneumonia vaccination is up to date. We will order blood work and continue follow-up with a neurologist.  Follow-up We will schedule a follow-up appointment as needed and ensure the completion of blood work.        Return in about 6 months (around 07/18/2023).    Donato Schultz, DO

## 2023-01-17 NOTE — Patient Instructions (Signed)
Preventive Care 65 Years and Older, Male Preventive care refers to lifestyle choices and visits with your health care provider that can promote health and wellness. Preventive care visits are also called wellness exams. What can I expect for my preventive care visit? Counseling During your preventive care visit, your health care provider may ask about your: Medical history, including: Past medical problems. Family medical history. History of falls. Current health, including: Emotional well-being. Home life and relationship well-being. Sexual activity. Memory and ability to understand (cognition). Lifestyle, including: Alcohol, nicotine or tobacco, and drug use. Access to firearms. Diet, exercise, and sleep habits. Work and work environment. Sunscreen use. Safety issues such as seatbelt and bike helmet use. Physical exam Your health care provider will check your: Height and weight. These may be used to calculate your BMI (body mass index). BMI is a measurement that tells if you are at a healthy weight. Waist circumference. This measures the distance around your waistline. This measurement also tells if you are at a healthy weight and may help predict your risk of certain diseases, such as type 2 diabetes and high blood pressure. Heart rate and blood pressure. Body temperature. Skin for abnormal spots. What immunizations do I need?  Vaccines are usually given at various ages, according to a schedule. Your health care provider will recommend vaccines for you based on your age, medical history, and lifestyle or other factors, such as travel or where you work. What tests do I need? Screening Your health care provider may recommend screening tests for certain conditions. This may include: Lipid and cholesterol levels. Diabetes screening. This is done by checking your blood sugar (glucose) after you have not eaten for a while (fasting). Hepatitis C test. Hepatitis B test. HIV (human  immunodeficiency virus) test. STI (sexually transmitted infection) testing, if you are at risk. Lung cancer screening. Colorectal cancer screening. Prostate cancer screening. Abdominal aortic aneurysm (AAA) screening. You may need this if you are a current or former smoker. Talk with your health care provider about your test results, treatment options, and if necessary, the need for more tests. Follow these instructions at home: Eating and drinking  Eat a diet that includes fresh fruits and vegetables, whole grains, lean protein, and low-fat dairy products. Limit your intake of foods with high amounts of sugar, saturated fats, and salt. Take vitamin and mineral supplements as recommended by your health care provider. Do not drink alcohol if your health care provider tells you not to drink. If you drink alcohol: Limit how much you have to 0-2 drinks a day. Know how much alcohol is in your drink. In the [place] one drink equals one 12 oz bottle of beer (355 mL), one 5 oz glass of wine (148 mL), or one 1 oz glass of hard liquor (44 mL). Lifestyle Brush your teeth every morning and night with fluoride toothpaste. Floss one time each day. Exercise for at least 30 minutes 5 or more days each week. Do not use any products that contain nicotine or tobacco. These products include cigarettes, chewing tobacco, and vaping devices, such as e-cigarettes. If you need help quitting, ask your health care provider. Do not use drugs. If you are sexually active, practice safe sex. Use a condom or other form of protection to prevent STIs. Take aspirin only as told by your health care provider. Make sure that you understand how much to take and what form to take. Work with your health care provider to find out whether it is safe   and beneficial for you to take aspirin daily. Ask your health care provider if you need to take a cholesterol-lowering medicine (statin). Find healthy ways to manage stress, such  as: Meditation, yoga, or listening to music. Journaling. Talking to a trusted person. Spending time with friends and family. Safety Always wear your seat belt while driving or riding in a vehicle. Do not drive: If you have been drinking alcohol. Do not ride with someone who has been drinking. When you are tired or distracted. While texting. If you have been using any mind-altering substances or drugs. Wear a helmet and other protective equipment during sports activities. If you have firearms in your house, make sure you follow all gun safety procedures. Minimize exposure to UV radiation to reduce your risk of skin cancer. What's next? Visit your health care provider once a year for an annual wellness visit. Ask your health care provider how often you should have your eyes and teeth checked. Stay up to date on all vaccines. This information is not intended to replace advice given to you by your health care provider. Make sure you discuss any questions you have with your health care provider. Document Revised: 07/20/2020 Document Reviewed: 07/20/2020 Elsevier Patient Education  2024 [employer]  

## 2023-01-17 NOTE — Assessment & Plan Note (Signed)
On eliquis - stable

## 2023-02-19 ENCOUNTER — Ambulatory Visit (INDEPENDENT_AMBULATORY_CARE_PROVIDER_SITE_OTHER): Payer: Medicare HMO | Admitting: Physician Assistant

## 2023-02-19 ENCOUNTER — Encounter: Payer: Self-pay | Admitting: Physician Assistant

## 2023-02-19 VITALS — BP 118/67 | HR 65 | Resp 18 | Ht 66.0 in | Wt 173.0 lb

## 2023-02-19 DIAGNOSIS — F03918 Unspecified dementia, unspecified severity, with other behavioral disturbance: Secondary | ICD-10-CM | POA: Diagnosis not present

## 2023-02-19 MED ORDER — MEMANTINE HCL 5 MG PO TABS: ORAL_TABLET | ORAL | 3 refills | Status: DC

## 2023-02-19 NOTE — Progress Notes (Signed)
 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 RH male with a history of hyperlipidemia, PE and DVT, Factor V Leiden deficiency on Eliquis , and a prior diagnosis of mild dementia (although the time in January 2022 did not meet clearly the full criteria, with atypical Alzheimer's as a differential with a vascular contribution) seen today in follow up for memory loss. Patient is currently on donepezil  10 mg daily.  Mild cognitive decline is noted, MMSE today is 21/30.  Discussed adding memantine  to the regimen at a low dose, he is interested in proceeding.  Mood is depressed, has not sought psychotherapy to date, he continues to be on Zoloft , which has been recently increased by PCP he is able to perform on his ADLs to his ability, no longer drives.  Follow up in 6 months. Continue donepezil  10 mg daily, side effects discussed Start memantine  5 mg twice daily as directed, side effects discussed Patient scheduled for repeat neuropsych evaluation in the near future, for clarity of the diagnosis and disease trajectory Recommend good control of her cardiovascular risk factors Follow-up with hematology F V Leiden (history of PE-DVT-TIA) Continue to control mood as per PCP, he is on Zoloft      Subjective:    This patient is accompanied in the office by his wife who supplements the history.  Previous records as well as any outside records available were reviewed prior to todays visit. Patient was last seen on July 2024 with MMSE 22/30   Any changes in memory since last visit? Memory is worse-wife says.  He continues to have difficulty remembering recent conversations and names.  Enjoys jigsaw puzzles but does smaller ones because the other ones are more difficult to him, does not like reading. repeats oneself?  Endorsed, not a lot  Disoriented when walking into a room?  Patient denies   Leaving objects?   May misplace things but not in unusual places   Wandering behavior?  denies   Any personality changes since last visit?  He has a history of situational depression attributed by him to inability to drive and friends dying.  He has declined psychotherapy in the past. Taking antidepressant and it is helping Any worsening depression?:  Denies.   Hallucinations or paranoia?  Denies.  He has episodes of sundowning but not frequently  Seizures? denies    Any sleep changes?  He reports vivid dreams, but does talk in his sleep according to his wife.  She reports that he may reenactment dreams, reaching something that is not there. Sleep apnea?   Denies.   Any hygiene concerns? Denies.  Independent of bathing and dressing?  Endorsed  Does the patient needs help with medications?   Wife is in charge   Who is in charge of the finances?  Wife is in charge although he participates.  Any changes in appetite?  Decreased appetite, he favors ice cream.    Patient have trouble swallowing? Denies.   Does the patient cook? No Any headaches?   denies   Chronic back pain  denies   Ambulates with difficulty? Denies.  Wife reports that he is slower than before.  Recent falls or head injuries? Denies.     Unilateral weakness, numbness or tingling? denies   Any tremors?  As prior, she has known minimal left hand tremor.  No other parkinsonian signs are reported Any anosmia?  Denies   Any incontinence of urine?  Endorsed, wears diapers Any bowel dysfunction?   Denies      Patient lives with his wife  Does the patient drive? No longer drives      History on Initial Assessment 04/22/2020: This is an 89 year old right-handed man with a history of hyperlipidemia, PE and DVT, Factor V Leiden deficiency, presenting for evaluation of dementia. His wife is present to provide additional information. Records were reviewed, he underwent Neuropsychological evaluation in January 2022 with very significant cognitive impairment  with profound visuospatial problems and additional low scores on measures of processing speed, executive function, and working memory. 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 Atypical Alzheimer's is also a possibility. Vascular disease may be contributing but does not well explain his higher cortical signs. MRI brain in 2017 showed moderate chronic microvascular disease and mild diffuse volume loss. Repeat MRI brain in 2022 showed progression of chronic microvascular disease, no acute changes.    He feels his memory is good, not perfect. His wife started noticing forgetfulness and word-finding difficulties a couple of years ago. His handwriting has always been terrible, a little more illegible than before. He continues to drive and denies getting lost driving. He manages his own medications and denies missing doses. They do bills together. His wife denies any difficulties following instructions or using the remote control/microwave. He is independent with dressing and bathing. No personality changes, mood is same as always, no paranoia or hallucinations.   They started noticing involuntary hand movements a few months ago. He has intermittent right arm myoclonus throughout the visit. They note he moves his fingers without realizing. Movements do not affect writing or using utensils. He has not noticed them in his legs. No falls. Sleep is good, no REM behavior disorder. He does fall asleep when he reads during the day. No staring/unresponsive episodes, gaps in time, olfactory/gustatory hallucinations. No change in gait, he denies any stiffness/difficulty with getting out of bed.  PREVIOUS  MEDICATIONS:   CURRENT MEDICATIONS:  Outpatient Encounter Medications as of 02/19/2023  Medication Sig   ascorbic acid (VITAMIN C) 500 MG tablet Take 500 mg by mouth daily with lunch.   Cholecalciferol 125 MCG (5000 UT) TABS Take 5,000 Units by mouth every Monday, Tuesday, Wednesday, Thursday, and Friday.  With lunch   cyanocobalamin  (VITAMIN B12) 1000 MCG tablet Take 1,000 mcg by mouth daily.   donepezil  (ARICEPT ) 10 MG tablet TAKE 1 TABLET BY MOUTH EVERY DAY   ELIQUIS  5 MG TABS tablet TAKE 1 TABLET BY MOUTH TWICE A DAY   ezetimibe  (ZETIA ) 10 MG tablet Take 1 tablet (10 mg total) by mouth daily.   memantine  (NAMENDA ) 5 MG tablet Take 1 tablet (5 mg at night) for 2 weeks, then increase to 1 tablet (5 mg) twice a day   Multiple Vitamin (MULTIVITAMIN WITH MINERALS) TABS tablet Take 1 tablet by mouth daily with lunch.   pantoprazole  (PROTONIX ) 40 MG tablet Take 1 tablet (40 mg total) by mouth daily.   saw palmetto  160 MG capsule Take 320 mg by mouth daily with lunch.   sertraline  (ZOLOFT ) 50 MG tablet Take 1 tablet (50 mg total) by mouth daily.   No facility-administered encounter medications on file as of 02/19/2023.       02/19/2023  12:00 PM 08/16/2022    1:00 PM 02/22/2022   12:00 PM  MMSE - Mini Mental State Exam  Orientation to time 3 1 1   Orientation to Place 3 3 5   Registration 3 3 3   Attention/ Calculation 4 5 5   Recall 1 2 1   Language- name 2 objects 2 2 2   Language- repeat 1 1 1   Language- follow 3 step command 3 3 3   Language- read & follow direction 1 1 1   Write a sentence 1 1 1   Copy design 0 0 0  Total score 22 22 23       02/29/2020   11:00 AM  Montreal Cognitive Assessment   Visuospatial/ Executive (0/5) 1  Naming (0/3) 3  Attention: Read list of digits (0/2) 1  Attention: Read list of letters (0/1) 1  Attention: Serial 7 subtraction starting at 100 (0/3) 1  Language: Repeat phrase (0/2) 1  Language : Fluency (0/1) 0  Abstraction (0/2) 1  Delayed Recall  (0/5) 0  Orientation (0/6) 4  Total 13  Adjusted Score (based on education) 14    Objective:     PHYSICAL EXAMINATION:    VITALS:   Vitals:   02/19/23 1045  BP: 118/67  Pulse: 65  Resp: 18  SpO2: 95%  Weight: 173 lb (78.5 kg)  Height: 5' 6 (1.676 m)    GEN:  The patient appears stated age and is in NAD. HEENT:  Normocephalic, atraumatic.   Neurological examination:  General: NAD, well-groomed, appears stated age. Orientation: The patient is alert. Oriented to person, not to place or date Cranial nerves: There is good facial symmetry.The speech is fluent and clear. No aphasia or dysarthria. Fund of knowledge is appropriate. Recent and remote memory are impaired. Attention and concentration are reduced.  Able to name objects and repeat phrases.  Hearing is intact to conversational tone.   Sensation: Sensation is intact to light touch throughout Motor: Strength is at least antigravity x4. DTR's 2/4 in UE/LE     Movement examination: Tone: There is normal tone in the UE/LE Abnormal movements: Minimal chronic intention tremor bilaterally.  No myoclonus.  No asterixis.   Coordination:  There is no decremation with RAM's. Normal finger to nose  Gait and Station: The patient has no difficulty arising out of a deep-seated chair without the use of the hands. The patient's stride length is short, unchanged from prior.  His gait is cautious and narrow.    Thank you for allowing us  the opportunity to participate in the care of this nice patient. Please do not hesitate to contact us  for any questions or concerns.   Total time spent on today's visit was 31 minutes dedicated to this patient today, preparing to see patient, examining the patient, ordering tests and/or medications and counseling the patient, documenting clinical information in the EHR or other health record, independently interpreting results and communicating results to the patient/family, discussing treatment and goals,  answering patient's questions and coordinating care.  Cc:  Antonio Cyndee Jamee JONELLE ROSALEA Camie [facility] 02/19/2023 12:08 PM

## 2023-02-19 NOTE — Patient Instructions (Addendum)
 It was a pleasure to see you today at our office.   Recommendations:  Follow up in 6  months  Continue donepezil  10 mg daily.    Start Memantine  5 mg: Take 1 tablet at night  for 2 weeks, then increase to 1 tablet (5 mg) twice a day.    Repeat Neuropsych evaluation for clarity of diagnosis 04/2023      Whom to call:  Memory  decline, memory medications: Call our office [phone]   For psychiatric meds, mood meds: Please have your primary care physician manage these medications.   Counseling regarding caregiver distress, including caregiver depression, anxiety and issues regarding community resources, adult day care programs, adult living facilities, or memory care questions:   Feel free to contact Misty Waddell Simmer, Social Worker at [phone]   For assessment of decision of mental capacity and competency:  Call Dr. Rosaline Nine, geriatric psychiatrist at [phone]  For guidance in geriatric dementia issues please call [facility] [phone]  For guidance regarding [facility] and if placement were needed at the facility, contact Nat Hock, Social Worker tel: [phone]  Consider [facility]  [address], [place] [address] [phone]  Hours of Operation Mondays to Thursdays: 8 am to 8 pm,Fridays: 9 am to 8 pm, Saturdays: 9 am to 1 pm Sundays: Closed  [email or web address]  If you have any severe symptoms of a stroke, or other severe issues such as confusion,severe chills or fever, etc call 911 or go to the ER as you may need to be evaluated further   Feel free to visit Facebook page  [employer] for tips of how to care for people with memory problems.     RECOMMENDATIONS FOR ALL PATIENTS WITH MEMORY PROBLEMS: 1. Continue to exercise (Recommend 30 minutes of walking everyday, or 3 hours every week) 2. Increase social  interactions - continue going to [place] and enjoy social gatherings with friends and family 3. Eat healthy, avoid fried foods and eat more fruits and vegetables 4. Maintain adequate blood pressure, blood sugar, and blood cholesterol level. Reducing the risk of stroke and cardiovascular disease also helps promoting better memory. 5. Avoid stressful situations. Live a simple life and avoid aggravations. Organize your time and prepare for the next day in anticipation. 6. Sleep well, avoid any interruptions of sleep and avoid any distractions in the bedroom that may interfere with adequate sleep quality 7. Avoid sugar, avoid sweets as there is a strong link between excessive sugar intake, diabetes, and cognitive impairment We discussed the Mediterranean diet, which has been shown to help patients reduce the risk of progressive memory disorders and reduces cardiovascular risk. This includes eating fish, eat fruits and green leafy vegetables, nuts like almonds and hazelnuts, walnuts, and also use olive oil. Avoid fast foods and fried foods as much as possible. Avoid sweets and sugar as sugar use has been linked to worsening of memory function.  There is always a concern of gradual progression of memory problems. If this is the case, then we may need to adjust level of care according to patient needs. Support, both to the patient and caregiver, should then be put into place.    The Alzheimer's Association is here all day, every day for people facing Alzheimer's disease through our free 24/7 Helpline: [phone]. The Helpline provides reliable information and support to all those who need assistance, such as individuals living with memory loss, Alzheimer's or other dementia, caregivers, health care professionals and the  public.  Our highly trained and knowledgeable staff can help you with: Understanding memory loss, dementia and Alzheimer's  Medications and other treatment options  General information about  aging and brain health  Skills to provide quality care and to find the best care from professionals  Legal, financial and living-arrangement decisions Our Helpline also features: Confidential care consultation provided by master's level clinicians who can help with decision-making support, crisis assistance and education on issues families face every day  Help in a caller's preferred language using our translation service that features more than 200 languages and dialects  Referrals to local community programs, services and ongoing support     FALL PRECAUTIONS: Be cautious when walking. Scan the area for obstacles that may increase the risk of trips and falls. When getting up in the mornings, sit up at the edge of the bed for a few minutes before getting out of bed. Consider elevating the bed at the head end to avoid drop of blood pressure when getting up. Walk always in a well-lit room (use night lights in the walls). Avoid area rugs or power cords from appliances in the middle of the walkways. Use a walker or a cane if necessary and consider physical therapy for balance exercise. Get your eyesight checked regularly.  FINANCIAL OVERSIGHT: Supervision, especially oversight when making financial decisions or transactions is also recommended.  HOME SAFETY: Consider the safety of the kitchen when operating appliances like stoves, microwave oven, and blender. Consider having supervision and share cooking responsibilities until no longer able to participate in those. Accidents with firearms and other hazards in the house should be identified and addressed as well.   ABILITY TO BE LEFT ALONE: If patient is unable to contact 911 operator, consider using LifeLine, or when the need is there, arrange for someone to stay with patients. Smoking is a fire hazard, consider supervision or cessation. Risk of wandering should be assessed by caregiver and if detected at any point, supervision and safe proof  recommendations should be instituted.  MEDICATION SUPERVISION: Inability to self-administer medication needs to be constantly addressed. Implement a mechanism to ensure safe administration of the medications.   DRIVING: Regarding driving, in patients with progressive memory problems, driving will be impaired. We advise to have someone else do the driving if trouble finding directions or if minor accidents are reported. Independent driving assessment is available to determine safety of driving.   If you are interested in the driving assessment, you can contact the following:  [employer] in [place] [phone]  [facility] [phone]  [facility] [phone] or [phone]      Mediterranean Diet A Mediterranean diet refers to food and lifestyle choices that are based on the traditions of countries located on the [employer]. This way of eating has been shown to help prevent certain conditions and improve outcomes for people who have chronic diseases, like kidney disease and heart disease. What are tips for following this plan? Lifestyle  Cook and eat meals together with your family, when possible. Drink enough fluid to keep your urine clear or pale yellow. Be physically active every day. This includes: Aerobic exercise like running or swimming. Leisure activities like gardening, walking, or housework. Get 7-8 hours of sleep each night. If recommended by your health care provider, drink red wine in moderation. This means 1 glass a day for nonpregnant women and 2 glasses a day for men. A glass of wine equals 5 oz (  150 mL). Reading food labels  Check the serving size of packaged foods. For foods such as rice and pasta, the serving size refers to the amount of cooked product, not dry. Check the total fat in packaged foods. Avoid foods that have saturated fat or trans fats. Check the ingredients list  for added sugars, such as corn syrup. Shopping  At the grocery store, buy most of your food from the areas near the walls of the store. This includes: Fresh fruits and vegetables (produce). Grains, beans, nuts, and seeds. Some of these may be available in unpackaged forms or large amounts (in bulk). Fresh seafood. Poultry and eggs. Low-fat dairy products. Buy whole ingredients instead of prepackaged foods. Buy fresh fruits and vegetables in-season from local farmers markets. Buy frozen fruits and vegetables in resealable bags. If you do not have access to quality fresh seafood, buy precooked frozen shrimp or canned fish, such as tuna, salmon, or sardines. Buy small amounts of raw or cooked vegetables, salads, or olives from the deli or salad bar at your store. Stock your pantry so you always have certain foods on hand, such as olive oil, canned tuna, canned tomatoes, rice, pasta, and beans. Cooking  Cook foods with extra-virgin olive oil instead of using butter or other vegetable oils. Have meat as a side dish, and have vegetables or grains as your main dish. This means having meat in small portions or adding small amounts of meat to foods like pasta or stew. Use beans or vegetables instead of meat in common dishes like chili or lasagna. Experiment with different cooking methods. Try roasting or broiling vegetables instead of steaming or sauteing them. Add frozen vegetables to soups, stews, pasta, or rice. Add nuts or seeds for added healthy fat at each meal. You can add these to yogurt, salads, or vegetable dishes. Marinate fish or vegetables using olive oil, lemon juice, garlic, and fresh herbs. Meal planning  Plan to eat 1 vegetarian meal one day each week. Try to work up to 2 vegetarian meals, if possible. Eat seafood 2 or more times a week. Have healthy snacks readily available, such as: Vegetable sticks with hummus. Greek yogurt. Fruit and nut trail mix. Eat balanced meals  throughout the week. This includes: Fruit: 2-3 servings a day Vegetables: 4-5 servings a day Low-fat dairy: 2 servings a day Fish, poultry, or lean meat: 1 serving a day Beans and legumes: 2 or more servings a week Nuts and seeds: 1-2 servings a day Whole grains: 6-8 servings a day Extra-virgin olive oil: 3-4 servings a day Limit red meat and sweets to only a few servings a month What are my food choices? Mediterranean diet Recommended Grains: Whole-grain pasta. Brown rice. Bulgar wheat. Polenta. Couscous. Whole-wheat bread. [place] Made[place]. Vegetables: Artichokes. Beets. Broccoli. Cabbage. Carrots. Eggplant. Green beans. Chard. Kale. Spinach. Onions. Leeks. Peas. Squash. Tomatoes. Peppers. Radishes. Fruits: Apples. Apricots. Avocado. Berries. Bananas. Cherries. Dates. Figs. Grapes. Lemons. Melon. Oranges. Peaches. Plums. Pomegranate. Meats and other protein foods: Beans. Almonds. Sunflower seeds. Pine nuts. Peanuts. Cod. Salmon. Scallops. Shrimp. Tuna. Tilapia. Clams. Oysters. Eggs. Dairy: Low-fat milk. Cheese. Greek yogurt. Beverages: Water. Red wine. Herbal tea. Fats and oils: Extra virgin olive oil. Avocado oil. Grape seed oil. Sweets and desserts: Greek yogurt with honey. Baked apples. Poached pears. Trail mix. Seasoning and other foods: Basil. Cilantro. Coriander. Cumin. Mint. Parsley. Sage. Rosemary. Tarragon. Garlic. Oregano. Thyme. Pepper. Balsalmic vinegar. Tahini. Hummus. Tomato sauce. Olives. Mushrooms. Limit these Grains: Prepackaged pasta or rice dishes. Prepackaged  cereal with added sugar. Vegetables: Deep fried potatoes (french fries). Fruits: Fruit canned in syrup. Meats and other protein foods: Beef. Pork. Lamb. Poultry with skin. Hot dogs. [place]. Dairy: Ice cream. Sour cream. Whole milk. Beverages: Juice. Sugar-sweetened soft drinks. Beer. Liquor and spirits. Fats and oils: Butter. Canola oil. Vegetable oil. Beef fat (tallow). Lard. Sweets and desserts: Cookies.  Cakes. Pies. Candy. Seasoning and other foods: Mayonnaise. Premade sauces and marinades. The items listed may not be a complete list. Talk with your dietitian about what dietary choices are right for you. Summary The Mediterranean diet includes both food and lifestyle choices. Eat a variety of fresh fruits and vegetables, beans, nuts, seeds, and whole grains. Limit the amount of red meat and sweets that you eat. Talk with your health care provider about whether it is safe for you to drink red wine in moderation. This means 1 glass a day for nonpregnant women and 2 glasses a day for men. A glass of wine equals 5 oz (150 mL). This information is not intended to replace advice given to you by your health care provider. Make sure you discuss any questions you have with your health care provider. Document Released: 09/15/2015 Document Revised: 10/18/2015 Document Reviewed: 09/15/2015 Elsevier Interactive Patient Education  2017 [employer].

## 2023-02-21 DIAGNOSIS — H04123 Dry eye syndrome of bilateral lacrimal glands: Secondary | ICD-10-CM | POA: Diagnosis not present

## 2023-02-21 DIAGNOSIS — H35371 Puckering of macula, right eye: Secondary | ICD-10-CM | POA: Diagnosis not present

## 2023-02-21 DIAGNOSIS — Z961 Presence of intraocular lens: Secondary | ICD-10-CM | POA: Diagnosis not present

## 2023-02-21 DIAGNOSIS — H43813 Vitreous degeneration, bilateral: Secondary | ICD-10-CM | POA: Diagnosis not present

## 2023-03-25 ENCOUNTER — Encounter: Payer: Self-pay | Admitting: Psychology

## 2023-03-26 ENCOUNTER — Encounter: Payer: Self-pay | Admitting: Psychology

## 2023-03-26 ENCOUNTER — Ambulatory Visit: Payer: Medicare HMO

## 2023-03-26 ENCOUNTER — Ambulatory Visit: Payer: Medicare HMO | Admitting: Psychology

## 2023-03-26 DIAGNOSIS — G309 Alzheimer's disease, unspecified: Secondary | ICD-10-CM | POA: Diagnosis not present

## 2023-03-26 DIAGNOSIS — R4189 Other symptoms and signs involving cognitive functions and awareness: Secondary | ICD-10-CM

## 2023-03-26 DIAGNOSIS — F0280 Dementia in other diseases classified elsewhere without behavioral disturbance: Secondary | ICD-10-CM

## 2023-03-26 NOTE — Progress Notes (Signed)
   Psychometrician Note   Cognitive testing was administered to Shawn [name] by Shan Levans, B.S. (psychometrist) under the supervision of Dr. Newman Nickels, Ph.D., ABPP, licensed psychologist on 03/26/2023. Shawn [name] did not appear overtly distressed by the testing session per behavioral observation or responses across self-report questionnaires. Rest breaks were offered.    The battery of tests administered was selected by Dr. Newman Nickels, Ph.D., ABPP with consideration to Shawn [name]'s current level of functioning, the nature of his symptoms, emotional and behavioral responses during interview, level of literacy, observed level of motivation/effort, and the nature of the referral question. This battery was communicated to the psychometrist. Communication between Dr. Newman Nickels, Ph.D., ABPP and the psychometrist was ongoing throughout the evaluation and Dr. Newman Nickels, Ph.D., ABPP was immediately accessible at all times. Dr. Newman Nickels, Ph.D., ABPP provided supervision to the psychometrist on the date of this service to the extent necessary to assure the quality of all services provided.    Shawn [name] will return within approximately 1-2 weeks for an interactive feedback session with Dr. Milbert Coulter at which time his test performances, clinical impressions, and treatment recommendations will be reviewed in detail. Shawn [name] understands he can contact our office should he require our assistance before this time.  A total of 125 minutes of billable time were spent face-to-face with Shawn [name] by the psychometrist. This includes both test administration and scoring time. Billing for these services is reflected in the clinical report generated by Dr. Newman Nickels, Ph.D., ABPP  This note reflects time spent with the psychometrician and does not include test scores or any clinical interpretations made by Dr.  Milbert Coulter. The full report will follow in a separate note.

## 2023-03-26 NOTE — Progress Notes (Addendum)
NEUROPSYCHOLOGICAL EVALUATION [place] [place] Department of Neurology  Date of Evaluation: March 26, 2023  Reason for Referral:   Gaddiel Cullens is a 89 y.o. right-handed Caucasian male referred by Marlowe Kays, PA-C, to characterize his current cognitive functioning and assist with diagnostic clarity and treatment planning in the context of a prior dementia diagnosis of uncertain etiology and concern for progressive cognitive decline.   Assessment and Plan:   Clinical Impression(s): Mr. Arnell Asal Langenberg's pattern of performance is suggestive of severe impairment surrounding all aspects of learning and memory. Additional impairments were exhibited across cognitive flexibility and semantic fluency, while further performance variability was exhibited across processing speed, confrontation naming, and visuospatial abilities. Performances were appropriate relative to age-matched peers across basic attention, receptive language, and phonemic fluency. Functionally, his wife is at least somewhat involved in medication management, financial management, and bill paying. He also no longer drives due to cognitive concerns. Given cognitive and functional impairment, I agree with Dr. Roseanne Reno and feel that Mr. Evert Kohl continues to meet diagnostic criteria for a Major Neurocognitive Disorder ("dementia") at the present time.  Relative to his previous evaluation in January 2022, mild decline was exhibited across confrontation naming, as well as both delayed retrieval and recognition/consolidation aspects of memory. Outside of these areas, performances were largely stable across the two evaluations.   The etiology for his dementia presentation continues to be somewhat uncertain. Dr. Roseanne Reno previously expressed concerns surrounding corticobasal degeneration (CBD) given reported/observed asymmetric myoclonic jerking behaviors coupled with pronounced  visuospatial impairment, executive dysfunction, and language decline. He also hypothesized about an typical Alzheimer's disease presentation, while highlighting that CBD is often caused by underlying Alzheimer's disease pathology.   In my opinion, current testing aligns fairly well with an Alzheimer's disease presentation, albeit with a somewhat atypical initial presentation. Across memory testing, Mr. Yoan Sallade did not benefit from repeated exposure across learning trials, was fully amnestic (i.e., 0% retention) across all memory tasks after a brief delay, and performed poorly across yes/no recognition trials. This suggests evidence for rapid forgetting and a pronounced storage impairment which are the hallmark memory patterns for this illness. Evidence for progressive decline surrounding retention rates, storage capabilities, and confrontation naming would follow typical Alzheimer's disease trajectory. Evidence for a relatively slow rate of decline would align with Alzheimer's disease more so than typical CBD as the latter generally has a faster rate of decline. Mr. Arnell Asal [facility] wife also noted that concern for myoclonic behaviors has been greatly diminished lately and has not shown any progressive worsening over the prior three years, which would be unexpected. While I currently favor an atypically presenting Alzheimer's disease presentation, I cannot rule out the possibility for an initial CBD presentation with underlying Alzheimer's pathology and this presentation now more closely resembling typical Alzheimer's disease. Continued medical monitoring will be important moving forward.  Recommendations: Mr. Zaydenn Balaguer has already been prescribed medication aimed to address memory loss and concerns surrounding Alzheimer's disease (i.e., donepezil/Aricept and memantine/Namenda). He is encouraged to continue taking these medications as prescribed. It is important to highlight that these  medications have been shown to slow functional decline in some individuals. There is no current treatment which can stop or reverse cognitive decline when caused by a neurodegenerative illness.  Based upon previous and current testing, I agree with he and his wife's decision and also recommend that he fully abstain from all driving behaviors.  It will be important for  Mr. Bairon Klemann to have another person with him when in situations where he may need to process information, weigh the pros and cons of different options, and make decisions, in order to ensure that he fully understands and recalls all information to be considered.  If not already done, Mr. Jacey Pelc and his family may want to discuss his wishes regarding durable power of attorney and medical decision making, so that he can have input into these choices. If they require legal assistance with this, long-term care resource access, or other aspects of estate planning, they could reach out to [employer] at [phone] for a free consultation. Additionally, they may wish to discuss future plans for caretaking and seek out community options for in home/residential care should they become necessary.  Mr. Namon Villarin is encouraged to attend to lifestyle factors for brain health (e.g., regular physical exercise, good nutrition habits and consideration of the MIND-DASH diet, regular participation in cognitively-stimulating activities, and general stress management techniques), which are likely to have benefits for both emotional adjustment and cognition. Optimal control of vascular risk factors (including safe cardiovascular exercise and adherence to dietary recommendations) is encouraged. Continued participation in activities which provide mental stimulation and social interaction is also recommended.   Important information should be provided to Mr. Evert Kohl in written format in all instances. This information  should be placed in a highly frequented and easily visible location within his home to promote recall. External strategies such as written notes in a consistently used memory journal, visual and nonverbal auditory cues such as a calendar on the refrigerator or appointments with alarm, such as on a cell phone, can also help maximize recall.  To address problems with processing speed, he may wish to consider:   -Ensuring that he is alerted when essential material or instructions are being presented   -Adjusting the speed at which new information is presented   -Allowing for more time in comprehending, processing, and responding in conversation   -Repeating and paraphrasing instructions or conversations aloud  To address problems with fluctuating attention and/or executive dysfunction, he may wish to consider:   -Avoiding external distractions when needing to concentrate   -Limiting exposure to fast paced environments with multiple sensory demands   -Writing down complicated information and using checklists   -Attempting and completing one task at a time (i.e., no multi-tasking)   -Verbalizing aloud each step of a task to maintain focus   -Taking frequent breaks during the completion of steps/tasks to avoid fatigue   -Reducing the amount of information considered at one time   -Scheduling more difficult activities for a time of day where he is usually most alert  Review of Records:   Mr. Marck Mcclenny completed a comprehensive neuropsychological evaluation Clayborn Heron, Psy.D.) on 02/29/2020. Results suggested " very significant cognitive impairment with profound visuospatial problems and additional low scores on measures of processing speed, executive function, and working memory. 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 With respect to language function, his naming, repetition, and comprehension are generally intact but I question if he may not have some subtle  agrammatism given an extremely low score on extended testing with the [facility] Anagrams Test.  It is also possible that this finding simply represents general cognitive impairment as opposed to a language specific problem. Qualitatively, his speech seemed more halting than empty to me. Memory represented an area of relative strength, and while most of his scores were  psychometrically low, he did retain some information across time and seemed to benefit from recognition cuing. He screened negative for the presence of depression and was characterized as functioning at a very mild to mild dementia level by his wife. His CDR places him in the mild dementia range using the Sum of Boxes score, which is more appropriate given his atypical presentation. Mr. Raeshawn Vo is thus manifesting a dementia syndrome that is likely to be neurodegenerative in nature and is currently at a mild level of progression. 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 There is a differential and atypical Alzheimer's is also a possibility (and of course corticobasal syndrome itself is frequently caused by AD pathology). Vascular disease may be contributing but does not well explain his higher cortical signs." He was referred to outpatient neurology for ongoing care.  He was seen by [facility] initially in March 2022 and has been followed over time. He was most recently seen by Marlowe Kays, PA-C, on 02/19/2023 for follow-up of dementia concerns. At that time, his wife reported progressive memory decline. He acknowledged ongoing difficulties recalling names and details of recent conversations. Functionally, he no longer drives and his wife is involved in medication and  financial management. Performance on a brief cognitive screening instrument (MMSE) was 21/30. Ultimately, Mr. Matthewjames Petrasek was referred for a repeat neuropsychological evaluation to characterize his cognitive abilities and to assist with diagnostic clarity and treatment planning.   Neuroimaging: Brain MRI on 08/19/2015 suggested moderate microvascular ischemic disease. Brain MRI on 04/01/2020 suggested generalized parenchymal volume loss of unspecified severity and moderate to advanced microvascular ischemic disease. Brain MRI on 08/10/2021 suggested generalized volume loss of unspecified severity and microvascular ischemic disease of unspecified severity.   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   Generalized anxiety disorder 10/30/2021   Hemorrhoids    Hepatic cyst 11/19/2007   High serum vitamin D 01/12/2020   History of blood clots    History of colon polyps 11/02/2011   hyperplastic   Hyperlipidemia    Impacted cerumen 07/29/2009   Major depressive disorder 10/30/2021   Osteoarthritis    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   Otitis externa 07/07/2021   Overweight 10/05/2010   Pain of left thumb 02/20/2016   Pulmonary embolism (HCC) 11/20/2009   Right groin pain 11/16/2021   Thrombocytopenia 12/01/2009   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    Past Surgical History:  Procedure Laterality Date   APPENDECTOMY     CATARACT EXTRACTION  02/09/2007   right eye   INGUINAL HERNIA REPAIR     INGUINAL HERNIA REPAIR Right  01/05/2022   Procedure: RIGHT HERNIA REPAIR RECURRENT INGUINAL WITH THREE BY SIX POLYPROPYLENE MESH;  Surgeon: Violeta Gelinas, MD;  Location: [facility] OR;  Service: General;  Laterality: Right;  GEN AND  TAP BLOCK   TONSILLECTOMY     VARICOSE VEIN SURGERY     VASECTOMY      Current Outpatient Medications:    ascorbic acid (VITAMIN C) 500 MG tablet, Take 500 mg by mouth daily with lunch., Disp: , Rfl:    Cholecalciferol 125 MCG (5000 UT) TABS, Take 5,000 Units by mouth every Monday, Tuesday, Wednesday, Thursday, and Friday.  With lunch, Disp: , Rfl:    cyanocobalamin (VITAMIN B12) 1000 MCG tablet, Take 1,000 mcg by mouth daily., Disp: , Rfl:    donepezil (ARICEPT) 10 MG tablet, TAKE 1 TABLET BY MOUTH EVERY DAY, Disp: 90 tablet, Rfl: 3   ELIQUIS 5 MG TABS tablet, TAKE 1 TABLET BY MOUTH TWICE A DAY, Disp: 180 tablet, Rfl: 1   ezetimibe (ZETIA) 10 MG tablet, Take 1 tablet (10 mg total) by mouth daily., Disp: 90 tablet, Rfl: 3   memantine (NAMENDA) 5 MG tablet, Take 1 tablet (5 mg at night) for 2 weeks, then increase to 1 tablet (5 mg) twice a day, Disp: 180 tablet, Rfl: 3   Multiple Vitamin (MULTIVITAMIN WITH MINERALS) TABS tablet, Take 1 tablet by mouth daily with lunch., Disp: , Rfl:    pantoprazole (PROTONIX) 40 MG tablet, Take 1 tablet (40 mg total) by mouth daily., Disp: 90 tablet, Rfl: 3   saw palmetto 160 MG capsule, Take 320 mg by mouth daily with lunch., Disp: , Rfl:    sertraline (ZOLOFT) 50 MG tablet, Take 1 tablet (50 mg total) by mouth daily., Disp: 90 tablet, Rfl: 3  Clinical Interview:   The following information was obtained during a clinical interview with Mr. Cheng Dec and his wife prior to cognitive testing.  Cognitive Symptoms: Decreased short-term memory: Endorsed. He described symptoms of generalized and stable forgetfulness but generally denied specific examples with direct questioning. His wife reported her perception of progressive decline over time, including the time  since his previous January 2022 neuropsychological evaluation. She provided examples surrounding trouble recalling names and details of recent conversations.  Decreased long-term memory: Denied. Decreased attention/concentration: Endorsed. He reported ongoing trouble with sustained focus and increased distractibility. His wife was in agreement.  Reduced processing speed: Endorsed "maybe, hard to tell." Difficulties with executive functions: Denied. His wife reported Mr. Evert Kohl having an inability to multi-task, noting that she must provide a single task or instruction at a time. Difficulties with emotion regulation: Denied. His wife expressed concern surrounding personality changes surrounding Mr. Evert Kohl being "less tolerant" of others and more easily agitated or irritated.  Difficulties with receptive language: Denied. Difficulties with word finding: Endorsed. His wife was in agreement.  Decreased visuoperceptual ability: Denied.  Difficulties completing ADLs: Mr. Dontai Pember denied all concerns and reported ongoing independence. Per medical records, his wife is in charge of medication management. She is also in charge of financial management and bill paying; however, Mr. Yoni Lobos maintains some involvement.   Additional Medical History: History of traumatic brain injury/concussion: Denied. His wife clarified that Mr. Arnell Asal Langenberg fell about 1.5 years prior, striking his head. She denied a loss in consciousness but did report that he seemed dazed and disoriented for a period of time afterwards. No persisting symptoms were reported. No other head injuries were described. History of stroke: Denied. His wife did state that he was diagnosed with heat stroke when he was around 89 years old.  History of seizure activity: Denied. History of known exposure to toxins: Denied. Symptoms of  chronic pain: Endorsed. He reported diffuse arthritic pain, as well as  some more localized hip pain.  Experience of frequent headaches/migraines: Denied. Frequent instances of dizziness/vertigo: Denied.  Sensory changes: He wears glasses with benefit. While he denied hearing loss, his wife reported ongoing concerns that may warrant hearing aid intervention. She also noted that Mr. Evert Kohl commonly makes statements surrounding food having no taste.  Balance/coordination difficulties: Endorsed. He described a tendency to walk with a forward lean and noted the perception of ongoing instability.  Other motor difficulties: Mr. Freman Lapage denied all current or past motor concerns. His wife clarified that he has experienced involuntary movements involving his right arm and hand over the course of several years. This is consistent with that is reported throughout his medical records, including his previous neuropsychological evaluation in January 2022 where Dr. Roseanne Reno observed myoclonic movements in Mr. Arnell Asal Langenberg's right hand. Currently, his wife noted that these behaviors have seemed to be less and less frequent.   Sleep History: Estimated hours obtained each night: 8 hours Difficulties falling asleep: Denied. Difficulties staying asleep: Denied. Feels rested and refreshed upon awakening: Denied. He reported waking still feeling fairly fatigued and not well rested.   History of snoring: Denied. His wife clarified that snoring is a concern and is at least partially responsible for them sleeping in separate bedrooms.  History of waking up gasping for air: Denied. Witnessed breath cessation while asleep: Denied.  History of vivid dreaming: Endorsed. Excessive movement while asleep: Denied. Instances of acting out his dreams: Denied. His wife clarified that he will talk in his sleep. She has also described past observations where he has had some dream reenactment while asleep and may reach for something that it not there.   Psychiatric/Behavioral  Health History: Depression: Mr. Gehrig Patras denied all previous mental health concerns or current medication intervention. Medical records suggest some situational depression surrounding his inability to drive and the passing of several friends. His wife clarified that he has been prescribed Zoloft and that this medication has been helpful at managing current mood symptoms. Current or remote suicidal ideation, intent, or plan was denied.  Anxiety: Denied. Mania: Denied. Trauma History: Denied. Visual/auditory hallucinations: Denied. His wife noted that this was a concern after first starting donepezil. However, this seemed to work itself out and neither of them described any recent experiences.  Delusional thoughts: Denied.  Tobacco: Denied. Alcohol: He denied current alcohol consumption as well as a history of problematic alcohol abuse or dependence.  Recreational drugs: Denied.  Family History: Problem Relation Age of Onset   Lung cancer Father    Heart attack Mother 65   Heart disease Mother        CHF   Arthritis Mother    Colon cancer Neg Hx    Esophageal cancer Neg Hx    Rectal cancer Neg Hx    Stomach cancer Neg Hx    This information was confirmed by Mr. Evert Kohl.  Academic/Vocational History: Highest level of educational attainment: 12 years. He graduated from high school and described himself as a good (A/B) student in academic settings. No relative weaknesses were identified.  History of developmental delay: Denied. History of grade repetition: Denied. Enrollment in special education courses: Denied. History of LD/ADHD: Denied.  Employment: He served in the [employer] for 6 years and was not involved in combat. After this, he worked in [employer] until his retirement.   Evaluation Results:  Behavioral Observations: Mr. Caydin Yeatts was accompanied by his wife, arrived to his appointment on time, and was appropriately dressed and  groomed. He appeared alert. Observed gait and station were within normal limits. Some potential subtle tremor/myoclonic jerking was observed in his right hand during interview. However, this was difficult to determine as he largely kept his hands folded in his lap throughout. His affect was generally relaxed and positive, but did range appropriately given the subject being discussed during interview. Spontaneous speech was fluent and word finding difficulties were not observed during the clinical interview. Thought processes were coherent and normal in content. However, there was significant concern that Mr. Evert Kohl was not being an honest responder. He was quick to deny all concerns and commonly contradicted his medical record. His wife provided accurate information based upon previous medical documentation and was viewed as a more reliable historian. Insight into his cognitive difficulties appeared quite poor and I do fear that he does not have a full appreciation for the extent of ongoing impairment. However, as he appeared an unreliable historian, it remains unclear if this is true lack of insight or him attempting to portray himself more favorably during interview.   During testing, some involuntary movements were noted surrounding a bilateral shoulder rolling behavior. He was also noted to engage in quicker and more shallow breathing during memory testing. However, no myoclonic jerking behaviors were observed. Sustained attention was appropriate. Task engagement was adequate and he persisted when challenged. He did fatigue as the evaluation progressed. It was mildly abbreviated in response. Overall, Mr. Johndavid Geralds was cooperative with the clinical interview and subsequent testing procedures.   Adequacy of Effort: The validity of neuropsychological testing is limited by the extent to which the individual being tested may be assumed to have exerted adequate effort during testing. Mr. Remberto Lienhard expressed his intention to perform to the best of his abilities and exhibited adequate task engagement and persistence. Scores across stand-alone and embedded performance validity measures were variable. However, his sole below expectation performance is likely due to true and severe memory loss rather than poor engagement or attempts to perform poorly. As such, the results of the current evaluation are believed to be a valid representation of Mr. Arnell Asal Langenberg's current cognitive functioning.  Test Results: Mr. Perlie Scheuring was poorly oriented at the time of the current evaluation. He incorrectly stated his age 89"). He was also unable to state the correct date, day of the week, or name of the clinic.  Intellectual abilities based upon educational and vocational attainment were estimated to be in the average range. Premorbid abilities were estimated to be within the below average range based upon a single-word reading test.   Processing speed was variable, ranging from the exceptionally low to below average normative ranges. Basic attention was average. More complex attention (e.g., working memory) was unable to be assessed. Cognitive flexibility was exceptionally low. Other aspects of executive functioning were unable to be assessed.  Assessed receptive language abilities were unable to be directly assessed. However, Mr. Lindsay Soulliere did not exhibit prominent difficulties comprehending task instructions and answered all questions asked of him appropriately. Assessed expressive language was variable. Phonemic fluency was average, semantic fluency was exceptionally low, and confrontation naming was average across a screening task but well below average across a more comprehensive task.   Assessed visuospatial/visuoconstructional abilities were variable, ranging from the exceptionally low to average normative ranges. Across his drawing of  a clock, he exhibited significant  numerical spacing difficulties, with essentially no numbers being placed in the lower right or upper left quadrants. Clock hands were also placed incorrectly. Points were lost on his drawing of a complex figure due to numerous omissions across both external and internal aspects.     Learning (i.e., encoding) of novel verbal information was exceptionally low. Spontaneous delayed recall (i.e., retrieval) of previously learned information was also exceptionally low. Retention rates were 0% across a list learning task, 0% across a story learning task, and 0% across a figure drawing task. Performance across recognition tasks was exceptionally low to below average, suggesting very limited evidence for information consolidation.   Results of emotional screening instruments suggested that recent symptoms of generalized anxiety were in the mild range, while symptoms of depression were within normal limits. A screening instrument assessing recent sleep quality suggested the presence of minimal sleep dysfunction.  Tables of Scores:   Note: This summary of test scores accompanies the interpretive report and should not be considered in isolation without reference to the appropriate sections in the text. Descriptors are based on appropriate normative data and may be adjusted based on clinical judgment. Terms such as "Within Normal Limits" and "Outside Normal Limits" are used when a more specific description of the test score cannot be determined. Descriptors refer to the current evaluation only.        Percentile - Normative Descriptor > 98 - Exceptionally High 91-97 - Well Above Average 75-90 - Above Average 25-74 - Average 9-24 - Below Average 2-8 - Well Below Average < 2 - Exceptionally Low        Validity: January 2022 Current  DESCRIPTOR        DCT: --- --- --- Within Normal Limits  RBANS EI: --- --- --- Outside Normal Limits        Orientation:       Raw Score Raw Score Percentile   NAB  Orientation, Form 1 --- 22/29 --- ---        Cognitive Screening:       Raw Score Raw Score Percentile   MOCA: 14/30 --- --- ---  SLUMS: --- 12/30 --- ---        RBANS, Form A: Standard Score/ Scaled Score Standard Score/ Scaled Score Percentile   Total Score --- 57 <1 Exceptionally Low  Immediate Memory --- 57 <1 Exceptionally Low    List Learning --- 3 1 Exceptionally Low    Story Memory --- 3 1 Exceptionally Low  Visuospatial/Constructional 64 72 3 Well Below Average    Figure Copy 1 1 <1 Exceptionally Low    Line Orientation 14/20 16/20 26-50 Average  Language --- 80 9 Below Average    Picture Naming --- 9/10 26-50 Average    Semantic Fluency --- 2 <1 Exceptionally Low  Attention --- 72 3 Well Below Average    Digit Span --- 10 50 Average    Coding 1 1 <1 Exceptionally Low  Delayed Memory --- 52 <1 Exceptionally Low    List Recall --- 0/10 <2 Exceptionally Low    List Recognition --- 15/20 <2 Exceptionally Low    Story Recall --- 1 <1 Exceptionally Low    Story Recognition --- 5/12 1-4 Exceptionally Low  to Well Below Average    Figure Recall 4 1 <1 Exceptionally Low    Figure Recognition --- 3/8 6-20 Well Below Average  to Below Average         Intellectual Functioning:  Standard Score Standard Score Percentile   Test of Premorbid Functioning: --- 88 21 Below Average         Standard Score Standard Score Percentile   WRAT-4 Reading: 93 --- --- ---        Memory:      NAB Memory Module, Form 1: T Score T Score Percentile   List Learning        Total Trials 1-3 10/36 (33) --- --- ---    List B 1/12 (36) --- --- ---    Short Delay Free Recall 1/12 (33) --- --- ---    Long Delay Free Recall 1/12 (37) --- --- ---    Retention Percentage 100 --- --- ---    Recognition Discriminability 5 --- --- ---  Story Learning        Immediate Recall 30/80 (34) --- --- ---    Delayed Recall 0/40 (36) --- --- ---    Retention Percentage 0 --- --- ---  Daily Living Memory         Immediate Recall 22/51 (35) --- --- ---    Delayed Recall 3/17 (26) --- --- ---    Retention Percentage 27 --- --- ---    Recognition Hits 6/10 --- --- ---        Attention/Executive Function:      Trail Making Test (TMT): T Score Raw Score (T Score) Percentile     Part A 40 81 secs.,  0 errors (27) 1 Exceptionally Low    Part B 25 Discontinued --- Impaired          Scaled Score Scaled Score Percentile   WAIS-5 Coding: --- 6 9 Below Average  WAIS-5 Naming Speed Quantity: --- 7 16 Below Average        NAB Attention Module, Form 1: T Score T Score Percentile     Digits Forward 47 --- --- ---    Digits Backwards 33 --- --- ---        Language:      Verbal Fluency Test: Raw Score (Scaled Score) Raw Score (Scaled Score) Percentile     Phonemic Fluency (CFL) --- 25 (8) 25 Average    Category Fluency --- 12 (2) <1 Exceptionally Low  *Based on [employer] Older Normative Studies (MOANS)             T Score T Score Percentile     Phonemic Fluency (FAS) 32 --- -- ---    Animal Fluency 20 --- --- ---        NAB Language Module, Form 1: T Score T Score Percentile     Naming 28/31 (47) 23/31 (33) 5 Well Below Average        Visuospatial/Visuoconstruction:       Raw Score Raw Score Percentile   Clock Drawing: 4/10 6/10 --- Impaired  [place]oper VOT: --- 10 --- Very High Probability Of Impairment         Scaled Score Scaled Score Percentile   WAIS-5 Block Design: --- 5 5 Well Below Average        Mood and Personality:       Raw Score Raw Score Percentile   Geriatric Depression Scale: --- 6 --- Within Normal Limits  Geriatric Anxiety Scale: --- 12 --- Mild    Somatic --- 5 --- Minimal    Cognitive --- 2 --- Minimal    Affective --- 5 --- Mild        Additional Questionnaires:       Raw Score Raw  Score Percentile   PROMIS Sleep Disturbance Questionnaire: --- 10 --- None to Slight   Informed Consent and Coding/Compliance:   The current evaluation represents a clinical evaluation  for the purposes previously outlined by the referral source and is in no way reflective of a forensic evaluation.   Mr. Dray Dente was provided with a verbal description of the nature and purpose of the present neuropsychological evaluation. Also reviewed were the foreseeable risks and/or discomforts and benefits of the procedure, limits of confidentiality, and mandatory reporting requirements of this provider. The patient was given the opportunity to ask questions and receive answers about the evaluation. Oral consent to participate was provided by the patient.   This evaluation was conducted by Newman Nickels, Ph.D., ABPP-CN, board certified clinical neuropsychologist. Mr. Abdulrahman Bracey completed a clinical interview with Dr. Milbert Coulter, billed as one unit [phone], and 125 minutes of cognitive testing and scoring, billed as one unit [phone] and three additional units [id number]. Psychometrist Shan Levans, B.S. assisted Dr. Milbert Coulter with test administration and scoring procedures. As a separate and discrete service, one unit [id number] and two units [phone] were billed for Dr. Tammy Sours time spent in interpretation and report writing.

## 2023-04-02 ENCOUNTER — Ambulatory Visit: Payer: Medicare HMO | Admitting: Psychology

## 2023-04-02 DIAGNOSIS — G309 Alzheimer's disease, unspecified: Secondary | ICD-10-CM

## 2023-04-02 DIAGNOSIS — F0280 Dementia in other diseases classified elsewhere without behavioral disturbance: Secondary | ICD-10-CM | POA: Diagnosis not present

## 2023-04-02 NOTE — Progress Notes (Signed)
   Neuropsychology Feedback Session Shawn [name]. [facility] Department of Neurology  Reason for Referral:   Shawn [name] is a 89 y.o. right-handed Caucasian male referred by Shawn Kays, PA-C, to characterize his current cognitive functioning and assist with diagnostic clarity and treatment planning in the context of a prior dementia diagnosis of uncertain etiology and concern for progressive cognitive decline.   Feedback:   Shawn [name] completed a comprehensive neuropsychological evaluation on 03/26/2023. Please refer to that encounter for the full report and recommendations. Briefly, results suggested severe impairment surrounding all aspects of learning and memory. Additional impairments were exhibited across cognitive flexibility and semantic fluency, while further performance variability was exhibited across processing speed, confrontation naming, and visuospatial abilities. Relative to his previous evaluation in January 2022, mild decline was exhibited across confrontation naming, as well as both delayed retrieval and recognition/consolidation aspects of memory. Outside of these areas, performances were largely stable across the two evaluations.  The etiology for his dementia presentation continues to be somewhat uncertain. Dr. Roseanne Reno previously expressed concerns surrounding corticobasal degeneration (CBD) given reported/observed asymmetric myoclonic jerking behaviors coupled with pronounced visuospatial impairment, executive dysfunction, and language decline. He also hypothesized about an atypical Alzheimer's disease presentation, while highlighting that CBD is often caused by underlying Alzheimer's disease pathology. In my opinion, current testing aligns fairly well with an Alzheimer's disease presentation, albeit with a somewhat atypical initial presentation. Across memory testing, Shawn [name] did not benefit from repeated exposure across  learning trials, was fully amnestic (i.e., 0% retention) across all memory tasks after a brief delay, and performed poorly across yes/no recognition trials. This suggests evidence for rapid forgetting and a pronounced storage impairment which are the hallmark memory patterns for this illness. Evidence for progressive decline surrounding retention rates, storage capabilities, and confrontation naming would follow typical Alzheimer's disease trajectory. Evidence for a relatively slow rate of decline would align with Alzheimer's disease more so than typical CBD as the latter generally has a faster rate of decline. Shawn [name] wife also noted that concern for myoclonic behaviors has been greatly diminished lately and has not shown any progressive worsening over the prior three years, which would be unexpected. While I currently favor an atypically presenting Alzheimer's disease presentation, I cannot rule out the possibility for an initial CBD presentation with underlying Alzheimer's pathology and this presentation now more closely resembling typical Alzheimer's disease. Continued medical monitoring will be important moving forward.  Shawn [name] was accompanied by his wife during the current feedback session. Content of the current session focused on the results of his neuropsychological evaluation. Shawn [name]. Shawn [name] was given the opportunity to ask questions and his questions were answered. He was encouraged to reach out should additional questions arise. A copy of his report was provided at the conclusion of the visit.      One unit [phone] was billed for Dr. Tammy Sours time spent preparing for, conducting, and documenting the current feedback session with Shawn [name]. Shawn [name].

## 2023-04-10 DIAGNOSIS — Z008 Encounter for other general examination: Secondary | ICD-10-CM | POA: Diagnosis not present

## 2023-04-25 NOTE — Progress Notes (Unsigned)
  Cardiology Office Note:   Date:  04/26/2023  ID:  Shawn [name], DOB Sep 19, 1934, MRN [id number] PCP: Zola Button, Grayling Congress, DO  [facility] Providers Cardiologist:  Rollene Rotunda, MD {  History of Present Illness:   Shawn [name] is a 89 y.o. male will present for office visit. He has past medical history of mild aortic valve sclerosis with cardiac murmur, hyperlipidemia, factor V Leiden mutation and history of DVT/PE on coumadin.   He was seen in July last year for near syncope.     He presents for follow-up.  Since I last saw him he has done well.  His wife said he does jigsaw puzzles and is not active enough.  With his minimal activity he denies any cardiovascular symptoms. The patient denies any new symptoms such as chest discomfort, neck or arm discomfort. There has been no new shortness of breath, PND or orthopnea. There have been no reported palpitations, presyncope or syncope.   ROS: As stated in the HPI and negative for all other systems.  Studies Reviewed:    EKG:   EKG Interpretation Date/Time:  Friday April 26 2023 14:07:31 EDT Ventricular Rate:  65 PR Interval:  210 QRS Duration:  88 QT Interval:  396 QTC Calculation: 411 R Axis:   32  Text Interpretation: Sinus rhythm with 1st degree A-V block When compared with ECG of 10-Aug-2021 16:34, No significant change since last tracing Confirmed by Rollene Rotunda ([id number]) on 04/26/2023 2:29:12 PM     Risk Assessment/Calculations:          Physical Exam:   VS:  BP 114/62 (BP Location: Left Arm, Patient Position: Sitting, Cuff Size: Normal)   Pulse 94   Ht 5\' 7"  (1.702 m)   Wt 173 lb 6.4 oz (78.7 kg)   SpO2 94%   BMI 27.16 kg/m    Wt Readings from Last 3 Encounters:  04/26/23 173 lb 6.4 oz (78.7 kg)  02/19/23 173 lb (78.5 kg)  01/17/23 170 lb 12.8 oz (77.5 kg)     GEN: Well nourished, well developed in no acute distress NECK: No JVD; No carotid bruits CARDIAC: RRR, 2/6 apical  systolic murmur, no diastolic murmurs, rubs, gallops RESPIRATORY:  Clear to auscultation without rales, wheezing or rhonchi  ABDOMEN: Soft, non-tender, non-distended EXTREMITIES:  No edema; No deformity   ASSESSMENT AND PLAN:   DVT/PE:    He is on chronic anticoagulation.  He tolerates this.  No change in therapy.   AS:   This was moderate on echo in July 2023.  He has no change in his symptoms and his physical exam would not suggest worsening aortic stenosis.  I will follow this clinically.  HTN:    His blood pressure is at target.  No change in therapy.  At target.  No change in therapy.     Follow up with me in one year.   Signed, Rollene Rotunda, MD

## 2023-04-26 ENCOUNTER — Encounter: Payer: Self-pay | Admitting: Cardiology

## 2023-04-26 ENCOUNTER — Ambulatory Visit: Payer: Medicare HMO | Attending: Cardiology | Admitting: Cardiology

## 2023-04-26 VITALS — BP 114/62 | HR 94 | Ht 67.0 in | Wt 173.4 lb

## 2023-04-26 DIAGNOSIS — I359 Nonrheumatic aortic valve disorder, unspecified: Secondary | ICD-10-CM | POA: Diagnosis not present

## 2023-04-26 DIAGNOSIS — Z8249 Family history of ischemic heart disease and other diseases of the circulatory system: Secondary | ICD-10-CM

## 2023-04-26 DIAGNOSIS — E785 Hyperlipidemia, unspecified: Secondary | ICD-10-CM

## 2023-04-26 DIAGNOSIS — I10 Essential (primary) hypertension: Secondary | ICD-10-CM | POA: Diagnosis not present

## 2023-04-26 NOTE — Patient Instructions (Signed)

## 2023-04-29 ENCOUNTER — Ambulatory Visit: Payer: Self-pay

## 2023-04-29 ENCOUNTER — Institutional Professional Consult (permissible substitution): Payer: Medicare HMO | Admitting: Psychology

## 2023-05-06 ENCOUNTER — Encounter: Payer: Medicare HMO | Admitting: Psychology

## 2023-05-31 ENCOUNTER — Encounter: Payer: Self-pay | Admitting: Physician Assistant

## 2023-07-05 ENCOUNTER — Ambulatory Visit: Admitting: Physician Assistant

## 2023-07-10 ENCOUNTER — Other Ambulatory Visit: Payer: Self-pay | Admitting: Physician Assistant

## 2023-08-06 ENCOUNTER — Encounter: Payer: Self-pay | Admitting: Physician Assistant

## 2023-08-06 ENCOUNTER — Ambulatory Visit: Admitting: Physician Assistant

## 2023-08-06 VITALS — BP 139/77 | HR 75 | Resp 20 | Ht 67.0 in | Wt 174.0 lb

## 2023-08-06 DIAGNOSIS — R269 Unspecified abnormalities of gait and mobility: Secondary | ICD-10-CM | POA: Diagnosis not present

## 2023-08-06 MED ORDER — MEMANTINE HCL 10 MG PO TABS: ORAL_TABLET | ORAL | 3 refills | Status: AC

## 2023-08-06 MED ORDER — DONEPEZIL HCL 10 MG PO TABS: 10.0000 mg | ORAL_TABLET | Freq: Every day | ORAL | 3 refills | Status: DC

## 2023-08-06 NOTE — Patient Instructions (Addendum)
 It was a pleasure to see you today at our office.   Recommendations:  Follow up in 6  months  Continue donepezil  10 mg daily.    Increase memantine  to 10 mg twice a day.    Referral to physical therapy for strength and balance this will be at [facility] to call:  Memory  decline, memory medications: Call our office [phone]   For psychiatric meds, mood meds: Please have your primary care physician manage these medications.   Counseling regarding caregiver distress, including caregiver depression, anxiety and issues regarding community resources, adult day care programs, adult living facilities, or memory care questions:   Feel free to contact Misty Waddell Simmer, Social Worker at [phone]      Consider [facility]  [address], [place] [address] [phone]  Hours of Operation Mondays to Thursdays: 8 am to 8 pm,Fridays: 9 am to 8 pm, Saturdays: 9 am to 1 pm Sundays: Closed  [email or web address]://[email or web address].gov/departments/parks-recreation/active-adults-50/[facility]  If you have any severe symptoms of a stroke, or other severe issues such as confusion,severe chills or fever, etc call 911 or go to the ER as you may need to be evaluated further   Feel free to visit Facebook page  [employer] for tips of how to care for people with memory problems.     RECOMMENDATIONS FOR ALL PATIENTS WITH MEMORY PROBLEMS: 1. Continue to exercise (Recommend 30 minutes of walking everyday, or 3 hours every week) 2. Increase social interactions - continue going to [place] and enjoy social gatherings with friends and family 3. Eat healthy, avoid fried foods and eat more fruits and vegetables 4. Maintain adequate blood pressure, blood sugar, and blood cholesterol level. Reducing the risk of stroke and cardiovascular disease also helps promoting better memory. 5. Avoid stressful situations. Live a simple life and avoid aggravations. Organize  your time and prepare for the next day in anticipation. 6. Sleep well, avoid any interruptions of sleep and avoid any distractions in the bedroom that may interfere with adequate sleep quality 7. Avoid sugar, avoid sweets as there is a strong link between excessive sugar intake, diabetes, and cognitive impairment We discussed the Mediterranean diet, which has been shown to help patients reduce the risk of progressive memory disorders and reduces cardiovascular risk. This includes eating fish, eat fruits and green leafy vegetables, nuts like almonds and hazelnuts, walnuts, and also use olive oil. Avoid fast foods and fried foods as much as possible. Avoid sweets and sugar as sugar use has been linked to worsening of memory function.  There is always a concern of gradual progression of memory problems. If this is the case, then we may need to adjust level of care according to patient needs. Support, both to the patient and caregiver, should then be put into place.    The Alzheimer's Association is here all day, every day for people facing Alzheimer's disease through our free 24/7 Helpline: [phone]. The Helpline provides reliable information and support to all those who need assistance, such as individuals living with memory loss, Alzheimer's or other dementia, caregivers, health care professionals and the public.  Our highly trained and knowledgeable staff can help you with: Understanding memory loss, dementia and Alzheimer's  Medications and other treatment options  General information about aging and brain health  Skills to provide quality care and to find the best care from professionals  Legal, financial and living-arrangement decisions Our Helpline also features: Confidential care consultation provided by  master's level clinicians who can help with decision-making support, crisis assistance and education on issues families face every day  Help in a caller's preferred language using our  translation service that features more than 200 languages and dialects  Referrals to local community programs, services and ongoing support     FALL PRECAUTIONS: Be cautious when walking. Scan the area for obstacles that may increase the risk of trips and falls. When getting up in the mornings, sit up at the edge of the bed for a few minutes before getting out of bed. Consider elevating the bed at the head end to avoid drop of blood pressure when getting up. Walk always in a well-lit room (use night lights in the walls). Avoid area rugs or power cords from appliances in the middle of the walkways. Use a walker or a cane if necessary and consider physical therapy for balance exercise. Get your eyesight checked regularly.  FINANCIAL OVERSIGHT: Supervision, especially oversight when making financial decisions or transactions is also recommended.  HOME SAFETY: Consider the safety of the kitchen when operating appliances like stoves, microwave oven, and blender. Consider having supervision and share cooking responsibilities until no longer able to participate in those. Accidents with firearms and other hazards in the house should be identified and addressed as well.   ABILITY TO BE LEFT ALONE: If patient is unable to contact 911 operator, consider using LifeLine, or when the need is there, arrange for someone to stay with patients. Smoking is a fire hazard, consider supervision or cessation. Risk of wandering should be assessed by caregiver and if detected at any point, supervision and safe proof recommendations should be instituted.  MEDICATION SUPERVISION: Inability to self-administer medication needs to be constantly addressed. Implement a mechanism to ensure safe administration of the medications.   DRIVING: Regarding driving, in patients with progressive memory problems, driving will be impaired. We advise to have someone else do the driving if trouble finding directions or if minor accidents are  reported. Independent driving assessment is available to determine safety of driving.   If you are interested in the driving assessment, you can contact the following:  [employer] in [place] [phone]  [facility] [phone]  [facility] [phone] or [phone]      Mediterranean Diet A Mediterranean diet refers to food and lifestyle choices that are based on the traditions of countries located on the [employer]. This way of eating has been shown to help prevent certain conditions and improve outcomes for people who have chronic diseases, like kidney disease and heart disease. What are tips for following this plan? Lifestyle  Cook and eat meals together with your family, when possible. Drink enough fluid to keep your urine clear or pale yellow. Be physically active every day. This includes: Aerobic exercise like running or swimming. Leisure activities like gardening, walking, or housework. Get 7-8 hours of sleep each night. If recommended by your health care provider, drink red wine in moderation. This means 1 glass a day for nonpregnant women and 2 glasses a day for men. A glass of wine equals 5 oz (150 mL). Reading food labels  Check the serving size of packaged foods. For foods such as rice and pasta, the serving size refers to the amount of cooked product, not dry. Check the total fat in packaged foods. Avoid foods that have saturated fat or trans fats. Check the ingredients list for added sugars, such as corn syrup. Shopping  At the grocery store, buy most of your food from the areas near the walls of the store. This includes: Fresh fruits and vegetables (produce). Grains, beans, nuts, and seeds. Some of these may be available in unpackaged forms or large amounts (in bulk). Fresh seafood. Poultry and eggs. Low-fat dairy products. Buy whole ingredients instead of prepackaged  foods. Buy fresh fruits and vegetables in-season from local farmers markets. Buy frozen fruits and vegetables in resealable bags. If you do not have access to quality fresh seafood, buy precooked frozen shrimp or canned fish, such as tuna, salmon, or sardines. Buy small amounts of raw or cooked vegetables, salads, or olives from the deli or salad bar at your store. Stock your pantry so you always have certain foods on hand, such as olive oil, canned tuna, canned tomatoes, rice, pasta, and beans. Cooking  Cook foods with extra-virgin olive oil instead of using butter or other vegetable oils. Have meat as a side dish, and have vegetables or grains as your main dish. This means having meat in small portions or adding small amounts of meat to foods like pasta or stew. Use beans or vegetables instead of meat in common dishes like chili or lasagna. Experiment with different cooking methods. Try roasting or broiling vegetables instead of steaming or sauteing them. Add frozen vegetables to soups, stews, pasta, or rice. Add nuts or seeds for added healthy fat at each meal. You can add these to yogurt, salads, or vegetable dishes. Marinate fish or vegetables using olive oil, lemon juice, garlic, and fresh herbs. Meal planning  Plan to eat 1 vegetarian meal one day each week. Try to work up to 2 vegetarian meals, if possible. Eat seafood 2 or more times a week. Have healthy snacks readily available, such as: Vegetable sticks with hummus. Greek yogurt. Fruit and nut trail mix. Eat balanced meals throughout the week. This includes: Fruit: 2-3 servings a day Vegetables: 4-5 servings a day Low-fat dairy: 2 servings a day Fish, poultry, or lean meat: 1 serving a day Beans and legumes: 2 or more servings a week Nuts and seeds: 1-2 servings a day Whole grains: 6-8 servings a day Extra-virgin olive oil: 3-4 servings a day Limit red meat and sweets to only a few servings a month What are my food  choices? Mediterranean diet Recommended Grains: Whole-grain pasta. Brown rice. Bulgar wheat. Polenta. Couscous. Whole-wheat bread. [place] Made[place]. Vegetables: Artichokes. Beets. Broccoli. Cabbage. Carrots. Eggplant. Green beans. Chard. Kale. Spinach. Onions. Leeks. Peas. Squash. Tomatoes. Peppers. Radishes. Fruits: Apples. Apricots. Avocado. Berries. Bananas. Cherries. Dates. Figs. Grapes. Lemons. Melon. Oranges. Peaches. Plums. Pomegranate. Meats and other protein foods: Beans. Almonds. Sunflower seeds. Pine nuts. Peanuts. Cod. Salmon. Scallops. Shrimp. Tuna. Tilapia. Clams. Oysters. Eggs. Dairy: Low-fat milk. Cheese. Greek yogurt. Beverages: Water. Red wine. Herbal tea. Fats and oils: Extra virgin olive oil. Avocado oil. Grape seed oil. Sweets and desserts: [place] yogurt with honey. Baked apples. Poached pears. Trail mix. Seasoning and other foods: Basil. Cilantro. Coriander. Cumin. Mint. Parsley. Sage. Rosemary. Tarragon. Garlic. Oregano. Thyme. Pepper. Balsalmic vinegar. Tahini. Hummus. Tomato sauce. Olives. Mushrooms. Limit these Grains: Prepackaged pasta or rice dishes. Prepackaged cereal with added sugar. Vegetables: Deep fried potatoes (french fries). Fruits: Fruit canned in syrup. Meats and other protein foods: Beef. Pork. Lamb. Poultry with skin. Hot dogs. [place]. Dairy: Ice cream. Sour cream. Whole milk. Beverages: Juice. Sugar-sweetened soft drinks. Beer. Liquor and spirits. Fats and oils: Butter. Canola oil. Vegetable oil. Beef fat (tallow). Lard. Sweets and desserts: Cookies.  Cakes. Pies. Candy. Seasoning and other foods: Mayonnaise. Premade sauces and marinades. The items listed may not be a complete list. Talk with your dietitian about what dietary choices are right for you. Summary The Mediterranean diet includes both food and lifestyle choices. Eat a variety of fresh fruits and vegetables, beans, nuts, seeds, and whole grains. Limit the amount of red meat and sweets that  you eat. Talk with your health care provider about whether it is safe for you to drink red wine in moderation. This means 1 glass a day for nonpregnant women and 2 glasses a day for men. A glass of wine equals 5 oz (150 mL). This information is not intended to replace advice given to you by your health care provider. Make sure you discuss any questions you have with your health care provider. Document Released: 09/15/2015 Document Revised: 10/18/2015 Document Reviewed: 09/15/2015 Elsevier Interactive Patient Education  2017 [employer].

## 2023-08-06 NOTE — Progress Notes (Signed)
 Assessment/Plan:   Dementia likely due to Alzheimer's Disease     Shawn [name] is a very pleasant 89 y.o. RH male with a history of hyperlipidemia, PE and DVT, Factor V Leiden deficiency on Eliquis  and a diagnosis of dementia likely due to Alzheimer's disease per latest neuropsych evaluation February 2025 seen today in follow up for memory loss. Patient is currently on donepezil  10 mg daily and memantine  5 mg twice daily, tolerating well.SABRAHe still able to participate in most ADLs, he no longer drives. Mood controlled with Zoloft .   Follow up in 6  months. Continue donepezil  10 mg daily and increase memantine  to 10 mg twice daily, side effects discussed   Follow-up with hematology on factor V Leyden (for history of PE, DVT and TIA) Recommend good control of her cardiovascular risk factors Continue to control mood as per PCP, he is on Zoloft    Subjective:    This patient is accompanied in the office by his wife and his daughter who supplements the history.  Previous records as well as any outside records available were reviewed prior to todays visit. Patient was last seen on 02/19/2023, with MMSE 21/30.    Any changes in memory since last visit? It is the same-Wife says.  He short-term memory is worse than the long-term memory.  He enjoys jigsaw puzzles, does not like reading. repeats oneself?  Endorsed, but not frequently Disoriented when walking into a room? Denies    Leaving objects?  May misplace things but not in unusual places   Wandering behavior?  Denies.   Any personality changes since last visit?  Denies.   Any worsening depression?:  Denies.  He has a history of depression attributed to the current diagnosis, inability to drive and friends dying.  He declined psychotherapy in the past. Hallucinations or paranoia?  He has some episodes of sundowning, but not frequently  Seizures? denies    Any sleep changes?  Sleeps well, but does have vivid dreams and talks of  his sleep, REM behavior, denies sleepwalking   Sleep apnea?   Denies.   Any hygiene concerns? Denies.  Independent of bathing and dressing?  Endorsed  Does the patient needs help with medications? Wife is in charge   Who is in charge of the finances?  Wife is in charge, he participates      Any changes in appetite?  Decreased appetite, favors ice cream and other sweets . Uses partials and they are too smooth, to see dentist soon   Patient have trouble swallowing? Yes, with large pills  Does the patient cook? No Any headaches?  Denies   Any vision changes? Denies  Chronic back pain  denies.   Ambulates with difficulty? Denies.  Wife reports that he may walk slower than prior. No cemented feet  Recent falls or head injuries? 1 month ago going to the patio and lost the step, no LOC. No head injury.     Unilateral weakness, numbness or tingling? Denies.   Any tremors?  He has known minimal left hand tremor, without any other parkinsonian signs reported  Any anosmia?  Denies   Any incontinence of urine?  Endorsed, wears diapers   Any bowel dysfunction?   Denies      Patient lives with his wife  Does the patient drive? No longer drives     Neuropsych evaluation 03/26/23, [name] Briefly, results suggested severe impairment surrounding all aspects of learning and memory. Additional impairments were exhibited across cognitive  flexibility and semantic fluency, while further performance variability was exhibited across processing speed, confrontation naming, and visuospatial abilities. Relative to his previous evaluation in January 2022, mild decline was exhibited across confrontation naming, as well as both delayed retrieval and recognition/consolidation aspects of memory. Outside of these areas, performances were largely stable across the two evaluations.    History on Initial Assessment 04/22/2020: This is an 89 year old right-handed man with a history of hyperlipidemia, PE and DVT, Factor V  Leiden deficiency, presenting for evaluation of dementia. His wife is present to provide additional information. Records were reviewed, he underwent Neuropsychological evaluation in January 2022 with very significant cognitive impairment with profound visuospatial problems and additional low scores on measures of processing speed, executive function, and working memory. 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 Atypical Alzheimer's is also a possibility. Vascular disease may be contributing but does not well explain his higher cortical signs. MRI brain in 2017 showed moderate chronic microvascular disease and mild diffuse volume loss. Repeat MRI brain in 2022 showed progression of chronic microvascular disease, no acute changes.    He feels his memory is good, not perfect. His wife started noticing forgetfulness and word-finding difficulties a couple of years ago. His handwriting has always been terrible, a little more illegible than before. He continues to drive and denies getting lost driving. He manages his own medications and denies missing doses. They do bills together. His wife denies any difficulties following instructions or using the remote control/microwave. He is independent with dressing and bathing. No personality changes, mood is same as always, no paranoia or hallucinations.   They started noticing involuntary hand movements a few months ago. He has intermittent right arm myoclonus throughout the visit. They note he moves his fingers without realizing. Movements do not affect writing or using utensils. He has not noticed them in his legs. No falls. Sleep is good, no REM behavior  disorder. He does fall asleep when he reads during the day. No staring/unresponsive episodes, gaps in time, olfactory/gustatory hallucinations. No change in gait, he denies any stiffness/difficulty with getting out of bed.     PREVIOUS MEDICATIONS:   CURRENT MEDICATIONS:  Outpatient Encounter Medications as of 08/06/2023  Medication Sig   ascorbic acid (VITAMIN C) 500 MG tablet Take 500 mg by mouth daily with lunch.   Cholecalciferol 125 MCG (5000 UT) TABS Take 5,000 Units by mouth every Monday, Tuesday, Wednesday, Thursday, and Friday.  With lunch   cyanocobalamin  (VITAMIN B12) 1000 MCG tablet Take 1,000 mcg by mouth daily.   ELIQUIS  5 MG TABS tablet TAKE 1 TABLET BY MOUTH TWICE A DAY   ezetimibe  (ZETIA ) 10 MG tablet Take 1 tablet (10 mg total) by mouth daily.   Multiple Vitamin (MULTIVITAMIN WITH MINERALS) TABS tablet Take 1 tablet by mouth daily with lunch.   pantoprazole  (PROTONIX ) 40 MG tablet Take 1 tablet (40 mg total) by mouth daily.   saw palmetto  160 MG capsule Take 320 mg by mouth daily with lunch.   sertraline  (ZOLOFT ) 50 MG tablet Take 1 tablet (50 mg total) by mouth daily.   [DISCONTINUED] donepezil  (ARICEPT ) 10 MG tablet TAKE 1 TABLET BY MOUTH EVERY DAY   [DISCONTINUED] memantine  (NAMENDA ) 5 MG tablet Take 1 tablet (5 mg at night) for  2 weeks, then increase to 1 tablet (5 mg) twice a day   donepezil  (ARICEPT ) 10 MG tablet Take 1 tablet (10 mg total) by mouth daily.   memantine  (NAMENDA ) 10 MG tablet Take 1 tablet twice a day   No facility-administered encounter medications on file as of 08/06/2023.       02/19/2023   12:00 PM 08/16/2022    1:00 PM 02/22/2022   12:00 PM  MMSE - Mini Mental State Exam  Orientation to time 3 1 1   Orientation to Place 3 3 5   Registration 3 3 3   Attention/ Calculation 4 5 5   Recall 1 2 1   Language- name 2 objects 2 2 2   Language- repeat 1 1 1   Language- follow 3 step command 3 3 3   Language- read & follow direction 1 1 1   Write a sentence 1  1 1   Copy design 0 0 0  Total score 22 22 23       02/29/2020   11:00 AM  Montreal Cognitive Assessment   Visuospatial/ Executive (0/5) 1  Naming (0/3) 3  Attention: Read list of digits (0/2) 1  Attention: Read list of letters (0/1) 1  Attention: Serial 7 subtraction starting at 100 (0/3) 1  Language: Repeat phrase (0/2) 1  Language : Fluency (0/1) 0  Abstraction (0/2) 1  Delayed Recall (0/5) 0  Orientation (0/6) 4  Total 13  Adjusted Score (based on education) 14    Objective:     PHYSICAL EXAMINATION:    VITALS:   Vitals:   08/06/23 0854  BP: 139/77  Pulse: 75  Resp: 20  SpO2: 95%  Weight: 174 lb (78.9 kg)  Height: 5' 7 (1.702 m)    GEN:  The patient appears stated age and is in NAD. HEENT:  Normocephalic, atraumatic.   Neurological examination:  General: NAD, well-groomed, appears stated age. Orientation: The patient is alert. Oriented to person, not to place and date Cranial nerves: There is good facial symmetry. Flat affect.The speech is fluent and clear. No aphasia or dysarthria. Fund of knowledge is appropriate. Recent and remote memory are impaired. Attention and concentration are reduced. Able to name objects and repeat phrases.  Hearing is intact to conversational tone.   Sensation: Sensation is intact to light touch throughout Motor: Strength is at least antigravity x4. DTR's 2/4 in UE/LE     Movement examination: Tone: There is normal tone in the UE/LE Abnormal movements: Minimal chronic intention tremor bilaterally.  No myoclonus.  No asterixis.   Coordination:  There is no decremation with RAM's. Normal finger to nose  Gait and Station: The patient has no difficulty arising out of a deep-seated chair without the use of the hands.  Less arm swing. The patient's stride length is shorter  Gait is cautious and narrow.    Thank you for allowing us  the opportunity to participate in the care of this nice patient. Please do not hesitate to contact us  for  any questions or concerns.   Total time spent on today's visit was 25 minutes dedicated to this patient today, preparing to see patient, examining the patient, ordering tests and/or medications and counseling the patient, documenting clinical information in the EHR or other health record, independently interpreting results and communicating results to the patient/family, discussing treatment and goals, answering patient's questions and coordinating care.  Cc:  Antonio Cyndee Jamee JONELLE, DO  [facility] 08/06/2023 10:58 AM

## 2023-08-07 ENCOUNTER — Telehealth: Payer: Self-pay | Admitting: Physician Assistant

## 2023-08-07 NOTE — Telephone Encounter (Signed)
 Pt's wife called in stating a referral was sent for PT for the pt's balance. His main issue is his back pain. She talked to the PT place and they stated we needed to send an amended referral to include his back.

## 2023-08-08 NOTE — Telephone Encounter (Signed)
 I advised of Physical therapy order it was for balance and strength and not for pain. They thanked me for calling back.

## 2023-08-19 ENCOUNTER — Ambulatory Visit: Payer: Medicare HMO | Admitting: Physician Assistant

## 2023-08-22 ENCOUNTER — Ambulatory Visit: Attending: Physician Assistant | Admitting: Rehabilitation

## 2023-08-22 DIAGNOSIS — M6281 Muscle weakness (generalized): Secondary | ICD-10-CM | POA: Diagnosis not present

## 2023-08-22 DIAGNOSIS — R2681 Unsteadiness on feet: Secondary | ICD-10-CM | POA: Diagnosis not present

## 2023-08-22 DIAGNOSIS — R269 Unspecified abnormalities of gait and mobility: Secondary | ICD-10-CM | POA: Diagnosis not present

## 2023-08-22 DIAGNOSIS — R296 Repeated falls: Secondary | ICD-10-CM | POA: Insufficient documentation

## 2023-08-22 NOTE — Therapy (Signed)
 OUTPATIENT PHYSICAL THERAPY NEURO EVALUATION   Patient Name: Shawn [name] MRN: [id number] DOB:December 06, 1934, 89 y.o., male Today's Date: 08/22/2023   END OF SESSION:  PT End of Session - 08/22/23 1028     Visit Number 1    PT Start Time 1025    PT Stop Time 1105    PT Time Calculation (min) 40 min          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   Generalized anxiety disorder 10/30/2021   Hemorrhoids    Hepatic cyst 11/19/2007   High serum vitamin D  01/12/2020   History of blood clots    History of colon polyps 11/02/2011   hyperplastic   Hyperlipidemia    Impacted cerumen 07/29/2009   Major depressive disorder 10/30/2021   Osteoarthritis    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   Otitis externa 07/07/2021   Overweight 10/05/2010   Pain of left thumb 02/20/2016   Pulmonary embolism (HCC) 11/20/2009   Right groin pain 11/16/2021   Thrombocytopenia 12/01/2009   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   Past Surgical History:  Procedure Laterality Date   APPENDECTOMY     CATARACT EXTRACTION  02/09/2007   right eye   INGUINAL HERNIA REPAIR     INGUINAL HERNIA REPAIR Right 01/05/2022   Procedure: RIGHT HERNIA REPAIR RECURRENT INGUINAL WITH THREE BY SIX POLYPROPYLENE MESH;  Surgeon: Sebastian Moles, MD;  Location: [facility] OR;  Service: General;  Laterality: Right;  GEN AND TAP BLOCK   TONSILLECTOMY     VARICOSE VEIN SURGERY     VASECTOMY     Patient Active Problem List    Diagnosis Date Noted   Family history of early CAD 04/26/2023   Essential hypertension 04/26/2023   Major depressive disorder 10/30/2021   Generalized anxiety disorder 10/30/2021   Ataxia of right upper extremity 08/09/2021   Otitis externa 07/07/2021   Dementia due to Alzheimer's disease 03/18/2020   High serum vitamin D  01/12/2020   Pain of left thumb 02/20/2016   History of colon polyps 11/02/2011   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   Overweight 10/05/2010   Backache 04/21/2010   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   Thrombocytopenia 12/01/2009   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   Aortic valve disorder 11/19/2007   Benign neoplasm of skin 08/11/2007   Hyperlipidemia 09/04/2006   Osteoarthritis 09/04/2006   Dysphagia 09/04/2006    PCP: Antonio Cyndee Jamee JONELLE, DO   REFERRING PROVIDER: Dina Camie BRAVO, PA-C   REFERRING DIAG: R26.9 (ICD-10-CM) - Gait abnormality  THERAPY DIAG:  Gait abnormality  Muscle weakness (generalized)  Unsteadiness on feet  Repeated falls  RATIONALE FOR EVALUATION AND TREATMENT: Rehabilitation  ONSET DATE: worsening over the last year   SUBJECTIVE:  SUBJECTIVE STATEMENT: Patient is referred to PT from his neurologist for gait abnormality and falls.  Patient reports he has had some falls, namely one in which he fell down the steps.   He denies any injuries.  He is a poor historian due to dementia, and his spouse does not come back into the clinic for this evaluation.  She states she prefers to wait in the waiting room.  Patient states his wife is the one making him come to therapy.   He thinks his strength is fine, but admits that he does have a balance problem and he is agreeable to receive PT for it.  He comes  into the clinic with no device and states he does not use a cane or walker.  He states he has LBP, but that it is chronic.  He says his main issue is his balance and that he wants to walk better and not fall.    Pt accompanied by: significant other  PAIN: Are you having pain? Yes: NPRS scale: 4-5/10 on [employer] faces Pain location: low back Pain description: aching, constant Aggravating factors: lyng down Relieving factors: standing and bending  PERTINENT HISTORY:  dementia, blood clotting disorder (Factor V), h/o DVT and PE, ataxia, LBP, moderate aortic sclerosis  PRECAUTIONS: None  RED FLAGS: None  WEIGHT BEARING RESTRICTIONS: No  FALLS:  Has patient fallen in last 6 months? Yes. 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  PLOF: Independent with gait  PATIENT GOALS: to have better balance   OBJECTIVE: (objective measures completed at initial evaluation unless otherwise dated)  COGNITION: Overall cognitive status: Impaired, History of cognitive impairments - at baseline, and No family/caregiver present to determine baseline cognitive functioning   SENSATION: WFL  COORDINATION: Past pointing and impaired heel to shin BUE and BLE  EDEMA:   None noted  POSTURE:  Mild to moderate thoracic kyphosis  MUSCLE LENGTH: Hamstrings: moderate tightness Ble  LOWER EXTREMITY ROM:     Active  Right eval Left eval  Hip flexion    Hip extension    Hip abduction    Hip adduction    Hip internal rotation    Hip external rotation    Knee flexion    Knee extension -8 to 10 degrees -3 to -4  Ankle dorsiflexion 3-4 0  Ankle plantarflexion    Ankle inversion    Ankle eversion     (Blank rows = not tested)  LOWER EXTREMITY MMT:    MMT Right eval Left eval  Hip flexion 4+ 4+  Hip extension    Hip abduction 4- 4-  Hip  adduction    Hip internal rotation 4 4  Hip external rotation 4 4  Knee flexion 5 5  Knee extension 5 5  Ankle dorsiflexion 4+ 4+  Ankle plantarflexion    Ankle inversion    Ankle eversion    (Blank rows = not tested)  TRANSFERS:     Prefers to use hands for all sit to stand, but can do it without       GAIT: Distance walked: into clinic x 200' Assistive device utilized: None Level of assistance: CGA Gait pattern: decreased arm swing- Right, decreased arm swing- Left, decreased step length- Right, and decreased step length- Left Comments: Slow, shuffling gait on flexed knees with significant thoracic kyphosis, posterior pelvic tilt, moderately unsteady and needs  assistance with no device for safety and balance   FUNCTIONAL TESTS:  5xSTS = 18.19 TUG = 15.25 sec BERG =40/56   PATIENT SURVEYS:  ABC Scale is 510/1600 for 31.9% confidence in balance   TODAY'S TREATMENT:   SELF CARE: Provided education to reduce fall risk and to promote safe home environment. Discussed the following home safety modifications:  pt. Doesn't have a shower stool/chair or removable shower head or long handle bath brush/liquid soap.   He is advised to obtain these for safety in shower.  He does not have a grab bar at the toilet and is advised to have one installed.   Advised to remove throw rugs, install night lights  Initial HEP instruction   PATIENT EDUCATION:  Education details: PT eval findings, anticipated POC, and initial HEP  Person educated: Patient Education method: Explanation, Demonstration, Verbal cues, Tactile cues, and Handouts Education comprehension: verbalized understanding, verbal cues required, tactile cues required, and needs further education  HOME EXERCISE PROGRAM: Access Code: [email or web address] URL: [email or web address] Date: 08/22/2023 Prepared by: Garnette Montclair  Exercises - Heel Toe Raises with Counter Support  - 1 x daily - 7 x weekly - 3 sets - 10 reps -  Toe Raises with Counter Support  - 1 x daily - 7 x weekly - 3 sets - 10 reps - Tandem Stance with Support  - 1 x daily - 7 x weekly - 3 sets - 10 reps - Standing Lumbar Extension with Counter  - 1 x daily - 7 x weekly - 3 sets - 10 reps   ASSESSMENT:  CLINICAL IMPRESSION: Shawn [name] is a 89 y.o. male who was referred to physical therapy for evaluation and treatment for gait abnormality.    Patient presents with physical impairments of impaired activity tolerance, impaired standing balance, impaired ambulation, and decreased safety awareness impacting safe and independent functional mobility.  Examination revealed patient is at risk for falls and functional decline as evidenced by the following objective test measures: TUG of 15.25 sec (>13.5 sec indicates increased risk for falls), and 5xSTS of 18.29 sec (>15 sec indicates increased risk for falls and decreased BLE power).  ABC scale score of 39.1% indicates a decreased level of physical functioning, and BERG score of 40/56 indicating a high fall risk and need for using a walker all times.  Oluwadamilola will benefit from skilled PT to address above deficits to improve mobility and activity tolerance to help reach the maximal level of functional independence and mobility. Patient demonstrates understanding of this POC and is in agreement with this plan.   OBJECTIVE IMPAIRMENTS: Abnormal gait, decreased balance, decreased cognition, decreased coordination, decreased mobility, difficulty walking, decreased strength, and decreased safety awareness.   ACTIVITY LIMITATIONS: carrying, lifting, bending, standing, squatting, stairs, bathing, dressing, and locomotion level  PARTICIPATION LIMITATIONS: cleaning, laundry, shopping, and community activity  PERSONAL FACTORS: Age, comorbities:dementia, blood clotting disorder (Factor V), h/o DVT and PE, ataxia, LBP, moderate aortic sclerosi  are also affecting patient's functional outcome.   REHAB  POTENTIAL: Good  CLINICAL DECISION MAKING: Evolving/moderate complexity  EVALUATION COMPLEXITY: Moderate   GOALS: Goals reviewed with patient? Yes  SHORT TERM GOALS: Target date: 09/19/2023  Patient will be independent with initial HEP to improve outcomes and carryover.  Baseline: 100% PT assist required for completion Goal status: INITIAL  2.  Patient will be educated on strategies to decrease risk of falls.  Baseline: 100% PT assist required for recall and spouse needs instruction since didn't come into clinic  Goal status: INITIAL  3.  Patient will demonstrate decreased TUG time to </= 13 sec to decrease risk for falls with transitional mobility. Baseline: 15.25 Goal status: INITIAL  LONG TERM GOALS: Target date: 10/17/2023  Patient will be independent with advanced/ongoing HEP to facilitate ability to maintain/progress functional gains from skilled physical therapy services. Baseline: no advanced HEP yet Goal status: INITIAL  2.  Patient will be able to ambulate 600' with or w/o LRAD on variable surfaces with good safety to access community.  Baseline: requires CGA to ambulate safely on level surfaces Goal status: INITIAL  3.  Patient will be able to step up/down curb safely with LRAD for safety with community ambulation.  Baseline: TBD Goal status: INITIAL   4.  Patient will demonstrate improved B hip strength to >/= 4+/5 for improved stability and ease of mobility . Baseline: Refer to above LE MMT table Goal status: INITIAL  5.  Patient will improve 5xSTS time to </= 12 seconds for improved efficiency and safety with transfers. Baseline: 18.29 Goal status: INITIAL   6.  Patient will demonstrate gait speed of >/= 1.8 ft/sec (0.55 m/s) to be a safe limited community ambulator with decreased risk for recurrent falls.  Baseline: TBD Goal status: INITIAL  7.  Patient will improve Berg score to >/= 48/56 to improve safety and stability with ADLs in standing and reduce  risk for falls. (MCID= 8 points)  Baseline: 40 Goal status: INITIAL  10.  Patient will report >/= 60% on ABC scale (MCID = 19%) to demonstrate improved balance confidence with functional mobility and gait. Baseline: 39.1  Goal status: INITIAL   PLAN:  PT FREQUENCY: 1-2x/week  PT DURATION: 8 weeks  PLANNED INTERVENTIONS: [id number]- PT Re-evaluation, 97750- Physical Performance Testing, 97110-Therapeutic exercises, 97530- Therapeutic activity, [id number]- Neuromuscular re-education, 97535- Self Care, [id number]- Manual therapy, [phone]- Gait training, Patient/Family education, Balance training, Stair training, and Taping  PLAN FOR NEXT SESSION: Review HEP; add hamstring stretching, hip strengthening, static and dynamic balance exercises   Girlie Veltri, PT 08/22/2023, 8:14 PM

## 2023-08-27 ENCOUNTER — Ambulatory Visit

## 2023-08-27 DIAGNOSIS — M6281 Muscle weakness (generalized): Secondary | ICD-10-CM | POA: Diagnosis not present

## 2023-08-27 DIAGNOSIS — R2681 Unsteadiness on feet: Secondary | ICD-10-CM | POA: Diagnosis not present

## 2023-08-27 DIAGNOSIS — R296 Repeated falls: Secondary | ICD-10-CM

## 2023-08-27 DIAGNOSIS — R269 Unspecified abnormalities of gait and mobility: Secondary | ICD-10-CM | POA: Diagnosis not present

## 2023-08-27 NOTE — Therapy (Signed)
 OUTPATIENT PHYSICAL THERAPY NEURO TREATMENT   Patient Name: Shawn [name] MRN: [id number] DOB:30-Aug-1934, 89 y.o., male Today's Date: 08/27/2023   END OF SESSION:  PT End of Session - 08/27/23 1023     Visit Number 2    PT Start Time 1016    PT Stop Time 1059    PT Time Calculation (min) 43 min           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   Generalized anxiety disorder 10/30/2021   Hemorrhoids    Hepatic cyst 11/19/2007   High serum vitamin D  01/12/2020   History of blood clots    History of colon polyps 11/02/2011   hyperplastic   Hyperlipidemia    Impacted cerumen 07/29/2009   Major depressive disorder 10/30/2021   Osteoarthritis    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   Otitis externa 07/07/2021   Overweight 10/05/2010   Pain of left thumb 02/20/2016   Pulmonary embolism (HCC) 11/20/2009   Right groin pain 11/16/2021   Thrombocytopenia 12/01/2009   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   Past Surgical History:  Procedure Laterality Date   APPENDECTOMY     CATARACT EXTRACTION  02/09/2007   right eye   INGUINAL HERNIA REPAIR     INGUINAL HERNIA REPAIR Right 01/05/2022   Procedure: RIGHT HERNIA REPAIR RECURRENT INGUINAL WITH THREE BY SIX POLYPROPYLENE MESH;  Surgeon: Sebastian Moles, MD;  Location: [facility] OR;  Service: General;  Laterality: Right;  GEN AND TAP BLOCK   TONSILLECTOMY     VARICOSE VEIN SURGERY     VASECTOMY     Patient Active Problem List    Diagnosis Date Noted   Family history of early CAD 04/26/2023   Essential hypertension 04/26/2023   Major depressive disorder 10/30/2021   Generalized anxiety disorder 10/30/2021   Ataxia of right upper extremity 08/09/2021   Otitis externa 07/07/2021   Dementia due to Alzheimer's disease 03/18/2020   High serum vitamin D  01/12/2020   Pain of left thumb 02/20/2016   History of colon polyps 11/02/2011   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   Overweight 10/05/2010   Backache 04/21/2010   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   Thrombocytopenia 12/01/2009   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   Aortic valve disorder 11/19/2007   Benign neoplasm of skin 08/11/2007   Hyperlipidemia 09/04/2006   Osteoarthritis 09/04/2006   Dysphagia 09/04/2006    PCP: Antonio Cyndee Jamee JONELLE, DO   REFERRING PROVIDER: Dina Camie BRAVO, PA-C   REFERRING DIAG: R26.9 (ICD-10-CM) - Gait abnormality  THERAPY DIAG:  Gait abnormality  Muscle weakness (generalized)  Unsteadiness on feet  Repeated falls  RATIONALE FOR EVALUATION AND TREATMENT: Rehabilitation  ONSET DATE: worsening over the last year   SUBJECTIVE:  SUBJECTIVE STATEMENT:  Pt denies recent falls, or changes in status, no LBP right now  Pt accompanied by: significant other  PAIN: Are you having pain? Yes: NPRS scale: 0/10 on [employer] faces Pain location: low back Pain description: aching, constant Aggravating factors: lyng down Relieving factors: standing and bending  PERTINENT HISTORY:  dementia, blood clotting disorder (Factor V), h/o DVT and PE, ataxia, LBP, moderate aortic sclerosis  PRECAUTIONS: None  RED FLAGS: None  WEIGHT BEARING RESTRICTIONS: No  FALLS:  Has patient fallen in last 6  months? Yes. 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  PLOF: Independent with gait  PATIENT GOALS: to have better balance   OBJECTIVE: (objective measures completed at initial evaluation unless otherwise dated)  COGNITION: Overall cognitive status: Impaired, History of cognitive impairments - at baseline, and No family/caregiver present to determine baseline cognitive functioning   SENSATION: WFL  COORDINATION: Past pointing and impaired heel to shin BUE and BLE  EDEMA:   None noted  POSTURE:  Mild to moderate thoracic kyphosis  MUSCLE LENGTH: Hamstrings: moderate tightness Ble  LOWER EXTREMITY ROM:     Active  Right eval Left eval  Hip flexion    Hip extension    Hip abduction    Hip adduction    Hip internal rotation    Hip external rotation    Knee flexion    Knee extension -8 to 10 degrees -3 to -4  Ankle dorsiflexion 3-4 0  Ankle plantarflexion    Ankle inversion    Ankle eversion     (Blank rows = not tested)  LOWER EXTREMITY MMT:    MMT Right eval Left eval  Hip flexion 4+ 4+  Hip extension    Hip abduction 4- 4-  Hip adduction    Hip internal rotation 4 4  Hip external rotation 4 4  Knee flexion 5 5  Knee extension 5 5  Ankle dorsiflexion 4+ 4+  Ankle plantarflexion    Ankle inversion    Ankle eversion    (Blank rows = not tested)  TRANSFERS:     Prefers to use hands for all sit to stand, but can do it without       GAIT: Distance walked: into clinic x 200' Assistive device utilized: None Level of assistance: CGA Gait pattern: decreased arm swing- Right, decreased arm swing- Left, decreased step length- Right, and decreased step length- Left Comments: Slow, shuffling gait on flexed knees with significant thoracic kyphosis, posterior pelvic tilt, moderately unsteady and needs assistance  with no device for safety and balance   FUNCTIONAL TESTS:  5xSTS = 18.19 TUG = 15.25 sec BERG =40/56   PATIENT SURVEYS:  ABC Scale is 510/1600 for 31.9% confidence in balance   TODAY'S TREATMENT:  08/27/23 THERAPEUTIC EXERCISE: To improve strength, endurance, ROM, and flexibility.  Nustep L3x96min  Seated hip hinge HS stretch 2 x 30 BLE Seated ball squeeze 2x10 Seated hip ABD RTB 10x3 unilateral Seated marching RTB x 10 BLE  NEUROMUSCULAR RE-EDUCATION:  Heel/toe lifts at counter 2 x 10 Tandem stance x 30 BLE Standing lumbar extension counter Standing hip abduction x 10 BLE Standing hip extension x 10 BLE Standing balance narrow BOS:  Head turns x5  Trunk rotation x 5  Retro steps x 5    Eval: SELF CARE: Provided education to reduce fall risk and to promote safe  home environment. Discussed the following home safety modifications:  pt. Doesn't have a shower stool/chair or removable shower head or long handle bath brush/liquid soap.   He is advised to obtain these for safety in shower.  He does not have a grab bar at the toilet and is advised to have one installed.   Advised to remove throw rugs, install night lights  Initial HEP instruction   PATIENT EDUCATION:  Education details: PT eval findings, anticipated POC, and initial HEP  Person educated: Patient Education method: Explanation, Demonstration, Verbal cues, Tactile cues, and Handouts Education comprehension: verbalized understanding, verbal cues required, tactile cues required, and needs further education  HOME EXERCISE PROGRAM: Access Code: [email or web address] URL: [email or web address] Date: 08/22/2023 Prepared by: Garnette Montclair  Exercises - Heel Toe Raises with Counter Support  - 1 x daily - 7 x weekly - 3 sets - 10 reps - Toe Raises with Counter Support  - 1 x daily - 7 x weekly - 3 sets - 10 reps - Tandem Stance with Support  - 1 x daily - 7 x weekly - 3 sets - 10 reps - Standing Lumbar  Extension with Counter  - 1 x daily - 7 x weekly - 3 sets - 10 reps   ASSESSMENT:  CLINICAL IMPRESSION: Pt responded well to the progression of exercises. He requires cues throughout session for upright posture. CGA required with balance activities as he was more unsteady with retro step exercise. He is very weak in hip abd and ext in standing.  Eval: Shawn [name] is a 89 y.o. male who was referred to physical therapy for evaluation and treatment for gait abnormality.    Patient presents with physical impairments of impaired activity tolerance, impaired standing balance, impaired ambulation, and decreased safety awareness impacting safe and independent functional mobility.  Examination revealed patient is at risk for falls and functional decline as evidenced by the following objective test measures: TUG of 15.25 sec (>13.5 sec indicates increased risk for falls), and 5xSTS of 18.29 sec (>15 sec indicates increased risk for falls and decreased BLE power).  ABC scale score of 39.1% indicates a decreased level of physical functioning, and BERG score of 40/56 indicating a high fall risk and need for using a walker all times.  Thaddeaus will benefit from skilled PT to address above deficits to improve mobility and activity tolerance to help reach the maximal level of functional independence and mobility. Patient demonstrates understanding of this POC and is in agreement with this plan.   OBJECTIVE IMPAIRMENTS: Abnormal gait, decreased balance, decreased cognition, decreased coordination, decreased mobility, difficulty walking, decreased strength, and decreased safety awareness.   ACTIVITY LIMITATIONS: carrying, lifting, bending, standing, squatting, stairs, bathing, dressing, and locomotion level  PARTICIPATION LIMITATIONS: cleaning, laundry, shopping, and community activity  PERSONAL FACTORS: Age, comorbities:dementia, blood clotting disorder (Factor V), h/o DVT and PE, ataxia, LBP, moderate  aortic sclerosi  are also affecting patient's functional outcome.   REHAB POTENTIAL: Good  CLINICAL DECISION MAKING: Evolving/moderate complexity  EVALUATION COMPLEXITY: Moderate   GOALS: Goals reviewed with patient? Yes  SHORT TERM GOALS: Target date: 09/19/2023  Patient will be independent with initial HEP to improve outcomes and carryover.  Baseline: 100% PT assist required for completion Goal status: INITIAL  2.  Patient will be educated on strategies to decrease risk of falls.  Baseline: 100% PT assist required for recall and spouse needs instruction since didn't come into clinic Goal status: INITIAL  3.  Patient will demonstrate decreased TUG  time to </= 13 sec to decrease risk for falls with transitional mobility. Baseline: 15.25 Goal status: INITIAL  LONG TERM GOALS: Target date: 10/17/2023  Patient will be independent with advanced/ongoing HEP to facilitate ability to maintain/progress functional gains from skilled physical therapy services. Baseline: no advanced HEP yet Goal status: INITIAL  2.  Patient will be able to ambulate 600' with or w/o LRAD on variable surfaces with good safety to access community.  Baseline: requires CGA to ambulate safely on level surfaces Goal status: INITIAL  3.  Patient will be able to step up/down curb safely with LRAD for safety with community ambulation.  Baseline: TBD Goal status: INITIAL   4.  Patient will demonstrate improved B hip strength to >/= 4+/5 for improved stability and ease of mobility . Baseline: Refer to above LE MMT table Goal status: INITIAL  5.  Patient will improve 5xSTS time to </= 12 seconds for improved efficiency and safety with transfers. Baseline: 18.29 Goal status: INITIAL   6.  Patient will demonstrate gait speed of >/= 1.8 ft/sec (0.55 m/s) to be a safe limited community ambulator with decreased risk for recurrent falls.  Baseline: TBD Goal status: INITIAL  7.  Patient will improve Berg score to  >/= 48/56 to improve safety and stability with ADLs in standing and reduce risk for falls. (MCID= 8 points)  Baseline: 40 Goal status: INITIAL  10.  Patient will report >/= 60% on ABC scale (MCID = 19%) to demonstrate improved balance confidence with functional mobility and gait. Baseline: 39.1  Goal status: INITIAL   PLAN:  PT FREQUENCY: 1-2x/week  PT DURATION: 8 weeks  PLANNED INTERVENTIONS: [id number]- PT Re-evaluation, 97750- Physical Performance Testing, 97110-Therapeutic exercises, 97530- Therapeutic activity, [id number]- Neuromuscular re-education, 97535- Self Care, [id number]- Manual therapy, [phone]- Gait training, Patient/Family education, Balance training, Stair training, and Taping  PLAN FOR NEXT SESSION: Review HEP; add hamstring stretching, hip strengthening, static and dynamic balance exercises   Chaka Jefferys L Roddy Bellamy, PTA 08/27/2023, 11:09 AM

## 2023-08-30 ENCOUNTER — Ambulatory Visit: Admitting: Rehabilitation

## 2023-08-30 ENCOUNTER — Encounter: Payer: Self-pay | Admitting: Rehabilitation

## 2023-08-30 DIAGNOSIS — R269 Unspecified abnormalities of gait and mobility: Secondary | ICD-10-CM | POA: Diagnosis not present

## 2023-08-30 DIAGNOSIS — R296 Repeated falls: Secondary | ICD-10-CM | POA: Diagnosis not present

## 2023-08-30 DIAGNOSIS — M6281 Muscle weakness (generalized): Secondary | ICD-10-CM | POA: Diagnosis not present

## 2023-08-30 DIAGNOSIS — R2681 Unsteadiness on feet: Secondary | ICD-10-CM

## 2023-08-30 NOTE — Therapy (Signed)
 OUTPATIENT PHYSICAL THERAPY NEURO TREATMENT   Patient Name: Shawn [name] MRN: [id number] DOB:May 03, 1934, 89 y.o., male Today's Date: 08/30/2023   END OF SESSION:  PT End of Session - 08/30/23 0923     Visit Number 3    PT Start Time 0924    Equipment Utilized During Treatment Gait belt    Activity Tolerance Patient tolerated treatment well;No increased pain           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   Generalized anxiety disorder 10/30/2021   Hemorrhoids    Hepatic cyst 11/19/2007   High serum vitamin D  01/12/2020   History of blood clots    History of colon polyps 11/02/2011   hyperplastic   Hyperlipidemia    Impacted cerumen 07/29/2009   Major depressive disorder 10/30/2021   Osteoarthritis    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   Otitis externa 07/07/2021   Overweight 10/05/2010   Pain of left thumb 02/20/2016   Pulmonary embolism (HCC) 11/20/2009   Right groin pain 11/16/2021   Thrombocytopenia 12/01/2009   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   Past Surgical History:  Procedure Laterality Date   APPENDECTOMY     CATARACT EXTRACTION  02/09/2007   right eye   INGUINAL HERNIA REPAIR     INGUINAL HERNIA REPAIR Right 01/05/2022   Procedure: RIGHT HERNIA REPAIR RECURRENT INGUINAL WITH THREE BY SIX POLYPROPYLENE MESH;  Surgeon: Sebastian Moles, MD;  Location: [facility] OR;  Service: General;  Laterality: Right;  GEN AND TAP BLOCK   TONSILLECTOMY     VARICOSE VEIN  SURGERY     VASECTOMY     Patient Active Problem List   Diagnosis Date Noted   Family history of early CAD 04/26/2023   Essential hypertension 04/26/2023   Major depressive disorder 10/30/2021   Generalized anxiety disorder 10/30/2021   Ataxia of right upper extremity 08/09/2021   Otitis externa 07/07/2021   Dementia due to Alzheimer's disease 03/18/2020   High serum vitamin D  01/12/2020   Pain of left thumb 02/20/2016   History of colon polyps 11/02/2011   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  RATIONALE FOR EVALUATION AND TREATMENT: Rehabilitation  ONSET DATE: worsening over the last year   SUBJECTIVE:  SUBJECTIVE STATEMENT: Patient reports feels fine.  States his low back is hurting some today.  He is ready for therapy and willing to participate.     Pt accompanied by: significant other  PAIN: Are you having pain? Yes: NPRS scale: 0/10 on [employer] faces Pain location: low back Pain description: aching, constant Aggravating factors: lyng down Relieving factors: standing and bending  PERTINENT HISTORY:  dementia, blood clotting disorder (Factor V), h/o DVT and PE, ataxia, LBP, moderate aortic sclerosis  PRECAUTIONS: None  RED FLAGS: None  WEIGHT BEARING  RESTRICTIONS: No  FALLS:  Has patient fallen in last 6 months? Yes. 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  PLOF: Independent with gait  PATIENT GOALS: to have better balance   OBJECTIVE: (objective measures completed at initial evaluation unless otherwise dated)  COGNITION: Overall cognitive status: Impaired, History of cognitive impairments - at baseline, and No family/caregiver present to determine baseline cognitive functioning   SENSATION: WFL  COORDINATION: Past pointing and impaired heel to shin BUE and BLE  EDEMA:   None noted  POSTURE:  Mild to moderate thoracic kyphosis  MUSCLE LENGTH: Hamstrings: moderate tightness Ble  LOWER EXTREMITY ROM:     Active  Right eval Left eval  Hip flexion    Hip extension    Hip abduction    Hip adduction    Hip internal rotation    Hip external rotation    Knee flexion    Knee extension -8 to 10 degrees -3 to -4  Ankle dorsiflexion 3-4 0  Ankle plantarflexion    Ankle inversion    Ankle eversion     (Blank rows = not tested)  LOWER EXTREMITY MMT:    MMT Right eval Left eval  Hip flexion 4+ 4+  Hip extension    Hip abduction 4- 4-  Hip adduction    Hip internal rotation 4 4  Hip external rotation 4 4  Knee flexion 5 5  Knee extension 5 5  Ankle dorsiflexion 4+ 4+  Ankle plantarflexion    Ankle inversion    Ankle eversion    (Blank rows = not tested)  TRANSFERS:     Prefers to use hands for all sit to stand, but can do it without       GAIT: Distance walked: into clinic x 200' Assistive device utilized: None Level of assistance: CGA Gait pattern: decreased arm swing- Right, decreased arm swing- Left, decreased step length- Right, and decreased step length- Left Comments: Slow, shuffling gait on flexed knees with significant thoracic kyphosis,  posterior pelvic tilt, moderately unsteady and needs assistance with no device for safety and balance   FUNCTIONAL TESTS:  5xSTS = 18.19 TUG = 15.25 sec BERG =40/56   PATIENT SURVEYS:  ABC Scale is 510/1600 for 31.9% confidence in balance   TODAY'S TREATMENT:  08/30/23 THERAPEUTIC EXERCISE: To improve ROM.  Demonstration, verbal and tactile cues throughout for technique. NuStep  L5 x 5' BUE/BLE  THERAPEUTIC ACTIVITIES: To improve functional performance.  Demonstration, verbal and tactile cues throughout for technique. Seated hamstring stretch x 1' x 2 BLE Seated hip flexor stretch x 1' x 2 BLE Standing at counter back extension x 10 Pelvic rotation x 10 CW; x 10 CCW with min assist Heel/toe lifts at counter 2 x 10 no UE support Squats x 2/10 BLE no support Tandem stance x 30  BLE Standing lumbar extension counter x 10 Standing hip abduction x 10 BLE Standing hip extension x 10 BLE  NEUROMUSCULAR RE-EDUCATION:  Supine Power LTR x 5 bilaterally Supine Power Twist x 5 bilaterally SL R Power Twist x 10 Seated Power Up x 10  Seated Power Rock x 10 BUE Seated Power Twist x 10 BUE  08/27/23 THERAPEUTIC EXERCISE: To improve strength, endurance, ROM, and flexibility.  Nustep L3x62min  Seated hip hinge HS stretch 2 x 30 BLE Seated ball squeeze 2x10 Seated hip ABD RTB 10x3 unilateral Seated marching RTB x 10 BLE Tandem stance x 1' x 2 BLE Tandem gait F/B x 3 laps at counter with 1UE to no support Braiding at counter x 3 laps with intermittent support Resisted gait F/b RTB with intermittent 1UE support  NEUROMUSCULAR RE-EDUCATION:  Heel/toe lifts at counter 2 x 10 Tandem stance x 30 BLE Standing lumbar extension counter Standing hip abduction x 10 BLE Standing hip extension x 10 BLE Standing balance narrow BOS:  Head turns x5  Trunk rotation x 5  Retro steps x 5    Eval: SELF CARE: Provided education to reduce fall risk and to promote safe home  environment. Discussed the following home safety modifications:  pt. Doesn't have a shower stool/chair or removable shower head or long handle bath brush/liquid soap.   He is advised to obtain these for safety in shower.  He does not have a grab bar at the toilet and is advised to have one installed.   Advised to remove throw rugs, install night lights  Initial HEP instruction   PATIENT EDUCATION:  Education details: PT eval findings, anticipated POC, and initial HEP  Person educated: Patient Education method: Explanation, Demonstration, Verbal cues, Tactile cues, and Handouts Education comprehension: verbalized understanding, verbal cues required, tactile cues required, and needs further education  HOME EXERCISE PROGRAM: Access Code: [email or web address] URL: [email or web address] Date: 08/22/2023 Prepared by: Garnette Montclair  Exercises - Heel Toe Raises with Counter Support  - 1 x daily - 7 x weekly - 3 sets - 10 reps - Toe Raises with Counter Support  - 1 x daily - 7 x weekly - 3 sets - 10 reps - Tandem Stance with Support  - 1 x daily - 7 x weekly - 3 sets - 10 reps - Standing Lumbar Extension with Counter  - 1 x daily - 7 x weekly - 3 sets - 10 reps   ASSESSMENT:  CLINICAL IMPRESSION: Patient is working well with PT.  Needs a lot of stretching for poor flexibility and then strengthening in newly available ROM for balance.   Added some Parkinson's Power Moves to try to break up Alzheimer's related rigidity and patient needs constant manual assistance with breathing cues to complete these due to multi step nature of the moves.     Eval: Shawn [name] is a 89 y.o. male who was referred to physical therapy for evaluation and treatment for gait abnormality.    Patient presents with physical impairments of impaired activity tolerance, impaired standing balance, impaired ambulation, and decreased safety awareness impacting safe and independent functional mobility.   Examination revealed patient is at risk for falls and functional decline as evidenced by the following objective test measures: TUG of 15.25 sec (>13.5 sec indicates increased risk for falls), and 5xSTS of 18.29 sec (>15 sec indicates increased risk for falls and decreased BLE power).  ABC scale score of 39.1% indicates a decreased level of physical functioning, and BERG score of  40/56 indicating a high fall risk and need for using a walker all times.  Shawn [name] will benefit from skilled PT to address above deficits to improve mobility and activity tolerance to help reach the maximal level of functional independence and mobility. Patient demonstrates understanding of this POC and is in agreement with this plan.   OBJECTIVE IMPAIRMENTS: Abnormal gait, decreased balance, decreased cognition, decreased coordination, decreased mobility, difficulty walking, decreased strength, and decreased safety awareness.   ACTIVITY LIMITATIONS: carrying, lifting, bending, standing, squatting, stairs, bathing, dressing, and locomotion level  PARTICIPATION LIMITATIONS: cleaning, laundry, shopping, and community activity  PERSONAL FACTORS: Age, comorbities:dementia, blood clotting disorder (Factor V), h/o DVT and PE, ataxia, LBP, moderate aortic sclerosi  are also affecting patient's functional outcome.   REHAB POTENTIAL: Good  CLINICAL DECISION MAKING: Evolving/moderate complexity  EVALUATION COMPLEXITY: Moderate   GOALS: Goals reviewed with patient? Yes  SHORT TERM GOALS: Target date: 09/19/2023  Patient will be independent with initial HEP to improve outcomes and carryover.  Baseline: 100% PT assist required for completion Goal status: INITIAL  2.  Patient will be educated on strategies to decrease risk of falls.  Baseline: 100% PT assist required for recall and spouse needs instruction since didn't come into clinic Goal status: INITIAL  3.  Patient will demonstrate decreased TUG time to </= 13 sec to  decrease risk for falls with transitional mobility. Baseline: 15.25 Goal status: INITIAL  LONG TERM GOALS: Target date: 10/17/2023  Patient will be independent with advanced/ongoing HEP to facilitate ability to maintain/progress functional gains from skilled physical therapy services. Baseline: no advanced HEP yet Goal status: INITIAL  2.  Patient will be able to ambulate 600' with or w/o LRAD on variable surfaces with good safety to access community.  Baseline: requires CGA to ambulate safely on level surfaces Goal status: INITIAL  3.  Patient will be able to step up/down curb safely with LRAD for safety with community ambulation.  Baseline: TBD Goal status: INITIAL   4.  Patient will demonstrate improved B hip strength to >/= 4+/5 for improved stability and ease of mobility . Baseline: Refer to above LE MMT table Goal status: INITIAL  5.  Patient will improve 5xSTS time to </= 12 seconds for improved efficiency and safety with transfers. Baseline: 18.29 Goal status: INITIAL   6.  Patient will demonstrate gait speed of >/= 1.8 ft/sec (0.55 m/s) to be a safe limited community ambulator with decreased risk for recurrent falls.  Baseline: TBD Goal status: INITIAL  7.  Patient will improve Berg score to >/= 48/56 to improve safety and stability with ADLs in standing and reduce risk for falls. (MCID= 8 points)  Baseline: 40 Goal status: INITIAL  10.  Patient will report >/= 60% on ABC scale (MCID = 19%) to demonstrate improved balance confidence with functional mobility and gait. Baseline: 39.1  Goal status: INITIAL   PLAN:  PT FREQUENCY: 1-2x/week  PT DURATION: 8 weeks  PLANNED INTERVENTIONS: [id number]- PT Re-evaluation, 97750- Physical Performance Testing, 97110-Therapeutic exercises, 97530- Therapeutic activity, [id number]- Neuromuscular re-education, 97535- Self Care, [id number]- Manual therapy, [phone]- Gait training, Patient/Family education, Balance training, Stair training, and  Taping  PLAN FOR NEXT SESSION: Continue to stretch and then strengthen with dynamic balance exercise   Shawn [name], PT 08/30/2023, 9:24 AM

## 2023-09-03 ENCOUNTER — Ambulatory Visit

## 2023-09-03 DIAGNOSIS — R2681 Unsteadiness on feet: Secondary | ICD-10-CM

## 2023-09-03 DIAGNOSIS — R269 Unspecified abnormalities of gait and mobility: Secondary | ICD-10-CM | POA: Diagnosis not present

## 2023-09-03 DIAGNOSIS — M6281 Muscle weakness (generalized): Secondary | ICD-10-CM | POA: Diagnosis not present

## 2023-09-03 DIAGNOSIS — R296 Repeated falls: Secondary | ICD-10-CM

## 2023-09-03 NOTE — Therapy (Signed)
 OUTPATIENT PHYSICAL THERAPY NEURO TREATMENT   Patient Name: Shawn [name] MRN: [id number] DOB:[birth year], 89 y.o., male Today's Date: 09/03/2023   END OF SESSION:  PT End of Session - 09/03/23 0940     Visit Number 4    PT Start Time 0936    PT Stop Time 1019    PT Time Calculation (min) 43 min    Equipment Utilized During Treatment Gait belt    Activity Tolerance Patient tolerated treatment well;No increased pain           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   Generalized anxiety disorder 10/30/2021   Hemorrhoids    Hepatic cyst 11/19/2007   High serum vitamin D  01/12/2020   History of blood clots    History of colon polyps 11/02/2011   hyperplastic   Hyperlipidemia    Impacted cerumen 07/29/2009   Major depressive disorder 10/30/2021   Osteoarthritis    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   Otitis externa 07/07/2021   Overweight 10/05/2010   Pain of left thumb 02/20/2016   Pulmonary embolism (HCC) 11/20/2009   Right groin pain 11/16/2021   Thrombocytopenia 12/01/2009   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   Past Surgical History:  Procedure Laterality Date   APPENDECTOMY     CATARACT EXTRACTION  02/09/2007   right eye   INGUINAL HERNIA REPAIR     INGUINAL HERNIA REPAIR Right 01/05/2022   Procedure: RIGHT HERNIA REPAIR RECURRENT INGUINAL WITH THREE BY SIX POLYPROPYLENE MESH;  Surgeon: Sebastian Moles, MD;  Location: [facility] OR;  Service: General;  Laterality:  Right;  GEN AND TAP BLOCK   TONSILLECTOMY     VARICOSE VEIN SURGERY     VASECTOMY     Patient Active Problem List   Diagnosis Date Noted   Family history of early CAD 04/26/2023   Essential hypertension 04/26/2023   Major depressive disorder 10/30/2021   Generalized anxiety disorder 10/30/2021   Ataxia of right upper extremity 08/09/2021   Otitis externa 07/07/2021   Dementia due to Alzheimer's disease 03/18/2020   High serum vitamin D  01/12/2020   Pain of left thumb 02/20/2016   History of colon polyps 11/02/2011   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   Overweight 10/05/2010   Backache 04/21/2010   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   Thrombocytopenia 12/01/2009   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   Aortic valve disorder 11/19/2007   Benign neoplasm of skin 08/11/2007   Hyperlipidemia 09/04/2006   Osteoarthritis 09/04/2006   Dysphagia 09/04/2006    PCP: Antonio Cyndee Jamee JONELLE, DO   REFERRING PROVIDER: Dina Camie BRAVO, PA-C   REFERRING DIAG: R26.9 (ICD-10-CM) - Gait abnormality  THERAPY DIAG:  Gait abnormality  Muscle weakness (generalized)  Unsteadiness on feet  Repeated falls  RATIONALE FOR EVALUATION AND TREATMENT: Rehabilitation  ONSET DATE: worsening over the last year   SUBJECTIVE:  SUBJECTIVE STATEMENT: Patient reports some back pain this morning but took tylenol  so it's better now.  Pt accompanied by: significant other  PAIN: Are you having pain? Yes: NPRS scale: 0/10 on [employer] faces Pain location: low back Pain description: aching, constant Aggravating factors: lyng down Relieving factors: standing and bending  PERTINENT HISTORY:  dementia, blood clotting disorder (Factor V), h/o DVT and PE, ataxia, LBP,  moderate aortic sclerosis  PRECAUTIONS: None  RED FLAGS: None  WEIGHT BEARING RESTRICTIONS: No  FALLS:  Has patient fallen in last 6 months? Yes. 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  PLOF: Independent with gait  PATIENT GOALS: to have better balance   OBJECTIVE: (objective measures completed at initial evaluation unless otherwise dated)  COGNITION: Overall cognitive status: Impaired, History of cognitive impairments - at baseline, and No family/caregiver present to determine baseline cognitive functioning   SENSATION: WFL  COORDINATION: Past pointing and impaired heel to shin BUE and BLE  EDEMA:   None noted  POSTURE:  Mild to moderate thoracic kyphosis  MUSCLE LENGTH: Hamstrings: moderate tightness Ble  LOWER EXTREMITY ROM:     Active  Right eval Left eval  Hip flexion    Hip extension    Hip abduction    Hip adduction    Hip internal rotation    Hip external rotation    Knee flexion    Knee extension -8 to 10 degrees -3 to -4  Ankle dorsiflexion 3-4 0  Ankle plantarflexion    Ankle inversion    Ankle eversion     (Blank rows = not tested)  LOWER EXTREMITY MMT:    MMT Right eval Left eval  Hip flexion 4+ 4+  Hip extension    Hip abduction 4- 4-  Hip adduction    Hip internal rotation 4 4  Hip external rotation 4 4  Knee flexion 5 5  Knee extension 5 5  Ankle dorsiflexion 4+ 4+  Ankle plantarflexion    Ankle inversion    Ankle eversion    (Blank rows = not tested)  TRANSFERS:     Prefers to use hands for all sit to stand, but can do it without       GAIT: Distance walked: into clinic x 200' Assistive device utilized: None Level of assistance: CGA Gait pattern: decreased arm swing- Right, decreased arm swing- Left, decreased step length- Right, and decreased step length-  Left Comments: Slow, shuffling gait on flexed knees with significant thoracic kyphosis, posterior pelvic tilt, moderately unsteady and needs assistance with no device for safety and balance   FUNCTIONAL TESTS:  5xSTS = 18.19 TUG = 15.25 sec BERG =40/56   PATIENT SURVEYS:  ABC Scale is 510/1600 for 31.9% confidence in balance   TODAY'S TREATMENT:  09/03/23 THERAPEUTIC EXERCISE: To improve ROM.  Demonstration, verbal and tactile cues throughout for technique. Recumbent Bike L2x60min Seated hamstring stretch x 1' BLE THERAPEUTIC ACTIVITIES: To improve functional performance.  Demonstration, verbal and tactile cues throughout for technique. Squats x 2/10 BLE no support Fwd stepping and reaching x 10 BLE Stepping over pool noodle fwd and back x 15 BLE Sidestep YTB at ankles along counter 3x  Standing hip abduction YTB at ankles x 10 BLE  08/30/23 THERAPEUTIC EXERCISE: To improve ROM.  Demonstration, verbal and tactile cues throughout for technique. NuStep  L5 x 5' BUE/BLE  THERAPEUTIC ACTIVITIES:  To improve functional performance.  Demonstration, verbal and tactile cues throughout for technique. Seated hamstring stretch x 1' x 2 BLE Seated hip flexor stretch x 1' x 2 BLE Standing at counter back extension x 10 Pelvic rotation x 10 CW; x 10 CCW with min assist Heel/toe lifts at counter 2 x 10 no UE support Squats x 2/10 BLE no support Tandem stance x 30 BLE Standing lumbar extension counter x 10 Standing hip abduction x 10 BLE Standing hip extension x 10 BLE  NEUROMUSCULAR RE-EDUCATION:  Supine Power LTR x 5 bilaterally Supine Power Twist x 5 bilaterally SL R Power Twist x 10 Seated Power Up x 10  Seated Power Rock x 10 BUE Seated Power Twist x 10 BUE  08/27/23 THERAPEUTIC EXERCISE: To improve strength, endurance, ROM, and flexibility.  Nustep L3x44min  Seated hip hinge HS stretch 2 x 30 BLE Seated ball squeeze 2x10 Seated hip ABD RTB 10x3 unilateral Seated marching RTB  x 10 BLE Tandem stance x 1' x 2 BLE Tandem gait F/B x 3 laps at counter with 1UE to no support Braiding at counter x 3 laps with intermittent support Resisted gait F/b RTB with intermittent 1UE support  NEUROMUSCULAR RE-EDUCATION:  Heel/toe lifts at counter 2 x 10 Tandem stance x 30 BLE Standing lumbar extension counter Standing hip abduction x 10 BLE Standing hip extension x 10 BLE Standing balance narrow BOS:  Head turns x5  Trunk rotation x 5  Retro steps x 5    Eval: SELF CARE: Provided education to reduce fall risk and to promote safe home environment. Discussed the following home safety modifications:  pt. Doesn't have a shower stool/chair or removable shower head or long handle bath brush/liquid soap.   He is advised to obtain these for safety in shower.  He does not have a grab bar at the toilet and is advised to have one installed.   Advised to remove throw rugs, install night lights  Initial HEP instruction   PATIENT EDUCATION:  Education details: PT eval findings, anticipated POC, and initial HEP  Person educated: Patient Education method: Explanation, Demonstration, Verbal cues, Tactile cues, and Handouts Education comprehension: verbalized understanding, verbal cues required, tactile cues required, and needs further education  HOME EXERCISE PROGRAM: Access Code: [email or web address] URL: [email or web address] Date: 08/22/2023 Prepared by: Garnette Montclair  Exercises - Heel Toe Raises with Counter Support  - 1 x daily - 7 x weekly - 3 sets - 10 reps - Toe Raises with Counter Support  - 1 x daily - 7 x weekly - 3 sets - 10 reps - Tandem Stance with Support  - 1 x daily - 7 x weekly - 3 sets - 10 reps - Standing Lumbar Extension with Counter  - 1 x daily - 7 x weekly - 3 sets - 10 reps   ASSESSMENT:  CLINICAL IMPRESSION: Pt responded well to the progression of exercises. Pt demonstrates difficulty with heel/toe strike, so we worked on stepping exercises  to increase step length and heel/toe progression. Continued to progress LE strengthening to tolerance to improve lumbar support and stability while walking. Cues provided throughout session to correct form and technique.  Eval: Adin Laker is a 89 y.o. male who was referred to physical therapy for evaluation and treatment for gait abnormality.    Patient presents with physical impairments of impaired activity tolerance, impaired standing balance, impaired ambulation, and decreased safety awareness impacting safe and independent functional mobility.  Examination revealed patient is at risk  for falls and functional decline as evidenced by the following objective test measures: TUG of 15.25 sec (>13.5 sec indicates increased risk for falls), and 5xSTS of 18.29 sec (>15 sec indicates increased risk for falls and decreased BLE power).  ABC scale score of 39.1% indicates a decreased level of physical functioning, and BERG score of 40/56 indicating a high fall risk and need for using a walker all times.  Gerhart will benefit from skilled PT to address above deficits to improve mobility and activity tolerance to help reach the maximal level of functional independence and mobility. Patient demonstrates understanding of this POC and is in agreement with this plan.   OBJECTIVE IMPAIRMENTS: Abnormal gait, decreased balance, decreased cognition, decreased coordination, decreased mobility, difficulty walking, decreased strength, and decreased safety awareness.   ACTIVITY LIMITATIONS: carrying, lifting, bending, standing, squatting, stairs, bathing, dressing, and locomotion level  PARTICIPATION LIMITATIONS: cleaning, laundry, shopping, and community activity  PERSONAL FACTORS: Age, comorbities:dementia, blood clotting disorder (Factor V), h/o DVT and PE, ataxia, LBP, moderate aortic sclerosi  are also affecting patient's functional outcome.   REHAB POTENTIAL: Good  CLINICAL DECISION MAKING:  Evolving/moderate complexity  EVALUATION COMPLEXITY: Moderate   GOALS: Goals reviewed with patient? Yes  SHORT TERM GOALS: Target date: 09/19/2023  Patient will be independent with initial HEP to improve outcomes and carryover.  Baseline: 100% PT assist required for completion Goal status: IN PROGRESS- 09/03/23 pt reporting good understanding of HEP  2.  Patient will be educated on strategies to decrease risk of falls.  Baseline: 100% PT assist required for recall and spouse needs instruction since didn't come into clinic Goal status: INITIAL  3.  Patient will demonstrate decreased TUG time to </= 13 sec to decrease risk for falls with transitional mobility. Baseline: 15.25 Goal status: INITIAL  LONG TERM GOALS: Target date: 10/17/2023  Patient will be independent with advanced/ongoing HEP to facilitate ability to maintain/progress functional gains from skilled physical therapy services. Baseline: no advanced HEP yet Goal status: INITIAL  2.  Patient will be able to ambulate 600' with or w/o LRAD on variable surfaces with good safety to access community.  Baseline: requires CGA to ambulate safely on level surfaces Goal status: INITIAL  3.  Patient will be able to step up/down curb safely with LRAD for safety with community ambulation.  Baseline: TBD Goal status: INITIAL   4.  Patient will demonstrate improved B hip strength to >/= 4+/5 for improved stability and ease of mobility . Baseline: Refer to above LE MMT table Goal status: INITIAL  5.  Patient will improve 5xSTS time to </= 12 seconds for improved efficiency and safety with transfers. Baseline: 18.29 Goal status: INITIAL   6.  Patient will demonstrate gait speed of >/= 1.8 ft/sec (0.55 m/s) to be a safe limited community ambulator with decreased risk for recurrent falls.  Baseline: TBD Goal status: INITIAL  7.  Patient will improve Berg score to >/= 48/56 to improve safety and stability with ADLs in standing and  reduce risk for falls. (MCID= 8 points)  Baseline: 40 Goal status: INITIAL  10.  Patient will report >/= 60% on ABC scale (MCID = 19%) to demonstrate improved balance confidence with functional mobility and gait. Baseline: 39.1  Goal status: INITIAL   PLAN:  PT FREQUENCY: 1-2x/week  PT DURATION: 8 weeks  PLANNED INTERVENTIONS: [id number]- PT Re-evaluation, 97750- Physical Performance Testing, 97110-Therapeutic exercises, 97530- Therapeutic activity, [id number]- Neuromuscular re-education, 97535- Self Care, [id number]- Manual therapy, [phone]- Gait training, Patient/Family education, Balance  training, Stair training, and Taping  PLAN FOR NEXT SESSION: Continue to stretch and then strengthen with dynamic balance exercise; add stepping activities to increase heel/toe strike and increase step length   Mattia Liford L [place], PTA 09/03/2023, 10:20 AM

## 2023-09-06 ENCOUNTER — Ambulatory Visit: Attending: Physician Assistant | Admitting: Rehabilitation

## 2023-09-06 DIAGNOSIS — R2681 Unsteadiness on feet: Secondary | ICD-10-CM | POA: Diagnosis present

## 2023-09-06 DIAGNOSIS — M6281 Muscle weakness (generalized): Secondary | ICD-10-CM | POA: Insufficient documentation

## 2023-09-06 DIAGNOSIS — M5459 Other low back pain: Secondary | ICD-10-CM | POA: Diagnosis present

## 2023-09-06 DIAGNOSIS — R296 Repeated falls: Secondary | ICD-10-CM | POA: Insufficient documentation

## 2023-09-06 DIAGNOSIS — R269 Unspecified abnormalities of gait and mobility: Secondary | ICD-10-CM | POA: Diagnosis present

## 2023-09-06 NOTE — Therapy (Signed)
 OUTPATIENT PHYSICAL THERAPY NEURO TREATMENT   Patient Name: Shawn [name] MRN: [id number] DOB:Nov 29, 1934, 89 y.o., male Today's Date: 09/06/2023   END OF SESSION:  PT End of Session - 09/06/23 0933     Visit Number 5    PT Start Time 0931    PT Stop Time 1022    PT Time Calculation (min) 51 min    Equipment Utilized During Treatment Gait belt    Activity Tolerance Patient tolerated treatment well;No increased pain           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   Generalized anxiety disorder 10/30/2021   Hemorrhoids    Hepatic cyst 11/19/2007   High serum vitamin D  01/12/2020   History of blood clots    History of colon polyps 11/02/2011   hyperplastic   Hyperlipidemia    Impacted cerumen 07/29/2009   Major depressive disorder 10/30/2021   Osteoarthritis    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   Otitis externa 07/07/2021   Overweight 10/05/2010   Pain of left thumb 02/20/2016   Pulmonary embolism (HCC) 11/20/2009   Right groin pain 11/16/2021   Thrombocytopenia 12/01/2009   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   Past Surgical History:  Procedure Laterality Date   APPENDECTOMY     CATARACT EXTRACTION  02/09/2007   right eye   INGUINAL HERNIA REPAIR     INGUINAL HERNIA REPAIR Right 01/05/2022   Procedure: RIGHT HERNIA REPAIR RECURRENT INGUINAL WITH THREE BY SIX POLYPROPYLENE MESH;  Surgeon: Sebastian Moles, MD;  Location: [facility] OR;  Service: General;  Laterality:  Right;  GEN AND TAP BLOCK   TONSILLECTOMY     VARICOSE VEIN SURGERY     VASECTOMY     Patient Active Problem List   Diagnosis Date Noted   Family history of early CAD 04/26/2023   Essential hypertension 04/26/2023   Major depressive disorder 10/30/2021   Generalized anxiety disorder 10/30/2021   Ataxia of right upper extremity 08/09/2021   Otitis externa 07/07/2021   Dementia due to Alzheimer's disease 03/18/2020   High serum vitamin D  01/12/2020   Pain of left thumb 02/20/2016   History of colon polyps 11/02/2011   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   Overweight 10/05/2010   Backache 04/21/2010   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   Thrombocytopenia 12/01/2009   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   Aortic valve disorder 11/19/2007   Benign neoplasm of skin 08/11/2007   Hyperlipidemia 09/04/2006   Osteoarthritis 09/04/2006   Dysphagia 09/04/2006    PCP: Antonio Cyndee Jamee JONELLE, DO   REFERRING PROVIDER: Dina Camie BRAVO, PA-C   REFERRING DIAG: R26.9 (ICD-10-CM) - Gait abnormality  THERAPY DIAG:  Gait abnormality  Muscle weakness (generalized)  Unsteadiness on feet  Repeated falls  RATIONALE FOR EVALUATION AND TREATMENT: Rehabilitation  ONSET DATE: worsening over the last year   SUBJECTIVE:  SUBJECTIVE STATEMENT: Patient states feels ok today.  Denies any LBP.  He is willing to participate and works hard.  Pt accompanied by: significant other  PAIN: Are you having pain? Yes: NPRS scale: 0/10 on [employer] faces Pain location: low back Pain description: aching, constant Aggravating factors: lyng down Relieving factors: standing and bending  PERTINENT HISTORY:  dementia, blood clotting disorder (Factor V), h/o DVT and PE,  ataxia, LBP, moderate aortic sclerosis  PRECAUTIONS: None  RED FLAGS: None  WEIGHT BEARING RESTRICTIONS: No  FALLS:  Has patient fallen in last 6 months? Yes. 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  PLOF: Independent with gait  PATIENT GOALS: to have better balance   OBJECTIVE: (objective measures completed at initial evaluation unless otherwise dated)  COGNITION: Overall cognitive status: Impaired, History of cognitive impairments - at baseline, and No family/caregiver present to determine baseline cognitive functioning   SENSATION: WFL  COORDINATION: Past pointing and impaired heel to shin BUE and BLE  EDEMA:   None noted  POSTURE:  Mild to moderate thoracic kyphosis  MUSCLE LENGTH: Hamstrings: moderate tightness Ble  LOWER EXTREMITY ROM:     Active  Right eval Left eval  Hip flexion    Hip extension    Hip abduction    Hip adduction    Hip internal rotation    Hip external rotation    Knee flexion    Knee extension -8 to 10 degrees -3 to -4  Ankle dorsiflexion 3-4 0  Ankle plantarflexion    Ankle inversion    Ankle eversion     (Blank rows = not tested)  LOWER EXTREMITY MMT:    MMT Right eval Left eval  Hip flexion 4+ 4+  Hip extension    Hip abduction 4- 4-  Hip adduction    Hip internal rotation 4 4  Hip external rotation 4 4  Knee flexion 5 5  Knee extension 5 5  Ankle dorsiflexion 4+ 4+  Ankle plantarflexion    Ankle inversion    Ankle eversion    (Blank rows = not tested)  TRANSFERS:     Prefers to use hands for all sit to stand, but can do it without       GAIT: Distance walked: into clinic x 200' Assistive device utilized: None Level of assistance: CGA Gait pattern: decreased arm swing- Right, decreased arm swing- Left, decreased step length- Right, and decreased step  length- Left Comments: Slow, shuffling gait on flexed knees with significant thoracic kyphosis, posterior pelvic tilt, moderately unsteady and needs assistance with no device for safety and balance   FUNCTIONAL TESTS:  5xSTS = 18.19 TUG = 15.25 sec BERG =40/56   PATIENT SURVEYS:  ABC Scale is 510/1600 for 31.9% confidence in balance   TODAY'S TREATMENT:  09/06/23: THERAPEUTIC EXERCISE: To improve ROM.  Demonstration, verbal and tactile cues throughout for technique. NuStep  L5 x 6' BUE/BLE  THERAPEUTIC ACTIVITIES: To improve functional performance.  Demonstration, verbal and tactile cues throughout for technique. Seated ham stretch x 1' x 3 BLE Step touch 9 x 10 BLE  NEUROMUSCULAR RE-EDUCATION: To improve balance. Tandem stance eyes open x 1' B;  eyes closed x 1' B Tandem gait F/B x 3 laps at counter Stepping over large foam roll obstacle course S/S X 5; Forward x 5 Long blue foam sidestepping x 4 laps mod decreasing  to min assist w/ practice Long blue foam F/B gait x 4 laps mod assist for balance Cone touch clock pattern using BLE form 9 to 3:00 x 8' with min to mod assist to maintain balance 3 laps around gym looking up, to R, and to L with distant object recognition/verbalization  09/03/23 THERAPEUTIC EXERCISE: To improve ROM.  Demonstration, verbal and tactile cues throughout for technique. Recumbent Bike L2x27min Seated hamstring stretch x 1' BLE THERAPEUTIC ACTIVITIES: To improve functional performance.  Demonstration, verbal and tactile cues throughout for technique. Squats x 2/10 BLE no support Fwd stepping and reaching x 10 BLE Stepping over pool noodle fwd and back x 15 BLE Sidestep YTB at ankles along counter 3x  Standing hip abduction YTB at ankles x 10 BLE  08/30/23 THERAPEUTIC EXERCISE: To improve ROM.  Demonstration, verbal and tactile cues throughout for technique. NuStep  L5 x 5' BUE/BLE  THERAPEUTIC ACTIVITIES: To improve functional performance.   Demonstration, verbal and tactile cues throughout for technique. Seated hamstring stretch x 1' x 2 BLE Seated hip flexor stretch x 1' x 2 BLE Standing at counter back extension x 10 Pelvic rotation x 10 CW; x 10 CCW with min assist Heel/toe lifts at counter 2 x 10 no UE support Squats x 2/10 BLE no support Tandem stance x 30 BLE Standing lumbar extension counter x 10 Standing hip abduction x 10 BLE Standing hip extension x 10 BLE  NEUROMUSCULAR RE-EDUCATION:  Supine Power LTR x 5 bilaterally Supine Power Twist x 5 bilaterally SL R Power Twist x 10 Seated Power Up x 10  Seated Power Rock x 10 BUE Seated Power Twist x 10 BUE  08/27/23 THERAPEUTIC EXERCISE: To improve strength, endurance, ROM, and flexibility.  Nustep L3x46min  Seated hip hinge HS stretch 2 x 30 BLE Seated ball squeeze 2x10 Seated hip ABD RTB 10x3 unilateral Seated marching RTB x 10 BLE Tandem stance x 1' x 2 BLE Tandem gait F/B x 3 laps at counter with 1UE to no support Braiding at counter x 3 laps with intermittent support Resisted gait F/b RTB with intermittent 1UE support  NEUROMUSCULAR RE-EDUCATION:  Heel/toe lifts at counter 2 x 10 Tandem stance x 30 BLE Standing lumbar extension counter Standing hip abduction x 10 BLE Standing hip extension x 10 BLE Standing balance narrow BOS:  Head turns x5  Trunk rotation x 5  Retro steps x 5    Eval: SELF CARE: Provided education to reduce fall risk and to promote safe home environment. Discussed the following home safety modifications:  pt. Doesn't have a shower stool/chair or removable shower head or long handle bath brush/liquid soap.   He is advised to obtain these for safety in shower.  He does not have a grab bar at the toilet and is advised to have one installed.   Advised to remove throw rugs, install night lights  Initial HEP instruction   PATIENT EDUCATION:  Education details: PT eval findings, anticipated POC, and initial HEP  Person  educated: Patient Education method: Explanation, Demonstration, Verbal cues, Tactile cues, and Handouts Education comprehension: verbalized understanding, verbal cues required, tactile cues required, and needs further education  HOME EXERCISE PROGRAM: Access Code: [email or web address] URL: [email or web address] Date: 08/22/2023 Prepared by: Garnette Montclair  Exercises - Heel Toe Raises with Counter Support  - 1 x daily - 7 x weekly - 3 sets - 10 reps - Toe Raises with Counter Support  - 1 x daily - 7 x weekly - 3 sets - 10  reps - Tandem Stance with Support  - 1 x daily - 7 x weekly - 3 sets - 10 reps - Standing Lumbar Extension with Counter  - 1 x daily - 7 x weekly - 3 sets - 10 reps   ASSESSMENT:  CLINICAL IMPRESSION: Progressed with dynamic balance activities.  Patient has difficulty with lateral wt shifting especially to the R.  Encouraged him to be more compliant with his HEP.  He does recall some of it despite his dementia and is encouraged to do it daily.    Eval: Sircharles Holzheimer is a 89 y.o. male who was referred to physical therapy for evaluation and treatment for gait abnormality.    Patient presents with physical impairments of impaired activity tolerance, impaired standing balance, impaired ambulation, and decreased safety awareness impacting safe and independent functional mobility.  Examination revealed patient is at risk for falls and functional decline as evidenced by the following objective test measures: TUG of 15.25 sec (>13.5 sec indicates increased risk for falls), and 5xSTS of 18.29 sec (>15 sec indicates increased risk for falls and decreased BLE power).  ABC scale score of 39.1% indicates a decreased level of physical functioning, and BERG score of 40/56 indicating a high fall risk and need for using a walker all times.  Jessee will benefit from skilled PT to address above deficits to improve mobility and activity tolerance to help reach the maximal level  of functional independence and mobility. Patient demonstrates understanding of this POC and is in agreement with this plan.   OBJECTIVE IMPAIRMENTS: Abnormal gait, decreased balance, decreased cognition, decreased coordination, decreased mobility, difficulty walking, decreased strength, and decreased safety awareness.   ACTIVITY LIMITATIONS: carrying, lifting, bending, standing, squatting, stairs, bathing, dressing, and locomotion level  PARTICIPATION LIMITATIONS: cleaning, laundry, shopping, and community activity  PERSONAL FACTORS: Age, comorbities:dementia, blood clotting disorder (Factor V), h/o DVT and PE, ataxia, LBP, moderate aortic sclerosi  are also affecting patient's functional outcome.   REHAB POTENTIAL: Good  CLINICAL DECISION MAKING: Evolving/moderate complexity  EVALUATION COMPLEXITY: Moderate   GOALS: Goals reviewed with patient? Yes  SHORT TERM GOALS: Target date: 09/19/2023  Patient will be independent with initial HEP to improve outcomes and carryover.  Baseline: 100% PT assist required for completion Goal status: IN PROGRESS- 09/03/23 pt reporting good understanding of HEP  2.  Patient will be educated on strategies to decrease risk of falls.  Baseline: 100% PT assist required for recall and spouse needs instruction since didn't come into clinic Goal status: MET  3.  Patient will demonstrate decreased TUG time to </= 13 sec to decrease risk for falls with transitional mobility. Baseline: 15.25 Goal status: INITIAL  LONG TERM GOALS: Target date: 10/17/2023  Patient will be independent with advanced/ongoing HEP to facilitate ability to maintain/progress functional gains from skilled physical therapy services. Baseline: no advanced HEP yet Goal status: INITIAL  2.  Patient will be able to ambulate 600' with or w/o LRAD on variable surfaces with good safety to access community.  Baseline: requires CGA to ambulate safely on level surfaces Goal status:  INITIAL  3.  Patient will be able to step up/down curb safely with LRAD for safety with community ambulation.  Baseline: TBD Goal status: INITIAL   4.  Patient will demonstrate improved B hip strength to >/= 4+/5 for improved stability and ease of mobility . Baseline: Refer to above LE MMT table Goal status: INITIAL  5.  Patient will improve 5xSTS time to </= 12 seconds  for improved efficiency and safety with transfers. Baseline: 18.29 Goal status: INITIAL   6.  Patient will demonstrate gait speed of >/= 1.8 ft/sec (0.55 m/s) to be a safe limited community ambulator with decreased risk for recurrent falls.  Baseline: TBD Goal status: INITIAL  7.  Patient will improve Berg score to >/= 48/56 to improve safety and stability with ADLs in standing and reduce risk for falls. (MCID= 8 points)  Baseline: 40 Goal status: INITIAL  10.  Patient will report >/= 60% on ABC scale (MCID = 19%) to demonstrate improved balance confidence with functional mobility and gait. Baseline: 39.1  Goal status: INITIAL   PLAN:  PT FREQUENCY: 1-2x/week  PT DURATION: 8 weeks  PLANNED INTERVENTIONS: [id number]- PT Re-evaluation, 97750- Physical Performance Testing, 97110-Therapeutic exercises, 97530- Therapeutic activity, [id number]- Neuromuscular re-education, 97535- Self Care, [id number]- Manual therapy, [id number]- Gait training, Patient/Family education, Balance training, Stair training, and Taping  PLAN FOR NEXT SESSION: stepping activities; dynamic balance progression; review HEP  Rmani Kellogg, PT 09/06/2023, 11:25 AM

## 2023-09-10 ENCOUNTER — Ambulatory Visit

## 2023-09-10 DIAGNOSIS — M6281 Muscle weakness (generalized): Secondary | ICD-10-CM

## 2023-09-10 DIAGNOSIS — R2681 Unsteadiness on feet: Secondary | ICD-10-CM

## 2023-09-10 DIAGNOSIS — R269 Unspecified abnormalities of gait and mobility: Secondary | ICD-10-CM | POA: Diagnosis not present

## 2023-09-10 DIAGNOSIS — R296 Repeated falls: Secondary | ICD-10-CM

## 2023-09-10 NOTE — Therapy (Signed)
 OUTPATIENT PHYSICAL THERAPY NEURO TREATMENT   Patient Name: Shawn [name] MRN: [id number] DOB:10/01/1934, 89 y.o., male Today's Date: 09/10/2023   END OF SESSION:  PT End of Session - 09/10/23 1022     Visit Number 6    Authorization Type [employer] medicare    PT Start Time 1015    PT Stop Time 1100    PT Time Calculation (min) 45 min    Equipment Utilized During Treatment Gait belt    Activity Tolerance Patient tolerated treatment well;No increased pain            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   Generalized anxiety disorder 10/30/2021   Hemorrhoids    Hepatic cyst 11/19/2007   High serum vitamin D  01/12/2020   History of blood clots    History of colon polyps 11/02/2011   hyperplastic   Hyperlipidemia    Impacted cerumen 07/29/2009   Major depressive disorder 10/30/2021   Osteoarthritis    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   Otitis externa 07/07/2021   Overweight 10/05/2010   Pain of left thumb 02/20/2016   Pulmonary embolism (HCC) 11/20/2009   Right groin pain 11/16/2021   Thrombocytopenia 12/01/2009   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   Past Surgical History:  Procedure Laterality Date   APPENDECTOMY     CATARACT EXTRACTION  02/09/2007   right eye   INGUINAL HERNIA REPAIR     INGUINAL HERNIA REPAIR Right 01/05/2022   Procedure: RIGHT HERNIA REPAIR RECURRENT INGUINAL WITH THREE BY SIX POLYPROPYLENE MESH;  Surgeon: Sebastian Moles, MD;  Location: [facility]  OR;  Service: General;  Laterality: Right;  GEN AND TAP BLOCK   TONSILLECTOMY     VARICOSE VEIN SURGERY     VASECTOMY     Patient Active Problem List   Diagnosis Date Noted   Family history of early CAD 04/26/2023   Essential hypertension 04/26/2023   Major depressive disorder 10/30/2021   Generalized anxiety disorder 10/30/2021   Ataxia of right upper extremity 08/09/2021   Otitis externa 07/07/2021   Dementia due to Alzheimer's disease 03/18/2020   High serum vitamin D  01/12/2020   Pain of left thumb 02/20/2016   History of colon polyps 11/02/2011   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   Overweight 10/05/2010   Backache 04/21/2010   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   Thrombocytopenia 12/01/2009   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   Aortic valve disorder 11/19/2007   Benign neoplasm of skin 08/11/2007   Hyperlipidemia 09/04/2006   Osteoarthritis 09/04/2006   Dysphagia 09/04/2006    PCP: Antonio Cyndee Jamee JONELLE, DO   REFERRING PROVIDER: Dina Camie BRAVO, PA-C   REFERRING DIAG: R26.9 (ICD-10-CM) - Gait abnormality  THERAPY DIAG:  Gait abnormality  Muscle weakness (generalized)  Unsteadiness on feet  Repeated falls  RATIONALE FOR EVALUATION AND TREATMENT: Rehabilitation  ONSET DATE: worsening over the last year   SUBJECTIVE:  SUBJECTIVE STATEMENT: Back always hurts when waking up but after moving around it goes away.  Pt accompanied by: significant other  PAIN: Are you having pain? Yes: NPRS scale: 0/10 on [employer] faces Pain location: low back Pain description: aching, constant Aggravating factors: lyng down Relieving factors: standing and bending  PERTINENT HISTORY:  dementia, blood clotting disorder (Factor V), h/o DVT  and PE, ataxia, LBP, moderate aortic sclerosis  PRECAUTIONS: None  RED FLAGS: None  WEIGHT BEARING RESTRICTIONS: No  FALLS:  Has patient fallen in last 6 months? Yes. 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  PLOF: Independent with gait  PATIENT GOALS: to have better balance   OBJECTIVE: (objective measures completed at initial evaluation unless otherwise dated)  COGNITION: Overall cognitive status: Impaired, History of cognitive impairments - at baseline, and No family/caregiver present to determine baseline cognitive functioning   SENSATION: WFL  COORDINATION: Past pointing and impaired heel to shin BUE and BLE  EDEMA:   None noted  POSTURE:  Mild to moderate thoracic kyphosis  MUSCLE LENGTH: Hamstrings: moderate tightness Ble  LOWER EXTREMITY ROM:     Active  Right eval Left eval  Hip flexion    Hip extension    Hip abduction    Hip adduction    Hip internal rotation    Hip external rotation    Knee flexion    Knee extension -8 to 10 degrees -3 to -4  Ankle dorsiflexion 3-4 0  Ankle plantarflexion    Ankle inversion    Ankle eversion     (Blank rows = not tested)  LOWER EXTREMITY MMT:    MMT Right eval Left eval  Hip flexion 4+ 4+  Hip extension    Hip abduction 4- 4-  Hip adduction    Hip internal rotation 4 4  Hip external rotation 4 4  Knee flexion 5 5  Knee extension 5 5  Ankle dorsiflexion 4+ 4+  Ankle plantarflexion    Ankle inversion    Ankle eversion    (Blank rows = not tested)  TRANSFERS:     Prefers to use hands for all sit to stand, but can do it without       GAIT: Distance walked: into clinic x 200' Assistive device utilized: None Level of assistance: CGA Gait pattern: decreased arm swing- Right, decreased arm swing- Left, decreased step length- Right, and decreased  step length- Left Comments: Slow, shuffling gait on flexed knees with significant thoracic kyphosis, posterior pelvic tilt, moderately unsteady and needs assistance with no device for safety and balance   FUNCTIONAL TESTS:  5xSTS = 18.19 TUG = 15.25 sec BERG =40/56   PATIENT SURVEYS:  ABC Scale is 510/1600 for 31.9% confidence in balance   TODAY'S TREATMENT:  09/10/23: THERAPEUTIC EXERCISE: To improve ROM.  Demonstration, verbal and tactile cues throughout for technique. NuStep  L5 x 6' BUE/BLE NEUROMUSCULAR RE-EDUCATION: To improve balance. Clock reaching 5x 12 to 6 o'clock BLE- many cues to perform desired movement STS x 10 no UE support Step up and over 1/2 foam roll 2lb BLE x 10 Lateral step up and over 1/2 foam roll 2lb BLE x 10 Standing hip abduction x 10 BLE 2lb Standing marching x 10 BLE 2lb  Standing hip extension x 10 BLE 2lb Standing lat pull + march GTB x 20  THERAPEUTIC ACTIVITIES: To improve functional performance.  Demonstration, verbal and tactile cues throughout for technique. Steps ups 6' BLE x 20 each Lateral step ups 6BLE x 20 Standing alt shld ext GTB with staggered stance x 12 B- for reciprocal arms swing  09/06/23: THERAPEUTIC EXERCISE: To improve ROM.  Demonstration, verbal and tactile cues throughout for technique. NuStep  L5 x 6' BUE/BLE  THERAPEUTIC ACTIVITIES: To improve functional performance.  Demonstration, verbal and tactile cues throughout for technique. Seated ham stretch x 1' x 3 BLE Step touch 9 x 10 BLE  NEUROMUSCULAR RE-EDUCATION: To improve balance. Tandem stance eyes open x 1' B;  eyes closed x 1' B Tandem gait F/B x 3 laps at counter Stepping over large foam roll obstacle course S/S X 5; Forward x 5 Long blue foam sidestepping x 4 laps mod decreasing to min assist w/ practice Long blue foam F/B gait x 4 laps mod assist for balance Cone touch clock pattern using BLE form 9 to 3:00 x 8' with min to mod assist to maintain balance 3  laps around gym looking up, to R, and to L with distant object recognition/verbalization  09/03/23 THERAPEUTIC EXERCISE: To improve ROM.  Demonstration, verbal and tactile cues throughout for technique. Recumbent Bike L2x68min Seated hamstring stretch x 1' BLE THERAPEUTIC ACTIVITIES: To improve functional performance.  Demonstration, verbal and tactile cues throughout for technique. Squats x 2/10 BLE no support Fwd stepping and reaching x 10 BLE Stepping over pool noodle fwd and back x 15 BLE Sidestep YTB at ankles along counter 3x  Standing hip abduction YTB at ankles x 10 BLE  08/30/23 THERAPEUTIC EXERCISE: To improve ROM.  Demonstration, verbal and tactile cues throughout for technique. NuStep  L5 x 5' BUE/BLE  THERAPEUTIC ACTIVITIES: To improve functional performance.  Demonstration, verbal and tactile cues throughout for technique. Seated hamstring stretch x 1' x 2 BLE Seated hip flexor stretch x 1' x 2 BLE Standing at counter back extension x 10 Pelvic rotation x 10 CW; x 10 CCW with min assist Heel/toe lifts at counter 2 x 10 no UE support Squats x 2/10 BLE no support Tandem stance x 30 BLE Standing lumbar extension counter x 10 Standing hip abduction x 10 BLE Standing hip extension x 10 BLE  NEUROMUSCULAR RE-EDUCATION:  Supine Power LTR x 5 bilaterally Supine Power Twist x 5 bilaterally SL R Power Twist x 10 Seated Power Up x 10  Seated Power Rock x 10 BUE Seated Power Twist x 10 BUE  08/27/23 THERAPEUTIC EXERCISE: To improve strength, endurance, ROM, and flexibility.  Nustep L3x58min  Seated hip hinge HS stretch 2 x 30 BLE Seated ball squeeze 2x10 Seated hip ABD RTB 10x3 unilateral Seated marching RTB x 10 BLE Tandem stance x 1' x 2 BLE Tandem gait F/B x 3 laps at counter with 1UE to no support Braiding at counter x 3 laps with intermittent support Resisted gait F/b RTB with intermittent 1UE support  NEUROMUSCULAR RE-EDUCATION:  Heel/toe lifts at counter 2 x  10 Tandem stance x 30 BLE Standing lumbar extension counter Standing hip abduction x 10 BLE Standing hip extension x 10 BLE Standing balance narrow BOS:  Head turns x5  Trunk rotation x 5  Retro steps x 5    Eval: SELF CARE: Provided education to reduce fall risk and to promote safe home environment. Discussed the following home safety modifications:  pt. Doesn't have a shower stool/chair or removable shower head or long handle bath brush/liquid soap.   He is advised to obtain these for  safety in shower.  He does not have a grab bar at the toilet and is advised to have one installed.   Advised to remove throw rugs, install night lights  Initial HEP instruction   PATIENT EDUCATION:  Education details: PT eval findings, anticipated POC, and initial HEP  Person educated: Patient Education method: Explanation, Demonstration, Verbal cues, Tactile cues, and Handouts Education comprehension: verbalized understanding, verbal cues required, tactile cues required, and needs further education  HOME EXERCISE PROGRAM: Access Code: [email or web address] URL: [email or web address] Date: 08/22/2023 Prepared by: Garnette Montclair  Exercises - Heel Toe Raises with Counter Support  - 1 x daily - 7 x weekly - 3 sets - 10 reps - Toe Raises with Counter Support  - 1 x daily - 7 x weekly - 3 sets - 10 reps - Tandem Stance with Support  - 1 x daily - 7 x weekly - 3 sets - 10 reps - Standing Lumbar Extension with Counter  - 1 x daily - 7 x weekly - 3 sets - 10 reps   ASSESSMENT:  CLINICAL IMPRESSION: Progressed with LE strengthening and core strengthening. Cues required throughout session for posture and to correct movements. Some LOB with lat pulls and marching.   Eval: Shawn [name] is a 89 y.o. male who was referred to physical therapy for evaluation and treatment for gait abnormality.    Patient presents with physical impairments of impaired activity tolerance, impaired standing  balance, impaired ambulation, and decreased safety awareness impacting safe and independent functional mobility.  Examination revealed patient is at risk for falls and functional decline as evidenced by the following objective test measures: TUG of 15.25 sec (>13.5 sec indicates increased risk for falls), and 5xSTS of 18.29 sec (>15 sec indicates increased risk for falls and decreased BLE power).  ABC scale score of 39.1% indicates a decreased level of physical functioning, and BERG score of 40/56 indicating a high fall risk and need for using a walker all times.  Shawn [name] will benefit from skilled PT to address above deficits to improve mobility and activity tolerance to help reach the maximal level of functional independence and mobility. Patient demonstrates understanding of this POC and is in agreement with this plan.   OBJECTIVE IMPAIRMENTS: Abnormal gait, decreased balance, decreased cognition, decreased coordination, decreased mobility, difficulty walking, decreased strength, and decreased safety awareness.   ACTIVITY LIMITATIONS: carrying, lifting, bending, standing, squatting, stairs, bathing, dressing, and locomotion level  PARTICIPATION LIMITATIONS: cleaning, laundry, shopping, and community activity  PERSONAL FACTORS: Age, comorbities:dementia, blood clotting disorder (Factor V), h/o DVT and PE, ataxia, LBP, moderate aortic sclerosi  are also affecting patient's functional outcome.   REHAB POTENTIAL: Good  CLINICAL DECISION MAKING: Evolving/moderate complexity  EVALUATION COMPLEXITY: Moderate   GOALS: Goals reviewed with patient? Yes  SHORT TERM GOALS: Target date: 09/19/2023  Patient will be independent with initial HEP to improve outcomes and carryover.  Baseline: 100% PT assist required for completion Goal status: IN PROGRESS- 09/03/23 pt reporting good understanding of HEP  2.  Patient will be educated on strategies to decrease risk of falls.  Baseline: 100% PT assist required  for recall and spouse needs instruction since didn't come into clinic Goal status: MET  3.  Patient will demonstrate decreased TUG time to </= 13 sec to decrease risk for falls with transitional mobility. Baseline: 15.25 Goal status: INITIAL  LONG TERM GOALS: Target date: 10/17/2023  Patient will be independent with advanced/ongoing HEP to facilitate ability to maintain/progress functional gains from skilled  physical therapy services. Baseline: no advanced HEP yet Goal status: INITIAL  2.  Patient will be able to ambulate 600' with or w/o LRAD on variable surfaces with good safety to access community.  Baseline: requires CGA to ambulate safely on level surfaces Goal status: INITIAL  3.  Patient will be able to step up/down curb safely with LRAD for safety with community ambulation.  Baseline: TBD Goal status: INITIAL   4.  Patient will demonstrate improved B hip strength to >/= 4+/5 for improved stability and ease of mobility . Baseline: Refer to above LE MMT table Goal status: INITIAL  5.  Patient will improve 5xSTS time to </= 12 seconds for improved efficiency and safety with transfers. Baseline: 18.29 Goal status: INITIAL   6.  Patient will demonstrate gait speed of >/= 1.8 ft/sec (0.55 m/s) to be a safe limited community ambulator with decreased risk for recurrent falls.  Baseline: TBD Goal status: INITIAL  7.  Patient will improve Berg score to >/= 48/56 to improve safety and stability with ADLs in standing and reduce risk for falls. (MCID= 8 points)  Baseline: 40 Goal status: INITIAL  10.  Patient will report >/= 60% on ABC scale (MCID = 19%) to demonstrate improved balance confidence with functional mobility and gait. Baseline: 39.1  Goal status: INITIAL   PLAN:  PT FREQUENCY: 1-2x/week  PT DURATION: 8 weeks  PLANNED INTERVENTIONS: [id number]- PT Re-evaluation, 97750- Physical Performance Testing, 97110-Therapeutic exercises, 97530- Therapeutic activity, 97112-  Neuromuscular re-education, 97535- Self Care, [id number]- Manual therapy, [phone]- Gait training, Patient/Family education, Balance training, Stair training, and Taping  PLAN FOR NEXT SESSION: stepping activities; dynamic balance progression; review HEP  Anzley Dibbern L Briselda Naval, PTA 09/10/2023, 11:46 AM +

## 2023-09-12 ENCOUNTER — Ambulatory Visit: Admitting: Rehabilitation

## 2023-09-12 DIAGNOSIS — R269 Unspecified abnormalities of gait and mobility: Secondary | ICD-10-CM

## 2023-09-12 DIAGNOSIS — R2681 Unsteadiness on feet: Secondary | ICD-10-CM

## 2023-09-12 DIAGNOSIS — R296 Repeated falls: Secondary | ICD-10-CM

## 2023-09-12 DIAGNOSIS — M6281 Muscle weakness (generalized): Secondary | ICD-10-CM

## 2023-09-12 NOTE — Therapy (Signed)
 OUTPATIENT PHYSICAL THERAPY NEURO TREATMENT   Patient Name: Shawn [name] MRN: [id number] DOB:May 29, 1934, 89 y.o., male Today's Date: 09/12/2023   END OF SESSION:  PT End of Session - 09/12/23 1013     Visit Number 7    Date for PT Re-Evaluation 10/17/23    Authorization Type [employer] medicare    PT Start Time 1008    PT Stop Time 1103    PT Time Calculation (min) 55 min    Equipment Utilized During Treatment Gait belt    Activity Tolerance Patient tolerated treatment well;No increased pain    Behavior During Therapy [facility] for tasks assessed/performed            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   Generalized anxiety disorder 10/30/2021   Hemorrhoids    Hepatic cyst 11/19/2007   High serum vitamin D  01/12/2020   History of blood clots    History of colon polyps 11/02/2011   hyperplastic   Hyperlipidemia    Impacted cerumen 07/29/2009   Major depressive disorder 10/30/2021   Osteoarthritis    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   Otitis externa 07/07/2021   Overweight 10/05/2010   Pain of left thumb 02/20/2016   Pulmonary embolism (HCC) 11/20/2009   Right groin pain 11/16/2021   Thrombocytopenia 12/01/2009   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   Past Surgical History:  Procedure Laterality Date   APPENDECTOMY     CATARACT EXTRACTION  02/09/2007   right eye   INGUINAL HERNIA REPAIR     INGUINAL HERNIA REPAIR Right 01/05/2022   Procedure: RIGHT HERNIA REPAIR  RECURRENT INGUINAL WITH THREE BY SIX POLYPROPYLENE MESH;  Surgeon: Sebastian Moles, MD;  Location: [facility] OR;  Service: General;  Laterality: Right;  GEN AND TAP BLOCK   TONSILLECTOMY     VARICOSE VEIN SURGERY     VASECTOMY     Patient Active Problem List   Diagnosis Date Noted   Family history of early CAD 04/26/2023   Essential hypertension 04/26/2023   Major depressive disorder 10/30/2021   Generalized anxiety disorder 10/30/2021   Ataxia of right upper extremity 08/09/2021   Otitis externa 07/07/2021   Dementia due to Alzheimer's disease 03/18/2020   High serum vitamin D  01/12/2020   Pain of left thumb 02/20/2016   History of colon polyps 11/02/2011   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   Overweight 10/05/2010   Backache 04/21/2010   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   Thrombocytopenia 12/01/2009   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   Aortic valve disorder 11/19/2007   Benign neoplasm of skin 08/11/2007   Hyperlipidemia 09/04/2006   Osteoarthritis 09/04/2006   Dysphagia 09/04/2006    PCP: Antonio Cyndee Jamee JONELLE, DO   REFERRING PROVIDER: Dina Camie BRAVO, PA-C   REFERRING DIAG: R26.9 (ICD-10-CM) - Gait abnormality  THERAPY DIAG:  Gait abnormality  Muscle weakness (generalized)  Unsteadiness on feet  Repeated falls  RATIONALE FOR EVALUATION AND TREATMENT: Rehabilitation  ONSET DATE: worsening over the last year   SUBJECTIVE:                                                                                                                                                                                                         SUBJECTIVE STATEMENT: Patient reports feels ok.   Denies any falls since last visit.  Pt accompanied by: significant other  PAIN: Are you having pain? Yes: NPRS scale: 0/10 on [employer] faces Pain location: low back Pain description: aching, constant Aggravating factors: lyng down Relieving factors:  standing and bending  PERTINENT HISTORY:  dementia, blood clotting disorder (Factor V), h/o DVT and PE, ataxia, LBP, moderate aortic sclerosis  PRECAUTIONS: None  RED FLAGS: None  WEIGHT BEARING RESTRICTIONS: No  FALLS:  Has patient fallen in last 6 months? Yes. 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  PLOF: Independent with gait  PATIENT GOALS: to have better balance   OBJECTIVE: (objective measures completed at initial evaluation unless otherwise dated)  COGNITION: Overall cognitive status: Impaired, History of cognitive impairments - at baseline, and No family/caregiver present to determine baseline cognitive functioning   SENSATION: WFL  COORDINATION: Past pointing and impaired heel to shin BUE and BLE  EDEMA:   None noted  POSTURE:  Mild to moderate thoracic kyphosis  MUSCLE LENGTH: Hamstrings: moderate tightness Ble  LOWER EXTREMITY ROM:     Active  Right eval Left eval  Hip flexion    Hip extension    Hip abduction    Hip adduction    Hip internal rotation    Hip external rotation    Knee flexion    Knee extension -8 to 10 degrees -3 to -4  Ankle dorsiflexion 3-4 0  Ankle plantarflexion    Ankle inversion    Ankle eversion     (Blank rows = not tested)  LOWER EXTREMITY MMT:    MMT Right eval Left eval  Hip flexion 4+ 4+  Hip extension    Hip abduction 4- 4-  Hip adduction    Hip internal rotation 4 4  Hip external rotation 4 4  Knee flexion 5 5  Knee extension 5 5  Ankle dorsiflexion 4+ 4+  Ankle plantarflexion    Ankle inversion    Ankle eversion    (Blank rows = not tested)  TRANSFERS:     Prefers to use hands for all sit to stand, but  can do it without       GAIT: Distance walked: into clinic x 200' Assistive device utilized: None Level of assistance: CGA Gait pattern:  decreased arm swing- Right, decreased arm swing- Left, decreased step length- Right, and decreased step length- Left Comments: Slow, shuffling gait on flexed knees with significant thoracic kyphosis, posterior pelvic tilt, moderately unsteady and needs assistance with no device for safety and balance   FUNCTIONAL TESTS:  5xSTS = 18.19 TUG = 15.25 sec BERG =40/56   PATIENT SURVEYS:  ABC Scale is 510/1600 for 31.9% confidence in balance   TODAY'S TREATMENT:  09/12/23 THERAPEUTIC EXERCISE: To improve strength.  Demonstration, verbal and tactile cues throughout for technique. Bike L3 x 8' seat position 6  NEUROMUSCULAR RE-EDUCATION: To improve balance. Tandem stand x 1' x 2 BLE Tandem gait up/down mat table x 3 laps F/B Standing on long foam x 1' Tandem gait on foam x 2 laps F/B Sidestepping on foam x 2 laps Stepping up both feet, down both feet on long foam F/B x 10 Large swiss ball seated balance with bouncing x 10, arm raises x 10 B, knee extension x 10 B Standing arm swings reciprocally with PT CGA to min assist x 2' Standing trunk rotation w/ punch across with PT CGA assist x 2' Yellow plyoball toss laterally w/ trunk rotation x 15 L; x 15 R Airex square foam standing w/ ball  basket toss, OH pass, chest pass, bounce pass x 4-5' Resisted gait blue TB F/B and backward/forward x 8-10 laps at mat table Backward walking 1.5 laps around gym   09/10/23: THERAPEUTIC EXERCISE: To improve ROM.  Demonstration, verbal and tactile cues throughout for technique. NuStep  L5 x 6' BUE/BLE NEUROMUSCULAR RE-EDUCATION: To improve balance. Clock reaching 5x 12 to 6 o'clock BLE- many cues to perform desired movement STS x 10 no UE support Step up and over 1/2 foam roll 2lb BLE x 10 Lateral step up and over 1/2 foam roll 2lb BLE x 10 Standing hip abduction x 10 BLE 2lb Standing marching x 10 BLE 2lb  Standing hip extension x 10 BLE 2lb Standing lat pull + march GTB x 20  THERAPEUTIC ACTIVITIES:  To improve functional performance.  Demonstration, verbal and tactile cues throughout for technique. Steps ups 6' BLE x 20 each Lateral step ups 6BLE x 20 Standing alt shld ext GTB with staggered stance x 12 B- for reciprocal arms swing  09/06/23: THERAPEUTIC EXERCISE: To improve ROM.  Demonstration, verbal and tactile cues throughout for technique. NuStep  L5 x 6' BUE/BLE  THERAPEUTIC ACTIVITIES: To improve functional performance.  Demonstration, verbal and tactile cues throughout for technique. Seated ham stretch x 1' x 3 BLE Step touch 9 x 10 BLE  NEUROMUSCULAR RE-EDUCATION: To improve balance. Tandem stance eyes open x 1' B;  eyes closed x 1' B Tandem gait F/B x 3 laps at counter Stepping over large foam roll obstacle course S/S X 5; Forward x 5 Long blue foam sidestepping x 4 laps mod decreasing to min assist w/ practice Long blue foam F/B gait x 4 laps mod assist for balance Cone touch clock pattern using BLE form 9 to 3:00 x 8' with min to mod assist to maintain balance 3 laps around gym looking up, to R, and to L with distant object recognition/verbalization  09/03/23 THERAPEUTIC EXERCISE: To improve ROM.  Demonstration, verbal and tactile cues throughout for technique. Recumbent Bike L2x82min Seated hamstring stretch x 1' BLE THERAPEUTIC ACTIVITIES: To improve  functional performance.  Demonstration, verbal and tactile cues throughout for technique. Squats x 2/10 BLE no support Fwd stepping and reaching x 10 BLE Stepping over pool noodle fwd and back x 15 BLE Sidestep YTB at ankles along counter 3x  Standing hip abduction YTB at ankles x 10 BLE  08/30/23 THERAPEUTIC EXERCISE: To improve ROM.  Demonstration, verbal and tactile cues throughout for technique. NuStep  L5 x 5' BUE/BLE  THERAPEUTIC ACTIVITIES: To improve functional performance.  Demonstration, verbal and tactile cues throughout for technique. Seated hamstring stretch x 1' x 2 BLE Seated hip flexor stretch x 1' x  2 BLE Standing at counter back extension x 10 Pelvic rotation x 10 CW; x 10 CCW with min assist Heel/toe lifts at counter 2 x 10 no UE support Squats x 2/10 BLE no support Tandem stance x 30 BLE Standing lumbar extension counter x 10 Standing hip abduction x 10 BLE Standing hip extension x 10 BLE  NEUROMUSCULAR RE-EDUCATION:  Supine Power LTR x 5 bilaterally Supine Power Twist x 5 bilaterally SL R Power Twist x 10 Seated Power Up x 10  Seated Power Rock x 10 BUE Seated Power Twist x 10 BUE  08/27/23 THERAPEUTIC EXERCISE: To improve strength, endurance, ROM, and flexibility.  Nustep L3x72min  Seated hip hinge HS stretch 2 x 30 BLE Seated ball squeeze 2x10 Seated hip ABD RTB 10x3 unilateral Seated marching RTB x 10 BLE Tandem stance x 1' x 2 BLE Tandem gait F/B x 3 laps at counter with 1UE to no support Braiding at counter x 3 laps with intermittent support Resisted gait F/b RTB with intermittent 1UE support  NEUROMUSCULAR RE-EDUCATION:  Heel/toe lifts at counter 2 x 10 Tandem stance x 30 BLE Standing lumbar extension counter Standing hip abduction x 10 BLE Standing hip extension x 10 BLE Standing balance narrow BOS:  Head turns x5  Trunk rotation x 5  Retro steps x 5    Eval: SELF CARE: Provided education to reduce fall risk and to promote safe home environment. Discussed the following home safety modifications:  pt. Doesn't have a shower stool/chair or removable shower head or long handle bath brush/liquid soap.   He is advised to obtain these for safety in shower.  He does not have a grab bar at the toilet and is advised to have one installed.   Advised to remove throw rugs, install night lights  Initial HEP instruction   PATIENT EDUCATION:  Education details: PT eval findings, anticipated POC, and initial HEP  Person educated: Patient Education method: Explanation, Demonstration, Verbal cues, Tactile cues, and Handouts Education comprehension: verbalized  understanding, verbal cues required, tactile cues required, and needs further education  HOME EXERCISE PROGRAM: Access Code: [email or web address] URL: [email or web address] Date: 08/22/2023 Prepared by: Garnette Montclair  Exercises - Heel Toe Raises with Counter Support  - 1 x daily - 7 x weekly - 3 sets - 10 reps - Toe Raises with Counter Support  - 1 x daily - 7 x weekly - 3 sets - 10 reps - Tandem Stance with Support  - 1 x daily - 7 x weekly - 3 sets - 10 reps - Standing Lumbar Extension with Counter  - 1 x daily - 7 x weekly - 3 sets - 10 reps   ASSESSMENT:  CLINICAL IMPRESSION: Progressed with LE strengthening and core strengthening. Cues required throughout session for posture and to correct movements. Some LOB with lat pulls and marching.   Eval: Shawn [name] is a  89 y.o. male who was referred to physical therapy for evaluation and treatment for gait abnormality.    Patient presents with physical impairments of impaired activity tolerance, impaired standing balance, impaired ambulation, and decreased safety awareness impacting safe and independent functional mobility.  Examination revealed patient is at risk for falls and functional decline as evidenced by the following objective test measures: TUG of 15.25 sec (>13.5 sec indicates increased risk for falls), and 5xSTS of 18.29 sec (>15 sec indicates increased risk for falls and decreased BLE power).  ABC scale score of 39.1% indicates a decreased level of physical functioning, and BERG score of 40/56 indicating a high fall risk and need for using a walker all times.  Esequiel will benefit from skilled PT to address above deficits to improve mobility and activity tolerance to help reach the maximal level of functional independence and mobility. Patient demonstrates understanding of this POC and is in agreement with this plan.   OBJECTIVE IMPAIRMENTS: Abnormal gait, decreased balance, decreased cognition, decreased  coordination, decreased mobility, difficulty walking, decreased strength, and decreased safety awareness.   ACTIVITY LIMITATIONS: carrying, lifting, bending, standing, squatting, stairs, bathing, dressing, and locomotion level  PARTICIPATION LIMITATIONS: cleaning, laundry, shopping, and community activity  PERSONAL FACTORS: Age, comorbities:dementia, blood clotting disorder (Factor V), h/o DVT and PE, ataxia, LBP, moderate aortic sclerosi  are also affecting patient's functional outcome.   REHAB POTENTIAL: Good  CLINICAL DECISION MAKING: Evolving/moderate complexity  EVALUATION COMPLEXITY: Moderate   GOALS: Goals reviewed with patient? Yes  SHORT TERM GOALS: Target date: 09/19/2023  Patient will be independent with initial HEP to improve outcomes and carryover.  Baseline: 100% PT assist required for completion Goal status: IN PROGRESS- 09/03/23 pt reporting good understanding of HEP  2.  Patient will be educated on strategies to decrease risk of falls.  Baseline: 100% PT assist required for recall and spouse needs instruction since didn't come into clinic Goal status: MET  3.  Patient will demonstrate decreased TUG time to </= 13 sec to decrease risk for falls with transitional mobility. Baseline: 15.25 Goal status: INITIAL  LONG TERM GOALS: Target date: 10/17/2023  Patient will be independent with advanced/ongoing HEP to facilitate ability to maintain/progress functional gains from skilled physical therapy services. Baseline: no advanced HEP yet Goal status: INITIAL  2.  Patient will be able to ambulate 600' with or w/o LRAD on variable surfaces with good safety to access community.  Baseline: requires CGA to ambulate safely on level surfaces Goal status: INITIAL  3.  Patient will be able to step up/down curb safely with LRAD for safety with community ambulation.  Baseline: TBD Goal status: INITIAL   4.  Patient will demonstrate improved B hip strength to >/= 4+/5 for  improved stability and ease of mobility . Baseline: Refer to above LE MMT table Goal status: INITIAL  5.  Patient will improve 5xSTS time to </= 12 seconds for improved efficiency and safety with transfers. Baseline: 18.29 Goal status: INITIAL   6.  Patient will demonstrate gait speed of >/= 1.8 ft/sec (0.55 m/s) to be a safe limited community ambulator with decreased risk for recurrent falls.  Baseline: TBD Goal status: INITIAL  7.  Patient will improve Berg score to >/= 48/56 to improve safety and stability with ADLs in standing and reduce risk for falls. (MCID= 8 points)  Baseline: 40 Goal status: INITIAL  10.  Patient will report >/= 60% on ABC scale (MCID = 19%) to demonstrate improved balance confidence with functional mobility  and gait. Baseline: 39.1  Goal status: INITIAL   PLAN:  PT FREQUENCY: 1-2x/week  PT DURATION: 8 weeks  PLANNED INTERVENTIONS: [id number]- PT Re-evaluation, 97750- Physical Performance Testing, 97110-Therapeutic exercises, 97530- Therapeutic activity, [id number]- Neuromuscular re-education, 97535- Self Care, [id number]- Manual therapy, [id number]- Gait training, Patient/Family education, Balance training, Stair training, and Taping  PLAN FOR NEXT SESSION: stepping activities; dynamic balance progression; review HEP  Janah Mcculloh, PT 09/12/2023, 12:16 PM +

## 2023-09-16 ENCOUNTER — Ambulatory Visit

## 2023-09-16 DIAGNOSIS — M6281 Muscle weakness (generalized): Secondary | ICD-10-CM

## 2023-09-16 DIAGNOSIS — R269 Unspecified abnormalities of gait and mobility: Secondary | ICD-10-CM

## 2023-09-16 DIAGNOSIS — R2681 Unsteadiness on feet: Secondary | ICD-10-CM

## 2023-09-16 DIAGNOSIS — R296 Repeated falls: Secondary | ICD-10-CM

## 2023-09-16 NOTE — Therapy (Signed)
 OUTPATIENT PHYSICAL THERAPY NEURO TREATMENT   Patient Name: Shawn [name] MRN: [id number] DOB:[birth year], 89 y.o., male Today's Date: 09/16/2023   END OF SESSION:  PT End of Session - 09/16/23 1108     Visit Number 8    Date for PT Re-Evaluation 10/17/23    Authorization Type [employer]na medicare    PT Start Time 1015    PT Stop Time 1101    PT Time Calculation (min) 46 min    Activity Tolerance Patient tolerated treatment well;No increased pain    Behavior During Therapy [facility] for tasks assessed/performed             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   Generalized anxiety disorder 10/30/2021   Hemorrhoids    Hepatic cyst 11/19/2007   High serum vitamin D  01/12/2020   History of blood clots    History of colon polyps 11/02/2011   hyperplastic   Hyperlipidemia    Impacted cerumen 07/29/2009   Major depressive disorder 10/30/2021   Osteoarthritis    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   Otitis externa 07/07/2021   Overweight 10/05/2010   Pain of left thumb 02/20/2016   Pulmonary embolism (HCC) 11/20/2009   Right groin pain 11/16/2021   Thrombocytopenia 12/01/2009   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   Past Surgical History:  Procedure Laterality Date   APPENDECTOMY     CATARACT EXTRACTION  02/09/2007   right eye   INGUINAL HERNIA REPAIR     INGUINAL HERNIA REPAIR Right 01/05/2022   Procedure: RIGHT HERNIA REPAIR RECURRENT INGUINAL WITH THREE BY SIX  POLYPROPYLENE MESH;  Surgeon: Sebastian Moles, MD;  Location: [facility] OR;  Service: General;  Laterality: Right;  GEN AND TAP BLOCK   TONSILLECTOMY     VARICOSE VEIN SURGERY     VASECTOMY     Patient Active Problem List   Diagnosis Date Noted   Family history of early CAD 04/26/2023   Essential hypertension 04/26/2023   Major depressive disorder 10/30/2021   Generalized anxiety disorder 10/30/2021   Ataxia of right upper extremity 08/09/2021   Otitis externa 07/07/2021   Dementia due to Alzheimer's disease 03/18/2020   High serum vitamin D  01/12/2020   Pain of left thumb 02/20/2016   History of colon polyps 11/02/2011   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   Overweight 10/05/2010   Backache 04/21/2010   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   Thrombocytopenia 12/01/2009   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   Aortic valve disorder 11/19/2007   Benign neoplasm of skin 08/11/2007   Hyperlipidemia 09/04/2006   Osteoarthritis 09/04/2006   Dysphagia 09/04/2006    PCP: Antonio Cyndee Jamee JONELLE, DO   REFERRING PROVIDER: Dina Camie BRAVO, PA-C   REFERRING DIAG: R26.9 (ICD-10-CM) - Gait abnormality  THERAPY DIAG:  Gait abnormality  Muscle weakness (generalized)  Unsteadiness on feet  Repeated falls  RATIONALE FOR EVALUATION AND TREATMENT: Rehabilitation  ONSET DATE: worsening over the last year  SUBJECTIVE:                                                                                                                                                                                                         SUBJECTIVE STATEMENT: Patient reports feels ok.   Denies any falls since last visit.  Pt accompanied by: significant other  PAIN: Are you having pain? Yes: NPRS scale: 0/10 on [employer] faces Pain location: low back Pain description: aching, constant Aggravating factors: lyng down Relieving factors: standing and bending  PERTINENT  HISTORY:  dementia, blood clotting disorder (Factor V), h/o DVT and PE, ataxia, LBP, moderate aortic sclerosis  PRECAUTIONS: None  RED FLAGS: None  WEIGHT BEARING RESTRICTIONS: No  FALLS:  Has patient fallen in last 6 months? Yes. 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  PLOF: Independent with gait  PATIENT GOALS: to have better balance   OBJECTIVE: (objective measures completed at initial evaluation unless otherwise dated)  COGNITION: Overall cognitive status: Impaired, History of cognitive impairments - at baseline, and No family/caregiver present to determine baseline cognitive functioning   SENSATION: WFL  COORDINATION: Past pointing and impaired heel to shin BUE and BLE  EDEMA:   None noted  POSTURE:  Mild to moderate thoracic kyphosis  MUSCLE LENGTH: Hamstrings: moderate tightness Ble  LOWER EXTREMITY ROM:     Active  Right eval Left eval  Hip flexion    Hip extension    Hip abduction    Hip adduction    Hip internal rotation    Hip external rotation    Knee flexion    Knee extension -8 to 10 degrees -3 to -4  Ankle dorsiflexion 3-4 0  Ankle plantarflexion    Ankle inversion    Ankle eversion     (Blank rows = not tested)  LOWER EXTREMITY MMT:    MMT Right eval Left eval  Hip flexion 4+ 4+  Hip extension    Hip abduction 4- 4-  Hip adduction    Hip internal rotation 4 4  Hip external rotation 4 4  Knee flexion 5 5  Knee extension 5 5  Ankle dorsiflexion 4+ 4+  Ankle plantarflexion    Ankle inversion    Ankle eversion    (Blank rows = not tested)  TRANSFERS:     Prefers to use hands for all sit to stand, but can do it without  GAIT: Distance walked: into clinic x 200' Assistive device utilized: None Level of assistance: CGA Gait pattern: decreased arm swing- Right, decreased  arm swing- Left, decreased step length- Right, and decreased step length- Left Comments: Slow, shuffling gait on flexed knees with significant thoracic kyphosis, posterior pelvic tilt, moderately unsteady and needs assistance with no device for safety and balance   FUNCTIONAL TESTS:  5xSTS = 18.19 TUG = 15.25 sec BERG =40/56   PATIENT SURVEYS:  ABC Scale is 510/1600 for 31.9% confidence in balance   TODAY'S TREATMENT:  09/16/23 THERAPEUTIC EXERCISE: To improve strength.  Demonstration, verbal and tactile cues throughout for technique. Nustep L5x58min TUG- 9.58 seconds Seated marching 3lb x 20 BLE Seated LAQ 3lb x 20 BLE  NEUROMUSCULAR RE-EDUCATION: To improve balance and proprioception. 4 way gait with 3lb weights on along counter top 3x each way Lateral stepping over small obstacles with 3lb weights 3x Toe taps to 9' stool 3lb x 10 BLE Lateral steps on and off airex x 10 BLE Standing on airex:  Head turns B x 5  Trunk rotations B x 5  Perturbations normal BOS  Narrow BOS standing balance x 1'  Calf raises x 10 B UE support  09/12/23 THERAPEUTIC EXERCISE: To improve strength.  Demonstration, verbal and tactile cues throughout for technique. Bike L3 x 8' seat position 6  NEUROMUSCULAR RE-EDUCATION: To improve balance. Tandem stand x 1' x 2 BLE Tandem gait up/down mat table x 3 laps F/B Standing on long foam x 1' Tandem gait on foam x 2 laps F/B Sidestepping on foam x 2 laps Stepping up both feet, down both feet on long foam F/B x 10 Large swiss ball seated balance with bouncing x 10, arm raises x 10 B, knee extension x 10 B Standing arm swings reciprocally with PT CGA to min assist x 2' Standing trunk rotation w/ punch across with PT CGA assist x 2' Yellow plyoball toss laterally w/ trunk rotation x 15 L; x 15 R Airex square foam standing w/ ball  basket toss, OH pass, chest pass, bounce pass x 4-5' Resisted gait blue TB F/B and backward/forward x 8-10 laps at mat  table Backward walking 1.5 laps around gym   09/10/23: THERAPEUTIC EXERCISE: To improve ROM.  Demonstration, verbal and tactile cues throughout for technique. NuStep  L5 x 6' BUE/BLE NEUROMUSCULAR RE-EDUCATION: To improve balance. Clock reaching 5x 12 to 6 o'clock BLE- many cues to perform desired movement STS x 10 no UE support Step up and over 1/2 foam roll 2lb BLE x 10 Lateral step up and over 1/2 foam roll 2lb BLE x 10 Standing hip abduction x 10 BLE 2lb Standing marching x 10 BLE 2lb  Standing hip extension x 10 BLE 2lb Standing lat pull + march GTB x 20  THERAPEUTIC ACTIVITIES: To improve functional performance.  Demonstration, verbal and tactile cues throughout for technique. Steps ups 6' BLE x 20 each Lateral step ups 6BLE x 20 Standing alt shld ext GTB with staggered stance x 12 B- for reciprocal arms swing  09/06/23: THERAPEUTIC EXERCISE: To improve ROM.  Demonstration, verbal and tactile cues throughout for technique. NuStep  L5 x 6' BUE/BLE  THERAPEUTIC ACTIVITIES: To improve functional performance.  Demonstration, verbal and tactile cues throughout for technique. Seated ham stretch x 1' x 3 BLE Step touch 9 x 10 BLE  NEUROMUSCULAR RE-EDUCATION: To improve balance. Tandem stance eyes open x 1' B;  eyes closed x 1' B Tandem gait F/B x 3 laps  at counter Stepping over large foam roll obstacle course S/S X 5; Forward x 5 Long blue foam sidestepping x 4 laps mod decreasing to min assist w/ practice Long blue foam F/B gait x 4 laps mod assist for balance Cone touch clock pattern using BLE form 9 to 3:00 x 8' with min to mod assist to maintain balance 3 laps around gym looking up, to R, and to L with distant object recognition/verbalization  09/03/23 THERAPEUTIC EXERCISE: To improve ROM.  Demonstration, verbal and tactile cues throughout for technique. Recumbent Bike L2x11min Seated hamstring stretch x 1' BLE THERAPEUTIC ACTIVITIES: To improve functional performance.   Demonstration, verbal and tactile cues throughout for technique. Squats x 2/10 BLE no support Fwd stepping and reaching x 10 BLE Stepping over pool noodle fwd and back x 15 BLE Sidestep YTB at ankles along counter 3x  Standing hip abduction YTB at ankles x 10 BLE  08/30/23 THERAPEUTIC EXERCISE: To improve ROM.  Demonstration, verbal and tactile cues throughout for technique. NuStep  L5 x 5' BUE/BLE  THERAPEUTIC ACTIVITIES: To improve functional performance.  Demonstration, verbal and tactile cues throughout for technique. Seated hamstring stretch x 1' x 2 BLE Seated hip flexor stretch x 1' x 2 BLE Standing at counter back extension x 10 Pelvic rotation x 10 CW; x 10 CCW with min assist Heel/toe lifts at counter 2 x 10 no UE support Squats x 2/10 BLE no support Tandem stance x 30 BLE Standing lumbar extension counter x 10 Standing hip abduction x 10 BLE Standing hip extension x 10 BLE  NEUROMUSCULAR RE-EDUCATION:  Supine Power LTR x 5 bilaterally Supine Power Twist x 5 bilaterally SL R Power Twist x 10 Seated Power Up x 10  Seated Power Rock x 10 BUE Seated Power Twist x 10 BUE  08/27/23 THERAPEUTIC EXERCISE: To improve strength, endurance, ROM, and flexibility.  Nustep L3x58min  Seated hip hinge HS stretch 2 x 30 BLE Seated ball squeeze 2x10 Seated hip ABD RTB 10x3 unilateral Seated marching RTB x 10 BLE Tandem stance x 1' x 2 BLE Tandem gait F/B x 3 laps at counter with 1UE to no support Braiding at counter x 3 laps with intermittent support Resisted gait F/b RTB with intermittent 1UE support  NEUROMUSCULAR RE-EDUCATION:  Heel/toe lifts at counter 2 x 10 Tandem stance x 30 BLE Standing lumbar extension counter Standing hip abduction x 10 BLE Standing hip extension x 10 BLE Standing balance narrow BOS:  Head turns x5  Trunk rotation x 5  Retro steps x 5    Eval: SELF CARE: Provided education to reduce fall risk and to promote safe home environment. Discussed  the following home safety modifications:  pt. Doesn't have a shower stool/chair or removable shower head or long handle bath brush/liquid soap.   He is advised to obtain these for safety in shower.  He does not have a grab bar at the toilet and is advised to have one installed.   Advised to remove throw rugs, install night lights  Initial HEP instruction   PATIENT EDUCATION:  Education details: PT eval findings, anticipated POC, and initial HEP  Person educated: Patient Education method: Explanation, Demonstration, Verbal cues, Tactile cues, and Handouts Education comprehension: verbalized understanding, verbal cues required, tactile cues required, and needs further education  HOME EXERCISE PROGRAM: Access Code: [email or web address] URL: [email or web address] Date: 08/22/2023 Prepared by: Garnette Montclair  Exercises - Heel Toe Raises with Counter Support  - 1 x daily - 7 x weekly - 3  sets - 10 reps - Toe Raises with Counter Support  - 1 x daily - 7 x weekly - 3 sets - 10 reps - Tandem Stance with Support  - 1 x daily - 7 x weekly - 3 sets - 10 reps - Standing Lumbar Extension with Counter  - 1 x daily - 7 x weekly - 3 sets - 10 reps   ASSESSMENT:  CLINICAL IMPRESSION: Progressed with LE strengthening and core strengthening. Progressed with more complaint surfaces today and continued working to improve foot clearance. Pt continues to require cues for posture throughout session and to pick up feet with dynamic activities.   Eval: Dashton Czerwinski is a 89 y.o. male who was referred to physical therapy for evaluation and treatment for gait abnormality.    Patient presents with physical impairments of impaired activity tolerance, impaired standing balance, impaired ambulation, and decreased safety awareness impacting safe and independent functional mobility.  Examination revealed patient is at risk for falls and functional decline as evidenced by the following objective test  measures: TUG of 15.25 sec (>13.5 sec indicates increased risk for falls), and 5xSTS of 18.29 sec (>15 sec indicates increased risk for falls and decreased BLE power).  ABC scale score of 39.1% indicates a decreased level of physical functioning, and BERG score of 40/56 indicating a high fall risk and need for using a walker all times.  Arnaldo will benefit from skilled PT to address above deficits to improve mobility and activity tolerance to help reach the maximal level of functional independence and mobility. Patient demonstrates understanding of this POC and is in agreement with this plan.   OBJECTIVE IMPAIRMENTS: Abnormal gait, decreased balance, decreased cognition, decreased coordination, decreased mobility, difficulty walking, decreased strength, and decreased safety awareness.   ACTIVITY LIMITATIONS: carrying, lifting, bending, standing, squatting, stairs, bathing, dressing, and locomotion level  PARTICIPATION LIMITATIONS: cleaning, laundry, shopping, and community activity  PERSONAL FACTORS: Age, comorbities:dementia, blood clotting disorder (Factor V), h/o DVT and PE, ataxia, LBP, moderate aortic sclerosi  are also affecting patient's functional outcome.   REHAB POTENTIAL: Good  CLINICAL DECISION MAKING: Evolving/moderate complexity  EVALUATION COMPLEXITY: Moderate   GOALS: Goals reviewed with patient? Yes  SHORT TERM GOALS: Target date: 09/19/2023  Patient will be independent with initial HEP to improve outcomes and carryover.  Baseline: 100% PT assist required for completion Goal status: IN PROGRESS- 09/03/23 pt reporting good understanding of HEP  2.  Patient will be educated on strategies to decrease risk of falls.  Baseline: 100% PT assist required for recall and spouse needs instruction since didn't come into clinic Goal status: MET  3.  Patient will demonstrate decreased TUG time to </= 13 sec to decrease risk for falls with transitional mobility. Baseline: 15.25 Goal  status: MET- 09/16/23  LONG TERM GOALS: Target date: 10/17/2023  Patient will be independent with advanced/ongoing HEP to facilitate ability to maintain/progress functional gains from skilled physical therapy services. Baseline: no advanced HEP yet Goal status: INITIAL  2.  Patient will be able to ambulate 600' with or w/o LRAD on variable surfaces with good safety to access community.  Baseline: requires CGA to ambulate safely on level surfaces Goal status: INITIAL  3.  Patient will be able to step up/down curb safely with LRAD for safety with community ambulation.  Baseline: TBD Goal status: INITIAL   4.  Patient will demonstrate improved B hip strength to >/= 4+/5 for improved stability and ease of mobility . Baseline: Refer to above LE  MMT table Goal status: INITIAL  5.  Patient will improve 5xSTS time to </= 12 seconds for improved efficiency and safety with transfers. Baseline: 18.29 Goal status: INITIAL   6.  Patient will demonstrate gait speed of >/= 1.8 ft/sec (0.55 m/s) to be a safe limited community ambulator with decreased risk for recurrent falls.  Baseline: TBD Goal status: INITIAL  7.  Patient will improve Berg score to >/= 48/56 to improve safety and stability with ADLs in standing and reduce risk for falls. (MCID= 8 points)  Baseline: 40 Goal status: INITIAL  10.  Patient will report >/= 60% on ABC scale (MCID = 19%) to demonstrate improved balance confidence with functional mobility and gait. Baseline: 39.1  Goal status: INITIAL   PLAN:  PT FREQUENCY: 1-2x/week  PT DURATION: 8 weeks  PLANNED INTERVENTIONS: 97164- PT Re-evaluation, 97750- Physical Performance Testing, 97110-Therapeutic exercises, 97530- Therapeutic activity, 97112- Neuromuscular re-education, 97535- Self Care, [id number]- Manual therapy, [phone]- Gait training, Patient/Family education, Balance training, Stair training, and Taping  PLAN FOR NEXT SESSION: stepping activities; dynamic balance  progression; review HEP  Amoreena Neubert L Karsen Fellows, PTA 09/16/2023, 11:08 AM +

## 2023-09-19 ENCOUNTER — Encounter: Payer: Self-pay | Admitting: Rehabilitation

## 2023-09-19 ENCOUNTER — Ambulatory Visit: Admitting: Rehabilitation

## 2023-09-19 DIAGNOSIS — M6281 Muscle weakness (generalized): Secondary | ICD-10-CM

## 2023-09-19 DIAGNOSIS — R296 Repeated falls: Secondary | ICD-10-CM

## 2023-09-19 DIAGNOSIS — R269 Unspecified abnormalities of gait and mobility: Secondary | ICD-10-CM | POA: Diagnosis not present

## 2023-09-19 DIAGNOSIS — R2681 Unsteadiness on feet: Secondary | ICD-10-CM

## 2023-09-19 NOTE — Therapy (Addendum)
 OUTPATIENT PHYSICAL THERAPY NEURO TREATMENT   Patient Name: Shawn [name] MRN: [id number] DOB:30-Sep-1934, 89 y.o., male Today's Date: 09/19/2023   END OF SESSION:  PT End of Session - 09/19/23 1009     Visit Number 9    Date for PT Re-Evaluation 10/17/23    Authorization Type [employer] medicare    PT Start Time 1010    PT Stop Time 1100    PT Time Calculation (min) 50 min    Activity Tolerance Patient tolerated treatment well;No increased pain    Behavior During Therapy [facility] for tasks assessed/performed             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   Generalized anxiety disorder 10/30/2021   Hemorrhoids    Hepatic cyst 11/19/2007   High serum vitamin D  01/12/2020   History of blood clots    History of colon polyps 11/02/2011   hyperplastic   Hyperlipidemia    Impacted cerumen 07/29/2009   Major depressive disorder 10/30/2021   Osteoarthritis    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   Otitis externa 07/07/2021   Overweight 10/05/2010   Pain of left thumb 02/20/2016   Pulmonary embolism (HCC) 11/20/2009   Right groin pain 11/16/2021   Thrombocytopenia 12/01/2009   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   Past Surgical History:  Procedure Laterality Date   APPENDECTOMY     CATARACT EXTRACTION  02/09/2007   right eye   INGUINAL HERNIA REPAIR     INGUINAL HERNIA REPAIR Right 01/05/2022   Procedure: RIGHT HERNIA REPAIR RECURRENT INGUINAL WITH THREE BY SIX  POLYPROPYLENE MESH;  Surgeon: Sebastian Moles, MD;  Location: [facility] OR;  Service: General;  Laterality: Right;  GEN AND TAP BLOCK   TONSILLECTOMY     VARICOSE VEIN SURGERY     VASECTOMY     Patient Active Problem List   Diagnosis Date Noted   Family history of early CAD 04/26/2023   Essential hypertension 04/26/2023   Major depressive disorder 10/30/2021   Generalized anxiety disorder 10/30/2021   Ataxia of right upper extremity 08/09/2021   Otitis externa 07/07/2021   Dementia due to Alzheimer's disease 03/18/2020   High serum vitamin D  01/12/2020   Pain of left thumb 02/20/2016   History of colon polyps 11/02/2011   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   Overweight 10/05/2010   Backache 04/21/2010   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   Thrombocytopenia 12/01/2009   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   Aortic valve disorder 11/19/2007   Benign neoplasm of skin 08/11/2007   Hyperlipidemia 09/04/2006   Osteoarthritis 09/04/2006   Dysphagia 09/04/2006    PCP: Antonio Cyndee Jamee JONELLE, DO   REFERRING PROVIDER: Dina Camie BRAVO, PA-C   REFERRING DIAG: R26.9 (ICD-10-CM) - Gait abnormality  THERAPY DIAG:  Gait abnormality  Muscle weakness (generalized)  Unsteadiness on feet  Repeated falls  RATIONALE FOR EVALUATION AND TREATMENT: Rehabilitation  ONSET DATE: worsening over the last year  SUBJECTIVE:                                                                                                                                                                                                         SUBJECTIVE STATEMENT: Patient reports feels ok.  C/o chronic LBP when he awakens that goes away after an hour and taking Tylenol .  Otherwise feels fine.  Denies any falls.  States doing his HEP some.   Pt accompanied by: significant other  PAIN: Are you having pain? Yes: NPRS scale: 0/10 on [employer] faces Pain location: low back Pain  description: aching, constant Aggravating factors: lyng down Relieving factors: standing and bending  PERTINENT HISTORY:  dementia, blood clotting disorder (Factor V), h/o DVT and PE, ataxia, LBP, moderate aortic sclerosis  PRECAUTIONS: None  RED FLAGS: None  WEIGHT BEARING RESTRICTIONS: No  FALLS:  Has patient fallen in last 6 months? Yes. 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  PLOF: Independent with gait  PATIENT GOALS: to have better balance   OBJECTIVE: (objective measures completed at initial evaluation unless otherwise dated)  COGNITION: Overall cognitive status: Impaired, History of cognitive impairments - at baseline, and No family/caregiver present to determine baseline cognitive functioning   SENSATION: WFL  COORDINATION: Past pointing and impaired heel to shin BUE and BLE  EDEMA:   None noted  POSTURE:  Mild to moderate thoracic kyphosis  MUSCLE LENGTH: Hamstrings: moderate tightness Ble  LOWER EXTREMITY ROM:     Active  Right eval Left eval  Hip flexion    Hip extension    Hip abduction    Hip adduction    Hip internal rotation    Hip external rotation    Knee flexion    Knee extension -8 to 10 degrees -3 to -4  Ankle dorsiflexion 3-4 0  Ankle plantarflexion    Ankle inversion    Ankle eversion     (Blank rows = not tested)  LOWER EXTREMITY MMT:    MMT Right eval Left eval  Hip flexion 4+ 4+  Hip extension    Hip abduction 4- 4-  Hip adduction    Hip internal rotation 4 4  Hip external rotation 4 4  Knee flexion 5 5  Knee extension 5 5  Ankle dorsiflexion 4+ 4+  Ankle plantarflexion    Ankle inversion    Ankle eversion    (Blank rows = not tested)  TRANSFERS:  Prefers to use hands for all sit to stand, but can do it without       GAIT: Distance walked: into clinic x  200' Assistive device utilized: None Level of assistance: CGA Gait pattern: decreased arm swing- Right, decreased arm swing- Left, decreased step length- Right, and decreased step length- Left Comments: Slow, shuffling gait on flexed knees with significant thoracic kyphosis, posterior pelvic tilt, moderately unsteady and needs assistance with no device for safety and balance   FUNCTIONAL TESTS:  5xSTS = 18.19 TUG = 15.25 sec BERG =40/56   PATIENT SURVEYS:  ABC Scale is 510/1600 for 31.9% confidence in balance   TODAY'S TREATMENT:  09/19/23 THERAPEUTIC EXERCISE: To improve strength.  Demonstration, verbal and tactile cues throughout for technique. Nustep L6 x 8'  THERAPEUTIC ACTIVITIES: To improve functional performance.  Demonstration, verbal and tactile cues throughout for technique. Seated hamstring stretch x 1' x 2 BLE Seated FABER stretch x 1' x 2 BLE Standing back extension x 2/15  NEUROMUSCULAR RE-EDUCATION: To improve balance. Box stepping w/ PVC x 5 laps each direction Lg foam roll step overs Tandem stance x 1' B Tandem gait F/B x 5 laps [place]arioca/braiding Front cross x 6 laps at counter; back cross x 5 laps at counter Walking across foam mats obstacle course  09/16/23 THERAPEUTIC EXERCISE: To improve strength.  Demonstration, verbal and tactile cues throughout for technique. Nustep L5x71min TUG- 9.58 seconds Seated marching 3lb x 20 BLE Seated LAQ 3lb x 20 BLE  NEUROMUSCULAR RE-EDUCATION: To improve balance and proprioception. 4 way gait with 3lb weights on along counter top 3x each way Lateral stepping over small obstacles with 3lb weights 3x Toe taps to 9' stool 3lb x 10 BLE Lateral steps on and off airex x 10 BLE Standing on airex:  Head turns B x 5  Trunk rotations B x 5  Perturbations normal BOS  Narrow BOS standing balance x 1'  Calf raises x 10 B UE support  09/12/23 THERAPEUTIC EXERCISE: To improve strength.  Demonstration, verbal and tactile cues  throughout for technique. Bike L3 x 8' seat position 6  NEUROMUSCULAR RE-EDUCATION: To improve balance. Tandem stand x 1' x 2 BLE Tandem gait up/down mat table x 3 laps F/B Standing on long foam x 1' Tandem gait on foam x 2 laps F/B Sidestepping on foam x 2 laps Stepping up both feet, down both feet on long foam F/B x 10 Large swiss ball seated balance with bouncing x 10, arm raises x 10 B, knee extension x 10 B Standing arm swings reciprocally with PT CGA to min assist x 2' Standing trunk rotation w/ punch across with PT CGA assist x 2' Yellow plyoball toss laterally w/ trunk rotation x 15 L; x 15 R Airex square foam standing w/ ball  basket toss, OH pass, chest pass, bounce pass x 4-5' Resisted gait blue TB F/B and backward/forward x 8-10 laps at mat table Backward walking 1.5 laps around gym   09/10/23: THERAPEUTIC EXERCISE: To improve ROM.  Demonstration, verbal and tactile cues throughout for technique. NuStep  L5 x 6' BUE/BLE NEUROMUSCULAR RE-EDUCATION: To improve balance. Clock reaching 5x 12 to 6 o'clock BLE- many cues to perform desired movement STS x 10 no UE support Step up and over 1/2 foam roll 2lb BLE x 10 Lateral step up and over 1/2 foam roll 2lb BLE x 10 Standing hip abduction x 10 BLE 2lb Standing marching x 10 BLE 2lb  Standing hip extension x 10 BLE 2lb  Standing lat pull + march GTB x 20  THERAPEUTIC ACTIVITIES: To improve functional performance.  Demonstration, verbal and tactile cues throughout for technique. Steps ups 6' BLE x 20 each Lateral step ups 6BLE x 20 Standing alt shld ext GTB with staggered stance x 12 B- for reciprocal arms swing    PATIENT EDUCATION:  Education details: HEP update; hamstring stretching, piriformis stretching  Person educated: Patient Education method: Explanation, Demonstration, Verbal cues, Tactile cues, and Handouts Education comprehension: verbalized understanding, verbal cues required, tactile cues required, and needs  further education  HOME EXERCISE PROGRAM: Access Code: [email or web address] URL: [email or web address] Date: 09/19/2023 Prepared by: Garnette Montclair  Exercises - Heel Toe Raises with Counter Support  - 1 x daily - 7 x weekly - 3 sets - 10 reps - Toe Raises with Counter Support  - 1 x daily - 7 x weekly - 3 sets - 10 reps - Tandem Stance with Support  - 1 x daily - 7 x weekly - 3 sets - 10 reps - Step Taps on Low Step  - 1 x daily - 7 x weekly - 3 sets - 10 reps - Standing Lumbar Extension with Counter  - 1 x daily - 7 x weekly - 3 sets - 10 reps - Seated Piriformis Stretch with Trunk Bend  - 1 x daily - 7 x weekly - 1 sets - 1 reps - 1 min hold - Seated Hamstring Stretch  - 1 x daily - 7 x weekly - 1 sets - 1 reps - 1 min hold  ASSESSMENT:  CLINICAL IMPRESSION: Patient is continuing to make slow, steady improvement with his balance.  Gait is better today after he does LE stretching.  Added stretching to his HEP.  Spouse provided the handouts to remind the patient to perform HEP.  Patient's biggest difficulty remains wt shifting to the L.  His performance today on box stepping is good.  Next visit will reevaluate BERG, ABC outcome test, TUG, strength and would expect to see improvements in all.     Eval: Shawn [name] is a 89 y.o. male who was referred to physical therapy for evaluation and treatment for gait abnormality.    Patient presents with physical impairments of impaired activity tolerance, impaired standing balance, impaired ambulation, and decreased safety awareness impacting safe and independent functional mobility.  Examination revealed patient is at risk for falls and functional decline as evidenced by the following objective test measures: TUG of 15.25 sec (>13.5 sec indicates increased risk for falls), and 5xSTS of 18.29 sec (>15 sec indicates increased risk for falls and decreased BLE power).  ABC scale score of 39.1% indicates a decreased level of physical  functioning, and BERG score of 40/56 indicating a high fall risk and need for using a walker all times.  Shawn [name] will benefit from skilled PT to address above deficits to improve mobility and activity tolerance to help reach the maximal level of functional independence and mobility. Patient demonstrates understanding of this POC and is in agreement with this plan.   OBJECTIVE IMPAIRMENTS: Abnormal gait, decreased balance, decreased cognition, decreased coordination, decreased mobility, difficulty walking, decreased strength, and decreased safety awareness.   ACTIVITY LIMITATIONS: carrying, lifting, bending, standing, squatting, stairs, bathing, dressing, and locomotion level  PARTICIPATION LIMITATIONS: cleaning, laundry, shopping, and community activity  PERSONAL FACTORS: Age, comorbities:dementia, blood clotting disorder (Factor V), h/o DVT and PE, ataxia, LBP, moderate aortic sclerosi  are also affecting patient's functional outcome.   REHAB POTENTIAL: Good  CLINICAL DECISION MAKING: Evolving/moderate complexity  EVALUATION COMPLEXITY: Moderate   GOALS: Goals reviewed with patient? Yes  SHORT TERM GOALS: Target date: 09/19/2023  Patient will be independent with initial HEP to improve outcomes and carryover.  Baseline: 100% PT assist required for completion Goal status: IN PROGRESS- 09/03/23 pt reporting good understanding of HEP  2.  Patient will be educated on strategies to decrease risk of falls.  Baseline: 100% PT assist required for recall and spouse needs instruction since didn't come into clinic Goal status: MET  3.  Patient will demonstrate decreased TUG time to </= 13 sec to decrease risk for falls with transitional mobility. Baseline: 15.25 Goal status: MET- 09/16/23  LONG TERM GOALS: Target date: 10/17/2023  Patient will be independent with advanced/ongoing HEP to facilitate ability to maintain/progress functional gains from skilled physical therapy services. Baseline: no  advanced HEP yet Goal status: INITIAL  2.  Patient will be able to ambulate 600' with or w/o LRAD on variable surfaces with good safety to access community.  Baseline: requires CGA to ambulate safely on level surfaces Goal status: INITIAL  3.  Patient will be able to step up/down curb safely with LRAD for safety with community ambulation.  Baseline: TBD Goal status: INITIAL   4.  Patient will demonstrate improved B hip strength to >/= 4+/5 for improved stability and ease of mobility . Baseline: Refer to above LE MMT table Goal status: INITIAL  5.  Patient will improve 5xSTS time to </= 12 seconds for improved efficiency and safety with transfers. Baseline: 18.29 Goal status: INITIAL   6.  Patient will demonstrate gait speed of >/= 1.8 ft/sec (0.55 m/s) to be a safe limited community ambulator with decreased risk for recurrent falls.  Baseline: TBD Goal status: INITIAL  7.  Patient will improve Berg score to >/= 48/56 to improve safety and stability with ADLs in standing and reduce risk for falls. (MCID= 8 points)  Baseline: 40 Goal status: INITIAL  10.  Patient will report >/= 60% on ABC scale (MCID = 19%) to demonstrate improved balance confidence with functional mobility and gait. Baseline: 39.1  Goal status: INITIAL   PLAN:  PT FREQUENCY: 1-2x/week  PT DURATION: 8 weeks  PLANNED INTERVENTIONS: [id number]- PT Re-evaluation, 97750- Physical Performance Testing, 97110-Therapeutic exercises, 97530- Therapeutic activity, [id number]- Neuromuscular re-education, 97535- Self Care, [id number]- Manual therapy, [phone]- Gait training, Patient/Family education, Balance training, Stair training, and Taping  PLAN FOR NEXT SESSION: reassess progress to goals for 10th visit; BERG, ABC, TUG  Katharina Jehle, PT 09/19/2023, 8:19 PM +

## 2023-09-23 ENCOUNTER — Ambulatory Visit: Admitting: Rehabilitation

## 2023-09-23 DIAGNOSIS — R269 Unspecified abnormalities of gait and mobility: Secondary | ICD-10-CM | POA: Diagnosis not present

## 2023-09-23 DIAGNOSIS — M6281 Muscle weakness (generalized): Secondary | ICD-10-CM

## 2023-09-23 DIAGNOSIS — R2681 Unsteadiness on feet: Secondary | ICD-10-CM

## 2023-09-23 DIAGNOSIS — R296 Repeated falls: Secondary | ICD-10-CM

## 2023-09-23 NOTE — Therapy (Signed)
 OUTPATIENT PHYSICAL THERAPY NEURO TREATMENT / PROGRESS NOTE  Progress Note Reporting Period 08/22/23 to 09/23/2023  See note below for Objective Data and Assessment of Progress/Goals.   Patient Name: Shawn [name] MRN: [id number] DOB:Aug 03, 1934, 89 y.o., male Today's Date: 09/23/2023   END OF SESSION:  PT End of Session - 09/23/23 1457     Visit Number 10    Date for PT Re-Evaluation 10/17/23    Authorization Type [employer] medicare    PT Start Time 1400    PT Stop Time 1450    PT Time Calculation (min) 50 min    Activity Tolerance Patient tolerated treatment well;No increased pain    Behavior During Therapy [facility] for tasks assessed/performed           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   Generalized anxiety disorder 10/30/2021   Hemorrhoids    Hepatic cyst 11/19/2007   High serum vitamin D  01/12/2020   History of blood clots    History of colon polyps 11/02/2011   hyperplastic   Hyperlipidemia    Impacted cerumen 07/29/2009   Major depressive disorder 10/30/2021   Osteoarthritis    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   Otitis externa 07/07/2021   Overweight 10/05/2010   Pain of left thumb 02/20/2016   Pulmonary embolism (HCC) 11/20/2009   Right groin pain 11/16/2021   Thrombocytopenia 12/01/2009   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   Past Surgical History:  Procedure Laterality Date   APPENDECTOMY     CATARACT EXTRACTION  02/09/2007   right eye   INGUINAL  HERNIA REPAIR     INGUINAL HERNIA REPAIR Right 01/05/2022   Procedure: RIGHT HERNIA REPAIR RECURRENT INGUINAL WITH THREE BY SIX POLYPROPYLENE MESH;  Surgeon: Sebastian Moles, MD;  Location: [facility] OR;  Service: General;  Laterality: Right;  GEN AND TAP BLOCK   TONSILLECTOMY     VARICOSE VEIN SURGERY     VASECTOMY     Patient Active Problem List   Diagnosis Date Noted   Family history of early CAD 04/26/2023   Essential hypertension 04/26/2023   Major depressive disorder 10/30/2021   Generalized anxiety disorder 10/30/2021   Ataxia of right upper extremity 08/09/2021   Otitis externa 07/07/2021   Dementia due to Alzheimer's disease 03/18/2020   High serum vitamin D  01/12/2020   Pain of left thumb 02/20/2016   History of colon polyps 11/02/2011   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   Overweight 10/05/2010   Backache 04/21/2010   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   Thrombocytopenia 12/01/2009   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   Aortic valve disorder 11/19/2007   Benign neoplasm of skin 08/11/2007   Hyperlipidemia 09/04/2006   Osteoarthritis 09/04/2006   Dysphagia 09/04/2006    PCP: Antonio Cyndee Jamee JONELLE, DO   REFERRING PROVIDER: Dina Camie BRAVO, PA-C   REFERRING DIAG: R26.9 (ICD-10-CM) - Gait abnormality  THERAPY DIAG:  Gait abnormality  Muscle weakness (generalized)  Unsteadiness  on feet  Repeated falls  RATIONALE FOR EVALUATION AND TREATMENT: Rehabilitation  ONSET DATE: worsening over the last year   SUBJECTIVE:                                                                                                                                                                                                         SUBJECTIVE STATEMENT: States feels ok today.  Denies any falls or med changes.  Denies any pain.  Pt accompanied by: significant other  PAIN: Are you having pain? Yes: NPRS scale: 0/10 on [employer] faces Pain  location: low back Pain description: aching, constant Aggravating factors: lyng down Relieving factors: standing and bending  PERTINENT HISTORY:  dementia, blood clotting disorder (Factor V), h/o DVT and PE, ataxia, LBP, moderate aortic sclerosis  PRECAUTIONS: None  RED FLAGS: None  WEIGHT BEARING RESTRICTIONS: No  FALLS:  Has patient fallen in last 6 months? Yes. 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  PLOF: Independent with gait  PATIENT GOALS: to have better balance   OBJECTIVE: (objective measures completed at initial evaluation unless otherwise dated)  COGNITION: Overall cognitive status: Impaired, History of cognitive impairments - at baseline, and No family/caregiver present to determine baseline cognitive functioning   SENSATION: WFL  COORDINATION: Past pointing and impaired heel to shin BUE and BLE  EDEMA:   None noted  POSTURE:  Mild to moderate thoracic kyphosis  MUSCLE LENGTH: Hamstrings: moderate tightness Ble  LOWER EXTREMITY ROM:     Active  Right eval Left eval  Hip flexion    Hip extension    Hip abduction    Hip adduction    Hip internal rotation    Hip external rotation    Knee flexion    Knee extension -8 to 10 degrees -3 to -4  Ankle dorsiflexion 3-4 0  Ankle plantarflexion    Ankle inversion    Ankle eversion     (Blank rows = not tested)  LOWER EXTREMITY MMT:    MMT Right eval Left eval  Hip flexion 4+ 4+  Hip extension    Hip abduction 4- 4-  Hip adduction    Hip internal rotation 4 4  Hip external rotation 4 4  Knee flexion 5 5  Knee extension 5 5  Ankle dorsiflexion 4+ 4+  Ankle plantarflexion    Ankle inversion    Ankle eversion    (Blank rows = not tested)  TRANSFERS:     Prefers to use hands for all sit to stand, but can do it without        GAIT: Distance walked: into clinic x 200' Assistive device utilized: None Level of assistance: CGA Gait pattern: decreased arm swing- Right, decreased arm swing- Left, decreased step length- Right, and decreased step length- Left Comments: Slow, shuffling gait on flexed knees with significant thoracic kyphosis, posterior pelvic tilt, moderately unsteady and needs assistance with no device for safety and balance   FUNCTIONAL TESTS:  5xSTS = 18.19 TUG = 15.25 sec BERG =40/56   PATIENT SURVEYS:  ABC Scale is 510/1600 for 31.9% confidence in balance   TODAY'S TREATMENT:  09/23/23 THERAPEUTIC EXERCISE: To improve strength.  Demonstration, verbal and tactile cues throughout for technique. Nustep L6 x 8'  PHYSICAL PERFORMANCE TEST or MEASUREMENT:   [facility] PT Assessment - 09/23/23 0001       Berg Balance Test   Sit to Stand Able to stand  independently using hands    Standing Unsupported Able to stand safely 2 minutes    Sitting with Back Unsupported but Feet Supported on Floor or Stool Able to sit safely and securely 2 minutes    Stand to Sit Sits safely with minimal use of hands    Transfers Able to transfer safely, minor use of hands    Standing Unsupported with Eyes Closed Able to stand 10 seconds safely    Standing Unsupported with Feet Together Able to place feet together independently and stand 1 minute safely    From Standing, Reach Forward with Outstretched Arm Can reach confidently >25 cm (10)    From Standing Position, Pick up Object from Floor Able to pick up shoe safely and easily    From Standing Position, Turn to Look Behind Over each Shoulder Looks behind one side only/other side shows less weight shift    Turn 360 Degrees Able to turn 360 degrees safely one side only in 4 seconds or less    Standing Unsupported, Alternately Place Feet on Step/Stool Able to stand independently and safely and complete 8 steps in 20 seconds    Standing Unsupported, One Foot in [employer] balance while stepping or standing    Standing on One Leg Tries to lift leg/unable to hold 3 seconds but remains standing independently    Total Score 46         NEUROMUSCULAR RE-EDUCATION: To improve balance. 8 step lunge return, alternating feet x 10 Cone/star touch 12-5:00 x 5 trials BLE Star slides w/ disc 12-5:00 x 4 trials    09/19/23 THERAPEUTIC EXERCISE: To improve strength.  Demonstration, verbal and tactile cues throughout for technique. Nustep L6 x 8'  THERAPEUTIC ACTIVITIES: To improve functional performance.  Demonstration, verbal and tactile cues throughout for technique. Seated hamstring stretch x 1' x 2 BLE Seated FABER stretch x 1' x 2 BLE Standing back extension x 2/15  NEUROMUSCULAR RE-EDUCATION: To improve balance. Box stepping w/ PVC x 5 laps each direction Lg foam roll step overs Tandem stance x 1' B Tandem gait F/B x 5 laps [place]/braiding Front cross x 6 laps at counter; back cross x 5 laps at counter Walking across foam mats obstacle course  09/16/23 THERAPEUTIC EXERCISE: To improve strength.  Demonstration, verbal and tactile cues throughout for technique. Nustep L5x56min TUG- 9.58 seconds Seated marching 3lb x 20 BLE Seated LAQ 3lb x 20 BLE  NEUROMUSCULAR RE-EDUCATION: To improve balance and proprioception. 4 way gait with 3lb weights  on along counter top 3x each way Lateral stepping over small obstacles with 3lb weights 3x Toe taps to 9' stool 3lb x 10 BLE Lateral steps on and off airex x 10 BLE Standing on airex:  Head turns B x 5  Trunk rotations B x 5  Perturbations normal BOS  Narrow BOS standing balance x 1'  Calf raises x 10 B UE support  09/12/23 THERAPEUTIC EXERCISE: To improve strength.  Demonstration, verbal and tactile cues throughout for technique. Bike L3 x 8' seat position 6  NEUROMUSCULAR RE-EDUCATION: To improve balance. Tandem stand x 1' x 2 BLE Tandem gait up/down mat table x 3 laps F/B Standing on long foam x  1' Tandem gait on foam x 2 laps F/B Sidestepping on foam x 2 laps Stepping up both feet, down both feet on long foam F/B x 10 Large swiss ball seated balance with bouncing x 10, arm raises x 10 B, knee extension x 10 B Standing arm swings reciprocally with PT CGA to min assist x 2' Standing trunk rotation w/ punch across with PT CGA assist x 2' Yellow plyoball toss laterally w/ trunk rotation x 15 L; x 15 R Airex square foam standing w/ ball  basket toss, OH pass, chest pass, bounce pass x 4-5' Resisted gait blue TB F/B and backward/forward x 8-10 laps at mat table Backward walking 1.5 laps around gym   09/10/23: THERAPEUTIC EXERCISE: To improve ROM.  Demonstration, verbal and tactile cues throughout for technique. NuStep  L5 x 6' BUE/BLE NEUROMUSCULAR RE-EDUCATION: To improve balance. Clock reaching 5x 12 to 6 o'clock BLE- many cues to perform desired movement STS x 10 no UE support Step up and over 1/2 foam roll 2lb BLE x 10 Lateral step up and over 1/2 foam roll 2lb BLE x 10 Standing hip abduction x 10 BLE 2lb Standing marching x 10 BLE 2lb  Standing hip extension x 10 BLE 2lb Standing lat pull + march GTB x 20  THERAPEUTIC ACTIVITIES: To improve functional performance.  Demonstration, verbal and tactile cues throughout for technique. Steps ups 6' BLE x 20 each Lateral step ups 6BLE x 20 Standing alt shld ext GTB with staggered stance x 12 B- for reciprocal arms swing    PATIENT EDUCATION:  Education details: HEP update; adding SLS at home in corner Person educated: Patient Education method: Explanation, Demonstration, Verbal cues, Tactile cues, and Handouts Education comprehension: verbalized understanding, verbal cues required, tactile cues required, and needs further education  HOME EXERCISE PROGRAM: Access Code: [email or web address] URL: [email or web address] Date: 09/19/2023 Prepared by: Garnette Montclair  Exercises - Heel Toe Raises with Counter Support  - 1 x  daily - 7 x weekly - 3 sets - 10 reps - Toe Raises with Counter Support  - 1 x daily - 7 x weekly - 3 sets - 10 reps - Tandem Stance with Support  - 1 x daily - 7 x weekly - 3 sets - 10 reps - Step Taps on Low Step  - 1 x daily - 7 x weekly - 3 sets - 10 reps - Standing Lumbar Extension with Counter  - 1 x daily - 7 x weekly - 3 sets - 10 reps - Seated Piriformis Stretch with Trunk Bend  - 1 x daily - 7 x weekly - 1 sets - 1 reps - 1 min hold - Seated Hamstring Stretch  - 1 x daily - 7 x weekly - 1 sets - 1 reps - 1 min hold  ASSESSMENT:  CLINICAL IMPRESSION: Patient is continuing to make slow, steady improvement with his balance.  Gait is better today after he does LE stretching.  Added stretching to his HEP.  Spouse provided the handouts to remind the patient to perform HEP.  Patient's biggest difficulty remains wt shifting to the L.  His performance today on box stepping is good.  Next visit will reevaluate BERG, ABC outcome test, TUG, strength and would expect to see improvements in all.     Eval: Raequon Catanzaro is a 89 y.o. male who was referred to physical therapy for evaluation and treatment for gait abnormality.    Patient presents with physical impairments of impaired activity tolerance, impaired standing balance, impaired ambulation, and decreased safety awareness impacting safe and independent functional mobility.  Examination revealed patient is at risk for falls and functional decline as evidenced by the following objective test measures: TUG of 15.25 sec (>13.5 sec indicates increased risk for falls), and 5xSTS of 18.29 sec (>15 sec indicates increased risk for falls and decreased BLE power).  ABC scale score of 39.1% indicates a decreased level of physical functioning, and BERG score of 40/56 indicating a high fall risk and need for using a walker all times.  [place] will benefit from skilled PT to address above deficits to improve mobility and activity tolerance to help reach  the maximal level of functional independence and mobility. Patient demonstrates understanding of this POC and is in agreement with this plan.   OBJECTIVE IMPAIRMENTS: Abnormal gait, decreased balance, decreased cognition, decreased coordination, decreased mobility, difficulty walking, decreased strength, and decreased safety awareness.   ACTIVITY LIMITATIONS: carrying, lifting, bending, standing, squatting, stairs, bathing, dressing, and locomotion level  PARTICIPATION LIMITATIONS: cleaning, laundry, shopping, and community activity  PERSONAL FACTORS: Age, comorbities:dementia, blood clotting disorder (Factor V), h/o DVT and PE, ataxia, LBP, moderate aortic sclerosi  are also affecting patient's functional outcome.   REHAB POTENTIAL: Good  CLINICAL DECISION MAKING: Evolving/moderate complexity  EVALUATION COMPLEXITY: Moderate   GOALS: Goals reviewed with patient? Yes  SHORT TERM GOALS: Target date: 09/19/2023  Patient will be independent with initial HEP to improve outcomes and carryover.  Baseline: 100% PT assist required for completion Goal status: MET- 09/03/23 pt reporting good understanding of HEP;  09/23/23:  spouse independently assists with all HEP   2.  Patient will be educated on strategies to decrease risk of falls.  Baseline: 100% PT assist required for recall and spouse needs instruction since didn't come into clinic Goal status: MET  3.  Patient will demonstrate decreased TUG time to </= 13 sec to decrease risk for falls with transitional mobility. Baseline: 15.25 09/23/23:  12.26  Goal status: MET- 09/16/23  LONG TERM GOALS: Target date: 10/17/2023  Patient will be independent with advanced/ongoing HEP to facilitate ability to maintain/progress functional gains from skilled physical therapy services. Baseline: no advanced HEP yet 09/23/23:  Has advanced HEP;  spouse is assisting Goal status: IN PROGRESS  2.  Patient will be able to ambulate 600' with or w/o LRAD on  variable surfaces with good safety to access community.  Baseline: requires CGA to ambulate safely on level surfaces Goal status: INITIAL  3.  Patient will be able to step up/down curb safely with LRAD for safety with community ambulation.  Baseline: TBD Goal status: INITIAL   4.  Patient will demonstrate improved B hip strength to >/= 4+/5 for improved stability and ease of mobility . Baseline: Refer to above LE MMT table Goal status: INITIAL  5.  Patient will improve 5xSTS time to </= 12 seconds for improved efficiency and safety with transfers. Baseline: 18.29 09/23/23:  16.24 sec Goal status: IN PROGRESS   6.  Patient will demonstrate gait speed of >/= 1.8 ft/sec (0.55 m/s) to be a safe limited community ambulator with decreased risk for recurrent falls.  Baseline: TBD Goal status: INITIAL  7.  Patient will improve Berg score to >/= 48/56 to improve safety and stability with ADLs in standing and reduce risk for falls. (MCID= 8 points)  Baseline: 40 09/23/23:  46/56 Goal status: IN PROGRESS  10.  Patient will report >/= 60% on ABC scale (MCID = 19%) to demonstrate improved balance confidence with functional mobility and gait. Baseline: 39.1  Goal status: INITIAL   PLAN:  PT FREQUENCY: 1-2x/week  PT DURATION: 8 weeks  PLANNED INTERVENTIONS: [id number]- PT Re-evaluation, 97750- Physical Performance Testing, 97110-Therapeutic exercises, 97530- Therapeutic activity, 97112- Neuromuscular re-education, 97535- Self Care, [id number]- Manual therapy, [phone]- Gait training, Patient/Family education, Balance training, Stair training, and Taping  PLAN FOR NEXT SESSION: continue to focus on challenging balance activities working toward more SLS activities  Adelina Collard, PT 09/23/2023, 3:02 PM +

## 2023-09-26 ENCOUNTER — Ambulatory Visit: Admitting: Physician Assistant

## 2023-09-26 ENCOUNTER — Encounter: Payer: Self-pay | Admitting: Family Medicine

## 2023-09-26 ENCOUNTER — Ambulatory Visit (INDEPENDENT_AMBULATORY_CARE_PROVIDER_SITE_OTHER): Admitting: Family Medicine

## 2023-09-26 VITALS — BP 110/70 | HR 69 | Temp 97.9°F | Resp 18 | Ht 67.0 in | Wt 178.0 lb

## 2023-09-26 DIAGNOSIS — R8290 Unspecified abnormal findings in urine: Secondary | ICD-10-CM | POA: Diagnosis not present

## 2023-09-26 DIAGNOSIS — R350 Frequency of micturition: Secondary | ICD-10-CM | POA: Diagnosis not present

## 2023-09-26 LAB — POC URINALSYSI DIPSTICK (AUTOMATED)
Bilirubin, UA: NEGATIVE
Blood, UA: NEGATIVE
Glucose, UA: NEGATIVE
Nitrite, UA: NEGATIVE
Protein, UA: NEGATIVE
Spec Grav, UA: 1.015 (ref 1.010–1.025)
Urobilinogen, UA: 0.2 U/dL
pH, UA: 6 (ref 5.0–8.0)

## 2023-09-26 LAB — COMPREHENSIVE METABOLIC PANEL WITH GFR
ALT: 21 U/L (ref 0–53)
AST: 21 U/L (ref 0–37)
Albumin: 4.2 g/dL (ref 3.5–5.2)
Alkaline Phosphatase: 57 U/L (ref 39–117)
BUN: 18 mg/dL (ref 6–23)
CO2: 35 meq/L — ABNORMAL HIGH (ref 19–32)
Calcium: 9.4 mg/dL (ref 8.4–10.5)
Chloride: 102 meq/L (ref 96–112)
Creatinine, Ser: 1.13 mg/dL (ref 0.40–1.50)
GFR: 58.78 mL/min — ABNORMAL LOW (ref >60.00)
Glucose, Bld: 77 mg/dL (ref 70–99)
Potassium: 4.6 meq/L (ref 3.5–5.1)
Sodium: 142 meq/L (ref 135–145)
Total Bilirubin: 1.1 mg/dL (ref 0.2–1.2)
Total Protein: 6.8 g/dL (ref 6.0–8.3)

## 2023-09-26 LAB — PSA: PSA: 3.03 ng/mL (ref 0.10–4.00)

## 2023-09-26 MED ORDER — CIPROFLOXACIN HCL 500 MG PO TABS: 500.0000 mg | ORAL_TABLET | Freq: Two times a day (BID) | ORAL | 0 refills | Status: AC

## 2023-09-26 NOTE — Progress Notes (Signed)
 Subjective:    Patient ID: Shawn [name], male    DOB: [birth year], 89 y.o.   MRN: [id number]  Chief Complaint  Patient presents with   Back Pain    Lower back, x2 weeks, no falls or injuries.     HPI Patient is in today for low back pain.  Discussed the use of AI scribe software for clinical note transcription with the patient, who gave verbal consent to proceed.  History of Present Illness Shawn [name] is an 89 year old male who presents with lower back pain and increased urinary frequency.  He has been experiencing lower back pain for the past couple of weeks, described as being across the lower back. The pain temporarily subsides with Tylenol  but returns in the evening. There is no radiation to the legs and no associated muscle spasms. He is currently taking Tylenol  to manage the pain.  He reports increased urinary frequency, particularly nocturia, having to get up three times during the night to urinate, which is more frequent than his usual pattern of once per night. He denies any urinary pain or discomfort.  No leg pain, muscle spasms, or stomach pain. He mentions it is hard to get up, attributing this to old age. He is undergoing physical therapy for balance.    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   Generalized anxiety disorder 10/30/2021   Hemorrhoids    Hepatic cyst 11/19/2007   High serum vitamin D  01/12/2020   History of blood clots    History of colon polyps 11/02/2011    hyperplastic   Hyperlipidemia    Impacted cerumen 07/29/2009   Major depressive disorder 10/30/2021   Osteoarthritis    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   Otitis externa 07/07/2021   Overweight 10/05/2010   Pain of left thumb 02/20/2016   Pulmonary embolism (HCC) 11/20/2009   Right groin pain 11/16/2021   Thrombocytopenia 12/01/2009   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    Past Surgical History:  Procedure Laterality Date   APPENDECTOMY     CATARACT EXTRACTION  02/09/2007   right eye   INGUINAL HERNIA REPAIR     INGUINAL HERNIA REPAIR Right 01/05/2022   Procedure: RIGHT HERNIA REPAIR RECURRENT INGUINAL WITH THREE BY SIX POLYPROPYLENE MESH;  Surgeon: Sebastian Moles, MD;  Location: [facility] OR;  Service: General;  Laterality: Right;  GEN AND TAP BLOCK   TONSILLECTOMY     VARICOSE VEIN SURGERY     VASECTOMY      Family History  Problem Relation Age of Onset   Lung cancer Father    Heart attack Mother 53   Heart disease Mother        chf   Arthritis Mother    Colon cancer Neg Hx    Esophageal cancer Neg Hx    Rectal cancer Neg Hx    Stomach cancer Neg Hx     Social History   Socioeconomic  History   Marital status: Married    Spouse name: Not on file   Number of children: 4   Years of education: 12   Highest education level: High school graduate  Occupational History   Occupation: Retired    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    Right handed   One story home    Social Drivers of Health   Financial Resource Strain: Low Risk  (12/25/2022)   Overall Financial Resource Strain (CARDIA)    Difficulty of Paying Living Expenses: Not hard at all  Food Insecurity: No Food Insecurity (12/25/2022)   Hunger Vital Sign    Worried About Running Out of Food in the Last Year: Never true    Ran Out of Food in the Last Year: Never true  Transportation Needs: No Transportation Needs (12/25/2022)   PRAPARE - Administrator, Civil Service (Medical): No    Lack of Transportation (Non-Medical): No  Physical Activity: Insufficiently Active (12/25/2022)   Exercise Vital Sign    Days of Exercise per Week: 5 days    Minutes of Exercise per Session: 20 min  Stress: No Stress Concern Present (12/25/2022)   [employer] of Occupational Health - Occupational Stress Questionnaire    Feeling of Stress : Not at all  Social Connections: Moderately Isolated (12/25/2022)   Social Connection and Isolation Panel    Frequency of Communication with Friends and Family: More than three times a week    Frequency of Social Gatherings with Friends and Family: More than three times a week    Attends Religious Services: Never    Database administrator or Organizations: No    Attends Banker Meetings: Never    Marital Status: Married  Catering manager Violence: Not At Risk (12/25/2022)   Humiliation, Afraid, Rape, and Kick questionnaire    Fear of Current or Ex-Partner: No    Emotionally Abused: No    Physically Abused: No    Sexually Abused: No    Outpatient Medications Prior to Visit  Medication Sig Dispense Refill   ascorbic acid (VITAMIN C) 500 MG tablet Take 500 mg by mouth daily with lunch.     Cholecalciferol 125 MCG (5000 UT) TABS Take 5,000 Units by mouth every Monday, Tuesday, Wednesday, Thursday, and Friday.  With lunch     cyanocobalamin  (VITAMIN B12) 1000 MCG tablet Take 1,000 mcg by mouth daily.     donepezil  (ARICEPT ) 10 MG tablet  Take 1 tablet (10 mg total) by mouth daily. 90 tablet 3   ELIQUIS  5 MG TABS tablet TAKE 1 TABLET BY MOUTH TWICE A DAY 180 tablet 1   ezetimibe  (ZETIA ) 10 MG tablet Take 1 tablet (10 mg total) by mouth daily. 90 tablet 3   memantine  (NAMENDA ) 10 MG tablet Take 1 tablet twice a day 180 tablet 3   Multiple  Vitamin (MULTIVITAMIN WITH MINERALS) TABS tablet Take 1 tablet by mouth daily with lunch.     pantoprazole  (PROTONIX ) 40 MG tablet Take 1 tablet (40 mg total) by mouth daily. 90 tablet 3   saw palmetto  160 MG capsule Take 320 mg by mouth daily with lunch.     sertraline  (ZOLOFT ) 50 MG tablet Take 1 tablet (50 mg total) by mouth daily. 90 tablet 3   No facility-administered medications prior to visit.    Allergies  Allergen Reactions   Crestor  [Rosuvastatin ] Other (See Comments)    disorientation   Niacin Rash    My whole body turned red on the prescription dose He takes the OTC medication   Pravastatin Other (See Comments)    Disorientation    Review of Systems  Constitutional:  Negative for fever and malaise/fatigue.  HENT:  Negative for congestion.   Eyes:  Negative for blurred vision.  Respiratory:  Negative for shortness of breath.   Cardiovascular:  Negative for chest pain, palpitations and leg swelling.  Gastrointestinal:  Negative for abdominal pain, blood in stool and nausea.  Genitourinary:  Positive for frequency. Negative for dysuria.  Musculoskeletal:  Positive for back pain. Negative for falls.  Skin:  Negative for rash.  Neurological:  Negative for dizziness, loss of consciousness and headaches.  Endo/Heme/Allergies:  Negative for environmental allergies.  Psychiatric/Behavioral:  Negative for depression. The patient is not nervous/anxious.        Objective:    Physical Exam Vitals and nursing note reviewed.  Constitutional:      General: He is not in acute distress.    Appearance: Normal appearance. He is well-developed.  HENT:     Head: Normocephalic and  atraumatic.  Eyes:     General: No scleral icterus.       Right eye: No discharge.        Left eye: No discharge.  Cardiovascular:     Rate and Rhythm: Normal rate and regular rhythm.     Heart sounds: No murmur heard. Pulmonary:     Effort: Pulmonary effort is normal. No respiratory distress.     Breath sounds: Normal breath sounds.  Musculoskeletal:        General: Tenderness present. Normal range of motion.     Cervical back: Normal range of motion and neck supple.     Right lower leg: No edema.     Left lower leg: No edema.  Skin:    General: Skin is warm and dry.  Neurological:     Mental Status: He is alert and oriented to person, place, and time.  Psychiatric:        Mood and Affect: Mood normal.        Behavior: Behavior normal.        Thought Content: Thought content normal.        Judgment: Judgment normal.     BP 110/70 (BP Location: Right Arm, Patient Position: Sitting, Cuff Size: Normal)   Pulse 69   Temp 97.9 F (36.6 C) (Oral)   Resp 18   Ht 5' 7 (1.702 m)   Wt 178 lb (80.7 kg)   SpO2 95%   BMI 27.88 kg/m  Wt Readings from Last 3 Encounters:  09/26/23 178 lb (80.7 kg)  08/06/23 174 lb (78.9 kg)  04/26/23 173 lb 6.4 oz (78.7 kg)    Diabetic Foot Exam - Simple   No data filed    Lab Results  Component Value Date   WBC 3.8 (  L) 01/17/2023   HGB 17.0 01/17/2023   HCT 48.7 01/17/2023   PLT 149.0 (L) 01/17/2023   GLUCOSE 94 01/17/2023   CHOL 163 01/17/2023   TRIG 149.0 01/17/2023   HDL 40.20 01/17/2023   LDLDIRECT 86.0 06/17/2014   LDLCALC 93 01/17/2023   ALT 17 01/17/2023   AST 21 01/17/2023   NA 142 01/17/2023   K 4.2 01/17/2023   CL 101 01/17/2023   CREATININE 1.02 01/17/2023   BUN 17 01/17/2023   CO2 33 (H) 01/17/2023   TSH 1.70 01/17/2023   PSA 3.61 01/17/2023   INR 1.1 08/09/2021   HGBA1C 4.8 08/11/2021    Lab Results  Component Value Date   TSH 1.70 01/17/2023   Lab Results  Component Value Date   WBC 3.8 (L) 01/17/2023    HGB 17.0 01/17/2023   HCT 48.7 01/17/2023   MCV 101.2 (H) 01/17/2023   PLT 149.0 (L) 01/17/2023   Lab Results  Component Value Date   NA 142 01/17/2023   K 4.2 01/17/2023   CO2 33 (H) 01/17/2023   GLUCOSE 94 01/17/2023   BUN 17 01/17/2023   CREATININE 1.02 01/17/2023   BILITOT 1.5 (H) 01/17/2023   ALKPHOS 61 01/17/2023   AST 21 01/17/2023   ALT 17 01/17/2023   PROT 6.9 01/17/2023   ALBUMIN 4.3 01/17/2023   CALCIUM  9.4 01/17/2023   ANIONGAP 5 12/21/2021   GFR 66.79 01/17/2023   Lab Results  Component Value Date   CHOL 163 01/17/2023   Lab Results  Component Value Date   HDL 40.20 01/17/2023   Lab Results  Component Value Date   LDLCALC 93 01/17/2023   Lab Results  Component Value Date   TRIG 149.0 01/17/2023   Lab Results  Component Value Date   CHOLHDL 4 01/17/2023   Lab Results  Component Value Date   HGBA1C 4.8 08/11/2021       Assessment & Plan:  Urinary frequency -     POCT Urinalysis Dipstick (Automated) -     PSA -     Comprehensive metabolic panel with GFR -     Ciprofloxacin  HCl; Take 1 tablet (500 mg total) by mouth 2 (two) times daily for 10 days.  Dispense: 20 tablet; Refill: 0  Abnormal urine findings -     Urine Culture  Assessment and Plan Assessment & Plan Low back pain   Chronic low back pain has persisted for a couple of weeks, localized across the lower back. The pain is intermittent, relieved by Tylenol  but returns in the evening. There is no radiation to the legs or associated muscle spasms. Physical examination shows no significant pain on maneuvers.  Urinary frequency   He experiences increased urinary frequency, especially nocturia, needing to urinate three times per night. This symptom is new over the past few weeks. The differential diagnosis includes benign prostatic hyperplasia or urinary tract infection. Further evaluation is necessary to rule out these conditions. Obtain a urine sample for urinalysis to check for urinary  tract infection and order a blood test to evaluate prostate health.    Doral Ventrella R Lowne Chase, DO

## 2023-09-27 ENCOUNTER — Ambulatory Visit: Payer: Self-pay | Admitting: Family Medicine

## 2023-09-27 LAB — URINE CULTURE
MICRO NUMBER:: 16864550
Result:: NO GROWTH
SPECIMEN QUALITY:: ADEQUATE

## 2023-09-30 ENCOUNTER — Encounter: Payer: Self-pay | Admitting: Physician Assistant

## 2023-09-30 ENCOUNTER — Ambulatory Visit (INDEPENDENT_AMBULATORY_CARE_PROVIDER_SITE_OTHER): Admitting: Physician Assistant

## 2023-09-30 ENCOUNTER — Ambulatory Visit: Payer: Self-pay

## 2023-09-30 ENCOUNTER — Encounter: Payer: Self-pay | Admitting: Family Medicine

## 2023-09-30 VITALS — BP 136/74 | HR 56 | Ht 67.0 in | Wt 178.2 lb

## 2023-09-30 DIAGNOSIS — G8929 Other chronic pain: Secondary | ICD-10-CM

## 2023-09-30 DIAGNOSIS — M5441 Lumbago with sciatica, right side: Secondary | ICD-10-CM

## 2023-09-30 MED ORDER — PREDNISONE 20 MG PO TABS: 20.0000 mg | ORAL_TABLET | Freq: Every day | ORAL | 0 refills | Status: DC

## 2023-09-30 MED ORDER — GABAPENTIN 100 MG PO CAPS: 100.0000 mg | ORAL_CAPSULE | Freq: Every evening | ORAL | 0 refills | Status: DC | PRN

## 2023-09-30 NOTE — Telephone Encounter (Signed)
 Appt scheduled

## 2023-09-30 NOTE — Progress Notes (Signed)
 Established patient visit   Patient: Shawn [name]   DOB: 22-Mar-1934   89 y.o. Male  MRN: [id number] Visit Date: 09/30/2023  Today's healthcare provider: Manuelita Flatness, PA-C   Cc. Low back pain  Subjective     Pt was seen 8/21 for low back pain and urinary changes. He was started on Cipro , urine culture was negative. PSA normal.   Discussed the use of AI scribe software for clinical note transcription with the patient, who gave verbal consent to proceed.  History of Present Illness   Shawn [name] is an 89 year old male who presents with worsening back pain radiating to the leg.  He experiences back pain that has progressively worsened and radiates down his right leg, causing tingling and a sensation of the leg 'falling asleep'. The pain became severe overnight, leading him to take extra strength Tylenol , which provided some relief. There is no numbness or changes in bowel or bladder function.   He has balance issues and attends physical therapy.      Medications: Outpatient Medications Prior to Visit  Medication Sig  . ascorbic acid (VITAMIN C) 500 MG tablet Take 500 mg by mouth daily with lunch.  . Cholecalciferol 125 MCG (5000 UT) TABS Take 5,000 Units by mouth every Monday, Tuesday, Wednesday, Thursday, and Friday.  With lunch  . ciprofloxacin  (CIPRO ) 500 MG tablet Take 1 tablet (500 mg total) by mouth 2 (two) times daily for 10 days.  . cyanocobalamin  (VITAMIN B12) 1000 MCG tablet Take 1,000 mcg by mouth daily.  . donepezil  (ARICEPT ) 10 MG tablet Take 1 tablet (10 mg total) by mouth daily.  . ELIQUIS  5 MG TABS tablet TAKE 1 TABLET BY MOUTH TWICE A DAY  . ezetimibe  (ZETIA ) 10 MG tablet Take 1 tablet (10 mg total) by mouth daily.  . memantine  (NAMENDA ) 10 MG tablet Take 1 tablet twice a day  . Multiple Vitamin (MULTIVITAMIN WITH MINERALS) TABS tablet Take 1 tablet by mouth daily with lunch.  . pantoprazole  (PROTONIX ) 40 MG tablet Take 1 tablet  (40 mg total) by mouth daily.  . saw palmetto  160 MG capsule Take 320 mg by mouth daily with lunch. (Patient not taking: Reported on 09/30/2023)  . sertraline  (ZOLOFT ) 50 MG tablet Take 1 tablet (50 mg total) by mouth daily.   No facility-administered medications prior to visit.    Review of Systems  Constitutional:  Negative for fatigue and fever.  Respiratory:  Negative for cough and shortness of breath.   Cardiovascular:  Negative for chest pain, palpitations and leg swelling.  Musculoskeletal:  Positive for back pain.  Neurological:  Negative for dizziness and headaches.       Objective    BP 136/74   Pulse (!) 56   Ht 5' 7 (1.702 m)   Wt 178 lb 3.2 oz (80.8 kg)   BMI 27.91 kg/m    Physical Exam Constitutional:      General: He is awake.     Appearance: He is well-developed.  HENT:     Head: Normocephalic.  Eyes:     Conjunctiva/sclera: Conjunctivae normal.  Cardiovascular:     Rate and Rhythm: Normal rate and regular rhythm.     Heart sounds: Normal heart sounds.  Pulmonary:     Effort: Pulmonary effort is normal.     Breath sounds: Normal breath sounds.  Musculoskeletal:     Comments: Trace bilateral edema  Skin:    General: Skin is  warm.  Neurological:     Mental Status: He is alert and oriented to person, place, and time.  Psychiatric:        Attention and Perception: Attention normal.        Mood and Affect: Mood normal.        Speech: Speech normal.        Behavior: Behavior is cooperative.      No results found for any visits on 09/30/23.  Assessment & Plan    Chronic bilateral low back pain with right-sided sciatica -     predniSONE ; Take 1 tablet (20 mg total) by mouth daily with breakfast.  Dispense: 5 tablet; Refill: 0 -     Gabapentin ; Take 1-2 capsules (100-200 mg total) by mouth at bedtime as needed.  Dispense: 20 capsule; Refill: 0 -     Ambulatory referral to Physical Therapy   - Discontinue ciprofloxacin  as no infection is  present. - rx prednisone  20 mg x 5 days  - Initiate gabapentin  in the evening for pain management, noting potential drowsiness as a side effect. - Communicate with physical therapist to adjust therapy focus on alleviating sciatica symptoms.  - Encourage use of a cane or walker for support to prevent falls.        Return if symptoms worsen or fail to improve.       Manuelita Flatness, PA-C  [facility] [phone] (phone) [phone] (fax)  [facility]

## 2023-09-30 NOTE — Telephone Encounter (Signed)
 FYI Only or Action Required?: FYI only for provider.  Patient was last seen in primary care on 09/26/2023 by [name], [name], DO.  Called Nurse Triage reporting Back Pain.  Symptoms began Friday.  Interventions attempted: Prescription medications: Cipro .  Symptoms are: gradually worsening.  Triage Disposition: See HCP Within 4 Hours (Or PCP Triage)  Patient/caregiver understands and will follow disposition?: yes        Copied from CRM #[id number]. Topic: Clinical - Red Word Triage >> Sep 30, 2023  8:53 AM Laymon HERO wrote: Red Word that prompted transfer to Nurse Triage: Was seen last Thursday for back pain, now the pain going down into legs Reason for Disposition  [1] Pain radiates into the thigh or further down the leg AND [2] both legs  Answer Assessment - Initial Assessment Questions 1. ONSET: When did the pain begin? (e.g., minutes, hours, days)     Friday with shooting pain  2. LOCATION: Where does it hurt? (upper, mid or lower back)     Upper leg  3. SEVERITY: How bad is the pain?  (e.g., Scale 1-10; mild, moderate, or severe)     10 4. PATTERN: Is the pain constant? (e.g., yes, no; constant, intermittent)      *No Answer* 5. RADIATION: Does the pain shoot into your legs or somewhere else?     Right leg  6. CAUSE:  What do you think is causing the back pain?      [place] 8. MEDICINES: What have you taken so far for the pain? (e.g., nothing, acetaminophen , NSAIDS)     Cipro   9. NEUROLOGIC SYMPTOMS: Do you have any weakness, numbness, or problems with bowel/bladder control?     Weakness, numbness 10. OTHER SYMPTOMS: Do you have any other symptoms? (e.g., fever, abdomen pain, burning with urination, blood in urine)       Last Thursday  Protocols used: Back Pain-A-AH

## 2023-10-01 ENCOUNTER — Encounter

## 2023-10-03 ENCOUNTER — Ambulatory Visit: Admitting: Rehabilitation

## 2023-10-03 ENCOUNTER — Encounter: Admitting: Rehabilitation

## 2023-10-03 DIAGNOSIS — R269 Unspecified abnormalities of gait and mobility: Secondary | ICD-10-CM | POA: Diagnosis not present

## 2023-10-03 DIAGNOSIS — M6281 Muscle weakness (generalized): Secondary | ICD-10-CM

## 2023-10-03 DIAGNOSIS — M5459 Other low back pain: Secondary | ICD-10-CM

## 2023-10-03 NOTE — Therapy (Signed)
 OUTPATIENT PHYSICAL THERAPY THORACOLUMBAR EVALUATION   Patient Name: Shawn [name] MRN: [id number] DOB:Jul 03, 1934, 89 y.o., male Today's Date: 10/07/2023  END OF SESSION:    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   Generalized anxiety disorder 10/30/2021   Hemorrhoids    Hepatic cyst 11/19/2007   High serum vitamin D  01/12/2020   History of blood clots    History of colon polyps 11/02/2011   hyperplastic   Hyperlipidemia    Impacted cerumen 07/29/2009   Major depressive disorder 10/30/2021   Osteoarthritis    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   Otitis externa 07/07/2021   Overweight 10/05/2010   Pain of left thumb 02/20/2016   Pulmonary embolism (HCC) 11/20/2009   Right groin pain 11/16/2021   Thrombocytopenia 12/01/2009   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   Past Surgical History:  Procedure Laterality Date   APPENDECTOMY     CATARACT EXTRACTION  02/09/2007   right eye   INGUINAL HERNIA REPAIR     INGUINAL HERNIA REPAIR Right 01/05/2022   Procedure: RIGHT HERNIA REPAIR RECURRENT INGUINAL WITH THREE BY SIX POLYPROPYLENE MESH;  Surgeon: Shawn Moles, MD;  Location: [facility] OR;  Service: General;  Laterality: Right;  GEN AND TAP BLOCK   TONSILLECTOMY     VARICOSE VEIN SURGERY     VASECTOMY     Patient Active Problem List   Diagnosis Date Noted   Family history of early CAD 04/26/2023   Essential hypertension 04/26/2023   Major depressive disorder 10/30/2021    Generalized anxiety disorder 10/30/2021   Ataxia of right upper extremity 08/09/2021   Otitis externa 07/07/2021   Dementia due to Alzheimer's disease 03/18/2020   High serum vitamin D  01/12/2020   Pain of left thumb 02/20/2016   History of colon polyps 11/02/2011   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   Overweight 10/05/2010   Backache 04/21/2010   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   Thrombocytopenia 12/01/2009   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   Aortic valve disorder 11/19/2007   Benign neoplasm of skin 08/11/2007   Hyperlipidemia 09/04/2006   Osteoarthritis 09/04/2006   Dysphagia 09/04/2006    PCP: Shawn Cyndee Jamee JONELLE, DO   REFERRING PROVIDER: Dina Camie BRAVO, PA-C   REFERRING DIAG: G89.29,M54.41 (ICD-10-CM) - Chronic bilateral low back pain with right-sided sciatica   THERAPY DIAG:  Other low back pain - Plan: PT plan of care cert/re-cert, CANCELED: PT plan of care cert/re-cert  Muscle weakness (generalized) - Plan: PT plan of care cert/re-cert, CANCELED: PT plan of care cert/re-cert  RATIONALE FOR EVALUATION AND TREATMENT: Rehabilitation  ONSET DATE: ?  Recent months per patient  NEXT MD VISIT:    SUBJECTIVE:  SUBJECTIVE STATEMENT: Patient is has a new referral from PCP for LBP with R sided sciatica.  He has been seeing us  for unsteady gait and falls for the last month.  However, he saw PCP this week for LBP and was given gabapentin  and prednisone  with a new PT referral.  He states he has been taking the meds and is not really having any pain today.  However, he reports he would rather focus treatment going forward on his LBP and R hip pain.   He has dementia so his description of the pain history and pain itself is rather unreliable.   He states he has had the pain for a few months and can't attribute it to any injuries.   He has difficulty providing any factors that improve or worsen his pain.  He says that it was running down into his R hip and posterior thigh before he started the new meds, though.  He states it doesn't interrupt his sleep but bothers him throughout the day.    PAIN: Are you having pain? Yes: NPRS scale: wong-baker faces;  0/10 now; 7/10 worst over the last week Pain location: low back and R hip/posterior thigh Pain description: aching Aggravating factors: can't give any Relieving factors: meds  PERTINENT HISTORY:  dementia, blood clotting disorder (Factor V), h/o DVT and PE, ataxia, LBP, moderate aortic sclerosis  PRECAUTIONS: Fall  RED FLAGS: None  WEIGHT BEARING RESTRICTIONS: No  FALLS:  Has patient fallen in last 6 months? 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  PLOF: Independent with gait  PATIENT GOALS: feel better with my back   OBJECTIVE: (objective measures completed at initial evaluation unless otherwise dated)  DIAGNOSTIC FINDINGS:    PATIENT SURVEYS:  Patient can't reliably answer any surverys  SCREENING FOR RED FLAGS: Bowel or bladder incontinence: No Spinal tumors: No Cauda equina syndrome: No Compression fracture: No Abdominal aneurysm: No  COGNITION:  Overall cognitive status: Impaired    SENSATION: WFL  POSTURE:  rounded shoulders, forward head, and decreased lumbar lordosis  PALPATION: Unable to palpate any areas of pain today with deep pressure to his low back, pelvis, hips, and with vertebral P/A glides from T7 to L5  LUMBAR ROM:    Active  Eval  Flexion To knees; p!  Extension 30%  Right lateral flexion To mid thigh  Left lateral flexion To mid thigh  Right rotation 50%  Left rotation 50%  (Blank rows = not tested)  MUSCLE LENGTH: Hamstrings: Right SLR 40 deg; Left SLR 50 deg Thomas test: Right 10 deg; Left NT  Hamstrings: 90/90 is 45 deg RLE;  50 deg LLE ITB: NT Piriformis: moderately stiff R, mildly stiff LLE Hip flexors: minimal tightness Quads: NT Heelcord: NT  LOWER EXTREMITY ROM:     Passive  Right eval Left eval  Hip flexion    Hip extension    Hip abduction    Hip adduction    Hip internal rotation 18 24  Hip external rotation 33 39  Knee flexion    Knee extension    Ankle dorsiflexion    Ankle plantarflexion    Ankle inversion    Ankle eversion    (Blank rows =  not tested)  LOWER EXTREMITY MMT:    MMT Right eval Left eval  Hip flexion 4 4  Hip extension 3+ 3+  Hip abduction 4- 4-  Hip adduction    Hip internal rotation 4- 4  Hip external rotation 4 4+  Knee flexion 4 4  Knee extension 5 5  Ankle dorsiflexion 4 4  Ankle plantarflexion    Ankle inversion    Ankle eversion     (Blank rows = not tested)  LUMBAR SPECIAL TESTS:  Straight leg raise test: Negative, Slump test: Negative, SI Compression/distraction test: Negative, and FABER test: Negative  FUNCTIONAL TESTS:  See initial eval and prior notes for functional test results  GAIT: Distance walked: into clinic from parking lot Assistive device utilized: None Level of assistance: CGA Gait pattern: decreased arm swing- Right, decreased arm swing- Left, decreased step length- Right, decreased step length- Left, decreased stance time- Right, decreased stance time- Left, decreased stride length, decreased ankle dorsiflexion- Right, and decreased ankle dorsiflexion- Left Comments: He ambulates with no device with unsteadiness, short shuffling gait pattern with no trunk or arm movement and very flat back, rounded  shoulders   TODAY'S TREATMENT:  10/03/23 SELF CARE: Provided education on PT POC progression, to improve safety with use of SPC for assistive device, to reduce fall risk, and to promote safe home environment.  PATIENT EDUCATION:  Education details: PT eval findings, anticipated POC, and initial HEP  Person educated: Patient Education method: Explanation, Demonstration, Verbal cues, Tactile cues, and Handouts Education comprehension: verbalized understanding, verbal cues required, tactile cues required, and needs further education  HOME EXERCISE PROGRAM: Access Code: [email or web address] URL: [email or web address] Date: 10/03/2023 Prepared by: Garnette Montclair  Exercises - Seated Hamstring Stretch  - 1 x daily - 7 x weekly - 1 sets - 1 reps - 1 min hold - Standing Lumbar Extension with Counter  - 1 x daily - 7 x weekly - 1 sets - 10 reps - Supine Lower Trunk Rotation  - 1 x daily - 7 x weekly - 1 sets - 10 reps - Seated Figure 4 Piriformis Stretch  - 1 x daily - 7 x weekly - 1 sets - 1 reps - 1 min hold - Seated Piriformis Stretch  - 1 x daily - 7 x weekly - 1 sets - 1 reps - 1 min hold - Supine Bridge  - 1 x daily - 7 x weekly - 3 sets - 10 reps   ASSESSMENT:  CLINICAL IMPRESSION: Shawn [name] is a 89 y.o. male who was referred to physical therapy for evaluation and treatment for LBP with R sided sciatica.   Patient reports onset of LBP/R hip pain beginning in last few months.    However, is a poor historian due to his forgetfulness so it is unclear how long he has actually had back pain.   He doesn't know of anything that makes it better or worse, but states it feels better since he recently started his new meds from PCP.  He is extremely stiff in his back and legs with very poor hamstring flexibility.  He has a flattened lordosis.  It doesn't appear he has any recent imaging.  I can't reproduce his symptoms today likely due to the prdnisone/gabapentin  helping his  pain.  However, I think his ambulation and posture which tends to be in hip and back flexion is a big contributor to his problem.   He has already been seeing us  for balance training due to  falls and we have recommended he use a cane many times.   However, he is recalcitrant to the idea.  Cane for ambulation is again encouraged today and I think it would help his back pain if he would agree to use one.  We will continue to push this idea with him.  His dementia is a barrier, though.    Patient has deficits in lumbar ROM, B LE flexibility, B strength, abnormal posture, and LBP/RLE pain which are interfering with ADLs and are impacting quality of life.  ODI is not completed due to memory loss and difficulty accurately answering the questionnaire.   Yue will benefit from skilled PT to address above deficits to improve mobility and activity tolerance with decreased pain interference.     OBJECTIVE IMPAIRMENTS: Abnormal gait, decreased balance, difficulty walking, decreased ROM, decreased strength, decreased safety awareness, impaired perceived functional ability, impaired flexibility, improper body mechanics, postural dysfunction, and pain.   ACTIVITY LIMITATIONS: carrying, lifting, bending, stairs, and locomotion level  PARTICIPATION LIMITATIONS: community activity  PERSONAL FACTORS: Age and 1-2 comorbidities:   dementia, blood clotting disorder (Factor V), h/o DVT and PE, ataxia, LBP, moderate aortic sclerosis are also affecting patient's functional outcome.   REHAB POTENTIAL: Good  CLINICAL DECISION MAKING: Evolving/moderate complexity  EVALUATION COMPLEXITY: Moderate   GOALS: Goals reviewed with patient? Yes  SHORT TERM GOALS: Target date: 10/24/2023   Patient will be independent with initial HEP to improve outcomes and carryover.  Baseline: 100% PT assist required for correct completion Goal status: INITIAL  2.  Patient will report 25% improvement in low back pain to improve  QOL. Baseline: variable pain day to day with 7/10 worst Goal status: INITIAL  LONG TERM GOALS: Target date: 11/14/2023   Patient will be independent with ongoing/advanced HEP for self-management at home.  Baseline: no advanced lumbar HEP yet Goal status: INITIAL  2.  Patient will report 50-75% improvement in low back pain to improve QOL.  Baseline: 7/10 worst Goal status: INITIAL  3.  Patient to demonstrate ability to achieve and maintain good spinal alignment/posturing and body mechanics needed for daily activities. Baseline: flat back, flexed hips in standing with forward head Goal status: INITIAL  4.  Patient will demonstrate increased lumbar ROM  by 10-20% in all planes to perform ADLs.   Baseline: Refer to above lumbar ROM table Goal status: INITIAL  5.  Patient will demonstrate improved BLE strength to >/= 4+/5 for improved stability and ease of mobility. Baseline: Refer to above LE MMT table Goal status: INITIAL  7.  Patient will tolerate 20-30 min of (standing/sitting/walking) w/o increased pain to allow for  improved mobility and activity tolerance and be able to go shopping with his spouse Baseline: able to access community with spouse supervision/assistance but in pain Goal status: INITIAL PLAN:  PT FREQUENCY: 1-2x/week  PT DURATION: 6 weeks  PLANNED INTERVENTIONS: 97164- PT Re-evaluation, 97750- Physical Performance Testing, 97110-Therapeutic exercises, 97530- Therapeutic activity, [id number]- Neuromuscular re-education, 97535- Self Care, [id number]- Manual therapy, [id number]- Gait training, [id number]- Electrical stimulation (unattended), 97016- Vasopneumatic device, [id number]- Ultrasound, [id number]- Traction (mechanical), [id number]- Ionotophoresis 4mg /ml Dexamethasone , Taping, Joint mobilization, Spinal mobilization, Cryotherapy, and Moist heat  PLAN FOR NEXT SESSION: Focus treatment on low back pain; Review HEP stretching, add some strengthening exercises; assess how HEP affected him (if he was  able to do it with spouse assist)   Devonta Blanford, PT 10/07/2023, 1:43 PM

## 2023-10-08 ENCOUNTER — Ambulatory Visit: Attending: Physician Assistant | Admitting: Rehabilitation

## 2023-10-08 ENCOUNTER — Telehealth: Payer: Self-pay | Admitting: Rehabilitation

## 2023-10-08 DIAGNOSIS — R296 Repeated falls: Secondary | ICD-10-CM | POA: Insufficient documentation

## 2023-10-08 DIAGNOSIS — R2681 Unsteadiness on feet: Secondary | ICD-10-CM | POA: Insufficient documentation

## 2023-10-08 DIAGNOSIS — H8113 Benign paroxysmal vertigo, bilateral: Secondary | ICD-10-CM | POA: Insufficient documentation

## 2023-10-08 DIAGNOSIS — R269 Unspecified abnormalities of gait and mobility: Secondary | ICD-10-CM | POA: Insufficient documentation

## 2023-10-08 DIAGNOSIS — M6281 Muscle weakness (generalized): Secondary | ICD-10-CM | POA: Insufficient documentation

## 2023-10-08 DIAGNOSIS — M5459 Other low back pain: Secondary | ICD-10-CM | POA: Insufficient documentation

## 2023-10-08 NOTE — Telephone Encounter (Signed)
 Called and spoke with patient's spouse regarding his missed appointment for today.  She states she didn't have today's visit written down and so she forgot.  She is apologetic and states will see us  on Thursday for next scheduled visit.

## 2023-10-10 ENCOUNTER — Ambulatory Visit

## 2023-10-10 DIAGNOSIS — R269 Unspecified abnormalities of gait and mobility: Secondary | ICD-10-CM

## 2023-10-10 DIAGNOSIS — R296 Repeated falls: Secondary | ICD-10-CM

## 2023-10-10 DIAGNOSIS — M5459 Other low back pain: Secondary | ICD-10-CM | POA: Diagnosis not present

## 2023-10-10 DIAGNOSIS — R2681 Unsteadiness on feet: Secondary | ICD-10-CM | POA: Diagnosis not present

## 2023-10-10 DIAGNOSIS — M6281 Muscle weakness (generalized): Secondary | ICD-10-CM

## 2023-10-10 DIAGNOSIS — H8113 Benign paroxysmal vertigo, bilateral: Secondary | ICD-10-CM | POA: Diagnosis not present

## 2023-10-10 NOTE — Therapy (Signed)
 OUTPATIENT PHYSICAL THERAPY THORACOLUMBAR EVALUATION   Patient Name: Shawn [name] MRN: [id number] DOB:April 14, 1934, 88 y.o., male Today's Date: 10/10/2023  END OF SESSION:  PT End of Session - 10/10/23 1222     Visit Number 12    Date for PT Re-Evaluation 10/17/23    Authorization Type aetna medicare    PT Start Time 1146    PT Stop Time 1225    PT Time Calculation (min) 39 min    Activity Tolerance Patient tolerated treatment well;No increased pain    Behavior During Therapy [facility] for tasks assessed/performed           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   Generalized anxiety disorder 10/30/2021   Hemorrhoids    Hepatic cyst 11/19/2007   High serum vitamin D  01/12/2020   History of blood clots    History of colon polyps 11/02/2011   hyperplastic   Hyperlipidemia    Impacted cerumen 07/29/2009   Major depressive disorder 10/30/2021   Osteoarthritis    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   Otitis externa 07/07/2021   Overweight 10/05/2010   Pain of left thumb 02/20/2016   Pulmonary embolism (HCC) 11/20/2009   Right groin pain 11/16/2021   Thrombocytopenia 12/01/2009   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   Past Surgical History:  Procedure Laterality Date   APPENDECTOMY     CATARACT EXTRACTION  02/09/2007   right eye   INGUINAL HERNIA REPAIR     INGUINAL HERNIA REPAIR Right 01/05/2022   Procedure: RIGHT HERNIA REPAIR RECURRENT INGUINAL WITH THREE BY SIX  POLYPROPYLENE MESH;  Surgeon: Sebastian Moles, MD;  Location: [facility] OR;  Service: General;  Laterality: Right;  GEN AND TAP BLOCK   TONSILLECTOMY     VARICOSE VEIN SURGERY     VASECTOMY     Patient Active Problem List   Diagnosis Date Noted   Family history of early CAD 04/26/2023   Essential hypertension 04/26/2023   Major depressive disorder 10/30/2021   Generalized anxiety disorder 10/30/2021   Ataxia of right upper extremity 08/09/2021   Otitis externa 07/07/2021   Dementia due to Alzheimer's disease 03/18/2020   High serum vitamin D  01/12/2020   Pain of left thumb 02/20/2016   History of colon polyps 11/02/2011   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   Overweight 10/05/2010   Backache 04/21/2010   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   Thrombocytopenia 12/01/2009   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   Aortic valve disorder 11/19/2007   Benign neoplasm of skin 08/11/2007   Hyperlipidemia 09/04/2006   Osteoarthritis 09/04/2006   Dysphagia 09/04/2006    PCP: Antonio Cyndee Jamee JONELLE, DO   REFERRING PROVIDER: Dina Camie BRAVO, PA-C   REFERRING DIAG: G89.29,M54.41 (ICD-10-CM) - Chronic bilateral low back pain with right-sided sciatica   THERAPY DIAG:  Other low back pain  Muscle weakness (generalized)  Gait abnormality  Unsteadiness on feet  Repeated falls  RATIONALE FOR EVALUATION AND TREATMENT:  Rehabilitation  ONSET DATE: ?  Recent months per patient  NEXT MD VISIT:    SUBJECTIVE:                                                                                                                                                                                                         SUBJECTIVE STATEMENT: Patient is has a new referral from PCP for LBP with R sided sciatica.  He has been seeing us  for unsteady gait and falls for the last month.  However, he saw PCP this week for LBP and was given gabapentin  and prednisone  with a new PT  referral.  He states he has been taking the meds and is not really having any pain today.  However, he reports he would rather focus treatment going forward on his LBP and R hip pain.   He has dementia so his description of the pain history and pain itself is rather unreliable.  He states he has had the pain for a few months and can't attribute it to any injuries.   He has difficulty providing any factors that improve or worsen his pain.  He says that it was running down into his R hip and posterior thigh before he started the new meds, though.  He states it doesn't interrupt his sleep but bothers him throughout the day.    PAIN: Are you having pain? Yes: NPRS scale: wong-baker faces;  0/10 now; 7/10 worst over the last week Pain location: low back and R hip/posterior thigh Pain description: aching Aggravating factors: can't give any Relieving factors: meds  PERTINENT HISTORY:  dementia, blood clotting disorder (Factor V), h/o DVT and PE, ataxia, LBP, moderate aortic sclerosis  PRECAUTIONS: Fall  RED FLAGS: None  WEIGHT BEARING RESTRICTIONS: No  FALLS:  Has patient fallen in last 6 months? 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  PLOF: Independent with gait  PATIENT GOALS: feel better with my back   OBJECTIVE: (objective measures completed at initial evaluation unless otherwise dated)  DIAGNOSTIC FINDINGS:    PATIENT SURVEYS:  Patient can't reliably answer any surverys  SCREENING FOR RED FLAGS: Bowel or bladder incontinence: No Spinal tumors: No Cauda equina syndrome: No Compression fracture: No Abdominal aneurysm: No  COGNITION:  Overall cognitive  status:  Impaired    SENSATION: WFL  POSTURE:  rounded shoulders, forward head, and decreased lumbar lordosis  PALPATION: Unable to palpate any areas of pain today with deep pressure to his low back, pelvis, hips, and with vertebral P/A glides from T7 to L5  LUMBAR ROM:   Active  Eval  Flexion To knees; p!  Extension 30%  Right lateral flexion To mid thigh  Left lateral flexion To mid thigh  Right rotation 50%  Left rotation 50%  (Blank rows = not tested)  MUSCLE LENGTH: Hamstrings: Right SLR 40 deg; Left SLR 50 deg Thomas test: Right 10 deg; Left NT  Hamstrings: 90/90 is 45 deg RLE;  50 deg LLE ITB: NT Piriformis: moderately stiff R, mildly stiff LLE Hip flexors: minimal tightness Quads: NT Heelcord: NT  LOWER EXTREMITY ROM:     Passive  Right eval Left eval  Hip flexion    Hip extension    Hip abduction    Hip adduction    Hip internal rotation 18 24  Hip external rotation 33 39  Knee flexion    Knee extension    Ankle dorsiflexion    Ankle plantarflexion    Ankle inversion    Ankle eversion    (Blank rows = not tested)  LOWER EXTREMITY MMT:    MMT Right eval Left eval  Hip flexion 4 4  Hip extension 3+ 3+  Hip abduction 4- 4-  Hip adduction    Hip internal rotation 4- 4  Hip external rotation 4 4+  Knee flexion 4 4  Knee extension 5 5  Ankle dorsiflexion 4 4  Ankle plantarflexion    Ankle inversion    Ankle eversion     (Blank rows = not tested)  LUMBAR SPECIAL TESTS:  Straight leg raise test: Negative, Slump test: Negative, SI Compression/distraction test: Negative, and FABER test: Negative  FUNCTIONAL TESTS:  See initial eval and prior notes for functional test results  GAIT: Distance walked: into clinic from parking lot Assistive device utilized: None Level of assistance: CGA Gait pattern: decreased arm swing- Right, decreased arm swing- Left, decreased step length- Right, decreased step length- Left, decreased stance time- Right, decreased  stance time- Left, decreased stride length, decreased ankle dorsiflexion- Right, and decreased ankle dorsiflexion- Left Comments: He ambulates with no device with unsteadiness, short shuffling gait pattern with no trunk or arm movement and very flat back, rounded shoulders   TODAY'S TREATMENT:  10/10/23 Bike L4x24min Seated hamstring stretch x 1 min B Seated piriformis stretch fig 4- 2 x 1 min B Standing lumbar extension back to counter 10x3 Leg curls 20lb 2x10 BLE Leg ext 10lb 2x10 BLE Supine LTR x 10 each way Bridging x 20 Clamshells supine GTB with TrA x 20  10/03/23 SELF CARE: Provided education on PT POC progression, to improve safety with use of SPC for assistive device, to reduce fall risk, and to promote safe home environment.  PATIENT EDUCATION:  Education details: PT eval findings, anticipated POC, and initial HEP  Person educated: Patient Education method: Explanation, Demonstration, Verbal cues, Tactile cues, and Handouts Education comprehension: verbalized understanding, verbal cues required, tactile cues required, and needs further education  HOME EXERCISE PROGRAM: Access Code: [email or web address] URL: [email or web address] Date: 10/03/2023 Prepared by: Garnette Montclair  Exercises - Seated Hamstring Stretch  - 1 x daily - 7 x weekly - 1 sets - 1 reps - 1 min hold - Standing Lumbar Extension with Counter  - 1 x daily - 7 x  weekly - 1 sets - 10 reps - Supine Lower Trunk Rotation  - 1 x daily - 7 x weekly - 1 sets - 10 reps - Seated Figure 4 Piriformis Stretch  - 1 x daily - 7 x weekly - 1 sets - 1 reps - 1 min hold - Seated Piriformis Stretch  - 1 x daily - 7 x weekly - 1 sets - 1 reps - 1 min hold - Supine Bridge  - 1 x daily - 7 x weekly - 3 sets - 10 reps   ASSESSMENT:  CLINICAL IMPRESSION: Treatment focused on reducing LBP, improving LE strength, and flexibility. Pt with no complaints throughout session other than some R LE pain with the piriformis stretch.  He did not feel much stretch with the KTOS stretch so I took that out of the home program. Cues provided as needed with the interventions.  Curt will benefit from skilled PT to address above deficits to improve mobility and activity tolerance with decreased pain interference.     OBJECTIVE IMPAIRMENTS: Abnormal gait, decreased balance, difficulty walking, decreased ROM, decreased strength, decreased safety awareness, impaired perceived functional ability, impaired flexibility, improper body mechanics, postural dysfunction, and pain.   ACTIVITY LIMITATIONS: carrying, lifting, bending, stairs, and locomotion level  PARTICIPATION LIMITATIONS: community activity  PERSONAL FACTORS: Age and 1-2 comorbidities:   dementia, blood clotting disorder (Factor V), h/o DVT and PE, ataxia, LBP, moderate aortic sclerosis are also affecting patient's functional outcome.   REHAB POTENTIAL: Good  CLINICAL DECISION MAKING: Evolving/moderate complexity  EVALUATION COMPLEXITY: Moderate   GOALS: Goals reviewed with patient? Yes  SHORT TERM GOALS: Target date: 10/24/2023   Patient will be independent with initial HEP to improve outcomes and carryover.  Baseline: 100% PT assist required for correct completion Goal status: INITIAL  2.  Patient will report 25% improvement in low back pain to improve QOL. Baseline: variable pain day to day with 7/10 worst Goal status: INITIAL  LONG TERM GOALS: Target date: 11/14/2023   Patient will be independent with ongoing/advanced HEP for self-management at home.  Baseline: no advanced lumbar HEP yet Goal status: INITIAL  2.  Patient will report 50-75% improvement in low back pain to improve QOL.  Baseline: 7/10 worst Goal status: INITIAL  3.  Patient to demonstrate ability to achieve and maintain good spinal alignment/posturing and body mechanics needed for daily activities. Baseline: flat back, flexed hips in standing with forward head Goal status:  INITIAL  4.  Patient will demonstrate increased lumbar ROM  by 10-20% in all planes to perform ADLs.   Baseline: Refer to above lumbar ROM table Goal status: INITIAL  5.  Patient will demonstrate improved BLE strength to >/= 4+/5 for improved stability and ease of mobility. Baseline: Refer to above LE MMT table Goal status: INITIAL  7.  Patient will tolerate 20-30 min of (standing/sitting/walking) w/o increased pain to allow for  improved mobility and activity tolerance and be able to go shopping with his spouse Baseline: able to access community with spouse supervision/assistance but in pain Goal status: INITIAL PLAN:  PT FREQUENCY: 1-2x/week  PT DURATION: 6 weeks  PLANNED INTERVENTIONS: 97164- PT Re-evaluation, 97750- Physical Performance Testing, 97110-Therapeutic exercises, 97530- Therapeutic activity, [id number]- Neuromuscular re-education, 97535- Self Care, [id number]- Manual therapy, [id number]- Gait training, H9716- Electrical stimulation (unattended), 97016- Vasopneumatic device, [id number]- Ultrasound, [id number]- Traction (mechanical), [id number]- Ionotophoresis 4mg /ml Dexamethasone , Taping, Joint mobilization, Spinal mobilization, Cryotherapy, and Moist heat  PLAN FOR NEXT SESSION: Focus treatment on low back pain; Review HEP  stretching, add some strengthening exercises; assess how HEP affected him (if he was able to do it with spouse assist)   Sol LITTIE Gaskins, PTA 10/10/2023, 12:28 PM

## 2023-10-14 ENCOUNTER — Ambulatory Visit (INDEPENDENT_AMBULATORY_CARE_PROVIDER_SITE_OTHER): Admitting: Family

## 2023-10-14 ENCOUNTER — Ambulatory Visit: Payer: Self-pay

## 2023-10-14 ENCOUNTER — Ambulatory Visit: Admitting: Rehabilitation

## 2023-10-14 VITALS — BP 134/67 | HR 65 | Temp 97.8°F | Resp 16 | Ht 67.0 in | Wt 176.0 lb

## 2023-10-14 VITALS — BP 120/65 | HR 60 | Resp 18

## 2023-10-14 DIAGNOSIS — R296 Repeated falls: Secondary | ICD-10-CM | POA: Diagnosis not present

## 2023-10-14 DIAGNOSIS — R202 Paresthesia of skin: Secondary | ICD-10-CM | POA: Diagnosis not present

## 2023-10-14 DIAGNOSIS — R42 Dizziness and giddiness: Secondary | ICD-10-CM | POA: Diagnosis not present

## 2023-10-14 DIAGNOSIS — R269 Unspecified abnormalities of gait and mobility: Secondary | ICD-10-CM | POA: Diagnosis not present

## 2023-10-14 DIAGNOSIS — R531 Weakness: Secondary | ICD-10-CM | POA: Diagnosis not present

## 2023-10-14 DIAGNOSIS — I359 Nonrheumatic aortic valve disorder, unspecified: Secondary | ICD-10-CM

## 2023-10-14 DIAGNOSIS — R2681 Unsteadiness on feet: Secondary | ICD-10-CM | POA: Diagnosis not present

## 2023-10-14 DIAGNOSIS — H8113 Benign paroxysmal vertigo, bilateral: Secondary | ICD-10-CM | POA: Diagnosis not present

## 2023-10-14 DIAGNOSIS — M5459 Other low back pain: Secondary | ICD-10-CM | POA: Diagnosis not present

## 2023-10-14 DIAGNOSIS — M6281 Muscle weakness (generalized): Secondary | ICD-10-CM

## 2023-10-14 DIAGNOSIS — M545 Low back pain, unspecified: Secondary | ICD-10-CM

## 2023-10-14 NOTE — Progress Notes (Signed)
 Subjective:     Patient ID: Shawn [name], male    DOB: 1934/11/13, 89 y.o.   MRN: [id number]  Chief Complaint  Patient presents with   Dizziness    Patient complains of feeling light headed for about one week   Tingling    Patient complains of tingling on both legs    Dizziness    Discussed the use of AI scribe software for clinical note transcription with the patient, who gave verbal consent to proceed.  History of Present Illness  Shawn [name] is an 89 year old male with hx of demenia who is brought here today by his wife.  He presents with persistent lightheadedness and balance issues. He experiences persistent lightheadedness and a sensation of 'falling forward' for about a year. An episode of syncope occurred while on gabapentin , which he discontinued on Saturday. Despite stopping gabapentin , he continues to feel lightheaded and woozy, with symptoms slightly improving yesterday but returning to previous severity today. He has tingling sensations in his legs, described as 'pins and needles,' affecting the entire length of his legs. He has difficulty walking straight and feels very tired. He takes Tylenol  for back pain, which is effective, especially at night, but sometimes requires an additional dose during the day. He notices a tremor in his right hand, particularly when picking up a cup. He sees a neurologist twice a year and is on B12 supplements at a dose of 500 mcg. He attends physical therapy and just came from a session today. After stopping gabapentin , he felt better for one day before symptoms returned.      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   Generalized anxiety disorder 10/30/2021   Hemorrhoids    Hepatic cyst 11/19/2007   High serum vitamin D  01/12/2020   History of blood clots    History of colon polyps 11/02/2011   hyperplastic   Hyperlipidemia    Impacted cerumen 07/29/2009   Major depressive disorder 10/30/2021   Osteoarthritis    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   Otitis externa 07/07/2021   Overweight 10/05/2010   Pain of left thumb 02/20/2016   Pulmonary embolism (HCC) 11/20/2009   Right groin pain 11/16/2021   Thrombocytopenia 12/01/2009   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    Past Surgical History:  Procedure Laterality Date   APPENDECTOMY     CATARACT EXTRACTION  02/09/2007   right eye   INGUINAL HERNIA REPAIR     INGUINAL HERNIA REPAIR Right 01/05/2022   Procedure: RIGHT HERNIA REPAIR RECURRENT INGUINAL WITH THREE BY SIX POLYPROPYLENE MESH;  Surgeon: Sebastian Moles, MD;  Location: [facility] OR;  Service: General;  Laterality: Right;  GEN AND TAP BLOCK   TONSILLECTOMY     VARICOSE VEIN SURGERY     VASECTOMY  Family History  Problem Relation Age of Onset   Lung cancer Father    Heart attack Mother 110   Heart disease Mother        chf   Arthritis Mother    Colon cancer Neg Hx    Esophageal cancer Neg Hx    Rectal cancer Neg Hx    Stomach cancer Neg Hx     Social History   Socioeconomic History   Marital status: Married    Spouse name: Not on file   Number of children: 4   Years of education: 12   Highest education level: High school graduate  Occupational History   Occupation: 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    Right handed   One story home   Social Drivers of Health   Financial Resource Strain: Low Risk  (12/25/2022)   Overall Financial Resource Strain (CARDIA)    Difficulty of Paying Living Expenses: Not hard at all  Food Insecurity: No Food Insecurity (12/25/2022)   Hunger Vital Sign    Worried About Running Out of Food in the Last Year: Never true    Ran Out of Food in the Last Year: Never true  Transportation Needs: No Transportation Needs (12/25/2022)   PRAPARE - Administrator, Civil Service (Medical): No    Lack of Transportation (Non-Medical): No  Physical Activity: Insufficiently Active (12/25/2022)   Exercise Vital Sign    Days of Exercise per Week: 5 days    Minutes of Exercise per Session: 20 min  Stress: No Stress Concern Present (12/25/2022)   [employer] of Occupational Health - Occupational Stress Questionnaire    Feeling of Stress : Not at all  Social Connections: Moderately Isolated (12/25/2022)   Social Connection and Isolation Panel    Frequency of Communication with Friends and Family: More than three times a week    Frequency of Social Gatherings with Friends and Family: More than three times a week    Attends Religious Services: Never    Database administrator or Organizations: No    Attends Banker Meetings: Never    Marital Status: Married  Catering manager Violence: Not At Risk (12/25/2022)   Humiliation, Afraid, Rape, and Kick questionnaire    Fear of Current or Ex-Partner: No     Emotionally Abused: No    Physically Abused: No    Sexually Abused: No    Outpatient Medications Prior to Visit  Medication Sig Dispense Refill   ascorbic acid (VITAMIN C) 500 MG tablet Take 500 mg by mouth daily with lunch.     Cholecalciferol 125 MCG (5000 UT) TABS Take 5,000 Units by mouth every Monday, Tuesday, Wednesday, Thursday, and Friday.  With lunch     cyanocobalamin  (VITAMIN B12) 1000 MCG tablet Take 1,000 mcg by mouth daily.  donepezil  (ARICEPT ) 10 MG tablet Take 1 tablet (10 mg total) by mouth daily. 90 tablet 3   ELIQUIS  5 MG TABS tablet TAKE 1 TABLET BY MOUTH TWICE A DAY 180 tablet 1   ezetimibe  (ZETIA ) 10 MG tablet Take 1 tablet (10 mg total) by mouth daily. 90 tablet 3   gabapentin  (NEURONTIN ) 100 MG capsule Take 1-2 capsules (100-200 mg total) by mouth at bedtime as needed. (Patient not taking: Reported on 10/14/2023) 20 capsule 0   memantine  (NAMENDA ) 10 MG tablet Take 1 tablet twice a day 180 tablet 3   Multiple Vitamin (MULTIVITAMIN WITH MINERALS) TABS tablet Take 1 tablet by mouth daily with lunch.     pantoprazole  (PROTONIX ) 40 MG tablet Take 1 tablet (40 mg total) by mouth daily. 90 tablet 3   sertraline  (ZOLOFT ) 50 MG tablet Take 1 tablet (50 mg total) by mouth daily. 90 tablet 3   predniSONE  (DELTASONE ) 20 MG tablet Take 1 tablet (20 mg total) by mouth daily with breakfast. 5 tablet 0   saw palmetto  160 MG capsule Take 320 mg by mouth daily with lunch. (Patient not taking: Reported on 09/30/2023)     No facility-administered medications prior to visit.    Allergies  Allergen Reactions   Crestor  [Rosuvastatin ] Other (See Comments)    disorientation   Niacin Rash    My whole body turned red on the prescription dose He takes the OTC medication   Pravastatin Other (See Comments)    Disorientation    Review of Systems  Neurological:  Positive for dizziness.   See HPI    Objective:    Physical Exam Constitutional:      General: He is not in acute  distress.    Appearance: He is well-developed.  HENT:     Head: Normocephalic and atraumatic.  Cardiovascular:     Rate and Rhythm: Normal rate and regular rhythm.     Heart sounds: Murmur heard.  Pulmonary:     Effort: Pulmonary effort is normal. No respiratory distress.     Breath sounds: Normal breath sounds. No wheezing or rales.  Skin:    General: Skin is warm and dry.  Neurological:     Mental Status: He is alert and oriented to person, place, and time.     Deep Tendon Reflexes:     Reflex Scores:      Patellar reflexes are 2+ on the right side and 2+ on the left side.    Comments: Wide based gait Poor balance noted, especially with turning around Mildly positive [place] bilaterally  Intermittent RUE involuntary movements- like a jerk, not necessarily a tremor  Psychiatric:        Behavior: Behavior normal.        Thought Content: Thought content normal.      BP 134/67   Pulse 65   Temp 97.8 F (36.6 C) (Oral)   Resp 16   Ht 5' 7 (1.702 m)   Wt 176 lb (79.8 kg)   SpO2 99%   BMI 27.57 kg/m  Wt Readings from Last 3 Encounters:  10/14/23 176 lb (79.8 kg)  09/30/23 178 lb 3.2 oz (80.8 kg)  09/26/23 178 lb (80.7 kg)       Assessment & Plan:   Problem List Items Addressed This Visit       Unprioritized   Paresthesia   Could be radicular symptoms from his low back pain, but I would also like to obtain a B12 and Folate level.  Relevant Orders   B12 and Folate Panel   Gait disorder   New, he is working with PT.  Continue PT.  I will also refer him back to neurology for further evaluation of his gait and RUE movement issues- I have concern for Parkinson's Disease.       Relevant Orders   Ambulatory referral to Neurology   Dizziness - Primary   I would like to obtain a stat CT head to rule out CVA as cause (especially given hx of Factor V Deficiency). Other possible causes include vertigo.  Consider referral to PT vestibular rehab if head CT  negative for CVA.  I doubt his symptoms are gabapentin  related as discontinuation did not improve his symptoms.       Relevant Orders   CT HEAD WO CONTRAST ( )   Urine Culture   Backache   Stable with prn tylenol .       Aortic valve disorder   Murmur noted today.  Pt noted to have moderate AS on echo 7/23.  Appears clinically compensated.       Other Visit Diagnoses       Weakness       Relevant Orders   Comp Met (CMET)   CBC w/Diff       I have discontinued Shawn Salinas Den Langenberg's saw palmetto  and predniSONE . I am also having him maintain his Cholecalciferol, Eliquis , multivitamin with minerals, ascorbic acid, cyanocobalamin , pantoprazole , ezetimibe , sertraline , donepezil , memantine , and gabapentin .  No orders of the defined types were placed in this encounter.

## 2023-10-14 NOTE — Therapy (Signed)
 OUTPATIENT PHYSICAL THERAPY THORACOLUMBAR TREATMENT   Patient Name: Jarret Torre MRN: [id number] DOB:26-Aug-1934, 89 y.o., male Today's Date: 10/14/2023  END OF SESSION:  PT End of Session - 10/14/23 1142     Visit Number 13    Date for PT Re-Evaluation 10/17/23    Authorization Type [employer]etna medicare    PT Start Time 1143    Activity Tolerance Patient tolerated treatment well;No increased pain    Behavior During Therapy [facility] for tasks assessed/performed           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   Generalized anxiety disorder 10/30/2021   Hemorrhoids    Hepatic cyst 11/19/2007   High serum vitamin D  01/12/2020   History of blood clots    History of colon polyps 11/02/2011   hyperplastic   Hyperlipidemia    Impacted cerumen 07/29/2009   Major depressive disorder 10/30/2021   Osteoarthritis    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   Otitis externa 07/07/2021   Overweight 10/05/2010   Pain of left thumb 02/20/2016   Pulmonary embolism (HCC) 11/20/2009   Right groin pain 11/16/2021   Thrombocytopenia 12/01/2009   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   Past Surgical History:  Procedure Laterality Date   APPENDECTOMY     CATARACT EXTRACTION  02/09/2007   right eye   INGUINAL HERNIA REPAIR     INGUINAL HERNIA REPAIR Right 01/05/2022   Procedure: RIGHT HERNIA REPAIR RECURRENT INGUINAL WITH THREE BY SIX POLYPROPYLENE MESH;  Surgeon: Sebastian Moles, MD;  Location: [facility] OR;   Service: General;  Laterality: Right;  GEN AND TAP BLOCK   TONSILLECTOMY     VARICOSE VEIN SURGERY     VASECTOMY     Patient Active Problem List   Diagnosis Date Noted   Family history of early CAD 04/26/2023   Essential hypertension 04/26/2023   Major depressive disorder 10/30/2021   Generalized anxiety disorder 10/30/2021   Ataxia of right upper extremity 08/09/2021   Otitis externa 07/07/2021   Dementia due to Alzheimer's disease 03/18/2020   High serum vitamin D  01/12/2020   Pain of left thumb 02/20/2016   History of colon polyps 11/02/2011   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   Overweight 10/05/2010   Backache 04/21/2010   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  Recent months per patient  NEXT MD VISIT:    SUBJECTIVE:  SUBJECTIVE STATEMENT:  Patient reports feels lightheaded today.  Spouse reports he took some gabapentin  last week and it made him very weak and unbalanced.  She states they stopped it on Saturday.  However, he will f/u with PCP later this PM for new symptoms.  PAIN: Are you having pain? Yes: NPRS scale: wong-baker faces;  0/10 now; 7/10 worst over the last  week Pain location: low back and R hip/posterior thigh Pain description: aching Aggravating factors: can't give any Relieving factors: meds  PERTINENT HISTORY:  dementia, blood clotting disorder (Factor V), h/o DVT and PE, ataxia, LBP, moderate aortic sclerosis  PRECAUTIONS: Fall  RED FLAGS: None  WEIGHT BEARING RESTRICTIONS: No  FALLS:  Has patient fallen in last 6 months? 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  PLOF: Independent with gait  PATIENT GOALS: feel better with my back   OBJECTIVE: (objective measures completed at initial evaluation unless otherwise dated)  DIAGNOSTIC FINDINGS:    PATIENT SURVEYS:  Patient can't reliably answer any surverys  SCREENING FOR RED FLAGS: Bowel or bladder incontinence: No Spinal tumors: No Cauda equina syndrome: No Compression fracture: No Abdominal aneurysm: No  COGNITION:  Overall cognitive status: Impaired    SENSATION: WFL  POSTURE:  rounded shoulders, forward head, and decreased lumbar lordosis  PALPATION: Unable to palpate any areas of pain today with deep pressure to his low back, pelvis, hips, and with vertebral P/A glides from T7 to L5  LUMBAR ROM:   Active  Eval  Flexion To knees; p!  Extension 30%  Right lateral flexion To mid thigh  Left lateral flexion To mid thigh  Right rotation 50%  Left rotation 50%  (Blank rows = not tested)  MUSCLE LENGTH: Hamstrings: Right SLR 40 deg; Left SLR 50 deg Thomas test: Right 10 deg; Left NT  Hamstrings: 90/90 is 45 deg RLE;  50 deg LLE ITB: NT Piriformis: moderately stiff R, mildly stiff LLE Hip flexors: minimal tightness Quads: NT Heelcord:  NT  LOWER EXTREMITY ROM:     Passive  Right eval Left eval  Hip flexion    Hip extension    Hip abduction    Hip adduction    Hip internal rotation 18 24  Hip external rotation 33 39  Knee flexion    Knee extension    Ankle dorsiflexion    Ankle plantarflexion    Ankle inversion    Ankle eversion    (Blank rows = not tested)  LOWER EXTREMITY MMT:    MMT Right eval Left eval  Hip flexion 4 4  Hip extension 3+ 3+  Hip abduction 4- 4-  Hip adduction    Hip internal rotation 4- 4  Hip external rotation 4 4+  Knee flexion 4 4  Knee extension 5 5  Ankle dorsiflexion 4 4  Ankle plantarflexion    Ankle inversion    Ankle eversion     (Blank rows = not tested)  LUMBAR SPECIAL TESTS:  Straight leg raise test: Negative, Slump test: Negative, SI Compression/distraction test: Negative, and FABER test: Negative  FUNCTIONAL TESTS:  See initial eval and prior notes for functional test results  GAIT: Distance walked: into clinic from parking lot Assistive device  utilized: None Level of assistance: CGA Gait pattern: decreased arm swing- Right, decreased arm swing- Left, decreased step length- Right, decreased step length- Left, decreased stance time- Right, decreased stance time- Left, decreased stride length, decreased ankle dorsiflexion- Right, and decreased ankle dorsiflexion- Left Comments: He ambulates with no device with unsteadiness, short shuffling gait pattern with no trunk or arm movement and very flat back, rounded shoulders   TODAY'S TREATMENT:  10/14/23 THERAPEUTIC ACTIVITIES: To improve functional performance.  Demonstration, verbal and tactile cues throughout for technique. Supine manual PT assist hamstring stretch x 1' x 3 BLE Hooklying fingers to knees crunch x 3/5 Ball S/S X 10 BLE Peanut ball rollouts x 2/10 BLE Lumbar arch and slump x 10 Bridging x 3/10 BLE Supine LTR x 20 BLE Standing back extension x 15 Seated ham stretch x 1' x 3 BLE Seated hip flexor  stretch x 1' x 3 BLE    10/10/23 Bike L4x20min Seated hamstring stretch x 1 min B Seated piriformis stretch fig 4- 2 x 1 min B Standing lumbar extension back to counter 10x3 Leg curls 20lb 2x10 BLE Leg ext 10lb 2x10 BLE Supine LTR x 10 each way Bridging x 20 Clamshells supine GTB with TrA x 20  10/03/23 SELF CARE: Provided education on PT POC progression, to improve safety with use of SPC for assistive device, to reduce fall risk, and to promote safe home environment.  PATIENT EDUCATION:  Education details: PT eval findings, anticipated POC, and initial HEP  Person educated: Patient Education method: Explanation, Demonstration, Verbal cues, Tactile cues, and Handouts Education comprehension: verbalized understanding, verbal cues required, tactile cues required, and needs further education  HOME EXERCISE PROGRAM: Access Code: [email or web address] URL: [email or web address] Date: 10/03/2023 Prepared by: Garnette Montclair  Exercises - Seated Hamstring Stretch  - 1 x daily - 7 x weekly - 1 sets - 1 reps - 1 min hold - Standing Lumbar Extension with Counter  - 1 x daily - 7 x weekly - 1 sets - 10 reps - Supine Lower Trunk Rotation  - 1 x daily - 7 x weekly - 1 sets - 10 reps - Seated Figure 4 Piriformis Stretch  - 1 x daily - 7 x weekly - 1 sets - 1 reps - 1 min hold - Seated Piriformis Stretch  - 1 x daily - 7 x weekly - 1 sets - 1 reps - 1 min hold - Supine Bridge  - 1 x daily - 7 x weekly - 3 sets - 10 reps   ASSESSMENT:  CLINICAL IMPRESSION: Patient reports feeling lightheaded today.  Attributes it to taking some new medications.  However, states he stopped the meds 3 days ago.   His vitals are checked (see vitals section of the chart for today) and are WNL.  No orthostasis.   He is going to f/u with his PCP at [place] across the hall this afternoon.  Treatment today is mostly done in supine due to these symptoms.  He is very unsteady with his gait today.  PT remains necessary  for LBP, significant stiffness, weakness and unsteadiness.   Continue per POC  OBJECTIVE IMPAIRMENTS: Abnormal gait, decreased balance, difficulty walking, decreased ROM, decreased strength, decreased safety awareness, impaired perceived functional ability, impaired flexibility, improper body mechanics, postural dysfunction, and pain.   ACTIVITY LIMITATIONS: carrying, lifting, bending, stairs, and locomotion level  PARTICIPATION LIMITATIONS: community activity  PERSONAL FACTORS: Age and 1-2 comorbidities:   dementia, blood clotting disorder (Factor V), h/o DVT and PE, ataxia,  LBP, moderate aortic sclerosis are also affecting patient's functional outcome.   REHAB POTENTIAL: Good  CLINICAL DECISION MAKING: Evolving/moderate complexity  EVALUATION COMPLEXITY: Moderate   GOALS: Goals reviewed with patient? Yes  SHORT TERM GOALS: Target date: 10/24/2023   Patient will be independent with initial HEP to improve outcomes and carryover.  Baseline: 100% PT assist required for correct completion 9/8:  Needs assist  Goal status: IN PROGRESS  2.  Patient will report 25% improvement in low back pain to improve QOL. Baseline: variable pain day to day with 7/10 worst 10/14/23:  tells me worst pain over weekend was 5/10 Goal status: IN PROGRESS  LONG TERM GOALS: Target date: 11/14/2023   Patient will be independent with ongoing/advanced HEP for self-management at home.  Baseline: no advanced lumbar HEP yet 10/14/23:  progressing with more advanced exercises Goal status: INITIAL  2.  Patient will report 50-75% improvement in low back pain to improve QOL.  Baseline: 7/10 worst Goal status: INITIAL  3.  Patient to demonstrate ability to achieve and maintain good spinal alignment/posturing and body mechanics needed for daily activities. Baseline: flat back, flexed hips in standing with forward head Goal status: INITIAL  4.  Patient will demonstrate increased lumbar ROM  by 10-20% in all planes  to perform ADLs.   Baseline: Refer to above lumbar ROM table Goal status: INITIAL  5.  Patient will demonstrate improved BLE strength to >/= 4+/5 for improved stability and ease of mobility. Baseline: Refer to above LE MMT table Goal status: INITIAL  7.  Patient will tolerate 20-30 min of (standing/sitting/walking) w/o increased pain to allow for  improved mobility and activity tolerance and be able to go shopping with his spouse Baseline: able to access community with spouse supervision/assistance but in pain Goal status: INITIAL PLAN:  PT FREQUENCY: 1-2x/week  PT DURATION: 6 weeks  PLANNED INTERVENTIONS: 97164- PT Re-evaluation, 97750- Physical Performance Testing, 97110-Therapeutic exercises, 97530- Therapeutic activity, [id number]- Neuromuscular re-education, 97535- Self Care, [id number]- Manual therapy, [id number]- Gait training, H9716- Electrical stimulation (unattended), 97016- Vasopneumatic device, [id number]- Ultrasound, [id number]- Traction (mechanical), [id number]- Ionotophoresis 4mg /ml Dexamethasone , Taping, Joint mobilization, Spinal mobilization, Cryotherapy, and Moist heat  PLAN FOR NEXT SESSION:  Continue to progress lumbar stretching and strengthening as able.  Reassess lightheadedness.   Engelbert Sevin, PT 10/14/2023, 1:05 PM

## 2023-10-14 NOTE — Telephone Encounter (Signed)
 FYI Only or Action Required?: FYI only for provider.  Patient was last seen in primary care on 09/30/2023 by Cyndi Shaver, PA-C.  Called Nurse Triage reporting Dizziness.  Symptoms began a week ago.  Interventions attempted: Rest, hydration, or home remedies.  Symptoms are: unchanged.  Triage Disposition: See Physician Within 24 Hours  Patient/caregiver understands and will follow disposition?: Yes  Copied from CRM [id number]. Topic: Clinical - Red Word Triage >> Oct 14, 2023 10:18 AM Kevelyn M wrote: Red Word that prompted transfer to Nurse Triage: Light headed and legs are tingly and feels like he's falling forward for a while but it's getting worse. One day he almost fainted. Reason for Disposition  [1] MODERATE dizziness (e.g., interferes with normal activities) AND [2] has NOT been evaluated by doctor (or NP/PA) for this  (Exception: Dizziness caused by heat exposure, sudden standing, or poor fluid intake.)  Answer Assessment - Initial Assessment Questions 1. DESCRIPTION: Describe your dizziness.     Feels like falling forward 2. LIGHTHEADED: Do you feel lightheaded? (e.g., somewhat faint, woozy, weak upon standing)     yes 3. VERTIGO: Do you feel like either you or the room is spinning or tilting? (i.e., vertigo)     no 4. SEVERITY: How bad is it?  Do you feel like you are going to faint? Can you stand and walk?     moderate 5. ONSET:  When did the dizziness begin?     Started a week ago 6. AGGRAVATING FACTORS: Does anything make it worse? (e.g., standing, change in head position)     no 7. HEART RATE: Can you tell me your heart rate? How many beats in 15 seconds?  (Note: Not all patients can do this.)       NA 8. CAUSE: What do you think is causing the dizziness? (e.g., decreased fluids or food, diarrhea, emotional distress, heat exposure, new medicine, sudden standing, vomiting; unknown)     Possible medication-was started on Gabapentin  a few weeks  ago 9. RECURRENT SYMPTOM: Have you had dizziness before? If Yes, ask: When was the last time? What happened that time?     no 10. OTHER SYMPTOMS: Do you have any other symptoms? (e.g., fever, chest pain, vomiting, diarrhea, bleeding)       no  Protocols used: Dizziness - Lightheadedness-A-AH

## 2023-10-14 NOTE — Assessment & Plan Note (Signed)
 Murmur noted today.  Pt noted to have moderate AS on echo 7/23.  Appears clinically compensated.

## 2023-10-14 NOTE — Assessment & Plan Note (Signed)
 New, he is working with PT.  Continue PT.  I will also refer him back to neurology for further evaluation of his gait and RUE movement issues- I have concern for Parkinson's Disease.

## 2023-10-14 NOTE — Assessment & Plan Note (Signed)
 Stable with prn tylenol .

## 2023-10-14 NOTE — Assessment & Plan Note (Addendum)
 I would like to obtain a stat CT head to rule out CVA as cause (especially given hx of Factor V Deficiency). Other possible causes include vertigo.  Consider referral to PT vestibular rehab if head CT negative for CVA.  I doubt his symptoms are gabapentin  related as discontinuation did not improve his symptoms.

## 2023-10-14 NOTE — Patient Instructions (Addendum)
 VISIT SUMMARY:  During your visit, we discussed your persistent lightheadedness, balance issues, and other symptoms. We have planned several tests and follow-ups to better understand and manage your condition.  YOUR PLAN:  DIZZINESS AND UNSTEADINESS: You have been experiencing persistent dizziness and unsteadiness, which may be related to vertigo or other conditions. -We will order a head CT to rule out a stroke. -Continue with physical therapy.  If head CT is negative for stroke, I will try to get PT to do some exercises for vertigo with you. -We will obtain basic lab work, including a urinalysis, to rule out a urinary tract infection (UTI).  LUMBAR RADICULOPATHY WITH LOWER EXTREMITY PARESTHESIAS AND LOW BACK PAIN: You have chronic low back pain and tingling sensations in your legs, likely due to lumbar radiculopathy. -Continue taking Tylenol  for pain management. -We will check your B12 and folic acid  levels to rule out deficiencies that might be causing the tingling sensations.  RIGHT ARM MOVEMENTS You have an intermittent involuntary movement of your right arm.  -We will refer you back to Dr. Georjean for this.   FATIGUE: You are experiencing chronic fatigue, which may be related to your other conditions. -We will evaluate for potential underlying causes, including B12 deficiency and a UTI.

## 2023-10-14 NOTE — Assessment & Plan Note (Signed)
 Could be radicular symptoms from his low back pain, but I would also like to obtain a B12 and Folate level.

## 2023-10-15 ENCOUNTER — Telehealth: Payer: Self-pay | Admitting: Family Medicine

## 2023-10-15 LAB — COMPREHENSIVE METABOLIC PANEL WITH GFR
ALT: 27 U/L (ref 0–53)
AST: 23 U/L (ref 0–37)
Albumin: 4.3 g/dL (ref 3.5–5.2)
Alkaline Phosphatase: 56 U/L (ref 39–117)
BUN: 16 mg/dL (ref 6–23)
CO2: 34 meq/L — ABNORMAL HIGH (ref 19–32)
Calcium: 9.7 mg/dL (ref 8.4–10.5)
Chloride: 101 meq/L (ref 96–112)
Creatinine, Ser: 1.05 mg/dL (ref 0.40–1.50)
GFR: 64.17 mL/min (ref >60.00)
Glucose, Bld: 125 mg/dL — ABNORMAL HIGH (ref 70–99)
Potassium: 4.3 meq/L (ref 3.5–5.1)
Sodium: 141 meq/L (ref 135–145)
Total Bilirubin: 1 mg/dL (ref 0.2–1.2)
Total Protein: 6.9 g/dL (ref 6.0–8.3)

## 2023-10-15 LAB — CBC WITH DIFFERENTIAL/PLATELET
Basophils Absolute: 0 K/uL (ref 0.0–0.1)
Basophils Relative: 0.6 % (ref 0.0–3.0)
Eosinophils Absolute: 0.2 K/uL (ref 0.0–0.7)
Eosinophils Relative: 3.1 % (ref 0.0–5.0)
HCT: 47.6 % (ref 39.0–52.0)
Hemoglobin: 16.6 g/dL (ref 13.0–17.0)
Lymphocytes Relative: 24 % (ref 12.0–46.0)
Lymphs Abs: 1.2 K/uL (ref 0.7–4.0)
MCHC: 34.9 g/dL (ref 30.0–36.0)
MCV: 99.8 fl (ref 78.0–100.0)
Monocytes Absolute: 0.4 K/uL (ref 0.1–1.0)
Monocytes Relative: 8.6 % (ref 3.0–12.0)
Neutro Abs: 3.1 K/uL (ref 1.4–7.7)
Neutrophils Relative %: 63.7 % (ref 43.0–77.0)
Platelets: 126 K/uL — ABNORMAL LOW (ref 150.0–400.0)
RBC: 4.78 Mil/uL (ref 4.22–5.81)
RDW: 12.7 % (ref 11.5–15.5)
WBC: 4.9 K/uL (ref 4.0–10.5)

## 2023-10-15 LAB — URINE CULTURE
MICRO NUMBER:: 16936568
Result:: NO GROWTH
SPECIMEN QUALITY:: ADEQUATE

## 2023-10-15 LAB — B12 AND FOLATE PANEL
Folate: >22.9 ng/mL (ref >5.9)
Vitamin B-12: 1048 pg/mL — ABNORMAL HIGH (ref 211–911)

## 2023-10-15 NOTE — Telephone Encounter (Signed)
 Copied from CRM [id number]. Topic: Clinical - Medication Prior Auth >> Oct 15, 2023  8:08 AM Kathrin PARAS wrote: Reason for CRM: Patients wife said she received a call from insurance company with approval code for head scan [id number]. She wants to know what is the patient supposed to do next?

## 2023-10-16 ENCOUNTER — Ambulatory Visit: Admitting: Rehabilitation

## 2023-10-16 ENCOUNTER — Ambulatory Visit (HOSPITAL_BASED_OUTPATIENT_CLINIC_OR_DEPARTMENT_OTHER)
Admission: RE | Admit: 2023-10-16 | Discharge: 2023-10-16 | Disposition: A | Discharge status: Home / Self Care | Source: Ambulatory Visit | Attending: Family | Admitting: Family

## 2023-10-16 ENCOUNTER — Ambulatory Visit: Payer: Self-pay | Admitting: Family

## 2023-10-16 DIAGNOSIS — G9389 Other specified disorders of brain: Secondary | ICD-10-CM | POA: Diagnosis not present

## 2023-10-16 DIAGNOSIS — H8113 Benign paroxysmal vertigo, bilateral: Secondary | ICD-10-CM

## 2023-10-16 DIAGNOSIS — R269 Unspecified abnormalities of gait and mobility: Secondary | ICD-10-CM

## 2023-10-16 DIAGNOSIS — M5459 Other low back pain: Secondary | ICD-10-CM

## 2023-10-16 DIAGNOSIS — R42 Dizziness and giddiness: Secondary | ICD-10-CM | POA: Insufficient documentation

## 2023-10-16 DIAGNOSIS — M6281 Muscle weakness (generalized): Secondary | ICD-10-CM

## 2023-10-16 DIAGNOSIS — R296 Repeated falls: Secondary | ICD-10-CM | POA: Diagnosis not present

## 2023-10-16 DIAGNOSIS — R2681 Unsteadiness on feet: Secondary | ICD-10-CM | POA: Diagnosis not present

## 2023-10-16 DIAGNOSIS — I6523 Occlusion and stenosis of bilateral carotid arteries: Secondary | ICD-10-CM | POA: Diagnosis not present

## 2023-10-16 NOTE — Telephone Encounter (Signed)
 Can you please see if the lab can add on an A1C, dx hyperglycemia? tks

## 2023-10-16 NOTE — Therapy (Signed)
 OUTPATIENT PHYSICAL THERAPY THORACOLUMBAR TREATMENT /RECERTIFICATION   Patient Name: Shawn [name] MRN: [id number] DOB:Aug 08, 1934, 89 y.o., male Today's Date: 10/16/2023  END OF SESSION:  PT End of Session - 10/16/23 1301     Visit Number 14    Date for PT Re-Evaluation 10/17/23    Authorization Type [employer]etna medicare    PT Start Time 1153    PT Stop Time 1240    PT Time Calculation (min) 47 min    Activity Tolerance Patient tolerated treatment well;No increased pain    Behavior During Therapy [facility] for tasks assessed/performed            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   Generalized anxiety disorder 10/30/2021   Hemorrhoids    Hepatic cyst 11/19/2007   High serum vitamin D  01/12/2020   History of blood clots    History of colon polyps 11/02/2011   hyperplastic   Hyperlipidemia    Impacted cerumen 07/29/2009   Major depressive disorder 10/30/2021   Osteoarthritis    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   Otitis externa 07/07/2021   Overweight 10/05/2010   Pain of left thumb 02/20/2016   Pulmonary embolism (HCC) 11/20/2009   Right groin pain 11/16/2021   Thrombocytopenia 12/01/2009   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   Past Surgical History:  Procedure Laterality Date   APPENDECTOMY     CATARACT EXTRACTION  02/09/2007   right eye   INGUINAL HERNIA REPAIR     INGUINAL HERNIA REPAIR Right 01/05/2022   Procedure: RIGHT HERNIA REPAIR RECURRENT INGUINAL WITH  THREE BY SIX POLYPROPYLENE MESH;  Surgeon: Sebastian Moles, MD;  Location: [facility] OR;  Service: General;  Laterality: Right;  GEN AND TAP BLOCK   TONSILLECTOMY     VARICOSE VEIN SURGERY     VASECTOMY     Patient Active Problem List   Diagnosis Date Noted   Gait disorder 10/14/2023   Dizziness 10/14/2023   Paresthesia 10/14/2023   Family history of early CAD 04/26/2023   Essential hypertension 04/26/2023   Major depressive disorder 10/30/2021   Generalized anxiety disorder 10/30/2021   Ataxia of right upper extremity 08/09/2021   Dementia due to Alzheimer's disease 03/18/2020   High serum vitamin D  01/12/2020   Pain of left thumb 02/20/2016   History of colon polyps 11/02/2011   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   Overweight 10/05/2010   Backache 04/21/2010   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   Thrombocytopenia 12/01/2009   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   Aortic valve disorder 11/19/2007   Benign neoplasm of skin 08/11/2007   Hyperlipidemia 09/04/2006   Osteoarthritis 09/04/2006   Dysphagia 09/04/2006    PCP: Antonio Cyndee Jamee JONELLE, DO   REFERRING PROVIDER: Dina Camie BRAVO, PA-C   REFERRING DIAG: G89.29,M54.41 (ICD-10-CM) - Chronic bilateral low back pain with right-sided sciatica   THERAPY DIAG:  Other low back pain  Muscle weakness (generalized)  Gait abnormality  Unsteadiness on  feet  Repeated falls  RATIONALE FOR EVALUATION AND TREATMENT: Rehabilitation  ONSET DATE: ?  Recent months per patient  NEXT MD VISIT:    SUBJECTIVE:                                                                                                                                                                                                         SUBJECTIVE STATEMENT:  Patient and spouse report he is still having worsening balance.  They deny falls but he is much worst over the last week for no apparent reason.   They are going for head  imaging downstairs when they leave therapy today.  The patient is also still having the LBP with RLE radicular symptoms.  He and spouse report that he awakens in significant pain and does better as they day progresses with less radiculopathy.    PAIN: Are you having pain? Yes: NPRS scale: wong-baker faces;  0/10 now; 7/10 worst over the last week Pain location: low back and R hip/posterior thigh Pain description: aching Aggravating factors: can't give any Relieving factors: meds  PERTINENT HISTORY:  dementia, blood clotting disorder (Factor V), h/o DVT and PE, ataxia, LBP, moderate aortic sclerosis  PRECAUTIONS: Fall  RED FLAGS: None  WEIGHT BEARING RESTRICTIONS: No  FALLS:  Has patient fallen in last 6 months? 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  PLOF: Independent with gait  PATIENT GOALS: feel better with my back   OBJECTIVE: (objective measures completed at initial evaluation unless otherwise dated)  DIAGNOSTIC FINDINGS:    PATIENT SURVEYS:  Patient can't reliably answer any surverys  SCREENING FOR RED FLAGS: Bowel or bladder incontinence: No Spinal tumors: No Cauda equina syndrome: No Compression fracture: No Abdominal aneurysm: No  COGNITION:  Overall cognitive status: Impaired    SENSATION: WFL  POSTURE:  rounded shoulders, forward head, and decreased lumbar lordosis  PALPATION: Unable to palpate any areas of pain today with deep pressure to his low back, pelvis, hips, and with vertebral P/A glides from T7 to L5  LUMBAR ROM:   Active  Eval  Flexion To knees; p!  Extension 30%  Right lateral flexion To mid thigh  Left lateral  flexion To mid thigh  Right rotation 50%  Left rotation 50%  (  Blank rows = not tested)  MUSCLE LENGTH: Hamstrings: Right SLR 40 deg; Left SLR 50 deg Thomas test: Right 10 deg; Left NT  Hamstrings: 90/90 is 45 deg RLE;  50 deg LLE ITB: NT Piriformis: moderately stiff R, mildly stiff LLE Hip flexors: minimal tightness Quads: NT Heelcord: NT  LOWER EXTREMITY ROM:     Passive  Right eval Left eval  Hip flexion    Hip extension    Hip abduction    Hip adduction    Hip internal rotation 18 24  Hip external rotation 33 39  Knee flexion    Knee extension    Ankle dorsiflexion    Ankle plantarflexion    Ankle inversion    Ankle eversion    (Blank rows = not tested)  LOWER EXTREMITY MMT:    MMT Right eval Left eval RLE 10/16/23 LLE 10/16/23  Hip flexion 4 4 4+ 4  Hip extension 3+ 3+ 4- 4-  Hip abduction 4- 4- 4- 4-  Hip adduction      Hip internal rotation 4- 4 4 4   Hip external rotation 4 4+ 4 4+  Knee flexion 4 4 4+ 4+  Knee extension 5 5 5 5   Ankle dorsiflexion 4 4 4 4   Ankle plantarflexion      Ankle inversion      Ankle eversion       (Blank rows = not tested)  LUMBAR SPECIAL TESTS:  Straight leg raise test: Negative, Slump test: Negative, SI Compression/distraction test: Negative, and FABER test: Negative  FUNCTIONAL TESTS:  10/16/23:  BERG = 40/56  Gait speed = 1.2 ft/sec  5X STS = 17.51 sec  TUG = 14.38 sec  GAIT: Distance walked: into clinic from parking lot Assistive device utilized: None Level of assistance: CGA Gait pattern: decreased arm swing- Right, decreased arm swing- Left, decreased step length- Right, decreased step length- Left, decreased stance time- Right, decreased stance time- Left, decreased stride length, decreased ankle dorsiflexion- Right, and decreased ankle dorsiflexion- Left Comments: He ambulates with no device with unsteadiness, short shuffling gait pattern with no trunk or arm movement and very flat back, rounded  shoulders   TODAY'S TREATMENT:  10/16/23 THERAPEUTIC EXERCISE: To improve ROM.  Demonstration, verbal and tactile cues throughout for technique. NuStep L4 x 7'  SKTC flexion stretch x 10 DKTC flexion stretchx 10 Supine manual hamstretch x 1' x 3 RLE Supine finger slides to knees curl up x 2/10 Bridge with knee extension x 10 BLE Seated FABER piriformis stretch x 1' x 3 RLE Seated cross over piriformis stretch x 1' x # RLE  PHYSICAL PERFORMANCE TEST or MEASUREMENT:   [facility] PT Assessment - 10/16/23 0001       Berg Balance Test   Sit to Stand Able to stand  independently using hands    Standing Unsupported Able to stand safely 2 minutes    Sitting with Back Unsupported but Feet Supported on Floor or Stool Able to sit safely and securely 2 minutes    Stand to Sit Sits safely with minimal use of hands    Transfers Able to transfer safely, minor use of hands    Standing Unsupported with Eyes Closed Able to stand 10 seconds with supervision    Standing Unsupported with Feet Together Able to place feet together independently and stand for 1 minute with supervision    From Standing, Reach Forward with Outstretched Arm Can reach confidently >25 cm (10)    From Standing Position, Pick up  Object from Floor Able to pick up shoe, needs supervision    From Standing Position, Turn to Look Behind Over each Shoulder Looks behind one side only/other side shows less weight shift    Turn 360 Degrees Able to turn 360 degrees safely one side only in 4 seconds or less    Standing Unsupported, Alternately Place Feet on Step/Stool Able to complete >2 steps/needs minimal assist    Standing Unsupported, One Foot in [employer] balance while stepping or standing    Standing on One Leg Tries to lift leg/unable to hold 3 seconds but remains standing independently    Total Score 40           10/14/23 THERAPEUTIC ACTIVITIES: To improve functional performance.  Demonstration, verbal and tactile cues  throughout for technique. Supine manual PT assist hamstring stretch x 1' x 3 BLE Hooklying fingers to knees crunch x 3/5 Ball S/S X 10 BLE Peanut ball rollouts x 2/10 BLE Lumbar arch and slump x 10 Bridging x 3/10 BLE Supine LTR x 20 BLE Standing back extension x 15 Seated ham stretch x 1' x 3 BLE Seated hip flexor stretch x 1' x 3 BLE   10/10/23 Bike L4x39min Seated hamstring stretch x 1 min B Seated piriformis stretch fig 4- 2 x 1 min B Standing lumbar extension back to counter 10x3 Leg curls 20lb 2x10 BLE Leg ext 10lb 2x10 BLE Supine LTR x 10 each way Bridging x 20 Clamshells supine GTB with TrA x 20  10/03/23 SELF CARE: Provided education on PT POC progression, to improve safety with use of SPC for assistive device, to reduce fall risk, and to promote safe home environment.  PATIENT EDUCATION:  Education details: PT eval findings, anticipated POC, and initial HEP  Person educated: Patient Education method: Explanation, Demonstration, Verbal cues, Tactile cues, and Handouts Education comprehension: verbalized understanding, verbal cues required, tactile cues required, and needs further education  HOME EXERCISE PROGRAM: Access Code: [email or web address] URL: [email or web address] Date: 10/03/2023 Prepared by: Garnette Montclair  Exercises - Seated Hamstring Stretch  - 1 x daily - 7 x weekly - 1 sets - 1 reps - 1 min hold - Standing Lumbar Extension with Counter  - 1 x daily - 7 x weekly - 1 sets - 10 reps - Supine Lower Trunk Rotation  - 1 x daily - 7 x weekly - 1 sets - 10 reps - Seated Figure 4 Piriformis Stretch  - 1 x daily - 7 x weekly - 1 sets - 1 reps - 1 min hold - Seated Piriformis Stretch  - 1 x daily - 7 x weekly - 1 sets - 1 reps - 1 min hold - Supine Bridge  - 1 x daily - 7 x weekly - 3 sets - 10 reps   ASSESSMENT:  CLINICAL IMPRESSION: Patient has been seen x 2 months PT initially for unsteady gait and falls and more recently for LBP with R sided  sciatica.  Initially his balance made very good improvement so we were able to focus more on his LBP.   He continues to c/o increased LBP in the AM hours that improves as the day goes on.   Spouse reports he gets up in the morning in a lot of back and RLE pain, though.   He has severely tight hamstrings and I doubt he is remembering to stretch them at home due to his dementia which is a barrier to progress.   We are continuing to progress his lumbar  exercises as able.   Spouse also reports that over the last week the patient has been significantly more unsteady for no apparent reason.  His 5X sit to stand score has improved from 18.29 to 17.51 sec, and TUG score has improved from 15.25 to 14.38 sec.   His Berg Score today is 40/56 indicating that he still needs to be using a walker.  He is very unsteady in the clinic today in comparison to the last 3 weeks.    He is going to have a brain MRI after leaving therapy today so we will know more once the results are in.   We will continue PT to address his LBP, but also his unsteady gait, dizziness, balance deficits.  He is agreeable to continue PT per POC.   OBJECTIVE IMPAIRMENTS: Abnormal gait, decreased balance, difficulty walking, decreased ROM, decreased strength, decreased safety awareness, impaired perceived functional ability, impaired flexibility, improper body mechanics, postural dysfunction, and pain.   ACTIVITY LIMITATIONS: carrying, lifting, bending, stairs, and locomotion level  PARTICIPATION LIMITATIONS: community activity  PERSONAL FACTORS: Age and 1-2 comorbidities:   dementia, blood clotting disorder (Factor V), h/o DVT and PE, ataxia, LBP, moderate aortic sclerosis are also affecting patient's functional outcome.   REHAB POTENTIAL: Good  CLINICAL DECISION MAKING: Evolving/moderate complexity  EVALUATION COMPLEXITY: Moderate   GOALS: Goals reviewed with patient? Yes  SHORT TERM GOALS: Target date: 11/16/2023   Patient will be  independent with initial HEP to improve outcomes and carryover.  Baseline: 100% PT assist required for correct completion 9/8:  Needs assist  Goal status: IN PROGRESS  2.  Patient will report 25% improvement in low back pain to improve QOL. Baseline: variable pain day to day with 7/10 worst 10/14/23:  tells me worst pain over weekend was 5/10 Goal status: IN PROGRESS  LONG TERM GOALS: Target date: 12/14/2023   Patient will be independent with ongoing/advanced HEP for self-management at home.  Baseline: no advanced lumbar HEP yet 10/14/23:  progressing with more advanced exercises Goal status: INITIAL  2.  Patient will rep/ort 50-75% improvement in low back pain to improve QOL. / Baseline: 7/10 worst:   Goal status: INITIAL  3.  Patient to demonstrate ability to achieve and maintain good spinal alignment/posturing and body mechanics needed for daily activities. Baseline: flat back, flexed hips in standing with forward head Goal status: INITIAL  4.  Patient will demonstrate increased lumbar ROM  by 10-20% in all planes to perform ADLs.   Baseline: Refer to above lumbar ROM table Goal status: INITIAL  5.  Patient will demonstrate improved BLE strength to >/= 4+/5 for improved stability and ease of mobility. Baseline: Refer to above LE MMT table Goal status: INITIAL  7.  Patient will tolerate 20-30 min of (standing/sitting/walking) w/o increased pain to allow for  improved mobility and activity tolerance and be able to go shopping with his spouse Baseline: able to access community with spouse supervision/assistance but in pain Goal status: INITIAL  8.  Patient will improve Berg balance score to >/= 48/56 to allow for gait with a cane rather than needing a walker for community access  Baseline: 10/16/23:  40/56 indicating patient needs a walker all times  Goal status:  INITIAL  9.  Patient will improve 5xSTS time to </= 12 seconds for improved efficiency and safety with  transfers. Baseline: 10/16/23:  17.51 sec Goal status: INITIAL    10.  Patient will demonstrate gait speed of >/= 1.8 ft/sec (0.55 m/s) to be  a safe limited community ambulator with decreased risk for recurrent falls.  Baseline: 10/16/23:  14.38 sec Goal status: INITIAL  PLAN:  PT FREQUENCY: 1-2x/week  PT DURATION: 6 weeks  PLANNED INTERVENTIONS: 97164- PT Re-evaluation, 97750- Physical Performance Testing, 97110-Therapeutic exercises, 97530- Therapeutic activity, [id number]- Neuromuscular re-education, 97535- Self Care, [id number]- Manual therapy, [id number]- Gait training, [phone]- Electrical stimulation (unattended), 97016- Vasopneumatic device, [id number]- Ultrasound, [id number]- Traction (mechanical), [id number]- Ionotophoresis 4mg /ml Dexamethasone , Taping, Joint mobilization, Spinal mobilization, Cryotherapy, and Moist heat  PLAN FOR NEXT SESSION:  Patient is being recertified for another 4-8 weeks of PT for LBP, difficulty walking, and dizziness.   He has an MRI pending and we will proceed based on the results which will be available by his next visit.  Continue stretching back and hamstrings, continue working on balance deficits, and address dizziness once MRI results are known.   Sharlisa Hollifield, PT 10/16/2023, 1:03 PM

## 2023-10-17 ENCOUNTER — Other Ambulatory Visit: Payer: Self-pay | Admitting: Family

## 2023-10-17 ENCOUNTER — Ambulatory Visit (INDEPENDENT_AMBULATORY_CARE_PROVIDER_SITE_OTHER)

## 2023-10-17 DIAGNOSIS — R739 Hyperglycemia, unspecified: Secondary | ICD-10-CM

## 2023-10-17 NOTE — Addendum Note (Signed)
 Addended by: DARYL SETTER on: 10/17/2023 09:32 AM   Modules accepted: Orders

## 2023-10-17 NOTE — Telephone Encounter (Signed)
 Yes, I will also add the vestibular rehab.

## 2023-10-17 NOTE — Telephone Encounter (Signed)
 Please advise wife that CT head is negative for stroke- good news.  Sugar was a bit elevated, I am going to see if the lab can add on a diabetes screening test.    Is he still dizzy?  If so, I would like to go ahead with the referral for PT Vestibular rehab for vertigo.   Also, I spoke with Dr. Georjean and she is going to try to get him worked in sooner with either herself or Camie Sevin.

## 2023-10-18 LAB — HEMOGLOBIN A1C: Hgb A1c MFr Bld: 5.5 % (ref 4.6–6.5)

## 2023-10-19 ENCOUNTER — Other Ambulatory Visit: Payer: Self-pay | Admitting: Family Medicine

## 2023-10-19 DIAGNOSIS — R110 Nausea: Secondary | ICD-10-CM

## 2023-10-24 ENCOUNTER — Ambulatory Visit: Attending: Family | Admitting: Rehabilitation

## 2023-10-24 DIAGNOSIS — H8113 Benign paroxysmal vertigo, bilateral: Secondary | ICD-10-CM | POA: Diagnosis not present

## 2023-10-24 NOTE — Therapy (Signed)
 OUTPATIENT PHYSICAL THERAPY VESTIBULAR EVALUATION/ THORACOLUMBAR TREATMENT   Patient Name: Shawn [name] MRN: [id number] DOB:1934-06-15, 89 y.o., male Today's Date: 10/24/2023  END OF SESSION:  PT End of Session - 10/24/23 1438     Visit Number 15    Date for Recertification  10/17/23    Authorization Type aetna medicare    PT Start Time 1402    PT Stop Time 1444    PT Time Calculation (min) 42 min    Activity Tolerance Patient tolerated treatment well;No increased pain    Behavior During Therapy [facility] for tasks assessed/performed             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   Generalized anxiety disorder 10/30/2021   Hemorrhoids    Hepatic cyst 11/19/2007   High serum vitamin D  01/12/2020   History of blood clots    History of colon polyps 11/02/2011   hyperplastic   Hyperlipidemia    Impacted cerumen 07/29/2009   Major depressive disorder 10/30/2021   Osteoarthritis    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   Otitis externa 07/07/2021   Overweight 10/05/2010   Pain of left thumb 02/20/2016   Pulmonary embolism (HCC) 11/20/2009   Right groin pain 11/16/2021   Thrombocytopenia 12/01/2009   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   Past Surgical History:  Procedure Laterality Date   APPENDECTOMY     CATARACT EXTRACTION  02/09/2007   right eye   INGUINAL HERNIA REPAIR     INGUINAL HERNIA REPAIR Right 01/05/2022   Procedure: RIGHT HERNIA REPAIR RECURRENT INGUINAL  WITH THREE BY SIX POLYPROPYLENE MESH;  Surgeon: Sebastian Moles, MD;  Location: [facility] OR;  Service: General;  Laterality: Right;  GEN AND TAP BLOCK   TONSILLECTOMY     VARICOSE VEIN SURGERY     VASECTOMY     Patient Active Problem List   Diagnosis Date Noted   Gait disorder 10/14/2023   Dizziness 10/14/2023   Paresthesia 10/14/2023   Family history of early CAD 04/26/2023   Essential hypertension 04/26/2023   Major depressive disorder 10/30/2021   Generalized anxiety disorder 10/30/2021   Ataxia of right upper extremity 08/09/2021   Dementia due to Alzheimer's disease 03/18/2020   High serum vitamin D  01/12/2020   Pain of left thumb 02/20/2016   History of colon polyps 11/02/2011   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   Overweight 10/05/2010   Backache 04/21/2010   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  Recent months per patient  NEXT MD VISIT:    SUBJECTIVE:                                                                                                                                                                                                         SUBJECTIVE STATEMENT:  Patient and spouse report he is still having worsening balance.  They deny falls but he is much worst over the last week for no apparent reason.   They are going for head imaging downstairs when they leave  therapy today.  The patient is also still having the LBP with RLE radicular symptoms.  He and spouse report that he awakens in significant pain and does better as they day progresses with less radiculopathy.    PAIN: Are you having pain? Yes: NPRS scale: wong-baker faces;  0/10 now; 7/10 worst over the last week Pain location: low back and R hip/posterior thigh Pain description: aching Aggravating factors: can't give any Relieving factors: meds  PERTINENT HISTORY:  dementia, blood clotting disorder (Factor V), h/o DVT and PE, ataxia, LBP, moderate aortic sclerosis  PRECAUTIONS: Fall  RED FLAGS: None  WEIGHT BEARING RESTRICTIONS: No  FALLS:  Has patient fallen in last 6 months? 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   RADIATION DOSE REDUCTION: This exam was performed according to the departmental dose-optimization program which includes automated exposure control, adjustment of the mA and/or kV according to patient size and/or use of iterative reconstruction technique.   COMPARISON:  August 10, 2023   FINDINGS: Brain: There is generalized cerebral atrophy with widening of the extra-axial spaces and ventricular dilatation. There are areas of decreased attenuation within the white matter tracts of the supratentorial brain,  consistent with microvascular disease changes.   Vascular: Marked severity bilateral cavernous carotid artery calcification is noted.   Skull: Normal. Negative for fracture or focal lesion.   Sinuses/Orbits:  No acute finding.   Other: None.   IMPRESSION: 1. Generalized cerebral atrophy with widening of the extra-axial spaces and ventricular dilatation. 2. No acute intracranial abnormality.     Electronically Signed   By: Suzen Dials M.D.   On: 10/16/2023 13:10  PATIENT SURVEYS:  Patient can't reliably answer any surverys  SCREENING FOR RED FLAGS: Bowel or bladder incontinence: No Spinal tumors: No Cauda equina syndrome: No Compression fracture: No Abdominal aneurysm: No  COGNITION:  Overall cognitive status: Impaired    SENSATION: WFL  POSTURE:  rounded shoulders, forward head, and decreased lumbar lordosis  PALPATION: Unable to palpate any areas of pain today with deep pressure to his low back, pelvis, hips, and with vertebral P/A glides from T7 to L5  LUMBAR ROM:   Active  Eval  Flexion To knees; p!  Extension 30%  Right lateral flexion To mid thigh  Left lateral flexion To mid thigh  Right rotation 50%  Left rotation 50%  (Blank rows = not tested)  MUSCLE LENGTH: Hamstrings: Right SLR 40 deg; Left SLR 50 deg Thomas test: Right 10 deg; Left NT  Hamstrings: 90/90 is 45 deg RLE;  50 deg LLE ITB: NT Piriformis: moderately stiff R, mildly stiff LLE Hip flexors: minimal tightness Quads: NT Heelcord: NT  LOWER EXTREMITY ROM:     Passive  Right eval Left eval  Hip flexion    Hip extension    Hip abduction    Hip adduction    Hip internal rotation 18 24  Hip external rotation 33 39  Knee flexion    Knee extension    Ankle dorsiflexion    Ankle plantarflexion    Ankle inversion    Ankle eversion    (Blank rows = not tested)  LOWER EXTREMITY MMT:    MMT Right eval Left eval RLE 10/16/23 LLE 10/16/23  Hip flexion 4 4 4+ 4  Hip  extension 3+ 3+ 4- 4-  Hip abduction 4- 4- 4- 4-  Hip adduction      Hip internal rotation 4- 4 4 4   Hip external rotation 4 4+ 4 4+  Knee flexion 4 4 4+ 4+  Knee extension 5 5 5 5   Ankle dorsiflexion 4 4 4 4   Ankle plantarflexion      Ankle inversion      Ankle eversion       (Blank rows = not tested)  LUMBAR SPECIAL TESTS:  Straight leg raise test: Negative, Slump test: Negative, SI Compression/distraction test: Negative, and FABER test: Negative  FUNCTIONAL TESTS:  10/16/23:  BERG = 40/56  Gait speed = 1.2 ft/sec  5X STS = 17.51 sec  TUG = 14.38 sec  GAIT: Distance walked: into clinic from parking lot Assistive device utilized: None Level of assistance: CGA Gait pattern: decreased arm swing- Right, decreased arm swing- Left, decreased step length- Right, decreased step length- Left, decreased stance time- Right, decreased stance time- Left, decreased stride length, decreased ankle dorsiflexion- Right, and decreased ankle dorsiflexion- Left Comments: He ambulates with no device with unsteadiness, short shuffling gait pattern with no trunk or arm movement and very flat back, rounded shoulders   VESTIBULAR ASSESSMENT:  PATIENT SURVEYS:  DHI = 16/100  GENERAL OBSERVATION: forward head, minimal neck movement with gait or at any time   SYMPTOM BEHAVIOR:  Subjective history: patient has dementia with memory deficits so this is difficult.  Caregiver states he gets dizzy when he gets up from a seated position too quickly.  However, she states when he last had the issue that when he sat down he felt like the room was spinning for about 30 minutes.   However, he has had no symptoms in the last 1 week.   Non-Vestibular symptoms: denies any neck pain, HA, etc  Type of dizziness: Spinning/Vertigo  Frequency: spouse/caregiver reports multiple times daily when it was occurring, but it's not now  Duration: up to 1 hour  Aggravating factors: Induced by position change: lying supine,  supine to sit, and sit to stand  Relieving factors: no known relieving factors  Progression of symptoms: better--resolved per spouse  OCULOMOTOR EXAM:  Ocular Alignment: normal  Ocular ROM: No Limitations  Spontaneous Nystagmus: absent  Gaze-Induced Nystagmus: absent  Smooth Pursuits: saccades  Saccades: hypometric/undershoots  Convergence/Divergence: appears normal   VESTIBULAR - OCULAR REFLEX:   Slow VOR: Normal  VOR Cancellation: Corrective Saccades  Head-Impulse Test: HIT Right: NT  Dynamic Visual Acuity: NT   POSITIONAL TESTING: Right Dix-Hallpike: no nystagmus Left Dix-Hallpike: no nystagmus  MOTION SENSITIVITY:  Motion Sensitivity Quotient Intensity: 0 = none, 1 = Lightheaded, 2 = Mild, 3 = Moderate, 4 = Severe, 5 = Vomiting  Intensity  1. Sitting to supine 0  2. Supine to L side 0  3. Supine to R side 0  4. Supine to sitting 0  5. L Hallpike-Dix 0  6. Up from L  0  7. R Hallpike-Dix 0  8. Up from R  0  9. Sitting, head tipped to L knee 0  10. Head up from L knee 0  11. Sitting, head tipped to R knee 0  12. Head up from R knee 0  13. Sitting head turns x5 0  14.Sitting head nods x5 0  15. In stance, 180 turn to L  0  16. In stance, 180 turn to R 0    OTHOSTATICS: not done;  However we did orthostatics on a recent visit when he was much more symptomatic and they were normal  ASSESSMENT:  CLINICAL IMPRESSION: See assessment in the below sections  PLAN: We will plan to continue with PT at the regularly scheduled frequency without any changes to treatment plan or goals.   Patient is no longer symptomatic.  We will give him some gaze stabilization exercises and see how he does.  Hopefully symptoms won't return   TODAY'S TREATMENT:  10/24/23 THERAPEUTIC EXERCISE: To improve ROM.  Demonstration, verbal and tactile cues throughout for technique. NuStep L5 x 7' Supine manual hamstring stretching x 1' x 3 RLE Seated hamstring stretching x 1' x 2 RLE FABER  piriformis stretching x 1' x 2 RLE Cross over piriformis stretching x 1' x 2 RLE  10/16/23 THERAPEUTIC EXERCISE: To improve ROM.  Demonstration, verbal and tactile cues throughout for technique. NuStep L4 x 7'  SKTC flexion stretch x 10 DKTC flexion stretchx 10 Supine manual hamstretch x 1' x 3 RLE Supine finger slides to knees curl up x 2/10 Bridge with knee extension x 10 BLE Seated FABER piriformis stretch x 1' x 3 RLE Seated cross over piriformis stretch x 1' x # RLE  PHYSICAL PERFORMANCE TEST or MEASUREMENT:    10/14/23 THERAPEUTIC ACTIVITIES: To improve functional performance.  Demonstration, verbal and tactile cues throughout for technique. Supine manual PT assist hamstring stretch x 1' x 3 BLE Hooklying fingers to knees crunch x 3/5 Ball S/S X 10 BLE Peanut ball rollouts x 2/10 BLE Lumbar arch and slump x 10 Bridging x 3/10 BLE Supine  LTR x 20 BLE Standing back extension x 15 Seated ham stretch x 1' x 3 BLE Seated hip flexor stretch x 1' x 3 BLE   10/10/23 Bike L4x6min Seated hamstring stretch x 1 min B Seated piriformis stretch fig 4- 2 x 1 min B Standing lumbar extension back to counter 10x3 Leg curls 20lb 2x10 BLE Leg ext 10lb 2x10 BLE Supine LTR x 10 each way Bridging x 20 Clamshells supine GTB with TrA x 20  10/03/23 SELF CARE: Provided education on PT POC progression, to improve safety with use of SPC for assistive device, to reduce fall risk, and to promote safe home environment.  PATIENT EDUCATION:  Education details: PT eval findings, anticipated POC, and initial HEP  Person educated: Patient Education method: Explanation, Demonstration, Verbal cues, Tactile cues, and Handouts Education comprehension: verbalized understanding, verbal cues required, tactile cues required, and needs further education  HOME EXERCISE PROGRAM: Access Code: [email or web address] URL: [email or web address] Date: 10/03/2023 Prepared by: Garnette Montclair  Exercises -  Seated Hamstring Stretch  - 1 x daily - 7 x weekly - 1 sets - 1 reps - 1 min hold - Standing Lumbar Extension with Counter  - 1 x daily - 7 x weekly - 1 sets - 10 reps - Supine Lower Trunk Rotation  - 1 x daily - 7 x weekly - 1 sets - 10 reps - Seated Figure 4 Piriformis Stretch  - 1 x daily - 7 x weekly - 1 sets - 1 reps - 1 min hold - Seated Piriformis Stretch  - 1 x daily - 7 x weekly - 1 sets - 1 reps - 1 min hold - Supine Bridge  - 1 x daily - 7 x weekly - 3 sets - 10 reps   ASSESSMENT:  CLINICAL IMPRESSION: Patient has a new order for PT for BPPV.   He was having a lot of dizziness with symptoms of room spinning last 2-3 weeks.  So much so that they went to [facility] and had a head CT done.   The head CT was negative and his spouse/caregiver reports that his symptoms have resolved now.   She doesn't even feel his vertigo/dizziness is an issue now.   The patient is taken thru [facility] dix testing and there are no symptoms of dizziness or nystagmus noted.   He does have some evidence of a vestibular hypofunction since there is some nystagmus present on today's testing.   However, if he is not symptomatic I don't think he needs a lot of therapy focused on this.  His dementia already prevents him from being compliant with his home exercises and his spouse/caregiver confirms this.   We will continue PT for his regularly scheduled frequency for his back pain and balance deficits, but can add in vestibular treatments PRN.  Patient/caregiver are agreeable to this plan    RECERTIFICATION:  Patient has been seen x 2 months PT initially for unsteady gait and falls and more recently for LBP with R sided sciatica.  Initially his balance made very good improvement so we were able to focus more on his LBP.   He continues to c/o increased LBP in the AM hours that improves as the day goes on.   Spouse reports he gets up in the morning in a lot of back and RLE pain, though.   He has severely tight hamstrings and I doubt  he is remembering to stretch them at home due to his dementia which is a barrier to progress.  We are continuing to progress his lumbar exercises as able.   Spouse also reports that over the last week the patient has been significantly more unsteady for no apparent reason.  His 5X sit to stand score has improved from 18.29 to 17.51 sec, and TUG score has improved from 15.25 to 14.38 sec.   His Berg Score today is 40/56 indicating that he still needs to be using a walker.  He is very unsteady in the clinic today in comparison to the last 3 weeks.    He is going to have a brain MRI after leaving therapy today so we will know more once the results are in.   We will continue PT to address his LBP, but also his unsteady gait, dizziness, balance deficits.  He is agreeable to continue PT per POC.   OBJECTIVE IMPAIRMENTS: Abnormal gait, decreased balance, difficulty walking, decreased ROM, decreased strength, decreased safety awareness, impaired perceived functional ability, impaired flexibility, improper body mechanics, postural dysfunction, and pain.   ACTIVITY LIMITATIONS: carrying, lifting, bending, stairs, and locomotion level  PARTICIPATION LIMITATIONS: community activity  PERSONAL FACTORS: Age and 1-2 comorbidities:   dementia, blood clotting disorder (Factor V), h/o DVT and PE, ataxia, LBP, moderate aortic sclerosis are also affecting patient's functional outcome.   REHAB POTENTIAL: Good  CLINICAL DECISION MAKING: Evolving/moderate complexity  EVALUATION COMPLEXITY: Moderate   GOALS: Goals reviewed with patient? Yes  SHORT TERM GOALS: Target date: 11/16/2023   Patient will be independent with initial HEP to improve outcomes and carryover.  Baseline: 100% PT assist required for correct completion 9/8:  Needs assist  Goal status: IN PROGRESS  2.  Patient will report 25% improvement in low back pain to improve QOL. Baseline: variable pain day to day with 7/10 worst 10/14/23:  tells me worst  pain over weekend was 5/10 Goal status: IN PROGRESS  LONG TERM GOALS: Target date: 12/14/2023   Patient will be independent with ongoing/advanced HEP for self-management at home.  Baseline: no advanced lumbar HEP yet 10/14/23:  progressing with more advanced exercises Goal status: INITIAL  2.  Patient will rep/ort 50-75% improvement in low back pain to improve QOL. / Baseline: 7/10 worst:   Goal status: INITIAL  3.  Patient to demonstrate ability to achieve and maintain good spinal alignment/posturing and body mechanics needed for daily activities. Baseline: flat back, flexed hips in standing with forward head Goal status: INITIAL  4.  Patient will demonstrate increased lumbar ROM  by 10-20% in all planes to perform ADLs.   Baseline: Refer to above lumbar ROM table Goal status: INITIAL  5.  Patient will demonstrate improved BLE strength to >/= 4+/5 for improved stability and ease of mobility. Baseline: Refer to above LE MMT table Goal status: INITIAL  7.  Patient will tolerate 20-30 min of (standing/sitting/walking) w/o increased pain to allow for  improved mobility and activity tolerance and be able to go shopping with his spouse Baseline: able to access community with spouse supervision/assistance but in pain Goal status: INITIAL  8.  Patient will improve Berg balance score to >/= 48/56 to allow for gait with a cane rather than needing a walker for community access  Baseline: 10/16/23:  40/56 indicating patient needs a walker all times  Goal status:  INITIAL  9.  Patient will improve 5xSTS time to </= 12 seconds for improved efficiency and safety with transfers. Baseline: 10/16/23:  17.51 sec Goal status: INITIAL    10.  Patient will demonstrate gait speed of >/=  1.8 ft/sec (0.55 m/s) to be a safe limited community ambulator with decreased risk for recurrent falls.  Baseline: 10/16/23:  14.38 sec Goal status: INITIAL  PLAN:  PT FREQUENCY: 1-2x/week  PT DURATION: 6  weeks  PLANNED INTERVENTIONS: 97164- PT Re-evaluation, 97750- Physical Performance Testing, 97110-Therapeutic exercises, 97530- Therapeutic activity, [id number]- Neuromuscular re-education, 97535- Self Care, [id number]- Manual therapy, [id number]- Gait training, [phone]- Electrical stimulation (unattended), 97016- Vasopneumatic device, [id number]- Ultrasound, [id number]- Traction (mechanical), [id number]- Ionotophoresis 4mg /ml Dexamethasone , Taping, Joint mobilization, Spinal mobilization, Cryotherapy, and Moist heat  PLAN FOR NEXT SESSION:  Patient is being recertified for another 4-8 weeks of PT for LBP, difficulty walking, and dizziness.   He has an MRI pending and we will proceed based on the results which will be available by his next visit.  Continue stretching back and hamstrings, continue working on balance deficits, and address dizziness once MRI results are known.   Konner Saiz, PT 10/24/2023, 3:57 PM

## 2023-10-30 ENCOUNTER — Ambulatory Visit: Admitting: Rehabilitation

## 2023-10-30 DIAGNOSIS — R2681 Unsteadiness on feet: Secondary | ICD-10-CM | POA: Diagnosis not present

## 2023-10-30 DIAGNOSIS — M5459 Other low back pain: Secondary | ICD-10-CM

## 2023-10-30 DIAGNOSIS — M6281 Muscle weakness (generalized): Secondary | ICD-10-CM | POA: Diagnosis not present

## 2023-10-30 DIAGNOSIS — R269 Unspecified abnormalities of gait and mobility: Secondary | ICD-10-CM | POA: Diagnosis not present

## 2023-10-30 DIAGNOSIS — R296 Repeated falls: Secondary | ICD-10-CM | POA: Diagnosis not present

## 2023-10-30 DIAGNOSIS — H8113 Benign paroxysmal vertigo, bilateral: Secondary | ICD-10-CM | POA: Diagnosis not present

## 2023-10-30 NOTE — Therapy (Signed)
 OUTPATIENT PHYSICAL THERAPY VESTIBULAR EVALUATION/ THORACOLUMBAR TREATMENT   Patient Name: Shawn [name] MRN: [id number] DOB:1934/08/07, 89 y.o., male Today's Date: 10/30/2023  END OF SESSION:  PT End of Session - 10/30/23 1103     Visit Number 16    Date for Recertification  10/17/23    Authorization Type aetna medicare    PT Start Time 1100    PT Stop Time 1152    PT Time Calculation (min) 52 min    Activity Tolerance Patient tolerated treatment well;No increased pain    Behavior During Therapy [facility] for tasks assessed/performed             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   Generalized anxiety disorder 10/30/2021   Hemorrhoids    Hepatic cyst 11/19/2007   High serum vitamin D  01/12/2020   History of blood clots    History of colon polyps 11/02/2011   hyperplastic   Hyperlipidemia    Impacted cerumen 07/29/2009   Major depressive disorder 10/30/2021   Osteoarthritis    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   Otitis externa 07/07/2021   Overweight 10/05/2010   Pain of left thumb 02/20/2016   Pulmonary embolism (HCC) 11/20/2009   Right groin pain 11/16/2021   Thrombocytopenia 12/01/2009   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   Past Surgical History:  Procedure Laterality Date   APPENDECTOMY     CATARACT EXTRACTION  02/09/2007   right eye   INGUINAL HERNIA REPAIR     INGUINAL HERNIA REPAIR Right 01/05/2022   Procedure: RIGHT HERNIA REPAIR RECURRENT INGUINAL  WITH THREE BY SIX POLYPROPYLENE MESH;  Surgeon: Sebastian Moles, MD;  Location: [facility] OR;  Service: General;  Laterality: Right;  GEN AND TAP BLOCK   TONSILLECTOMY     VARICOSE VEIN SURGERY     VASECTOMY     Patient Active Problem List   Diagnosis Date Noted   Gait disorder 10/14/2023   Dizziness 10/14/2023   Paresthesia 10/14/2023   Family history of early CAD 04/26/2023   Essential hypertension 04/26/2023   Major depressive disorder 10/30/2021   Generalized anxiety disorder 10/30/2021   Ataxia of right upper extremity 08/09/2021   Dementia due to Alzheimer's disease 03/18/2020   High serum vitamin D  01/12/2020   Pain of left thumb 02/20/2016   History of colon polyps 11/02/2011   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   Overweight 10/05/2010   Backache 04/21/2010   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   Thrombocytopenia 12/01/2009   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   Aortic valve disorder 11/19/2007   Benign neoplasm of skin 08/11/2007   Hyperlipidemia 09/04/2006   Osteoarthritis 09/04/2006   Dysphagia 09/04/2006    PCP: Antonio Cyndee Jamee JONELLE, DO   REFERRING PROVIDER: Dina Camie BRAVO, PA-C   REFERRING DIAG: G89.29,M54.41 (ICD-10-CM) - Chronic bilateral low back pain with right-sided sciatica   THERAPY DIAG:  Benign paroxysmal positional vertigo due to bilateral vestibular disorder  Other low  back pain  Muscle weakness (generalized)  Gait abnormality  Unsteadiness on feet  Repeated falls  RATIONALE FOR EVALUATION AND TREATMENT: Rehabilitation  ONSET DATE: ?  Recent months per patient  NEXT MD VISIT:    SUBJECTIVE:                                                                                                                                                                                                         SUBJECTIVE STATEMENT:  Patient states he feels better today.   He enters the clinic with improved gait speed and  balance in comparison to the last 2 weeks.  He states his back pain feels about the same.  Spouse states he is doing better overall and only c/o dizziness once in a while...  Patient states he is not dizzy at all when asked.  Spouse denies any falls.  Patient denies any back pain since this morning when he got up.  States tylenol  is helping.   PAIN: Are you having pain? Yes: NPRS scale: wong-baker faces;  0/10 now; 7/10 worst over the last week Pain location: low back and R hip/posterior thigh Pain description: aching Aggravating factors: can't give any Relieving factors: meds  PERTINENT HISTORY:  dementia, blood clotting disorder (Factor V), h/o DVT and PE, ataxia, LBP, moderate aortic sclerosis  PRECAUTIONS: Fall  RED FLAGS: None  WEIGHT BEARING RESTRICTIONS: No  FALLS:  Has patient fallen in last 6 months? 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   RADIATION DOSE REDUCTION: This exam was performed according to the departmental dose-optimization program which includes automated exposure control, adjustment of the mA and/or kV according to patient size and/or use of iterative reconstruction technique.   COMPARISON:  August 10, 2023   FINDINGS: Brain: There is generalized cerebral atrophy with widening of the extra-axial  spaces and ventricular dilatation. There are areas of decreased attenuation within the white matter tracts of the supratentorial brain, consistent  with microvascular disease changes.   Vascular: Marked severity bilateral cavernous carotid artery calcification is noted.   Skull: Normal. Negative for fracture or focal lesion.   Sinuses/Orbits: No acute finding.   Other: None.   IMPRESSION: 1. Generalized cerebral atrophy with widening of the extra-axial spaces and ventricular dilatation. 2. No acute intracranial abnormality.     Electronically Signed   By: Suzen Dials M.D.   On: 10/16/2023 13:10  PATIENT SURVEYS:  Patient can't reliably answer any surverys  SCREENING FOR RED FLAGS: Bowel or bladder incontinence: No Spinal tumors: No Cauda equina syndrome: No Compression fracture: No Abdominal aneurysm: No  COGNITION:  Overall cognitive status: Impaired    SENSATION: WFL  POSTURE:  rounded shoulders, forward head, and decreased lumbar lordosis  PALPATION: Unable to palpate any areas of pain today with deep pressure to his low back, pelvis, hips, and with vertebral P/A glides from T7 to L5  LUMBAR ROM:   Active  Eval  Flexion To knees; p!  Extension 30%  Right lateral flexion To mid thigh  Left lateral flexion To mid thigh  Right rotation 50%  Left rotation 50%  (Blank rows = not tested)  MUSCLE LENGTH: Hamstrings: Right SLR 40 deg; Left SLR 50 deg Thomas test: Right 10 deg; Left NT  Hamstrings: 90/90 is 45 deg RLE;  50 deg LLE ITB: NT Piriformis: moderately stiff R, mildly stiff LLE Hip flexors: minimal tightness Quads: NT Heelcord: NT  LOWER EXTREMITY ROM:     Passive  Right eval Left eval  Hip flexion    Hip extension    Hip abduction    Hip adduction    Hip internal rotation 18 24  Hip external rotation 33 39  Knee flexion    Knee extension    Ankle dorsiflexion    Ankle plantarflexion    Ankle inversion    Ankle eversion     (Blank rows = not tested)  LOWER EXTREMITY MMT:    MMT Right eval Left eval RLE 10/16/23 LLE 10/16/23  Hip flexion 4 4 4+ 4  Hip extension 3+ 3+ 4- 4-  Hip abduction 4- 4- 4- 4-  Hip adduction      Hip internal rotation 4- 4 4 4   Hip external rotation 4 4+ 4 4+  Knee flexion 4 4 4+ 4+  Knee extension 5 5 5 5   Ankle dorsiflexion 4 4 4 4   Ankle plantarflexion      Ankle inversion      Ankle eversion       (Blank rows = not tested)  LUMBAR SPECIAL TESTS:  Straight leg raise test: Negative, Slump test: Negative, SI Compression/distraction test: Negative, and FABER test: Negative  FUNCTIONAL TESTS:  10/16/23:  BERG = 40/56  Gait speed = 1.2 ft/sec  5X STS = 17.51 sec  TUG = 14.38 sec  GAIT: Distance walked: into clinic from parking lot Assistive device utilized: None Level of assistance: CGA Gait pattern: decreased arm swing- Right, decreased arm swing- Left, decreased step length- Right, decreased step length- Left, decreased stance time- Right, decreased stance time- Left, decreased stride length, decreased ankle dorsiflexion- Right, and decreased ankle dorsiflexion- Left Comments: He ambulates with no device with unsteadiness, short shuffling gait pattern with no trunk or arm movement and very flat back, rounded shoulders   VESTIBULAR ASSESSMENT:  PATIENT SURVEYS:  DHI = 16/100  GENERAL OBSERVATION: forward head, minimal neck movement with gait or at any time   SYMPTOM BEHAVIOR:  Subjective history:  patient has dementia with memory deficits so this is difficult.  Caregiver states he gets dizzy when he gets up from a seated position too quickly.    However, she states when he last had the issue that when he sat down he felt like the room was spinning for about 30 minutes.   However, he has had no symptoms in the last 1 week.   Non-Vestibular symptoms: denies any neck pain, HA, etc  Type of dizziness: Spinning/Vertigo  Frequency: spouse/caregiver reports multiple times  daily when it was occurring, but it's not now  Duration: up to 1 hour  Aggravating factors: Induced by position change: lying supine, supine to sit, and sit to stand  Relieving factors: no known relieving factors  Progression of symptoms: better--resolved per spouse  OCULOMOTOR EXAM:  Ocular Alignment: normal  Ocular ROM: No Limitations  Spontaneous Nystagmus: absent  Gaze-Induced Nystagmus: absent  Smooth Pursuits: saccades  Saccades: hypometric/undershoots  Convergence/Divergence: appears normal   VESTIBULAR - OCULAR REFLEX:   Slow VOR: Normal  VOR Cancellation: Corrective Saccades  Head-Impulse Test: HIT Right: NT  Dynamic Visual Acuity: NT   POSITIONAL TESTING: Right Dix-Hallpike: no nystagmus Left Dix-Hallpike: no nystagmus  MOTION SENSITIVITY:  Motion Sensitivity Quotient Intensity: 0 = none, 1 = Lightheaded, 2 = Mild, 3 = Moderate, 4 = Severe, 5 = Vomiting  Intensity  1. Sitting to supine 0  2. Supine to L side 0  3. Supine to R side 0  4. Supine to sitting 0  5. L Hallpike-Dix 0  6. Up from L  0  7. R Hallpike-Dix 0  8. Up from R  0  9. Sitting, head tipped to L knee 0  10. Head up from L knee 0  11. Sitting, head tipped to R knee 0  12. Head up from R knee 0  13. Sitting head turns x5 0  14.Sitting head nods x5 0  15. In stance, 180 turn to L  0  16. In stance, 180 turn to R 0    OTHOSTATICS: not done;  However we did orthostatics on a recent visit when he was much more symptomatic and they were normal  ASSESSMENT:  CLINICAL IMPRESSION: See assessment in the below sections  PLAN: We will plan to continue with PT at the regularly scheduled frequency without any changes to treatment plan or goals.   Patient is no longer symptomatic.  We will give him some gaze stabilization exercises and see how he does.  Hopefully symptoms won't return   TODAY'S TREATMENT:  10/29/23 THERAPEUTIC EXERCISE: To improve strength.  Demonstration, verbal and tactile cues  throughout for technique. NuStep L6 x 8' Supine SLR PT assisted hamstring stretch x 1' x 3 BLE Supine cross over piriformis stretch x 1' x 3 RLE Supine FABER piriformis stretch x 1' x 3 RLE Supine modified Thomas hip flexor stretch x 1' x 3 RLE Supine butterfly groin stretch x 1' BLE SL hip abd x 20 RLE SL clams x 20 RLE Prone hip ext x 20 RLE  NEUROMUSCULAR RE-EDUCATION: To improve balance, posture, and proprioception. Small ball bridge x 20BLE Small ball rollouts x 20 BLE Small ball S/S X 20 BLE  MANUAL THERAPY: To promote improved flexibility and reduced pain utilizing joint mobilization, manual TP therapy, and myofascial release. Deep Tp massage to R quadratus and paraspinals;  MFR to the same area in SL and prone Prone Grade 3-4 P/A glides L2-L5 x 20 each level  10/24/23 THERAPEUTIC EXERCISE: To  improve ROM.  Demonstration, verbal and tactile cues throughout for technique. NuStep L5 x 7' Supine manual hamstring stretching x 1' x 3 RLE Seated hamstring stretching x 1' x 2 RLE FABER piriformis stretching x 1' x 2 RLE Cross over piriformis stretching x 1' x 2 RLE  10/16/23 THERAPEUTIC EXERCISE: To improve ROM.  Demonstration, verbal and tactile cues throughout for technique. NuStep L4 x 7'  SKTC flexion stretch x 10 DKTC flexion stretchx 10 Supine manual hamstretch x 1' x 3 RLE Supine finger slides to knees curl up x 2/10 Bridge with knee extension x 10 BLE Seated FABER piriformis stretch x 1' x 3 RLE Seated cross over piriformis stretch x 1' x # RLE  PHYSICAL PERFORMANCE TEST or MEASUREMENT:    10/14/23 THERAPEUTIC ACTIVITIES: To improve functional performance.  Demonstration, verbal and tactile cues throughout for technique. Supine manual PT assist hamstring stretch x 1' x 3 BLE Hooklying fingers to knees crunch x 3/5 Ball S/S X 10 BLE Peanut ball rollouts x 2/10 BLE Lumbar arch and slump x 10 Bridging x 3/10 BLE Supine LTR x 20 BLE Standing back extension x  15 Seated ham stretch x 1' x 3 BLE Seated hip flexor stretch x 1' x 3 BLE   10/10/23 Bike L4x48min Seated hamstring stretch x 1 min B Seated piriformis stretch fig 4- 2 x 1 min B Standing lumbar extension back to counter 10x3 Leg curls 20lb 2x10 BLE Leg ext 10lb 2x10 BLE Supine LTR x 10 each way Bridging x 20 Clamshells supine GTB with TrA x 20  10/03/23 SELF CARE: Provided education on PT POC progression, to improve safety with use of SPC for assistive device, to reduce fall risk, and to promote safe home environment.  PATIENT EDUCATION:  Education details: PT eval findings, anticipated POC, and initial HEP  Person educated: Patient Education method: Explanation, Demonstration, Verbal cues, Tactile cues, and Handouts Education comprehension: verbalized understanding, verbal cues required, tactile cues required, and needs further education  HOME EXERCISE PROGRAM: Access Code: [email or web address] URL: [email or web address] Date: 10/03/2023 Prepared by: Garnette Montclair  Exercises - Seated Hamstring Stretch  - 1 x daily - 7 x weekly - 1 sets - 1 reps - 1 min hold - Standing Lumbar Extension with Counter  - 1 x daily - 7 x weekly - 1 sets - 10 reps - Supine Lower Trunk Rotation  - 1 x daily - 7 x weekly - 1 sets - 10 reps - Seated Figure 4 Piriformis Stretch  - 1 x daily - 7 x weekly - 1 sets - 1 reps - 1 min hold - Seated Piriformis Stretch  - 1 x daily - 7 x weekly - 1 sets - 1 reps - 1 min hold - Supine Bridge  - 1 x daily - 7 x weekly - 3 sets - 10 reps   ASSESSMENT:  CLINICAL IMPRESSION: Patient is doing better today with his gait, balance, dizziness today.  He is able to return demonstrate his LE stretches which is the first he has been able to do this without PT assist.  Repetition with PT seems to be helping him with recall, but I suspect his spouse has been reminding him at home as well.   He has much improved L hamstring flexibility today with SLR of 70 degrees.    RLE SLR is 55 degrees.   PT remains necessary for low back pain, weakness, stiffness.   Continue per POC  REEVAL for vertigo:  Patient has a  new order for PT for BPPV.   He was having a lot of dizziness with symptoms of room spinning last 2-3 weeks.  So much so that they went to [facility] and had a head CT done.   The head CT was negative and his spouse/caregiver reports that his symptoms have resolved now.   She doesn't even feel his vertigo/dizziness is an issue now.   The patient is taken thru [facility] dix testing and there are no symptoms of dizziness or nystagmus noted.   He does have some evidence of a vestibular hypofunction since there is some nystagmus present on today's testing.   However, if he is not symptomatic I don't think he needs a lot of therapy focused on this.  His dementia already prevents him from being compliant with his home exercises and his spouse/caregiver confirms this.   We will continue PT for his regularly scheduled frequency for his back pain and balance deficits, but can add in vestibular treatments PRN.  Patient/caregiver are agreeable to this plan    RECERTIFICATION:  Patient has been seen x 2 months PT initially for unsteady gait and falls and more recently for LBP with R sided sciatica.  Initially his balance made very good improvement so we were able to focus more on his LBP.   He continues to c/o increased LBP in the AM hours that improves as the day goes on.   Spouse reports he gets up in the morning in a lot of back and RLE pain, though.   He has severely tight hamstrings and I doubt he is remembering to stretch them at home due to his dementia which is a barrier to progress.   We are continuing to progress his lumbar exercises as able.   Spouse also reports that over the last week the patient has been significantly more unsteady for no apparent reason.  His 5X sit to stand score has improved from 18.29 to 17.51 sec, and TUG score has improved from 15.25 to 14.38 sec.   His  Berg Score today is 40/56 indicating that he still needs to be using a walker.  He is very unsteady in the clinic today in comparison to the last 3 weeks.    He is going to have a brain MRI after leaving therapy today so we will know more once the results are in.   We will continue PT to address his LBP, but also his unsteady gait, dizziness, balance deficits.  He is agreeable to continue PT per POC.   OBJECTIVE IMPAIRMENTS: Abnormal gait, decreased balance, difficulty walking, decreased ROM, decreased strength, decreased safety awareness, impaired perceived functional ability, impaired flexibility, improper body mechanics, postural dysfunction, and pain.   ACTIVITY LIMITATIONS: carrying, lifting, bending, stairs, and locomotion level  PARTICIPATION LIMITATIONS: community activity  PERSONAL FACTORS: Age and 1-2 comorbidities:   dementia, blood clotting disorder (Factor V), h/o DVT and PE, ataxia, LBP, moderate aortic sclerosis are also affecting patient's functional outcome.   REHAB POTENTIAL: Good  CLINICAL DECISION MAKING: Evolving/moderate complexity  EVALUATION COMPLEXITY: Moderate   GOALS: Goals reviewed with patient? Yes  SHORT TERM GOALS: Target date: 11/16/2023   Patient will be independent with initial HEP to improve outcomes and carryover.  Baseline: 100% PT assist required for correct completion 9/8:  Needs assist  10/29/23:  patient is able to return demo his basic stretches today Goal status: MET  2.  Patient will report 25% improvement in low back pain to improve QOL. Baseline: variable pain  day to day with 7/10 worst 10/14/23:  tells me worst pain over weekend was 5/10 10/29/23:  states 0/10 today; 3/10 this am Goal status: MET  LONG TERM GOALS: Target date: 12/14/2023   Patient will be independent with ongoing/advanced HEP for self-management at home.  Baseline: no advanced lumbar HEP yet 10/14/23:  progressing with more advanced exercises Goal status: IN  PROGRESS  2.  Patient will rep/ort 50-75% improvement in low back pain to improve QOL. / Baseline: 7/10 worst:  10/29/23:  2-3/10 worst  Goal status: IN PROGRESS  3.  Patient to demonstrate ability to achieve and maintain good spinal alignment/posturing and body mechanics needed for daily activities. Baseline: flat back, flexed hips in standing with forward head Goal status: INITIAL  4.  Patient will demonstrate increased lumbar ROM  by 10-20% in all planes to perform ADLs.   Baseline: Refer to above lumbar ROM table Goal status: INITIAL  5.  Patient will demonstrate improved BLE strength to >/= 4+/5 for improved stability and ease of mobility. Baseline: Refer to above LE MMT table 10/29/23:  prone hip extension today = 4- RLE Goal status: IN PROGRESS  7.  Patient will tolerate 20-30 min of (standing/sitting/walking) w/o increased pain to allow for  improved mobility and activity tolerance and be able to go shopping with his spouse Baseline: able to access community with spouse supervision/assistance but in pain Goal status: INITIAL  8.  Patient will improve Berg balance score to >/= 48/56 to allow for gait with a cane rather than needing a walker for community access  Baseline: 10/16/23:  40/56 indicating patient needs a walker all times  Goal status:  INITIAL  9.  Patient will improve 5xSTS time to </= 12 seconds for improved efficiency and safety with transfers. Baseline: 10/16/23:  17.51 sec Goal status: INITIAL    10.  Patient will demonstrate gait speed of >/= 1.8 ft/sec (0.55 m/s) to be a safe limited community ambulator with decreased risk for recurrent falls.  Baseline: 10/16/23:  14.38 sec Goal status: INITIAL  PLAN:  PT FREQUENCY: 1-2x/week  PT DURATION: 6 weeks  PLANNED INTERVENTIONS: 97164- PT Re-evaluation, 97750- Physical Performance Testing, 97110-Therapeutic exercises, 97530- Therapeutic activity, [id number]- Neuromuscular re-education, 97535- Self Care, [id number]- Manual  therapy, [id number]- Gait training, [phone]- Electrical stimulation (unattended), 97016- Vasopneumatic device, [id number]- Ultrasound, [id number]- Traction (mechanical), [id number]- Ionotophoresis 4mg /ml Dexamethasone , Taping, Joint mobilization, Spinal mobilization, Cryotherapy, and Moist heat  PLAN FOR NEXT SESSION:  Continue to work on back strength, flexibility so long as his dizziness and balance are not bothering him as they recently have been  Kathyrn Warmuth, PT 10/30/2023, 12:19 PM

## 2023-11-03 NOTE — Progress Notes (Incomplete)
 Assessment/Plan:   Dementia likely due to ***  Shawn [name] is a very pleasant 89 y.o. RH male with a history of***seen today in follow up for memory loss. Patient is currently on***.  Patient is able to participate on ADLs***and to drive without significant difficulties.  Mood is***    Follow up in   months. Continue*** Recommend good control of her cardiovascular risk factors Continue to control mood as per PCP     Subjective:    This patient is accompanied in the office by *** who supplements the history.  Previous records as well as any outside records available were reviewed prior to todays visit. Patient was last seen on ***   Any changes in memory since last visit?  repeats oneself?  Endorsed Disoriented when walking into a room? Denies ***  Leaving objects?  May misplace things but not in unusual places***  Wandering behavior?  denies   Any personality changes since last visit?  Denies.   Any worsening depression?:  Denies.   Hallucinations or paranoia?  Denies.   Seizures? denies    Any sleep changes?  Denies vivid dreams, REM behavior or sleepwalking   Sleep apnea?   Denies.   Any hygiene concerns? Denies.  Independent of bathing and dressing?  Endorsed  Does the patient needs help with medications?   is in charge *** Who is in charge of the finances?   is in charge   *** Any changes in appetite?  denies ***   Patient have trouble swallowing? Denies.   Does the patient cook? No Any headaches?   denies   Any vision changes?*** Chronic back pain  denies   Ambulates with difficulty? Denies.  *** Recent falls or head injuries? Denies.     Unilateral weakness, numbness or tingling? denies   Any tremors?  Denies  *** Any anosmia?  Denies   Any incontinence of urine?  Endorsed***  Any bowel dysfunction?   Denies      Patient lives   *** Does the patient drive? No longer drives ***   PREVIOUS MEDICATIONS:   CURRENT MEDICATIONS:  Outpatient  Encounter Medications as of 11/07/2023  Medication Sig   ascorbic acid (VITAMIN C) 500 MG tablet Take 500 mg by mouth daily with lunch.   Cholecalciferol 125 MCG (5000 UT) TABS Take 5,000 Units by mouth every Monday, Tuesday, Wednesday, Thursday, and Friday.  With lunch   cyanocobalamin  (VITAMIN B12) 1000 MCG tablet Take 1,000 mcg by mouth daily.   donepezil  (ARICEPT ) 10 MG tablet Take 1 tablet (10 mg total) by mouth daily.   ELIQUIS  5 MG TABS tablet TAKE 1 TABLET BY MOUTH TWICE A DAY   ezetimibe  (ZETIA ) 10 MG tablet Take 1 tablet (10 mg total) by mouth daily.   gabapentin  (NEURONTIN ) 100 MG capsule Take 1-2 capsules (100-200 mg total) by mouth at bedtime as needed. (Patient not taking: Reported on 10/14/2023)   memantine  (NAMENDA ) 10 MG tablet Take 1 tablet twice a day   Multiple Vitamin (MULTIVITAMIN WITH MINERALS) TABS tablet Take 1 tablet by mouth daily with lunch.   pantoprazole  (PROTONIX ) 40 MG tablet Take 1 tablet (40 mg total) by mouth daily.   sertraline  (ZOLOFT ) 50 MG tablet Take 1 tablet (50 mg total) by mouth daily.   No facility-administered encounter medications on file as of 11/07/2023.       02/19/2023   12:00 PM 08/16/2022    1:00 PM 02/22/2022   12:00 PM  MMSE -  Mini Mental State Exam  Orientation to time 3 1 1   Orientation to Place 3 3 5   Registration 3 3 3   Attention/ Calculation 4 5 5   Recall 1 2 1   Language- name 2 objects 2 2 2   Language- repeat 1 1 1   Language- follow 3 step command 3 3 3   Language- read & follow direction 1 1 1   Write a sentence 1 1 1   Copy design 0 0 0  Total score 22 22 23       02/29/2020   11:00 AM  Montreal Cognitive Assessment   Visuospatial/ Executive (0/5) 1  Naming (0/3) 3  Attention: Read list of digits (0/2) 1  Attention: Read list of letters (0/1) 1  Attention: Serial 7 subtraction starting at 100 (0/3) 1  Language: Repeat phrase (0/2) 1  Language : Fluency (0/1) 0  Abstraction (0/2) 1  Delayed Recall (0/5) 0  Orientation  (0/6) 4  Total 13  Adjusted Score (based on education) 14    Objective:     PHYSICAL EXAMINATION:    VITALS:  There were no vitals filed for this visit.  GEN:  The patient appears stated age and is in NAD. HEENT:  Normocephalic, atraumatic.   Neurological examination:  General: NAD, well-groomed, appears stated age. Orientation: The patient is alert. Oriented to person, place and date Cranial nerves: There is good facial symmetry.The speech is fluent and clear. No aphasia or dysarthria. Fund of knowledge is appropriate. Recent and remote memory are impaired. Attention and concentration are reduced. Able to name objects and repeat phrases.  Hearing is intact to conversational tone. *** Sensation: Sensation is intact to light touch throughout Motor: Strength is at least antigravity x4. DTR's 2/4 in UE/LE     Movement examination: Tone: There is normal tone in the UE/LE Abnormal movements:  no tremor.  No myoclonus.  No asterixis.   Coordination:  There is no decremation with RAM's. Normal finger to nose  Gait and Station: The patient has no*** difficulty arising out of a deep-seated chair without the use of the hands. The patient's stride length is good.  Gait is cautious and narrow.    Thank you for allowing us  the opportunity to participate in the care of this nice patient. Please do not hesitate to contact us  for any questions or concerns.   Total time spent on today's visit was *** minutes dedicated to this patient today, preparing to see patient, examining the patient, ordering tests and/or medications and counseling the patient, documenting clinical information in the EHR or other health record, independently interpreting results and communicating results to the patient/family, discussing treatment and goals, answering patient's questions and coordinating care.  Cc:  Antonio Cyndee Jamee JONELLE, DO  [facility] 11/03/2023 1:28 PM

## 2023-11-04 ENCOUNTER — Ambulatory Visit: Admitting: Rehabilitation

## 2023-11-04 ENCOUNTER — Encounter: Payer: Self-pay | Admitting: Rehabilitation

## 2023-11-04 DIAGNOSIS — M5459 Other low back pain: Secondary | ICD-10-CM

## 2023-11-04 DIAGNOSIS — H8113 Benign paroxysmal vertigo, bilateral: Secondary | ICD-10-CM

## 2023-11-04 DIAGNOSIS — R269 Unspecified abnormalities of gait and mobility: Secondary | ICD-10-CM | POA: Diagnosis not present

## 2023-11-04 DIAGNOSIS — R296 Repeated falls: Secondary | ICD-10-CM | POA: Diagnosis not present

## 2023-11-04 DIAGNOSIS — R2681 Unsteadiness on feet: Secondary | ICD-10-CM | POA: Diagnosis not present

## 2023-11-04 DIAGNOSIS — M6281 Muscle weakness (generalized): Secondary | ICD-10-CM

## 2023-11-04 NOTE — Therapy (Signed)
 OUTPATIENT PHYSICAL THERAPY VESTIBULAR EVALUATION/ THORACOLUMBAR TREATMENT   Patient Name: Shawn [name] MRN: [id number] DOB:[birth year], 89 y.o., male Today's Date: 11/04/2023  END OF SESSION:  PT End of Session - 11/04/23 1218     Visit Number 17    Date for Recertification  10/17/23    Authorization Type aetna medicare    PT Start Time 1150    PT Stop Time 1240    PT Time Calculation (min) 50 min    Activity Tolerance Patient tolerated treatment well;No increased pain    Behavior During Therapy [facility] for tasks assessed/performed             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   Generalized anxiety disorder 10/30/2021   Hemorrhoids    Hepatic cyst 11/19/2007   High serum vitamin D  01/12/2020   History of blood clots    History of colon polyps 11/02/2011   hyperplastic   Hyperlipidemia    Impacted cerumen 07/29/2009   Major depressive disorder 10/30/2021   Osteoarthritis    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   Otitis externa 07/07/2021   Overweight 10/05/2010   Pain of left thumb 02/20/2016   Pulmonary embolism (HCC) 11/20/2009   Right groin pain 11/16/2021   Thrombocytopenia 12/01/2009   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   Past Surgical History:  Procedure Laterality Date   APPENDECTOMY     CATARACT EXTRACTION  02/09/2007   right eye   INGUINAL HERNIA REPAIR     INGUINAL HERNIA REPAIR Right 01/05/2022   Procedure: RIGHT HERNIA REPAIR RECURRENT INGUINAL  WITH THREE BY SIX POLYPROPYLENE MESH;  Surgeon: Sebastian Moles, MD;  Location: [facility] OR;  Service: General;  Laterality: Right;  GEN AND TAP BLOCK   TONSILLECTOMY     VARICOSE VEIN SURGERY     VASECTOMY     Patient Active Problem List   Diagnosis Date Noted   Gait disorder 10/14/2023   Dizziness 10/14/2023   Paresthesia 10/14/2023   Family history of early CAD 04/26/2023   Essential hypertension 04/26/2023   Major depressive disorder 10/30/2021   Generalized anxiety disorder 10/30/2021   Ataxia of right upper extremity 08/09/2021   Dementia due to Alzheimer's disease 03/18/2020   High serum vitamin D  01/12/2020   Pain of left thumb 02/20/2016   History of colon polyps 11/02/2011   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   Overweight 10/05/2010   Backache 04/21/2010   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   Thrombocytopenia 12/01/2009   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   Aortic valve disorder 11/19/2007   Benign neoplasm of skin 08/11/2007   Hyperlipidemia 09/04/2006   Osteoarthritis 09/04/2006   Dysphagia 09/04/2006    PCP: Antonio Cyndee Jamee JONELLE, DO   REFERRING PROVIDER: Daryl Setter, NP   REFERRING DIAG: G89.29,M54.41 (ICD-10-CM) - Chronic bilateral low back pain with right-sided sciatica   THERAPY DIAG:  Benign paroxysmal positional vertigo due to bilateral vestibular disorder  Other low back  pain  Muscle weakness (generalized)  Gait abnormality  Unsteadiness on feet  Repeated falls  RATIONALE FOR EVALUATION AND TREATMENT: Rehabilitation  ONSET DATE: ?  Recent months per patient  NEXT MD VISIT:    SUBJECTIVE:                                                                                                                                                                                                         SUBJECTIVE STATEMENT:  Patient reports feels good today.   He does not usually report feeling good.  Spouse also  confirms that he is doing generally better.  She states he is not c/o as much back pain in the mornings, but still has it.   He has decreased from needing 2 tylenol  to only 1 now      PAIN: Are you having pain? Yes: NPRS scale: wong-baker faces;  0/10 now; 7/10 worst over the last week Pain location: low back and R hip/posterior thigh Pain description: aching Aggravating factors: can't give any Relieving factors: meds  PERTINENT HISTORY:  dementia, blood clotting disorder (Factor V), h/o DVT and PE, ataxia, LBP, moderate aortic sclerosis  PRECAUTIONS: Fall  RED FLAGS: None  WEIGHT BEARING RESTRICTIONS: No  FALLS:  Has patient fallen in last 6 months? 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   RADIATION DOSE REDUCTION: This exam was performed according to the departmental dose-optimization program which includes automated exposure control, adjustment of the mA and/or kV according to patient size and/or use of iterative reconstruction technique.   COMPARISON:  August 10, 2023   FINDINGS: Brain: There is generalized cerebral atrophy with widening of the extra-axial spaces and ventricular dilatation. There are areas of decreased attenuation within the white matter tracts of the supratentorial brain, consistent with  microvascular disease changes.   Vascular: Marked severity bilateral cavernous carotid artery calcification is noted.   Skull: Normal. Negative for fracture or focal lesion.  Sinuses/Orbits: No acute finding.   Other: None.   IMPRESSION: 1. Generalized cerebral atrophy with widening of the extra-axial spaces and ventricular dilatation. 2. No acute intracranial abnormality.     Electronically Signed   By: Suzen Dials M.D.   On: 10/16/2023 13:10  PATIENT SURVEYS:  Patient can't reliably answer any surverys  SCREENING FOR RED FLAGS: Bowel or bladder incontinence: No Spinal tumors: No Cauda equina syndrome: No Compression fracture: No Abdominal aneurysm: No  COGNITION:  Overall cognitive status: Impaired    SENSATION: WFL  POSTURE:  rounded shoulders, forward head, and decreased lumbar lordosis  PALPATION: Unable to palpate any areas of pain today with deep pressure to his low back, pelvis, hips, and with vertebral P/A glides from T7 to L5  LUMBAR ROM:   Active  Eval  Flexion To knees; p!  Extension 30%  Right lateral flexion To mid thigh  Left lateral flexion To mid thigh  Right rotation 50%  Left rotation 50%  (Blank rows = not tested)  MUSCLE LENGTH: Hamstrings: Right SLR 40 deg; Left SLR 50 deg Thomas test: Right 10 deg; Left NT  Hamstrings: 90/90 is 45 deg RLE;  50 deg LLE ITB: NT Piriformis: moderately stiff R, mildly stiff LLE Hip flexors: minimal tightness Quads: NT Heelcord: NT  LOWER EXTREMITY ROM:     Passive  Right eval Left eval  Hip flexion    Hip extension    Hip abduction    Hip adduction    Hip internal rotation 18 24  Hip external rotation 33 39  Knee flexion    Knee extension    Ankle dorsiflexion    Ankle plantarflexion    Ankle inversion    Ankle eversion    (Blank rows = not tested)  LOWER EXTREMITY MMT:    MMT Right eval Left eval RLE 10/16/23 LLE 10/16/23  Hip flexion 4 4 4+ 4  Hip extension 3+ 3+ 4- 4-   Hip abduction 4- 4- 4- 4-  Hip adduction      Hip internal rotation 4- 4 4 4   Hip external rotation 4 4+ 4 4+  Knee flexion 4 4 4+ 4+  Knee extension 5 5 5 5   Ankle dorsiflexion 4 4 4 4   Ankle plantarflexion      Ankle inversion      Ankle eversion       (Blank rows = not tested)  LUMBAR SPECIAL TESTS:  Straight leg raise test: Negative, Slump test: Negative, SI Compression/distraction test: Negative, and FABER test: Negative  FUNCTIONAL TESTS:  10/16/23:  BERG = 40/56  Gait speed = 1.2 ft/sec  5X STS = 17.51 sec  TUG = 14.38 sec  GAIT: Distance walked: into clinic from parking lot Assistive device utilized: None Level of assistance: CGA Gait pattern: decreased arm swing- Right, decreased arm swing- Left, decreased step length- Right, decreased step length- Left, decreased stance time- Right, decreased stance time- Left, decreased stride length, decreased ankle dorsiflexion- Right, and decreased ankle dorsiflexion- Left Comments: He ambulates with no device with unsteadiness, short shuffling gait pattern with no trunk or arm movement and very flat back, rounded shoulders   VESTIBULAR ASSESSMENT:  PATIENT SURVEYS:  DHI = 16/100  GENERAL OBSERVATION: forward head, minimal neck movement with gait or at any time   SYMPTOM BEHAVIOR:  Subjective history: patient has dementia with memory deficits so this is difficult.  Caregiver states he gets dizzy when he gets up from a seated position too quickly.  However, she states when he last had the issue that when he sat down he felt like the room was spinning for about 30 minutes.   However, he has had no symptoms in the last 1 week.   Non-Vestibular symptoms: denies any neck pain, HA, etc  Type of dizziness: Spinning/Vertigo  Frequency: spouse/caregiver reports multiple times daily when it was occurring, but it's not now  Duration: up to 1 hour  Aggravating factors: Induced by position change: lying supine, supine to sit, and sit  to stand  Relieving factors: no known relieving factors  Progression of symptoms: better--resolved per spouse  OCULOMOTOR EXAM:  Ocular Alignment: normal  Ocular ROM: No Limitations  Spontaneous Nystagmus: absent  Gaze-Induced Nystagmus: absent  Smooth Pursuits: saccades  Saccades: hypometric/undershoots  Convergence/Divergence: appears normal   VESTIBULAR - OCULAR REFLEX:   Slow VOR: Normal  VOR Cancellation: Corrective Saccades  Head-Impulse Test: HIT Right: NT  Dynamic Visual Acuity: NT   POSITIONAL TESTING: Right Dix-Hallpike: no nystagmus Left Dix-Hallpike: no nystagmus  MOTION SENSITIVITY:  Motion Sensitivity Quotient Intensity: 0 = none, 1 = Lightheaded, 2 = Mild, 3 = Moderate, 4 = Severe, 5 = Vomiting  Intensity  1. Sitting to supine 0  2. Supine to L side 0  3. Supine to R side 0  4. Supine to sitting 0  5. L Hallpike-Dix 0  6. Up from L  0  7. R Hallpike-Dix 0  8. Up from R  0  9. Sitting, head tipped to L knee 0  10. Head up from L knee 0  11. Sitting, head tipped to R knee 0  12. Head up from R knee 0  13. Sitting head turns x5 0  14.Sitting head nods x5 0  15. In stance, 180 turn to L  0  16. In stance, 180 turn to R 0    OTHOSTATICS: not done;  However we did orthostatics on a recent visit when he was much more symptomatic and they were normal  ASSESSMENT:  CLINICAL IMPRESSION: See assessment in the below sections  PLAN: We will plan to continue with PT at the regularly scheduled frequency without any changes to treatment plan or goals.   Patient is no longer symptomatic.  We will give him some gaze stabilization exercises and see how he does.  Hopefully symptoms won't return   TODAY'S TREATMENT:  11/04/23 THERAPEUTIC ACTIVITIES: To improve functional performance.  Demonstration, verbal and tactile cues throughout for technique. Supine SLR PT assisted hamstring stretch x 1' x 3 BLE Supine cross over piriformis stretch x 1' x 3 RLE Supine FABER  piriformis stretch x 1' x 3 RLE Supine modified Thomas hip flexor stretch x 1' x 3 RLE Supine butterfly groin stretch x 1' BLE SL hip abd x 20 RLE SL clams x 20 RLE Prone hip ext x 20 RLE  MANUAL THERAPY: To promote improved flexibility and reduced pain utilizing joint mobilization, manual TP therapy, and myofascial release. Deep Tp massage to R quadratus and paraspinals;  MFR to the same area in SL and prone Prone Grade 3-4 P/A glides L2-L5 x 20 each level  10/29/23 THERAPEUTIC EXERCISE: To improve strength.  Demonstration, verbal and tactile cues throughout for technique. NuStep L6 x 8' Supine SLR PT assisted hamstring stretch x 1' x 3 BLE Supine cross over piriformis stretch x 1' x 3 RLE Supine FABER piriformis stretch x 1' x 3 RLE Supine modified Thomas hip flexor stretch x 1' x 3 RLE Supine butterfly  groin stretch x 1' BLE SL hip abd x 20 RLE SL clams x 20 RLE Prone hip ext x 20 RLE  NEUROMUSCULAR RE-EDUCATION: To improve balance, posture, and proprioception. Small ball bridge x 20BLE Small ball rollouts x 20 BLE Small ball S/S X 20 BLE  MANUAL THERAPY: To promote improved flexibility and reduced pain utilizing joint mobilization, manual TP therapy, and myofascial release. Deep Tp massage to R quadratus and paraspinals;  MFR to the same area in SL and prone Prone Grade 3-4 P/A glides L2-L5 x 20 each level  10/24/23 THERAPEUTIC EXERCISE: To improve ROM.  Demonstration, verbal and tactile cues throughout for technique. NuStep L5 x 7' Supine manual hamstring stretching x 1' x 3 RLE Seated hamstring stretching x 1' x 2 RLE FABER piriformis stretching x 1' x 2 RLE Cross over piriformis stretching x 1' x 2 RLE  10/16/23 THERAPEUTIC EXERCISE: To improve ROM.  Demonstration, verbal and tactile cues throughout for technique. NuStep L4 x 7'  SKTC flexion stretch x 10 DKTC flexion stretchx 10 Supine manual hamstretch x 1' x 3 RLE Supine finger slides to knees curl up x  2/10 Bridge with knee extension x 10 BLE Seated FABER piriformis stretch x 1' x 3 RLE Seated cross over piriformis stretch x 1' x # RLE  PHYSICAL PERFORMANCE TEST or MEASUREMENT:    10/14/23 THERAPEUTIC ACTIVITIES: To improve functional performance.  Demonstration, verbal and tactile cues throughout for technique. Supine manual PT assist hamstring stretch x 1' x 3 BLE Hooklying fingers to knees crunch x 3/5 Ball S/S X 10 BLE Peanut ball rollouts x 2/10 BLE Lumbar arch and slump x 10 Bridging x 3/10 BLE Supine LTR x 20 BLE Standing back extension x 15 Seated ham stretch x 1' x 3 BLE Seated hip flexor stretch x 1' x 3 BLE   10/10/23 Bike L4x21min Seated hamstring stretch x 1 min B Seated piriformis stretch fig 4- 2 x 1 min B Standing lumbar extension back to counter 10x3 Leg curls 20lb 2x10 BLE Leg ext 10lb 2x10 BLE Supine LTR x 10 each way Bridging x 20 Clamshells supine GTB with TrA x 20  10/03/23 SELF CARE: Provided education on PT POC progression, to improve safety with use of SPC for assistive device, to reduce fall risk, and to promote safe home environment.  PATIENT EDUCATION:  Education details: PT eval findings, anticipated POC, and initial HEP  Person educated: Patient Education method: Explanation, Demonstration, Verbal cues, Tactile cues, and Handouts Education comprehension: verbalized understanding, verbal cues required, tactile cues required, and needs further education  HOME EXERCISE PROGRAM: Access Code: [email or web address] URL: [email or web address] Date: 10/03/2023 Prepared by: Garnette Montclair  Exercises - Seated Hamstring Stretch  - 1 x daily - 7 x weekly - 1 sets - 1 reps - 1 min hold - Standing Lumbar Extension with Counter  - 1 x daily - 7 x weekly - 1 sets - 10 reps - Supine Lower Trunk Rotation  - 1 x daily - 7 x weekly - 1 sets - 10 reps - Seated Figure 4 Piriformis Stretch  - 1 x daily - 7 x weekly - 1 sets - 1 reps - 1 min hold - Seated  Piriformis Stretch  - 1 x daily - 7 x weekly - 1 sets - 1 reps - 1 min hold - Supine Bridge  - 1 x daily - 7 x weekly - 3 sets - 10 reps   ASSESSMENT:  CLINICAL IMPRESSION: Patient is progressing well with  his LBP and doing the best he has done since starting PT.  He has no c/o dizziness.  C/o mainly of feeling like he is falling forward.   This is largely due to his forward head, thoracic kyphosis, and intermittently shuffling gait.   However, he ambulates into the clinic today with much better gait pattern.  He has a referral to neurologist for r/o Parkinson's which I think is a good referral.  He has some characteristics present that are similar in nature to PD.  His flexibility is gradually improving with stretching.  He gets relief with myofascial relief to his R low back.  Will continue PT to address these issues.    REEVAL for vertigo:  Patient has a new order for PT for BPPV.   He was having a lot of dizziness with symptoms of room spinning last 2-3 weeks.  So much so that they went to [facility] and had a head CT done.   The head CT was negative and his spouse/caregiver reports that his symptoms have resolved now.   She doesn't even feel his vertigo/dizziness is an issue now.   The patient is taken thru [facility] dix testing and there are no symptoms of dizziness or nystagmus noted.   He does have some evidence of a vestibular hypofunction since there is some nystagmus present on today's testing.   However, if he is not symptomatic I don't think he needs a lot of therapy focused on this.  His dementia already prevents him from being compliant with his home exercises and his spouse/caregiver confirms this.   We will continue PT for his regularly scheduled frequency for his back pain and balance deficits, but can add in vestibular treatments PRN.  Patient/caregiver are agreeable to this plan    RECERTIFICATION:  Patient has been seen x 2 months PT initially for unsteady gait and falls and more recently for  LBP with R sided sciatica.  Initially his balance made very good improvement so we were able to focus more on his LBP.   He continues to c/o increased LBP in the AM hours that improves as the day goes on.   Spouse reports he gets up in the morning in a lot of back and RLE pain, though.   He has severely tight hamstrings and I doubt he is remembering to stretch them at home due to his dementia which is a barrier to progress.   We are continuing to progress his lumbar exercises as able.   Spouse also reports that over the last week the patient has been significantly more unsteady for no apparent reason.  His 5X sit to stand score has improved from 18.29 to 17.51 sec, and TUG score has improved from 15.25 to 14.38 sec.   His Berg Score today is 40/56 indicating that he still needs to be using a walker.  He is very unsteady in the clinic today in comparison to the last 3 weeks.    He is going to have a brain MRI after leaving therapy today so we will know more once the results are in.   We will continue PT to address his LBP, but also his unsteady gait, dizziness, balance deficits.  He is agreeable to continue PT per POC.   OBJECTIVE IMPAIRMENTS: Abnormal gait, decreased balance, difficulty walking, decreased ROM, decreased strength, decreased safety awareness, impaired perceived functional ability, impaired flexibility, improper body mechanics, postural dysfunction, and pain.   ACTIVITY LIMITATIONS: carrying, lifting, bending, stairs, and locomotion level  PARTICIPATION LIMITATIONS: community activity  PERSONAL FACTORS: Age and 1-2 comorbidities:   dementia, blood clotting disorder (Factor V), h/o DVT and PE, ataxia, LBP, moderate aortic sclerosis are also affecting patient's functional outcome.   REHAB POTENTIAL: Good  CLINICAL DECISION MAKING: Evolving/moderate complexity  EVALUATION COMPLEXITY: Moderate   GOALS: Goals reviewed with patient? Yes  SHORT TERM GOALS: Target date:  11/16/2023   Patient will be independent with initial HEP to improve outcomes and carryover.  Baseline: 100% PT assist required for correct completion 9/8:  Needs assist  10/29/23:  patient is able to return demo his basic stretches today Goal status: MET  2.  Patient will report 25% improvement in low back pain to improve QOL. Baseline: variable pain day to day with 7/10 worst 10/14/23:  tells me worst pain over weekend was 5/10 10/29/23:  states 0/10 today; 3/10 this am Goal status: MET  LONG TERM GOALS: Target date: 12/14/2023   Patient will be independent with ongoing/advanced HEP for self-management at home.  Baseline: no advanced lumbar HEP yet 10/14/23:  progressing with more advanced exercises Goal status: IN PROGRESS  2.  Patient will rep/ort 50-75% improvement in low back pain to improve QOL. / Baseline: 7/10 worst:  10/29/23:  2-3/10 worst  Goal status: IN PROGRESS  3.  Patient to demonstrate ability to achieve and maintain good spinal alignment/posturing and body mechanics needed for daily activities. Baseline: flat back, flexed hips in standing with forward head Goal status: INITIAL  4.  Patient will demonstrate increased lumbar ROM  by 10-20% in all planes to perform ADLs.   Baseline: Refer to above lumbar ROM table Goal status: INITIAL  5.  Patient will demonstrate improved BLE strength to >/= 4+/5 for improved stability and ease of mobility. Baseline: Refer to above LE MMT table 10/29/23:  prone hip extension today = 4- RLE Goal status: IN PROGRESS  7.  Patient will tolerate 20-30 min of (standing/sitting/walking) w/o increased pain to allow for  improved mobility and activity tolerance and be able to go shopping with his spouse Baseline: able to access community with spouse supervision/assistance but in pain Goal status: INITIAL  8.  Patient will improve Berg balance score to >/= 48/56 to allow for gait with a cane rather than needing a walker for community  access  Baseline: 10/16/23:  40/56 indicating patient needs a walker all times  Goal status:  INITIAL  9.  Patient will improve 5xSTS time to </= 12 seconds for improved efficiency and safety with transfers. Baseline: 10/16/23:  17.51 sec Goal status: INITIAL    10.  Patient will demonstrate gait speed of >/= 1.8 ft/sec (0.55 m/s) to be a safe limited community ambulator with decreased risk for recurrent falls.  Baseline: 10/16/23:  14.38 sec Goal status: INITIAL  PLAN:  PT FREQUENCY: 1-2x/week  PT DURATION: 6 weeks  PLANNED INTERVENTIONS: 97164- PT Re-evaluation, 97750- Physical Performance Testing, 97110-Therapeutic exercises, 97530- Therapeutic activity, [id number]- Neuromuscular re-education, 97535- Self Care, [id number]- Manual therapy, [id number]- Gait training, [id number]- Electrical stimulation (unattended), 97016- Vasopneumatic device, [id number]- Ultrasound, 02987- Traction (mechanical), [id number]- Ionotophoresis 4mg /ml Dexamethasone , Taping, Joint mobilization, Spinal mobilization, Cryotherapy, and Moist heat  PLAN FOR NEXT SESSION:  Continue to work on back strength, flexibility so long as his dizziness and balance are not bothering him as they recently have been  Evlyn Amason, PT 11/04/2023, 5:24 PM

## 2023-11-06 ENCOUNTER — Encounter: Payer: Self-pay | Admitting: Family Medicine

## 2023-11-06 ENCOUNTER — Ambulatory Visit (INDEPENDENT_AMBULATORY_CARE_PROVIDER_SITE_OTHER): Admitting: Family Medicine

## 2023-11-06 VITALS — BP 130/68 | HR 68 | Temp 97.4°F | Resp 16 | Ht 67.0 in | Wt 176.0 lb

## 2023-11-06 DIAGNOSIS — M545 Low back pain, unspecified: Secondary | ICD-10-CM

## 2023-11-06 NOTE — Progress Notes (Signed)
 Musculoskeletal Exam  Patient: Shawn [name] DOB: 1934/12/17  DOS: 11/06/2023  SUBJECTIVE:  Chief Complaint:   Chief Complaint  Patient presents with   Back Pain    Back Pain    Shawn [name] is a 89 y.o.  male for evaluation and treatment of back pain. Here w spouse to helps w hx.   Onset:  2 days ago. Had PT work on the area. .  Location: lower R Character:  sharp  Progression of issue:  is unchanged Associated symptoms: bothersome when walking, not to the touch.  No bruising, redness, swelling.  Denies bowel/bladder incontinence or weakness Treatment: to date has been acetaminophen .   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   Generalized anxiety disorder 10/30/2021   Hemorrhoids    Hepatic cyst 11/19/2007   High serum vitamin D  01/12/2020   History of blood clots    History of colon polyps 11/02/2011   hyperplastic   Hyperlipidemia    Impacted cerumen 07/29/2009   Major depressive disorder 10/30/2021   Osteoarthritis    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 No acute distress. HENT: Normocephalic, atraumatic.  Skin: In the lumbar region, there is no erythema, ecchymosis, fluctuance, excessive warmth or drainage. Thorax & Lungs:  No accessory muscle use Musculoskeletal: low back.   Tenderness to palpation: There is either a cystic structure or hypertonicity of the lumbar spinal musculature just lateral to the PSIS on the right that is TTP.  This is present on the left side without the TTP. Deformity: no Ecchymosis: no Straight leg test: negative for Poor hamstring flexibility b/l. Neurologic: Normal sensory function. No focal deficits noted. DTR's equal and symmetric in LE's. No clonus.  Gait is slow and cautious. Psychiatric: Normal mood.   Assessment:  Acute right-sided low back pain without sciatica  Plan: Continue home exercise plan, heat, ice, Tylenol , topical NSAIDs.  He wanted to make sure nothing popped in his back and could be causing risk for infection.  I assured him this is not the case. F/u prn. The patient and his spouse voiced understanding and agreement to the plan.   Mabel [place], DO 11/06/23  12:30 PM

## 2023-11-06 NOTE — Patient Instructions (Addendum)
 Ice/cold pack over area for 10-15 min twice daily.  OK to take Tylenol  1000 mg (2 extra strength tabs) or 975 mg (3 regular strength tabs) every 6 hours as needed.  Heat (pad or rice pillow in microwave) over affected area, 10-15 minutes twice daily.   Consider some Voltaren (diclofenac) gel.   This is not concerning for popping a cyst or anything of that nature.   Let us  know if you need anything.

## 2023-11-07 ENCOUNTER — Ambulatory Visit: Attending: Family | Admitting: Rehabilitation

## 2023-11-07 ENCOUNTER — Encounter: Payer: Self-pay | Admitting: Rehabilitation

## 2023-11-07 ENCOUNTER — Ambulatory Visit: Admitting: Physician Assistant

## 2023-11-07 DIAGNOSIS — M6281 Muscle weakness (generalized): Secondary | ICD-10-CM | POA: Insufficient documentation

## 2023-11-07 DIAGNOSIS — R269 Unspecified abnormalities of gait and mobility: Secondary | ICD-10-CM | POA: Diagnosis present

## 2023-11-07 DIAGNOSIS — M5459 Other low back pain: Secondary | ICD-10-CM | POA: Diagnosis present

## 2023-11-07 DIAGNOSIS — R2681 Unsteadiness on feet: Secondary | ICD-10-CM | POA: Diagnosis present

## 2023-11-07 DIAGNOSIS — H8113 Benign paroxysmal vertigo, bilateral: Secondary | ICD-10-CM | POA: Diagnosis present

## 2023-11-07 DIAGNOSIS — R296 Repeated falls: Secondary | ICD-10-CM | POA: Insufficient documentation

## 2023-11-07 NOTE — Progress Notes (Unsigned)
 Assessment/Plan:   Dementia likely due to Alzheimer's disease   Shawn [name] is a very pleasant 89 y.o. RH male with a history of hyperlipidemia, PE and DVT, Factor V Leiden deficiency on Eliquis  and a diagnosis of dementia likely due to Alzheimer's disease per latest neuropsych evaluation February 2025 seen today in follow up for memory loss. Patient is currently on donepezil  10 mg daily and memantine  10 mg bid. Cognitive decline is noted with MMSE today of 18/30.  Progression of the disease is suspected.  Discussed with his wife increasing Aricept  to 23 mg daily.  The patient just refilled his Aricept , we discussed titrating it up to 20 mg daily, and if tolerated, during the next prescription, will write for 23 mg a day in an effort to slow down any cognitive decline.  He is on milligrams twice daily, will continue with he needs some assistance with ADLs, given lower back pain, aggravated by recent PT sessions and manipulation of the right back, but he reports that he is feeling better, and the pain is controlled with Tylenol .     Follow up in 6 months. Continue memantine  10 mg twice daily, will try increasing Aricept  to a goal of 23 mg daily if tolerated, side effects discussed Recommend good control of her cardiovascular risk factors Continue to control mood as per PCP Continue PT sessions for strength and balance.     Subjective:    This patient is accompanied in the office by his wife who supplements the history.  Previous records as well as any outside records available were reviewed prior to todays visit. Patient was last seen on Jan 2025    Any changes in memory since last visit?  About the same-patient reports, wife suspects that he may be slightly worse.. Does a lot of jigsaw puzzles-wife says.  Both short-term and long-term may be affected as well. repeats oneself?  Endorsed, more frequently, per wife's report Disoriented when walking into a room? Denies     Leaving objects?  May misplace things but not in unusual places   Wandering behavior?  denies   Any personality changes since last visit?  Denies.   Any worsening depression?:  Denies. About the same.  He has a history of depression attributed to the current diagnosis, inability to drive and friends dying.  He declined psychotherapy in the past.  Medicine helps -he says Hallucinations or paranoia?  Some moments of sundowning, not frequent. Seizures? denies    Any sleep changes?  A week ago he had a dream in which was not getting up and tangled on tree limbs, and was kicking his legs trying to get lose, had to waking up .  No other REM behavior or sleepwalking   Sleep apnea?   Denies.   Any hygiene concerns? Denies.  Independent of bathing and dressing?  Endorsed  Does the patient needs help with medications?  Wife is in charge   Who is in charge of the finances?  Wife is in charge     Any changes in appetite?  denies he favors sweets.  He denies any dysphagia. Does the patient cook? No Any headaches?   denies   Any vision changes?  Denies Chronic back pain  Endorsed, the back pain is slowing him up. In the morning he feel s better then in the late afternoon at which time he uses a cane -wife says.  Recently, he had physical therapy and the lower back was manipulated massage ,  which created severe pain in the area, but this is ameliorating.  He takes Tylenol  to help with the pain. Ambulates with difficulty?  Because of the above, he has some difficulty ambulating, now slowly improving.  Noncemented feet. Recent falls or head injuries?    Shawn [name] a few days ago on carpeting no LOC or head injury  Strokelike symptoms?  Denies Any tremors?  Noted Left hand tremor without any other parkinsonian signs reported.    Any anosmia?  Denies   Any incontinence of urine?  Endorsed, wears diapers   Any bowel dysfunction?   Denies      Patient lives with his wife     Does the patient drive? No  longer drives      Neuropsych evaluation 03/26/23, [name] Briefly, results suggested severe impairment surrounding all aspects of learning and memory. Additional impairments were exhibited across cognitive flexibility and semantic fluency, while further performance variability was exhibited across processing speed, confrontation naming, and visuospatial abilities. Relative to his previous evaluation in January 2022, mild decline was exhibited across confrontation naming, as well as both delayed retrieval and recognition/consolidation aspects of memory. Outside of these areas, performances were largely stable across the two evaluations.     History on Initial Assessment 04/22/2020: This is an 89 year old right-handed man with a history of hyperlipidemia, PE and DVT, Factor V Leiden deficiency, presenting for evaluation of dementia. His wife is present to provide additional information. Records were reviewed, he underwent Neuropsychological evaluation in January 2022 with very significant cognitive impairment with profound visuospatial problems and additional low scores on measures of processing speed, executive function, and working memory. 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 Atypical Alzheimer's is also a possibility. Vascular disease may be contributing but does not well explain his higher cortical signs. MRI brain in 2017 showed moderate chronic microvascular disease and mild diffuse volume loss. Repeat MRI brain in 2022 showed progression of chronic microvascular disease, no acute changes.    He feels his memory is good, not perfect. His wife started noticing forgetfulness and  word-finding difficulties a couple of years ago. His handwriting has always been terrible, a little more illegible than before. He continues to drive and denies getting lost driving. He manages his own medications and denies missing doses. They do bills together. His wife denies any difficulties following instructions or using the remote control/microwave. He is independent with dressing and bathing. No personality changes, mood is same as always, no paranoia or hallucinations.   They started noticing involuntary hand movements a few months ago. He has intermittent right arm myoclonus throughout the visit. They note he moves his fingers without realizing. Movements do not affect writing or using utensils. He has not noticed them in his legs. No falls. Sleep is good, no REM behavior disorder. He does fall asleep when he reads during the day. No staring/unresponsive episodes, gaps in time, olfactory/gustatory hallucinations. No change in gait, he denies any stiffness/difficulty with getting out of bed.    PREVIOUS MEDICATIONS:   CURRENT MEDICATIONS:  Outpatient Encounter Medications as of 11/08/2023  Medication Sig   ascorbic acid (VITAMIN C) 500 MG tablet Take 500 mg by mouth daily with lunch.   Cholecalciferol 125 MCG (5000 UT) TABS Take 5,000 Units by  mouth every Monday, Tuesday, Wednesday, Thursday, and Friday.  With lunch   cyanocobalamin  (VITAMIN B12) 1000 MCG tablet Take 1,000 mcg by mouth daily.   donepezil  (ARICEPT ) 10 MG tablet Take 1 tablet (10 mg total) by mouth daily.   ELIQUIS  5 MG TABS tablet TAKE 1 TABLET BY MOUTH TWICE A DAY   ezetimibe  (ZETIA ) 10 MG tablet Take 1 tablet (10 mg total) by mouth daily.   gabapentin  (NEURONTIN ) 100 MG capsule Take 1-2 capsules (100-200 mg total) by mouth at bedtime as needed.   memantine  (NAMENDA ) 10 MG tablet Take 1 tablet twice a day   Multiple Vitamin (MULTIVITAMIN WITH MINERALS) TABS tablet Take 1 tablet by mouth daily with lunch.   pantoprazole   (PROTONIX ) 40 MG tablet Take 1 tablet (40 mg total) by mouth daily.   sertraline  (ZOLOFT ) 50 MG tablet Take 1 tablet (50 mg total) by mouth daily.   No facility-administered encounter medications on file as of 11/08/2023.       11/08/2023   12:00 PM 02/19/2023   12:00 PM 08/16/2022    1:00 PM  MMSE - Mini Mental State Exam  Orientation to time 2 3 1   Orientation to Place 1 3 3   Registration 3 3 3   Attention/ Calculation 5 4 5   Recall 0 1 2  Language- name 2 objects 2 2 2   Language- repeat 1 1 1   Language- follow 3 step command 3 3 3   Language- read & follow direction 1 1 1   Write a sentence 0 1 1  Copy design 0 0 0  Total score 18 22 22       02/29/2020   11:00 AM  Montreal Cognitive Assessment   Visuospatial/ Executive (0/5) 1  Naming (0/3) 3  Attention: Read list of digits (0/2) 1  Attention: Read list of letters (0/1) 1  Attention: Serial 7 subtraction starting at 100 (0/3) 1  Language: Repeat phrase (0/2) 1  Language : Fluency (0/1) 0  Abstraction (0/2) 1  Delayed Recall (0/5) 0  Orientation (0/6) 4  Total 13  Adjusted Score (based on education) 14    Objective:     PHYSICAL EXAMINATION:    VITALS:   Vitals:   11/08/23 0932  BP: 120/80  Pulse: 74  Resp: 18  SpO2: 98%    GEN:  The patient appears stated age and is in NAD. HEENT:  Normocephalic, atraumatic.   Neurological examination:  General: NAD, well-groomed, appears stated age. Orientation: The patient is alert. Oriented to person, not to place and date Cranial nerves: There is good facial symmetry.The speech is fluent and clear.  Anxious appearing.  No aphasia or dysarthria. Fund of knowledge is reduced. Recent and remote memory are impaired. Attention and concentration are reduced. Able to name objects and repeat phrases.  Hearing is intact to conversational tone.   Sensation: Sensation is intact to light touch throughout Motor: Strength is at least antigravity x4. DTR's 2/4 in UE/LE      Movement examination: Tone: There is normal tone in the UE/LE Abnormal movements: Minimal chronic intention tremor bilaterally, left greater than right no myoclonus.  No asterixis.   Coordination:  There is no decremation with RAM's. Normal finger to nose  Gait and Station: The patient has no difficulty arising out of a deep-seated chair without the use of the hands.  Decreased arm swing as before.  The patient's stride length is shorter, as prior visits, gait is cautious and narrow, mildly wide-based..    Thank you  for allowing us  the opportunity to participate in the care of this nice patient. Please do not hesitate to contact us  for any questions or concerns.   Total time spent on today's visit was 33 minutes dedicated to this patient today, preparing to see patient, examining the patient, ordering tests and/or medications and counseling the patient, documenting clinical information in the EHR or other health record, independently interpreting results and communicating results to the patient/family, discussing treatment and goals, answering patient's questions and coordinating care.  Cc:  Antonio Cyndee Jamee JONELLE ROSALEA [facility] 11/08/2023 12:04 PM

## 2023-11-07 NOTE — Therapy (Signed)
 OUTPATIENT PHYSICAL THERAPY VESTIBULAR EVALUATION/ THORACOLUMBAR TREATMENT   Patient Name: Shawn [name] MRN: [id number] DOB:07-29-1934, 89 y.o., male Today's Date: 11/07/2023  END OF SESSION:  PT End of Session - 11/07/23 1241     Visit Number 18    Date for Recertification  10/17/23    Authorization Type aetna medicare    PT Start Time 1102    PT Stop Time 1144    PT Time Calculation (min) 42 min    Activity Tolerance Patient tolerated treatment well;No increased pain    Behavior During Therapy [facility] for tasks assessed/performed              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   Generalized anxiety disorder 10/30/2021   Hemorrhoids    Hepatic cyst 11/19/2007   High serum vitamin D  01/12/2020   History of blood clots    History of colon polyps 11/02/2011   hyperplastic   Hyperlipidemia    Impacted cerumen 07/29/2009   Major depressive disorder 10/30/2021   Osteoarthritis    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   Otitis externa 07/07/2021   Overweight 10/05/2010   Pain of left thumb 02/20/2016   Pulmonary embolism (HCC) 11/20/2009   Right groin pain 11/16/2021   Thrombocytopenia 12/01/2009   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   Past Surgical History:  Procedure Laterality Date   APPENDECTOMY     CATARACT EXTRACTION  02/09/2007   right eye   INGUINAL HERNIA REPAIR     INGUINAL HERNIA REPAIR Right 01/05/2022   Procedure: RIGHT HERNIA REPAIR RECURRENT  INGUINAL WITH THREE BY SIX POLYPROPYLENE MESH;  Surgeon: Sebastian Moles, MD;  Location: [facility] OR;  Service: General;  Laterality: Right;  GEN AND TAP BLOCK   TONSILLECTOMY     VARICOSE VEIN SURGERY     VASECTOMY     Patient Active Problem List   Diagnosis Date Noted   Gait disorder 10/14/2023   Dizziness 10/14/2023   Paresthesia 10/14/2023   Family history of early CAD 04/26/2023   Essential hypertension 04/26/2023   Major depressive disorder 10/30/2021   Generalized anxiety disorder 10/30/2021   Ataxia of right upper extremity 08/09/2021   Dementia due to Alzheimer's disease 03/18/2020   High serum vitamin D  01/12/2020   Pain of left thumb 02/20/2016   History of colon polyps 11/02/2011   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   Overweight 10/05/2010   Backache 04/21/2010   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   Thrombocytopenia 12/01/2009   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   Aortic valve disorder 11/19/2007   Benign neoplasm of skin 08/11/2007   Hyperlipidemia 09/04/2006   Osteoarthritis 09/04/2006   Dysphagia 09/04/2006    PCP: Antonio Cyndee Jamee JONELLE, DO   REFERRING PROVIDER: Daryl Setter, NP   REFERRING DIAG: G89.29,M54.41 (ICD-10-CM) - Chronic bilateral low back pain with right-sided sciatica   THERAPY DIAG:  Benign paroxysmal positional vertigo due to bilateral vestibular disorder  Other low  back pain  Muscle weakness (generalized)  Gait abnormality  RATIONALE FOR EVALUATION AND TREATMENT: Rehabilitation  ONSET DATE: ?  Recent months per patient  NEXT MD VISIT:    SUBJECTIVE:                                                                                                                                                                                                         SUBJECTIVE STATEMENT:  Patient states he awoke yesterday with severe R low back pain and couldn't hardly walk.   States went and saw Dr Frann  and was advised to rest and take tylenol .   He did so and is feeling better today.   States he thinks last therapy treatment with manual techniques was too much and would like to stop any further manual therapy treatment.      PAIN: Are you having pain? Yes: NPRS scale: wong-baker faces;  0/10 now; 7/10 worst over the last week Pain location: low back and R hip/posterior thigh Pain description: aching Aggravating factors: can't give any Relieving factors: meds  PERTINENT HISTORY:  dementia, blood clotting disorder (Factor V), h/o DVT and PE, ataxia, LBP, moderate aortic sclerosis  PRECAUTIONS: Fall  RED FLAGS: None  WEIGHT BEARING RESTRICTIONS: No  FALLS:  Has patient fallen in last 6 months? 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   RADIATION DOSE REDUCTION: This exam was performed according to the departmental dose-optimization program which includes automated exposure control, adjustment of the mA and/or kV according to patient size and/or use of iterative reconstruction technique.   COMPARISON:  August 10, 2023   FINDINGS: Brain: There is generalized cerebral atrophy with widening of the extra-axial spaces and ventricular dilatation. There are areas of decreased attenuation within the white matter tracts of the  supratentorial brain, consistent with microvascular disease changes.   Vascular: Marked severity bilateral cavernous carotid artery calcification is noted.   Skull: Normal. Negative for fracture or  focal lesion.   Sinuses/Orbits: No acute finding.   Other: None.   IMPRESSION: 1. Generalized cerebral atrophy with widening of the extra-axial spaces and ventricular dilatation. 2. No acute intracranial abnormality.     Electronically Signed   By: Suzen Dials M.D.   On: 10/16/2023 13:10  PATIENT SURVEYS:  Patient can't reliably answer any surverys  SCREENING FOR RED FLAGS: Bowel or bladder incontinence: No Spinal tumors: No Cauda equina syndrome: No Compression fracture: No Abdominal aneurysm: No  COGNITION:  Overall cognitive status: Impaired    SENSATION: WFL  POSTURE:  rounded shoulders, forward head, and decreased lumbar lordosis  PALPATION: Unable to palpate any areas of pain today with deep pressure to his low back, pelvis, hips, and with vertebral P/A glides from T7 to L5  LUMBAR ROM:   Active  Eval  Flexion To knees; p!  Extension 30%  Right lateral flexion To mid thigh  Left lateral flexion To mid thigh  Right rotation 50%  Left rotation 50%  (Blank rows = not tested)  MUSCLE LENGTH: Hamstrings: Right SLR 40 deg; Left SLR 50 deg Thomas test: Right 10 deg; Left NT  Hamstrings: 90/90 is 45 deg RLE;  50 deg LLE ITB: NT Piriformis: moderately stiff R, mildly stiff LLE Hip flexors: minimal tightness Quads: NT Heelcord: NT  LOWER EXTREMITY ROM:     Passive  Right eval Left eval  Hip flexion    Hip extension    Hip abduction    Hip adduction    Hip internal rotation 18 24  Hip external rotation 33 39  Knee flexion    Knee extension    Ankle dorsiflexion    Ankle plantarflexion    Ankle inversion    Ankle eversion    (Blank rows = not tested)  LOWER EXTREMITY MMT:    MMT Right eval Left eval RLE 10/16/23 LLE 10/16/23  Hip flexion  4 4 4+ 4  Hip extension 3+ 3+ 4- 4-  Hip abduction 4- 4- 4- 4-  Hip adduction      Hip internal rotation 4- 4 4 4   Hip external rotation 4 4+ 4 4+  Knee flexion 4 4 4+ 4+  Knee extension 5 5 5 5   Ankle dorsiflexion 4 4 4 4   Ankle plantarflexion      Ankle inversion      Ankle eversion       (Blank rows = not tested)  LUMBAR SPECIAL TESTS:  Straight leg raise test: Negative, Slump test: Negative, SI Compression/distraction test: Negative, and FABER test: Negative  FUNCTIONAL TESTS:  10/16/23:  BERG = 40/56  Gait speed = 1.2 ft/sec  5X STS = 17.51 sec  TUG = 14.38 sec  GAIT: Distance walked: into clinic from parking lot Assistive device utilized: None Level of assistance: CGA Gait pattern: decreased arm swing- Right, decreased arm swing- Left, decreased step length- Right, decreased step length- Left, decreased stance time- Right, decreased stance time- Left, decreased stride length, decreased ankle dorsiflexion- Right, and decreased ankle dorsiflexion- Left Comments: He ambulates with no device with unsteadiness, short shuffling gait pattern with no trunk or arm movement and very flat back, rounded shoulders   VESTIBULAR ASSESSMENT:  PATIENT SURVEYS:  DHI = 16/100  GENERAL OBSERVATION: forward head, minimal neck movement with gait or at any time   SYMPTOM BEHAVIOR:  Subjective history: patient has dementia with memory deficits so this is difficult.  Caregiver states he gets dizzy when he gets up from a seated position  too quickly.    However, she states when he last had the issue that when he sat down he felt like the room was spinning for about 30 minutes.   However, he has had no symptoms in the last 1 week.   Non-Vestibular symptoms: denies any neck pain, HA, etc  Type of dizziness: Spinning/Vertigo  Frequency: spouse/caregiver reports multiple times daily when it was occurring, but it's not now  Duration: up to 1 hour  Aggravating factors: Induced by position change:  lying supine, supine to sit, and sit to stand  Relieving factors: no known relieving factors  Progression of symptoms: better--resolved per spouse  OCULOMOTOR EXAM:  Ocular Alignment: normal  Ocular ROM: No Limitations  Spontaneous Nystagmus: absent  Gaze-Induced Nystagmus: absent  Smooth Pursuits: saccades  Saccades: hypometric/undershoots  Convergence/Divergence: appears normal   VESTIBULAR - OCULAR REFLEX:   Slow VOR: Normal  VOR Cancellation: Corrective Saccades  Head-Impulse Test: HIT Right: NT  Dynamic Visual Acuity: NT   POSITIONAL TESTING: Right Dix-Hallpike: no nystagmus Left Dix-Hallpike: no nystagmus  MOTION SENSITIVITY:  Motion Sensitivity Quotient Intensity: 0 = none, 1 = Lightheaded, 2 = Mild, 3 = Moderate, 4 = Severe, 5 = Vomiting  Intensity  1. Sitting to supine 0  2. Supine to L side 0  3. Supine to R side 0  4. Supine to sitting 0  5. L Hallpike-Dix 0  6. Up from L  0  7. R Hallpike-Dix 0  8. Up from R  0  9. Sitting, head tipped to L knee 0  10. Head up from L knee 0  11. Sitting, head tipped to R knee 0  12. Head up from R knee 0  13. Sitting head turns x5 0  14.Sitting head nods x5 0  15. In stance, 180 turn to L  0  16. In stance, 180 turn to R 0    OTHOSTATICS: not done;  However we did orthostatics on a recent visit when he was much more symptomatic and they were normal  ASSESSMENT:  CLINICAL IMPRESSION: See assessment in the below sections  PLAN: We will plan to continue with PT at the regularly scheduled frequency without any changes to treatment plan or goals.   Patient is no longer symptomatic.  We will give him some gaze stabilization exercises and see how he does.  Hopefully symptoms won't return   TODAY'S TREATMENT:  11/07/23 THERAPEUTIC EXERCISE: To improve strength.  Demonstration, verbal and tactile cues throughout for technique. Bike L0 x 8'  NEUROMUSCULAR RE-EDUCATION: To improve balance, posture, proprioception, and  reduce fall risk. Tandem gait at counter F/B  Back to counter toe raises x 2/10 BLE Back to counter heel raises x 2/10 BLE Counter back extension x 10  Standing hip ext stretch at counter x 1' x 2 BLE Seated leg on table ham stretch x 1' x 3 Seated pigeon stretch 1' x 3 RLE Standing toe prop on 1/2 foam roll calf stretch x 1' x 2 Seated rowing 15# close grip x 20;  Wide grip x 20 BUE Standing donkey kick w/ chest press machine bar 5# x 3/10 RLE  11/04/23 THERAPEUTIC ACTIVITIES: To improve functional performance.  Demonstration, verbal and tactile cues throughout for technique. Supine SLR PT assisted hamstring stretch x 1' x 3 BLE Supine cross over piriformis stretch x 1' x 3 RLE Supine FABER piriformis stretch x 1' x 3 RLE Supine modified Thomas hip flexor stretch x 1' x 3 RLE Supine butterfly groin  stretch x 1' BLE SL hip abd x 20 RLE SL clams x 20 RLE Prone hip ext x 20 RLE  MANUAL THERAPY: To promote improved flexibility and reduced pain utilizing joint mobilization, manual TP therapy, and myofascial release. Deep Tp massage to R quadratus and paraspinals;  MFR to the same area in SL and prone Prone Grade 3-4 P/A glides L2-L5 x 20 each level  10/29/23 THERAPEUTIC EXERCISE: To improve strength.  Demonstration, verbal and tactile cues throughout for technique. NuStep L6 x 8' Supine SLR PT assisted hamstring stretch x 1' x 3 BLE Supine cross over piriformis stretch x 1' x 3 RLE Supine FABER piriformis stretch x 1' x 3 RLE Supine modified Thomas hip flexor stretch x 1' x 3 RLE Supine butterfly groin stretch x 1' BLE SL hip abd x 20 RLE SL clams x 20 RLE Prone hip ext x 20 RLE  NEUROMUSCULAR RE-EDUCATION: To improve balance, posture, and proprioception. Small ball bridge x 20BLE Small ball rollouts x 20 BLE Small ball S/S X 20 BLE  MANUAL THERAPY: To promote improved flexibility and reduced pain utilizing joint mobilization, manual TP therapy, and myofascial release. Deep Tp  massage to R quadratus and paraspinals;  MFR to the same area in SL and prone Prone Grade 3-4 P/A glides L2-L5 x 20 each level  10/24/23 THERAPEUTIC EXERCISE: To improve ROM.  Demonstration, verbal and tactile cues throughout for technique. NuStep L5 x 7' Supine manual hamstring stretching x 1' x 3 RLE Seated hamstring stretching x 1' x 2 RLE FABER piriformis stretching x 1' x 2 RLE Cross over piriformis stretching x 1' x 2 RLE  10/16/23 THERAPEUTIC EXERCISE: To improve ROM.  Demonstration, verbal and tactile cues throughout for technique. NuStep L4 x 7'  SKTC flexion stretch x 10 DKTC flexion stretchx 10 Supine manual hamstretch x 1' x 3 RLE Supine finger slides to knees curl up x 2/10 Bridge with knee extension x 10 BLE Seated FABER piriformis stretch x 1' x 3 RLE Seated cross over piriformis stretch x 1' x # RLE  PHYSICAL PERFORMANCE TEST or MEASUREMENT:    10/14/23 THERAPEUTIC ACTIVITIES: To improve functional performance.  Demonstration, verbal and tactile cues throughout for technique. Supine manual PT assist hamstring stretch x 1' x 3 BLE Hooklying fingers to knees crunch x 3/5 Ball S/S X 10 BLE Peanut ball rollouts x 2/10 BLE Lumbar arch and slump x 10 Bridging x 3/10 BLE Supine LTR x 20 BLE Standing back extension x 15 Seated ham stretch x 1' x 3 BLE Seated hip flexor stretch x 1' x 3 BLE  PATIENT EDUCATION:  Education details: PT eval findings, anticipated POC, and initial HEP  Person educated: Patient Education method: Explanation, Demonstration, Verbal cues, Tactile cues, and Handouts Education comprehension: verbalized understanding, verbal cues required, tactile cues required, and needs further education  HOME EXERCISE PROGRAM: Access Code: [email or web address] URL: [email or web address] Date: 10/03/2023 Prepared by: Garnette Montclair  Exercises - Seated Hamstring Stretch  - 1 x daily - 7 x weekly - 1 sets - 1 reps - 1 min hold - Standing Lumbar  Extension with Counter  - 1 x daily - 7 x weekly - 1 sets - 10 reps - Supine Lower Trunk Rotation  - 1 x daily - 7 x weekly - 1 sets - 10 reps - Seated Figure 4 Piriformis Stretch  - 1 x daily - 7 x weekly - 1 sets - 1 reps - 1 min hold - Seated Piriformis Stretch  -  1 x daily - 7 x weekly - 1 sets - 1 reps - 1 min hold - Supine Bridge  - 1 x daily - 7 x weekly - 3 sets - 10 reps   ASSESSMENT:  CLINICAL IMPRESSION: Patient had increased pain after last visit so much so that spouse brought him in to see PCP yesterday. Therefore manual treatment to his R low back is stopped.   Will continue to focus the remainder of his episode of care on strength, gentle stretching, and balance deficits.   He has had no further vertigo or dizziness per his report and his spouse corroborates this.  He has not had any falls either.   He is has noticeably better standing posture today with much improved back extension.   He is not slouching nearly so badly as usual so I think his treatments with us  have been helping.   We will continue per his plan of care.  REEVAL for vertigo:  Patient has a new order for PT for BPPV.   He was having a lot of dizziness with symptoms of room spinning last 2-3 weeks.  So much so that they went to [facility] and had a head CT done.   The head CT was negative and his spouse/caregiver reports that his symptoms have resolved now.   She doesn't even feel his vertigo/dizziness is an issue now.   The patient is taken thru [facility] dix testing and there are no symptoms of dizziness or nystagmus noted.   He does have some evidence of a vestibular hypofunction since there is some nystagmus present on today's testing.   However, if he is not symptomatic I don't think he needs a lot of therapy focused on this.  His dementia already prevents him from being compliant with his home exercises and his spouse/caregiver confirms this.   We will continue PT for his regularly scheduled frequency for his back pain and  balance deficits, but can add in vestibular treatments PRN.  Patient/caregiver are agreeable to this plan    RECERTIFICATION:  Patient has been seen x 2 months PT initially for unsteady gait and falls and more recently for LBP with R sided sciatica.  Initially his balance made very good improvement so we were able to focus more on his LBP.   He continues to c/o increased LBP in the AM hours that improves as the day goes on.   Spouse reports he gets up in the morning in a lot of back and RLE pain, though.   He has severely tight hamstrings and I doubt he is remembering to stretch them at home due to his dementia which is a barrier to progress.   We are continuing to progress his lumbar exercises as able.   Spouse also reports that over the last week the patient has been significantly more unsteady for no apparent reason.  His 5X sit to stand score has improved from 18.29 to 17.51 sec, and TUG score has improved from 15.25 to 14.38 sec.   His Berg Score today is 40/56 indicating that he still needs to be using a walker.  He is very unsteady in the clinic today in comparison to the last 3 weeks.    He is going to have a brain MRI after leaving therapy today so we will know more once the results are in.   We will continue PT to address his LBP, but also his unsteady gait, dizziness, balance deficits.  He is agreeable to continue  PT per POC.   OBJECTIVE IMPAIRMENTS: Abnormal gait, decreased balance, difficulty walking, decreased ROM, decreased strength, decreased safety awareness, impaired perceived functional ability, impaired flexibility, improper body mechanics, postural dysfunction, and pain.   ACTIVITY LIMITATIONS: carrying, lifting, bending, stairs, and locomotion level  PARTICIPATION LIMITATIONS: community activity  PERSONAL FACTORS: Age and 1-2 comorbidities:   dementia, blood clotting disorder (Factor V), h/o DVT and PE, ataxia, LBP, moderate aortic sclerosis are also affecting patient's functional  outcome.   REHAB POTENTIAL: Good  CLINICAL DECISION MAKING: Evolving/moderate complexity  EVALUATION COMPLEXITY: Moderate   GOALS: Goals reviewed with patient? Yes  SHORT TERM GOALS: Target date: 11/16/2023   Patient will be independent with initial HEP to improve outcomes and carryover.  Baseline: 100% PT assist required for correct completion 9/8:  Needs assist  10/29/23:  patient is able to return demo his basic stretches today Goal status: MET  2.  Patient will report 25% improvement in low back pain to improve QOL. Baseline: variable pain day to day with 7/10 worst 10/14/23:  tells me worst pain over weekend was 5/10 10/29/23:  states 0/10 today; 3/10 this am Goal status: MET  LONG TERM GOALS: Target date: 12/14/2023   Patient will be independent with ongoing/advanced HEP for self-management at home.  Baseline: no advanced lumbar HEP yet 10/14/23:  progressing with more advanced exercises Goal status: IN PROGRESS  2.  Patient will rep/ort 50-75% improvement in low back pain to improve QOL. / Baseline: 7/10 worst:  10/29/23:  2-3/10 worst  Goal status: IN PROGRESS  3.  Patient to demonstrate ability to achieve and maintain good spinal alignment/posturing and body mechanics needed for daily activities. Baseline: flat back, flexed hips in standing with forward head Goal status: INITIAL  4.  Patient will demonstrate increased lumbar ROM  by 10-20% in all planes to perform ADLs.   Baseline: Refer to above lumbar ROM table Goal status: INITIAL  5.  Patient will demonstrate improved BLE strength to >/= 4+/5 for improved stability and ease of mobility. Baseline: Refer to above LE MMT table 10/29/23:  prone hip extension today = 4- RLE Goal status: IN PROGRESS  7.  Patient will tolerate 20-30 min of (standing/sitting/walking) w/o increased pain to allow for  improved mobility and activity tolerance and be able to go shopping with his spouse Baseline: able to access community  with spouse supervision/assistance but in pain Goal status: INITIAL  8.  Patient will improve Berg balance score to >/= 48/56 to allow for gait with a cane rather than needing a walker for community access  Baseline: 10/16/23:  40/56 indicating patient needs a walker all times  Goal status:  INITIAL  9.  Patient will improve 5xSTS time to </= 12 seconds for improved efficiency and safety with transfers. Baseline: 10/16/23:  17.51 sec Goal status: INITIAL    10.  Patient will demonstrate gait speed of >/= 1.8 ft/sec (0.55 m/s) to be a safe limited community ambulator with decreased risk for recurrent falls.  Baseline: 10/16/23:  14.38 sec Goal status: INITIAL  PLAN:  PT FREQUENCY: 1-2x/week  PT DURATION: 6 weeks  PLANNED INTERVENTIONS: 97164- PT Re-evaluation, 97750- Physical Performance Testing, 97110-Therapeutic exercises, 97530- Therapeutic activity, [id number]- Neuromuscular re-education, 97535- Self Care, [id number]- Manual therapy, [id number]- Gait training, [id number]- Electrical stimulation (unattended), 97016- Vasopneumatic device, [id number]- Ultrasound, [id number]- Traction (mechanical), [id number]- Ionotophoresis 4mg /ml Dexamethasone , Taping, Joint mobilization, Spinal mobilization, Cryotherapy, and Moist heat  PLAN FOR NEXT SESSION:  Recheck gait speed,5X STS, BERG, strength.  Begin preparing for D/C from PT   Sumit Branham, PT 11/07/2023, 9:37 PM

## 2023-11-08 ENCOUNTER — Encounter: Payer: Self-pay | Admitting: Physician Assistant

## 2023-11-08 ENCOUNTER — Ambulatory Visit: Admitting: Physician Assistant

## 2023-11-08 VITALS — BP 120/80 | HR 74 | Resp 18

## 2023-11-08 DIAGNOSIS — G309 Alzheimer's disease, unspecified: Secondary | ICD-10-CM

## 2023-11-08 DIAGNOSIS — F0280 Dementia in other diseases classified elsewhere without behavioral disturbance: Secondary | ICD-10-CM | POA: Diagnosis not present

## 2023-11-08 NOTE — Patient Instructions (Signed)
 It was a pleasure to see you today at our office.   Recommendations:  Follow up in 6  months  Continue donepezil  increase to 15 for 2 weeks, then to 20  mg daily if tolerated will change the donepezil  dose accordingly.   Continue memantine  10 mg twice a day.    Continue  physical therapy for strength and balance        Whom to call:  Memory  decline, memory medications: Call our office [phone]   For psychiatric meds, mood meds: Please have your primary care physician manage these medications.   Counseling regarding caregiver distress, including caregiver depression, anxiety and issues regarding community resources, adult day care programs, adult living facilities, or memory care questions:   Feel free to contact Misty Waddell Simmer, Social Worker at [phone]      Consider [facility]  [address] [place], [place] [address] [phone]  Hours of Operation Mondays to Thursdays: 8 am to 8 pm,Fridays: 9 am to 8 pm, Saturdays: 9 am to 1 pm Sundays: Closed  [email or web address]  If you have any severe symptoms of a stroke, or other severe issues such as confusion,severe chills or fever, etc call 911 or go to the ER as you may need to be evaluated further   Feel free to visit Facebook page  [employer] for tips of how to care for people with memory problems.     RECOMMENDATIONS FOR ALL PATIENTS WITH MEMORY PROBLEMS: 1. Continue to exercise (Recommend 30 minutes of walking everyday, or 3 hours every week) 2. Increase social interactions - continue going to [place] and enjoy social gatherings with friends and family 3. Eat healthy, avoid fried foods and eat more fruits and vegetables 4. Maintain adequate blood pressure, blood sugar, and blood cholesterol level. Reducing the risk of stroke and cardiovascular disease also helps promoting better memory. 5. Avoid stressful situations.  Live a simple life and avoid aggravations. Organize your time and prepare for the next day in anticipation. 6. Sleep well, avoid any interruptions of sleep and avoid any distractions in the bedroom that may interfere with adequate sleep quality 7. Avoid sugar, avoid sweets as there is a strong link between excessive sugar intake, diabetes, and cognitive impairment We discussed the Mediterranean diet, which has been shown to help patients reduce the risk of progressive memory disorders and reduces cardiovascular risk. This includes eating fish, eat fruits and green leafy vegetables, nuts like almonds and hazelnuts, walnuts, and also use olive oil. Avoid fast foods and fried foods as much as possible. Avoid sweets and sugar as sugar use has been linked to worsening of memory function.  There is always a concern of gradual progression of memory problems. If this is the case, then we may need to adjust level of care according to patient needs. Support, both to the patient and caregiver, should then be put into place.    The Alzheimer[employer] Association is here all day, every day for people facing Alzheimer's disease through our free 24/7 Helpline: [phone]. The Helpline provides reliable information and support to all those who need assistance, such as individuals living with memory loss, Alzheimer's or other dementia, caregivers, health care professionals and the public.  Our highly trained and knowledgeable staff can help you with: Understanding memory loss, dementia and Alzheimer's  Medications and other treatment options  General information about aging and brain health  Skills to provide quality care and to find the best care from professionals  Legal, financial and living-arrangement  decisions Our Helpline also features: Confidential care consultation provided by master's level clinicians who can help with decision-making support, crisis assistance and education on issues families face every day   Help in a caller's preferred language using our translation service that features more than 200 languages and dialects  Referrals to local community programs, services and ongoing support     FALL PRECAUTIONS: Be cautious when walking. Scan the area for obstacles that may increase the risk of trips and falls. When getting up in the mornings, sit up at the edge of the bed for a few minutes before getting out of bed. Consider elevating the bed at the head end to avoid drop of blood pressure when getting up. Walk always in a well-lit room (use night lights in the walls). Avoid area rugs or power cords from appliances in the middle of the walkways. Use a walker or a cane if necessary and consider physical therapy for balance exercise. Get your eyesight checked regularly.  FINANCIAL OVERSIGHT: Supervision, especially oversight when making financial decisions or transactions is also recommended.  HOME SAFETY: Consider the safety of the kitchen when operating appliances like stoves, microwave oven, and blender. Consider having supervision and share cooking responsibilities until no longer able to participate in those. Accidents with firearms and other hazards in the house should be identified and addressed as well.   ABILITY TO BE LEFT ALONE: If patient is unable to contact 911 operator, consider using LifeLine, or when the need is there, arrange for someone to stay with patients. Smoking is a fire hazard, consider supervision or cessation. Risk of wandering should be assessed by caregiver and if detected at any point, supervision and safe proof recommendations should be instituted.  MEDICATION SUPERVISION: Inability to self-administer medication needs to be constantly addressed. Implement a mechanism to ensure safe administration of the medications.   DRIVING: Regarding driving, in patients with progressive memory problems, driving will be impaired. We advise to have someone else do the driving if  trouble finding directions or if minor accidents are reported. Independent driving assessment is available to determine safety of driving.   If you are interested in the driving assessment, you can contact the following:  [employer] in [place] [phone]  [facility] [phone]  [facility] [phone] or [phone]      Mediterranean Diet A Mediterranean diet refers to food and lifestyle choices that are based on the traditions of countries located on the [employer]. This way of eating has been shown to help prevent certain conditions and improve outcomes for people who have chronic diseases, like kidney disease and heart disease. What are tips for following this plan? Lifestyle  Cook and eat meals together with your family, when possible. Drink enough fluid to keep your urine clear or pale yellow. Be physically active every day. This includes: Aerobic exercise like running or swimming. Leisure activities like gardening, walking, or housework. Get 7-8 hours of sleep each night. If recommended by your health care provider, drink red wine in moderation. This means 1 glass a day for nonpregnant women and 2 glasses a day for men. A glass of wine equals 5 oz (150 mL). Reading food labels  Check the serving size of packaged foods. For foods such as rice and pasta, the serving size refers to the amount of cooked product, not dry. Check the total fat in packaged foods. Avoid foods that have saturated fat or trans fats. Check the ingredients  list for added sugars, such as corn syrup. Shopping  At the grocery store, buy most of your food from the areas near the walls of the store. This includes: Fresh fruits and vegetables (produce). Grains, beans, nuts, and seeds. Some of these may be available in unpackaged forms or large amounts (in bulk). Fresh seafood. Poultry and eggs. Low-fat dairy  products. Buy whole ingredients instead of prepackaged foods. Buy fresh fruits and vegetables in-season from local farmers markets. Buy frozen fruits and vegetables in resealable bags. If you do not have access to quality fresh seafood, buy precooked frozen shrimp or canned fish, such as tuna, salmon, or sardines. Buy small amounts of raw or cooked vegetables, salads, or olives from the deli or salad bar at your store. Stock your pantry so you always have certain foods on hand, such as olive oil, canned tuna, canned tomatoes, rice, pasta, and beans. Cooking  Cook foods with extra-virgin olive oil instead of using butter or other vegetable oils. Have meat as a side dish, and have vegetables or grains as your main dish. This means having meat in small portions or adding small amounts of meat to foods like pasta or stew. Use beans or vegetables instead of meat in common dishes like chili or lasagna. Experiment with different cooking methods. Try roasting or broiling vegetables instead of steaming or sauteing them. Add frozen vegetables to soups, stews, pasta, or rice. Add nuts or seeds for added healthy fat at each meal. You can add these to yogurt, salads, or vegetable dishes. Marinate fish or vegetables using olive oil, lemon juice, garlic, and fresh herbs. Meal planning  Plan to eat 1 vegetarian meal one day each week. Try to work up to 2 vegetarian meals, if possible. Eat seafood 2 or more times a week. Have healthy snacks readily available, such as: Vegetable sticks with hummus. Greek yogurt. Fruit and nut trail mix. Eat balanced meals throughout the week. This includes: Fruit: 2-3 servings a day Vegetables: 4-5 servings a day Low-fat dairy: 2 servings a day Fish, poultry, or lean meat: 1 serving a day Beans and legumes: 2 or more servings a week Nuts and seeds: 1-2 servings a day Whole grains: 6-8 servings a day Extra-virgin olive oil: 3-4 servings a day Limit red meat and sweets  to only a few servings a month What are my food choices? Mediterranean diet Recommended Grains: Whole-grain pasta. Brown rice. Bulgar wheat. Polenta. Couscous. Whole-wheat bread. [place]. Vegetables: Artichokes. Beets. Broccoli. Cabbage. Carrots. Eggplant. Green beans. Chard. Kale. Spinach. Onions. Leeks. Peas. Squash. Tomatoes. Peppers. Radishes. Fruits: Apples. Apricots. Avocado. Berries. Bananas. Cherries. Dates. Figs. Grapes. Lemons. Melon. Oranges. Peaches. Plums. Pomegranate. Meats and other protein foods: Beans. Almonds. Sunflower seeds. Pine nuts. Peanuts. Cod. Salmon. Scallops. Shrimp. Tuna. Tilapia. Clams. Oysters. Eggs. Dairy: Low-fat milk. Cheese. Greek yogurt. Beverages: Water. Red wine. Herbal tea. Fats and oils: Extra virgin olive oil. Avocado oil. Grape seed oil. Sweets and desserts: [place] yogurt with honey. Baked apples. Poached pears. Trail mix. Seasoning and other foods: Basil. Cilantro. Coriander. Cumin. Mint. Parsley. Sage. Rosemary. Tarragon. Garlic. Oregano. Thyme. Pepper. Balsalmic vinegar. Tahini. Hummus. Tomato sauce. Olives. Mushrooms. Limit these Grains: Prepackaged pasta or rice dishes. Prepackaged cereal with added sugar. Vegetables: Deep fried potatoes (french fries). Fruits: Fruit canned in syrup. Meats and other protein foods: Beef. Pork. Lamb. Poultry with skin. Hot dogs. [place]. Dairy: Ice cream. Sour cream. Whole milk. Beverages: Juice. Sugar-sweetened soft drinks. Beer. Liquor and spirits. Fats and oils: Butter. Canola oil.  Vegetable oil. Beef fat (tallow). Lard. Sweets and desserts: Cookies. Cakes. Pies. Candy. Seasoning and other foods: Mayonnaise. Premade sauces and marinades. The items listed may not be a complete list. Talk with your dietitian about what dietary choices are right for you. Summary The Mediterranean diet includes both food and lifestyle choices. Eat a variety of fresh fruits and vegetables, beans, nuts, seeds, and whole  grains. Limit the amount of red meat and sweets that you eat. Talk with your health care provider about whether it is safe for you to drink red wine in moderation. This means 1 glass a day for nonpregnant women and 2 glasses a day for men. A glass of wine equals 5 oz (150 mL). This information is not intended to replace advice given to you by your health care provider. Make sure you discuss any questions you have with your health care provider. Document Released: 09/15/2015 Document Revised: 10/18/2015 Document Reviewed: 09/15/2015 Elsevier Interactive Patient Education  2017 [employer].

## 2023-11-11 ENCOUNTER — Ambulatory Visit: Admitting: Rehabilitation

## 2023-11-11 ENCOUNTER — Encounter: Payer: Self-pay | Admitting: Rehabilitation

## 2023-11-11 DIAGNOSIS — M6281 Muscle weakness (generalized): Secondary | ICD-10-CM

## 2023-11-11 DIAGNOSIS — M5459 Other low back pain: Secondary | ICD-10-CM

## 2023-11-11 DIAGNOSIS — H8113 Benign paroxysmal vertigo, bilateral: Secondary | ICD-10-CM | POA: Diagnosis not present

## 2023-11-11 NOTE — Therapy (Signed)
 OUTPATIENT PHYSICAL THERAPY VESTIBULAR EVALUATION/ THORACOLUMBAR TREATMENT   Patient Name: Shadeed Colberg MRN: [id number] DOB:20-Aug-1934, 89 y.o., male Today's Date: 11/13/2023  END OF SESSION:        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   Generalized anxiety disorder 10/30/2021   Hemorrhoids    Hepatic cyst 11/19/2007   High serum vitamin D  01/12/2020   History of blood clots    History of colon polyps 11/02/2011   hyperplastic   Hyperlipidemia    Impacted cerumen 07/29/2009   Major depressive disorder 10/30/2021   Osteoarthritis    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   Otitis externa 07/07/2021   Overweight 10/05/2010   Pain of left thumb 02/20/2016   Pulmonary embolism (HCC) 11/20/2009   Right groin pain 11/16/2021   Thrombocytopenia 12/01/2009   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   Past Surgical History:  Procedure Laterality Date   APPENDECTOMY     CATARACT EXTRACTION  02/09/2007   right eye   INGUINAL HERNIA REPAIR     INGUINAL HERNIA REPAIR Right 01/05/2022   Procedure: RIGHT HERNIA REPAIR RECURRENT INGUINAL WITH THREE BY SIX POLYPROPYLENE MESH;  Surgeon: Sebastian Moles, MD;  Location: [facility] OR;  Service: General;  Laterality: Right;  GEN AND TAP BLOCK   TONSILLECTOMY     VARICOSE VEIN SURGERY     VASECTOMY     Patient Active Problem List   Diagnosis Date Noted   Gait disorder 10/14/2023   Dizziness 10/14/2023   Paresthesia 10/14/2023   Family history  of early CAD 04/26/2023   Essential hypertension 04/26/2023   Major depressive disorder 10/30/2021   Generalized anxiety disorder 10/30/2021   Ataxia of right upper extremity 08/09/2021   Dementia due to Alzheimer's disease 03/18/2020   High serum vitamin D  01/12/2020   Pain of left thumb 02/20/2016   History of colon polyps 11/02/2011   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   Overweight 10/05/2010   Backache 04/21/2010   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  Recent months per patient  NEXT MD VISIT:    SUBJECTIVE:  SUBJECTIVE STATEMENT:  Patient states feels pretty good...   States his R low back is still bothering him, but not as bad.  None of the sharp pain has recoccurred per his report.     PAIN: Are you having pain? Yes: NPRS scale: wong-baker faces;  0/10 now; 7/10 worst over the last week Pain location: low back and R hip/posterior thigh Pain description: aching Aggravating factors: can't give any Relieving factors: meds  PERTINENT HISTORY:  dementia, blood clotting disorder (Factor V), h/o DVT and PE, ataxia, LBP, moderate aortic sclerosis  PRECAUTIONS: Fall  RED FLAGS: None  WEIGHT  BEARING RESTRICTIONS: No  FALLS:  Has patient fallen in last 6 months? 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   RADIATION DOSE REDUCTION: This exam was performed according to the departmental dose-optimization program which includes automated exposure control, adjustment of the mA and/or kV according to patient size and/or use of iterative reconstruction technique.   COMPARISON:  August 10, 2023   FINDINGS: Brain: There is generalized cerebral atrophy with widening of the extra-axial spaces and ventricular dilatation. There are areas of decreased attenuation within the white matter tracts of the supratentorial brain, consistent with microvascular disease changes.   Vascular: Marked severity bilateral cavernous carotid artery calcification is noted.   Skull: Normal. Negative for fracture or focal lesion.   Sinuses/Orbits: No acute finding.   Other: None.   IMPRESSION: 1. Generalized cerebral atrophy with widening of the extra-axial spaces and ventricular dilatation. 2. No acute intracranial abnormality.     Electronically Signed   By: Suzen Dials M.D.   On: 10/16/2023 13:10  PATIENT SURVEYS:  Patient can't reliably answer any surverys  SCREENING FOR RED FLAGS: Bowel or bladder incontinence: No Spinal  tumors: No Cauda equina syndrome: No Compression fracture: No Abdominal aneurysm: No  COGNITION:  Overall cognitive status: Impaired    SENSATION: WFL  POSTURE:  rounded shoulders, forward head, and decreased lumbar lordosis  PALPATION: Unable to palpate any areas of pain today with deep pressure to his low back, pelvis, hips, and with vertebral P/A glides from T7 to L5  LUMBAR ROM:   Active  Eval  Flexion To knees; p!  Extension 30%  Right lateral flexion To mid thigh  Left lateral flexion To mid thigh  Right rotation 50%  Left rotation 50%  (Blank rows = not tested)  MUSCLE LENGTH: Hamstrings: Right SLR 40 deg; Left SLR 50 deg Thomas test: Right 10 deg; Left NT  Hamstrings: 90/90 is 45 deg RLE;  50 deg LLE ITB: NT Piriformis: moderately stiff R, mildly stiff LLE Hip flexors: minimal tightness Quads: NT Heelcord: NT  LOWER EXTREMITY ROM:     Passive  Right eval Left eval  Hip flexion    Hip extension    Hip abduction    Hip adduction    Hip internal rotation 18 24  Hip  external rotation 33 39  Knee flexion    Knee extension    Ankle dorsiflexion    Ankle plantarflexion    Ankle inversion    Ankle eversion    (Blank rows = not tested)  LOWER EXTREMITY MMT:    MMT Right eval Left eval RLE 10/16/23 LLE 10/16/23  Hip flexion 4 4 4+ 4  Hip extension 3+ 3+ 4- 4-  Hip abduction 4- 4- 4- 4-  Hip adduction      Hip internal rotation 4- 4 4 4   Hip external rotation 4 4+ 4 4+  Knee flexion 4 4 4+ 4+  Knee extension 5 5 5 5   Ankle dorsiflexion 4 4 4 4   Ankle plantarflexion      Ankle inversion      Ankle eversion       (Blank rows = not tested)  LUMBAR SPECIAL TESTS:  Straight leg raise test: Negative, Slump test: Negative, SI Compression/distraction test: Negative, and FABER test: Negative  FUNCTIONAL TESTS:  10/16/23:  BERG = 40/56  Gait speed = 1.2 ft/sec  5X STS = 17.51 sec  TUG = 14.38 sec  GAIT: Distance walked: into clinic from parking  lot Assistive device utilized: None Level of assistance: CGA Gait pattern: decreased arm swing- Right, decreased arm swing- Left, decreased step length- Right, decreased step length- Left, decreased stance time- Right, decreased stance time- Left, decreased stride length, decreased ankle dorsiflexion- Right, and decreased ankle dorsiflexion- Left Comments: He ambulates with no device with unsteadiness, short shuffling gait pattern with no trunk or arm movement and very flat back, rounded shoulders   VESTIBULAR ASSESSMENT:  PATIENT SURVEYS:  DHI = 16/100  GENERAL OBSERVATION: forward head, minimal neck movement with gait or at any time   SYMPTOM BEHAVIOR:  Subjective history: patient has dementia with memory deficits so this is difficult.  Caregiver states he gets dizzy when he gets up from a seated position too quickly.    However, she states when he last had the issue that when he sat down he felt like the room was spinning for about 30 minutes.   However, he has had no symptoms in the last 1 week.   Non-Vestibular symptoms: denies any neck pain, HA, etc  Type of dizziness: Spinning/Vertigo  Frequency: spouse/caregiver reports multiple times daily when it was occurring, but it's not now  Duration: up to 1 hour  Aggravating factors: Induced by position change: lying supine, supine to sit, and sit to stand  Relieving factors: no known relieving factors  Progression of symptoms: better--resolved per spouse  OCULOMOTOR EXAM:  Ocular Alignment: normal  Ocular ROM: No Limitations  Spontaneous Nystagmus: absent  Gaze-Induced Nystagmus: absent  Smooth Pursuits: saccades  Saccades: hypometric/undershoots  Convergence/Divergence: appears normal   VESTIBULAR - OCULAR REFLEX:   Slow VOR: Normal  VOR Cancellation: Corrective Saccades  Head-Impulse Test: HIT Right: NT  Dynamic Visual Acuity: NT   POSITIONAL TESTING: Right Dix-Hallpike: no nystagmus Left Dix-Hallpike: no  nystagmus  MOTION SENSITIVITY:  Motion Sensitivity Quotient Intensity: 0 = none, 1 = Lightheaded, 2 = Mild, 3 = Moderate, 4 = Severe, 5 = Vomiting  Intensity  1. Sitting to supine 0  2. Supine to L side 0  3. Supine to R side 0  4. Supine to sitting 0  5. L Hallpike-Dix 0  6. Up from L  0  7. R Hallpike-Dix 0  8. Up from R  0  9. Sitting, head tipped to L knee 0  10. Head up from L knee 0  11. Sitting, head tipped to R knee 0  12. Head up from R knee 0  13. Sitting head turns x5 0  14.Sitting head nods x5 0  15. In stance, 180 turn to L  0  16. In stance, 180 turn to R 0    OTHOSTATICS: not done;  However we did orthostatics on a recent visit when he was much more symptomatic and they were normal  ASSESSMENT:  CLINICAL IMPRESSION: See assessment in the below sections  PLAN: We will plan to continue with PT at the regularly scheduled frequency without any changes to treatment plan or goals.   Patient is no longer symptomatic.  We will give him some gaze stabilization exercises and see how he does.  Hopefully symptoms won't return   TODAY'S TREATMENT:  11/11/23 THERAPEUTIC EXERCISE: To improve strength.  Demonstration, verbal and tactile cues throughout for technique. NuStep L6 x 8'  PHYSICAL PERFORMANCE TEST or MEASUREMENT:     THERAPEUTIC ACTIVITIES: To improve functional performance.  Demonstration, verbal and tactile cues throughout for technique. Seated hamstring stretch x 1' x 2 BLE Seated pigeon stretch x 1' x 2 BLE Standing hip flexor stretch at counter x 1' x 2 BLE Standing back ext at counter x 10 w/ 10 sec holds Standing heel rocking back to counter x 20 BLE Standing calf raise back to counter x 20 BLE Sidestepping blue TB x 5 laps at counter Tandem gait at counter x 5 laps F/B   11/07/23 THERAPEUTIC EXERCISE: To improve strength.  Demonstration, verbal and tactile cues throughout for technique. Bike L0 x 8'  NEUROMUSCULAR RE-EDUCATION: To improve  balance, posture, proprioception, and reduce fall risk. Tandem gait at counter F/B  Back to counter toe raises x 2/10 BLE Back to counter heel raises x 2/10 BLE Counter back extension x 10  Standing hip ext stretch at counter x 1' x 2 BLE Seated leg on table ham stretch x 1' x 3 Seated pigeon stretch 1' x 3 RLE Standing toe prop on 1/2 foam roll calf stretch x 1' x 2 Seated rowing 15# close grip x 20;  Wide grip x 20 BUE Standing donkey kick w/ chest press machine bar 5# x 3/10 RLE  11/04/23 THERAPEUTIC ACTIVITIES: To improve functional performance.  Demonstration, verbal and tactile cues throughout for technique. Supine SLR PT assisted hamstring stretch x 1' x 3 BLE Supine cross over piriformis stretch x 1' x 3 RLE Supine FABER piriformis stretch x 1' x 3 RLE Supine modified Thomas hip flexor stretch x 1' x 3 RLE Supine butterfly groin stretch x 1' BLE SL hip abd x 20 RLE SL clams x 20 RLE Prone hip ext x 20 RLE  MANUAL THERAPY: To promote improved flexibility and reduced pain utilizing joint mobilization, manual TP therapy, and myofascial release. Deep Tp massage to R quadratus and paraspinals;  MFR to the same area in SL and prone Prone Grade 3-4 P/A glides L2-L5 x 20 each level  10/29/23 THERAPEUTIC EXERCISE: To improve strength.  Demonstration, verbal and tactile cues throughout for technique. NuStep L6 x 8' Supine SLR PT assisted hamstring stretch x 1' x 3 BLE Supine cross over piriformis stretch x 1' x 3 RLE Supine FABER piriformis stretch x 1' x 3 RLE Supine modified Thomas hip flexor stretch x 1' x 3 RLE Supine butterfly groin stretch x 1' BLE SL hip abd x 20 RLE SL clams x 20 RLE Prone hip ext  x 20 RLE  NEUROMUSCULAR RE-EDUCATION: To improve balance, posture, and proprioception. Small ball bridge x 20BLE Small ball rollouts x 20 BLE Small ball S/S X 20 BLE  MANUAL THERAPY: To promote improved flexibility and reduced pain utilizing joint mobilization, manual TP  therapy, and myofascial release. Deep Tp massage to R quadratus and paraspinals;  MFR to the same area in SL and prone Prone Grade 3-4 P/A glides L2-L5 x 20 each level  10/24/23 THERAPEUTIC EXERCISE: To improve ROM.  Demonstration, verbal and tactile cues throughout for technique. NuStep L5 x 7' Supine manual hamstring stretching x 1' x 3 RLE Seated hamstring stretching x 1' x 2 RLE FABER piriformis stretching x 1' x 2 RLE Cross over piriformis stretching x 1' x 2 RLE  10/16/23 THERAPEUTIC EXERCISE: To improve ROM.  Demonstration, verbal and tactile cues throughout for technique. NuStep L4 x 7'  SKTC flexion stretch x 10 DKTC flexion stretchx 10 Supine manual hamstretch x 1' x 3 RLE Supine finger slides to knees curl up x 2/10 Bridge with knee extension x 10 BLE Seated FABER piriformis stretch x 1' x 3 RLE Seated cross over piriformis stretch x 1' x # RLE  PHYSICAL PERFORMANCE TEST or MEASUREMENT:    10/14/23 THERAPEUTIC ACTIVITIES: To improve functional performance.  Demonstration, verbal and tactile cues throughout for technique. Supine manual PT assist hamstring stretch x 1' x 3 BLE Hooklying fingers to knees crunch x 3/5 Ball S/S X 10 BLE Peanut ball rollouts x 2/10 BLE Lumbar arch and slump x 10 Bridging x 3/10 BLE Supine LTR x 20 BLE Standing back extension x 15 Seated ham stretch x 1' x 3 BLE Seated hip flexor stretch x 1' x 3 BLE  PATIENT EDUCATION:  Education details: PT eval findings, anticipated POC, and initial HEP  Person educated: Patient Education method: Explanation, Demonstration, Verbal cues, Tactile cues, and Handouts Education comprehension: verbalized understanding, verbal cues required, tactile cues required, and needs further education  HOME EXERCISE PROGRAM: Access Code: [email or web address] URL: [email or web address] Date: 10/03/2023 Prepared by: Garnette Montclair  Exercises - Seated Hamstring Stretch  - 1 x daily - 7 x weekly - 1 sets - 1  reps - 1 min hold - Standing Lumbar Extension with Counter  - 1 x daily - 7 x weekly - 1 sets - 10 reps - Supine Lower Trunk Rotation  - 1 x daily - 7 x weekly - 1 sets - 10 reps - Seated Figure 4 Piriformis Stretch  - 1 x daily - 7 x weekly - 1 sets - 1 reps - 1 min hold - Seated Piriformis Stretch  - 1 x daily - 7 x weekly - 1 sets - 1 reps - 1 min hold - Supine Bridge  - 1 x daily - 7 x weekly - 3 sets - 10 reps   ASSESSMENT:  CLINICAL IMPRESSION: Patient continues to improve with his back pain and is doing ok today.   States AM hours are worst, but after Tylenol  he is better.   He has had no falls and denies any dizziness/vertigo symptoms.  Spouse corroborates what the patient is reporting and denies any episodes of vertigo/dizziness.   She agrees that his back is doing better, but still bothers him a lot in the morning.  She states he was not any worse after our last treatment.  Will continue along current treatment lines with gentle lumbar stretching and strengthening for a few more visits.   He and spouse are agreeable to  this plan.     REEVAL for vertigo:  Patient has a new order for PT for BPPV.   He was having a lot of dizziness with symptoms of room spinning last 2-3 weeks.  So much so that they went to [facility] and had a head CT done.   The head CT was negative and his spouse/caregiver reports that his symptoms have resolved now.   She doesn't even feel his vertigo/dizziness is an issue now.   The patient is taken thru [facility] dix testing and there are no symptoms of dizziness or nystagmus noted.   He does have some evidence of a vestibular hypofunction since there is some nystagmus present on today's testing.   However, if he is not symptomatic I don't think he needs a lot of therapy focused on this.  His dementia already prevents him from being compliant with his home exercises and his spouse/caregiver confirms this.   We will continue PT for his regularly scheduled frequency for his back pain  and balance deficits, but can add in vestibular treatments PRN.  Patient/caregiver are agreeable to this plan    RECERTIFICATION:  Patient has been seen x 2 months PT initially for unsteady gait and falls and more recently for LBP with R sided sciatica.  Initially his balance made very good improvement so we were able to focus more on his LBP.   He continues to c/o increased LBP in the AM hours that improves as the day goes on.   Spouse reports he gets up in the morning in a lot of back and RLE pain, though.   He has severely tight hamstrings and I doubt he is remembering to stretch them at home due to his dementia which is a barrier to progress.   We are continuing to progress his lumbar exercises as able.   Spouse also reports that over the last week the patient has been significantly more unsteady for no apparent reason.  His 5X sit to stand score has improved from 18.29 to 17.51 sec, and TUG score has improved from 15.25 to 14.38 sec.   His Berg Score today is 40/56 indicating that he still needs to be using a walker.  He is very unsteady in the clinic today in comparison to the last 3 weeks.    He is going to have a brain MRI after leaving therapy today so we will know more once the results are in.   We will continue PT to address his LBP, but also his unsteady gait, dizziness, balance deficits.  He is agreeable to continue PT per POC.   OBJECTIVE IMPAIRMENTS: Abnormal gait, decreased balance, difficulty walking, decreased ROM, decreased strength, decreased safety awareness, impaired perceived functional ability, impaired flexibility, improper body mechanics, postural dysfunction, and pain.   ACTIVITY LIMITATIONS: carrying, lifting, bending, stairs, and locomotion level  PARTICIPATION LIMITATIONS: community activity  PERSONAL FACTORS: Age and 1-2 comorbidities:   dementia, blood clotting disorder (Factor V), h/o DVT and PE, ataxia, LBP, moderate aortic sclerosis are also affecting patient's  functional outcome.   REHAB POTENTIAL: Good  CLINICAL DECISION MAKING: Evolving/moderate complexity  EVALUATION COMPLEXITY: Moderate   GOALS: Goals reviewed with patient? Yes  SHORT TERM GOALS: Target date: 11/16/2023   Patient will be independent with initial HEP to improve outcomes and carryover.  Baseline: 100% PT assist required for correct completion 9/8:  Needs assist  10/29/23:  patient is able to return demo his basic stretches today Goal status: MET  2.  Patient  will report 25% improvement in low back pain to improve QOL. Baseline: variable pain day to day with 7/10 worst 10/14/23:  tells me worst pain over weekend was 5/10 10/29/23:  states 0/10 today; 3/10 this am Goal status: MET  LONG TERM GOALS: Target date: 12/14/2023   Patient will be independent with ongoing/advanced HEP for self-management at home.  Baseline: no advanced lumbar HEP yet 10/14/23:  progressing with more advanced exercises Goal status: IN PROGRESS  2.  Patient will rep/ort 50-75% improvement in low back pain to improve QOL. / Baseline: 7/10 worst:  10/29/23:  2-3/10 worst  Goal status: IN PROGRESS  3.  Patient to demonstrate ability to achieve and maintain good spinal alignment/posturing and body mechanics needed for daily activities. Baseline: flat back, flexed hips in standing with forward head Goal status: INITIAL  4.  Patient will demonstrate increased lumbar ROM  by 10-20% in all planes to perform ADLs.   Baseline: Refer to above lumbar ROM table Goal status: INITIAL  5.  Patient will demonstrate improved BLE strength to >/= 4+/5 for improved stability and ease of mobility. Baseline: Refer to above LE MMT table 10/29/23:  prone hip extension today = 4- RLE Goal status: IN PROGRESS  7.  Patient will tolerate 20-30 min of (standing/sitting/walking) w/o increased pain to allow for  improved mobility and activity tolerance and be able to go shopping with his spouse Baseline: able to access  community with spouse supervision/assistance but in pain Goal status: INITIAL  8.  Patient will improve Berg balance score to >/= 48/56 to allow for gait with a cane rather than needing a walker for community access  Baseline: 10/16/23:  40/56 indicating patient needs a walker all times  11/11/23:  46/56  Goal status:  IN PROGRESS  9.  Patient will improve 5xSTS time to </= 12 seconds for improved efficiency and safety with transfers. Baseline: 10/16/23:  17.51 sec 11/11/23:  13.26 sec Goal status: IN PROGRESS    10.  Patient will demonstrate gait speed of >/= 1.8 ft/sec (0.55 m/s) to be a safe limited community ambulator with decreased risk for recurrent falls.  Baseline: 10/16/23:  tug = 14.38 sec 11/11/23:  2.77 FT/SEC gait speed   TUG = 12.53 sec Goal status: IN PROGRESS  PLAN:  PT FREQUENCY: 1-2x/week  PT DURATION: 6 weeks  PLANNED INTERVENTIONS: 97164- PT Re-evaluation, 97750- Physical Performance Testing, 97110-Therapeutic exercises, 97530- Therapeutic activity, 97112- Neuromuscular re-education, 97535- Self Care, [id number]- Manual therapy, [id number]- Gait training, [id number]- Electrical stimulation (unattended), 97016- Vasopneumatic device, [id number]- Ultrasound, [id number]- Traction (mechanical), [id number]- Ionotophoresis 4mg /ml Dexamethasone , Taping, Joint mobilization, Spinal mobilization, Cryotherapy, and Moist heat  PLAN FOR NEXT SESSION:  Recheck gait speed,5X STS, BERG, strength.   Begin preparing for D/C from PT   Bartlomiej Jenkinson, PT 11/13/2023, 9:50 PM

## 2023-11-14 ENCOUNTER — Ambulatory Visit: Admitting: Rehabilitation

## 2023-11-14 ENCOUNTER — Other Ambulatory Visit: Payer: Self-pay

## 2023-11-14 ENCOUNTER — Encounter: Payer: Self-pay | Admitting: Rehabilitation

## 2023-11-14 DIAGNOSIS — M6281 Muscle weakness (generalized): Secondary | ICD-10-CM

## 2023-11-14 DIAGNOSIS — M5459 Other low back pain: Secondary | ICD-10-CM

## 2023-11-14 DIAGNOSIS — H8113 Benign paroxysmal vertigo, bilateral: Secondary | ICD-10-CM | POA: Diagnosis not present

## 2023-11-14 DIAGNOSIS — R2681 Unsteadiness on feet: Secondary | ICD-10-CM

## 2023-11-14 DIAGNOSIS — R269 Unspecified abnormalities of gait and mobility: Secondary | ICD-10-CM

## 2023-11-14 NOTE — Therapy (Signed)
 OUTPATIENT PHYSICAL THERAPY BALANCE / THORACOLUMBAR TREATMENT  Progress Note Reporting Period 10/03/23 to 11/14/2023   See note below for Objective Data and Assessment of Progress/Goals.      Patient Name: Shawn [name] MRN: [id number] DOB:[birth year], 89 y.o., male Today's Date: 11/14/2023  END OF SESSION:  PT End of Session - 11/14/23 1148     Visit Number 20    Date for Recertification  10/17/23    Authorization Type [employer]etna medicare    PT Start Time 1144    PT Stop Time 1230    PT Time Calculation (min) 46 min    Activity Tolerance Patient tolerated treatment well;No increased pain    Behavior During Therapy [facility] for tasks assessed/performed               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   Generalized anxiety disorder 10/30/2021   Hemorrhoids    Hepatic cyst 11/19/2007   High serum vitamin D  01/12/2020   History of blood clots    History of colon polyps 11/02/2011   hyperplastic   Hyperlipidemia    Impacted cerumen 07/29/2009   Major depressive disorder 10/30/2021   Osteoarthritis    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   Otitis externa 07/07/2021   Overweight 10/05/2010   Pain of left thumb 02/20/2016   Pulmonary embolism (HCC) 11/20/2009   Right groin pain 11/16/2021   Thrombocytopenia 12/01/2009   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   Past Surgical History:  Procedure Laterality Date   APPENDECTOMY     CATARACT EXTRACTION  02/09/2007   right  eye   INGUINAL HERNIA REPAIR     INGUINAL HERNIA REPAIR Right 01/05/2022   Procedure: RIGHT HERNIA REPAIR RECURRENT INGUINAL WITH THREE BY SIX POLYPROPYLENE MESH;  Surgeon: Sebastian Moles, MD;  Location: [facility] OR;  Service: General;  Laterality: Right;  GEN AND TAP BLOCK   TONSILLECTOMY     VARICOSE VEIN SURGERY     VASECTOMY     Patient Active Problem List   Diagnosis Date Noted   Gait disorder 10/14/2023   Dizziness 10/14/2023   Paresthesia 10/14/2023   Family history of early CAD 04/26/2023   Essential hypertension 04/26/2023   Major depressive disorder 10/30/2021   Generalized anxiety disorder 10/30/2021   Ataxia of right upper extremity 08/09/2021   Dementia due to Alzheimer's disease 03/18/2020   High serum vitamin D  01/12/2020   Pain of left thumb 02/20/2016   History of colon polyps 11/02/2011   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   Overweight 10/05/2010   Backache 04/21/2010   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   Thrombocytopenia 12/01/2009   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   Aortic valve disorder 11/19/2007   Benign neoplasm of skin 08/11/2007   Hyperlipidemia 09/04/2006   Osteoarthritis 09/04/2006   Dysphagia 09/04/2006    PCP: Antonio Cyndee Jamee JONELLE, DO   REFERRING PROVIDER: Daryl Setter, NP   REFERRING DIAG: G89.29,M54.41 (ICD-10-CM) - Chronic  bilateral low back pain with right-sided sciatica   THERAPY DIAG:  Other low back pain  Muscle weakness (generalized)  Gait abnormality  Unsteadiness on feet  RATIONALE FOR EVALUATION AND TREATMENT: Rehabilitation  ONSET DATE: ?  Recent months per patient  NEXT MD VISIT:    SUBJECTIVE:                                                                                                                                                                                                         SUBJECTIVE STATEMENT:  Patient states feels good today.  Still having some back  pain, but mainly in the AM and tylenol  is taking care of it.   States feels like he is falling forward at times, but not always.   Denies any falls.  PAIN: Are you having pain? Yes: NPRS scale: wong-baker faces;  0/10 now; 7/10 worst over the last week Pain location: low back and R hip/posterior thigh Pain description: aching Aggravating factors: can't give any Relieving factors: meds  PERTINENT HISTORY:  dementia, blood clotting disorder (Factor V), h/o DVT and PE, ataxia, LBP, moderate aortic sclerosis  PRECAUTIONS: Fall  RED FLAGS: None  WEIGHT BEARING RESTRICTIONS: No  FALLS:  Has patient fallen in last 6 months? 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   RADIATION DOSE REDUCTION: This exam was performed according to the departmental dose-optimization program which includes automated exposure control, adjustment of the mA and/or kV according to patient size and/or use of iterative reconstruction technique.   COMPARISON:  August 10, 2023   FINDINGS: Brain: There is generalized cerebral atrophy with widening of the extra-axial spaces and ventricular dilatation. There are areas of decreased attenuation within the white matter tracts of the supratentorial brain, consistent with microvascular disease changes.   Vascular:  Marked severity bilateral cavernous carotid artery calcification is noted.   Skull: Normal. Negative for fracture or focal lesion.   Sinuses/Orbits: No acute finding.  Other: None.   IMPRESSION: 1. Generalized cerebral atrophy with widening of the extra-axial spaces and ventricular dilatation. 2. No acute intracranial abnormality.     Electronically Signed   By: Suzen Dials M.D.   On: 10/16/2023 13:10  PATIENT SURVEYS:  Patient can't reliably answer any surverys  SCREENING FOR RED FLAGS: Bowel or bladder incontinence: No Spinal tumors: No Cauda equina syndrome: No Compression fracture: No Abdominal aneurysm: No  COGNITION:  Overall cognitive status: Impaired    SENSATION: WFL  POSTURE:  rounded shoulders, forward head, and decreased lumbar lordosis  PALPATION: Unable to palpate any areas of pain today with deep pressure to his low back, pelvis, hips, and with vertebral P/A glides from T7 to L5  LUMBAR ROM:   Active  Eval 11/14/23  Flexion To knees; p! midshins  Extension 30% 40%  Right lateral flexion To mid thigh To knee joint  Left lateral flexion To mid thigh To knee joint  Right rotation 50% 60%  Left rotation 50% 60%  (Blank rows = not tested)  MUSCLE LENGTH: Hamstrings: Right SLR 40 deg; Left SLR 50 deg Thomas test: Right 10 deg; Left NT  Hamstrings: 90/90 is 45 deg RLE;  50 deg LLE ITB: NT Piriformis: moderately stiff R, mildly stiff LLE Hip flexors: minimal tightness Quads: NT Heelcord: NT  LOWER EXTREMITY ROM:     Passive  Right eval Left eval  Hip flexion    Hip extension    Hip abduction    Hip adduction    Hip internal rotation 18 24  Hip external rotation 33 39  Knee flexion    Knee extension    Ankle dorsiflexion    Ankle plantarflexion    Ankle inversion    Ankle eversion    (Blank rows = not tested)  LOWER EXTREMITY MMT:    MMT Right eval Left eval RLE 10/16/23 LLE 10/16/23 RLE  11/14/23 LLE 11/14/23  Hip flexion  4 4 4+ 4 4+ 4+  Hip extension 3+ 3+ 4- 4- 4 4  Hip abduction 4- 4- 4- 4- 4 4  Hip adduction        Hip internal rotation 4- 4 4 4 4  4+  Hip external rotation 4 4+ 4 4+ 5 5  Knee flexion 4 4 4+ 4+ 5 5  Knee extension 5 5 5 5 5 5   Ankle dorsiflexion 4 4 4 4 4  4+  Ankle plantarflexion        Ankle inversion        Ankle eversion         (Blank rows = not tested)  LUMBAR SPECIAL TESTS:  Straight leg raise test: Negative, Slump test: Negative, SI Compression/distraction test: Negative, and FABER test: Negative  FUNCTIONAL TESTS:  10/16/23:  BERG = 40/56  Gait speed = 1.2 ft/sec  5X STS = 17.51 sec  TUG = 14.38 sec  GAIT: Distance walked: into clinic from parking lot Assistive device utilized: None Level of assistance: CGA Gait pattern: decreased arm swing- Right, decreased arm swing- Left, decreased step length- Right, decreased step length- Left, decreased stance time- Right, decreased stance time- Left, decreased stride length, decreased ankle dorsiflexion- Right, and decreased ankle dorsiflexion- Left Comments: He ambulates with no device with unsteadiness, short shuffling gait pattern with no trunk or arm movement and very flat back, rounded shoulders   VESTIBULAR ASSESSMENT:  PATIENT SURVEYS:  DHI = 16/100  GENERAL OBSERVATION: forward head, minimal neck movement with gait or at any time  SYMPTOM BEHAVIOR:  Subjective history: patient has dementia with memory deficits so this is difficult.  Caregiver states he gets dizzy when he gets up from a seated position too quickly.    However, she states when he last had the issue that when he sat down he felt like the room was spinning for about 30 minutes.   However, he has had no symptoms in the last 1 week.   Non-Vestibular symptoms: denies any neck pain, HA, etc  Type of dizziness: Spinning/Vertigo  Frequency: spouse/caregiver reports multiple times daily when it was occurring, but it's not now  Duration: up to 1  hour  Aggravating factors: Induced by position change: lying supine, supine to sit, and sit to stand  Relieving factors: no known relieving factors  Progression of symptoms: better--resolved per spouse  OCULOMOTOR EXAM:  Ocular Alignment: normal  Ocular ROM: No Limitations  Spontaneous Nystagmus: absent  Gaze-Induced Nystagmus: absent  Smooth Pursuits: saccades  Saccades: hypometric/undershoots  Convergence/Divergence: appears normal   VESTIBULAR - OCULAR REFLEX:   Slow VOR: Normal  VOR Cancellation: Corrective Saccades  Head-Impulse Test: HIT Right: NT  Dynamic Visual Acuity: NT   POSITIONAL TESTING: Right Dix-Hallpike: no nystagmus Left Dix-Hallpike: no nystagmus  MOTION SENSITIVITY:  Motion Sensitivity Quotient Intensity: 0 = none, 1 = Lightheaded, 2 = Mild, 3 = Moderate, 4 = Severe, 5 = Vomiting  Intensity  1. Sitting to supine 0  2. Supine to L side 0  3. Supine to R side 0  4. Supine to sitting 0  5. L Hallpike-Dix 0  6. Up from L  0  7. R Hallpike-Dix 0  8. Up from R  0  9. Sitting, head tipped to L knee 0  10. Head up from L knee 0  11. Sitting, head tipped to R knee 0  12. Head up from R knee 0  13. Sitting head turns x5 0  14.Sitting head nods x5 0  15. In stance, 180 turn to L  0  16. In stance, 180 turn to R 0    OTHOSTATICS: not done;  However we did orthostatics on a recent visit when he was much more symptomatic and they were normal  ASSESSMENT:  CLINICAL IMPRESSION: See assessment in the below sections  PLAN: We will plan to continue with PT at the regularly scheduled frequency without any changes to treatment plan or goals.   Patient is no longer symptomatic.  We will give him some gaze stabilization exercises and see how he does.  Hopefully symptoms won't return   TODAY'S TREATMENT:  11/14/23 THERAPEUTIC EXERCISE: To improve strength.  Demonstration, verbal and tactile cues throughout for technique. NuStep L6 x 8'  NEUROMUSCULAR  RE-EDUCATION: To improve balance, posture, and proprioception. Tandem stance x 1' x 2 BLE Sidestepping blue TB w/ keeping hips/back to counter for postural correction Back to counter no UE support heel raises x 20;  toe raises x 20   THERAPEUTIC ACTIVITIES: To improve functional performance.  Demonstration, verbal and tactile cues throughout for technique. Seated row 25# x 2/10 close grip BUE;  x 10 wide grip BUE Seated Lat PD 25# x 2/10 BUE Standing hip flexor stretch x 1' BLE Counter back extension x 15 Standing calf stretch x 1' BLE Seated pigeon stretch x 1' BLE  11/11/23 THERAPEUTIC EXERCISE: To improve strength.  Demonstration, verbal and tactile cues throughout for technique. NuStep L6 x 8'  PHYSICAL PERFORMANCE TEST or MEASUREMENT:     THERAPEUTIC ACTIVITIES: To improve  functional performance.  Demonstration, verbal and tactile cues throughout for technique. Seated hamstring stretch x 1' x 2 BLE Seated pigeon stretch x 1' x 2 BLE Standing hip flexor stretch at counter x 1' x 2 BLE Standing back ext at counter x 10 w/ 10 sec holds Standing heel rocking back to counter x 20 BLE Standing calf raise back to counter x 20 BLE Sidestepping blue TB x 5 laps at counter Tandem gait at counter x 5 laps F/B   11/07/23 THERAPEUTIC EXERCISE: To improve strength.  Demonstration, verbal and tactile cues throughout for technique. Bike L0 x 8'  NEUROMUSCULAR RE-EDUCATION: To improve balance, posture, proprioception, and reduce fall risk. Tandem gait at counter F/B  Back to counter toe raises x 2/10 BLE Back to counter heel raises x 2/10 BLE Counter back extension x 10  Standing hip ext stretch at counter x 1' x 2 BLE Seated leg on table ham stretch x 1' x 3 Seated pigeon stretch 1' x 3 RLE Standing toe prop on 1/2 foam roll calf stretch x 1' x 2 Seated rowing 15# close grip x 20;  Wide grip x 20 BUE Standing donkey kick w/ chest press machine bar 5# x 3/10 RLE  PATIENT  EDUCATION:  Education details: PT eval findings, anticipated POC, and initial HEP  Person educated: Patient Education method: Explanation, Demonstration, Verbal cues, Tactile cues, and Handouts Education comprehension: verbalized understanding, verbal cues required, tactile cues required, and needs further education  HOME EXERCISE PROGRAM: Access Code: [email or web address] URL: [email or web address] Date: 10/03/2023 Prepared by: Garnette Montclair  Exercises - Seated Hamstring Stretch  - 1 x daily - 7 x weekly - 1 sets - 1 reps - 1 min hold - Standing Lumbar Extension with Counter  - 1 x daily - 7 x weekly - 1 sets - 10 reps - Supine Lower Trunk Rotation  - 1 x daily - 7 x weekly - 1 sets - 10 reps - Seated Figure 4 Piriformis Stretch  - 1 x daily - 7 x weekly - 1 sets - 1 reps - 1 min hold - Seated Piriformis Stretch  - 1 x daily - 7 x weekly - 1 sets - 1 reps - 1 min hold - Supine Bridge  - 1 x daily - 7 x weekly - 3 sets - 10 reps   ASSESSMENT:  CLINICAL IMPRESSION: Patient has been seen x 20 PT visits initially for unsteady gait and falls, then a second referral for LBP/RLE pain, then a third referral for BPPV.    He has had no c/o dizziness or vertigo since we evaluated him for BPPV.   He still c/o feeling like he is falling forward at times, but has not had any falls over the last 10 visits with PT.   He continues to have some R low back pain that flared up severely after one of our treatment 2 weeks ago, but this seems to have subsided.   He has made good overall progress to goals.  His lumbar ROM has improved by approx 10% in most directions.  His BLE strength has improved by 1/2 grade for most motions.   TUG, gait speed, and 5X STS have all improved.   His biggest improvement is with his BERG score which has improved from 40/56 to 46/46.    He still exhibits severe tightness in his hamstrings BLE.  HE has 2 more PT visits and then plan to D/C.   His episode of care has been longer  than anticipated due to the 3 different diagnoses for therapy and the patient's underlying dementia which has been a barrier to care.   However he should be at a good place to D/C  in 2 visits.   Discussed that he will likely need to begin using a walker in next 1-2 yrs, though, which he is not happy about.   Next 2 visits will be repetitious of the last 2 so that patient can have better recall of his HEP.   He does have some short term memory ability and does remember some of his exercises when we go over them.     REEVAL for vertigo:  Patient has a new order for PT for BPPV.   He was having a lot of dizziness with symptoms of room spinning last 2-3 weeks.  So much so that they went to [facility] and had a head CT done.   The head CT was negative and his spouse/caregiver reports that his symptoms have resolved now.   She doesn't even feel his vertigo/dizziness is an issue now.   The patient is taken thru [facility] dix testing and there are no symptoms of dizziness or nystagmus noted.   He does have some evidence of a vestibular hypofunction since there is some nystagmus present on today's testing.   However, if he is not symptomatic I don't think he needs a lot of therapy focused on this.  His dementia already prevents him from being compliant with his home exercises and his spouse/caregiver confirms this.   We will continue PT for his regularly scheduled frequency for his back pain and balance deficits, but can add in vestibular treatments PRN.  Patient/caregiver are agreeable to this plan    RECERTIFICATION:  Patient has been seen x 2 months PT initially for unsteady gait and falls and more recently for LBP with R sided sciatica.  Initially his balance made very good improvement so we were able to focus more on his LBP.   He continues to c/o increased LBP in the AM hours that improves as the day goes on.   Spouse reports he gets up in the morning in a lot of back and RLE pain, though.   He has severely tight  hamstrings and I doubt he is remembering to stretch them at home due to his dementia which is a barrier to progress.   We are continuing to progress his lumbar exercises as able.   Spouse also reports that over the last week the patient has been significantly more unsteady for no apparent reason.  His 5X sit to stand score has improved from 18.29 to 17.51 sec, and TUG score has improved from 15.25 to 14.38 sec.   His Berg Score today is 40/56 indicating that he still needs to be using a walker.  He is very unsteady in the clinic today in comparison to the last 3 weeks.    He is going to have a brain MRI after leaving therapy today so we will know more once the results are in.   We will continue PT to address his LBP, but also his unsteady gait, dizziness, balance deficits.  He is agreeable to continue PT per POC.   OBJECTIVE IMPAIRMENTS: Abnormal gait, decreased balance, difficulty walking, decreased ROM, decreased strength, decreased safety awareness, impaired perceived functional ability, impaired flexibility, improper body mechanics, postural dysfunction, and pain.   ACTIVITY LIMITATIONS: carrying, lifting, bending, stairs, and locomotion level  PARTICIPATION LIMITATIONS: community activity  PERSONAL FACTORS: Age  and 1-2 comorbidities:   dementia, blood clotting disorder (Factor V), h/o DVT and PE, ataxia, LBP, moderate aortic sclerosis are also affecting patient's functional outcome.   REHAB POTENTIAL: Good  CLINICAL DECISION MAKING: Evolving/moderate complexity  EVALUATION COMPLEXITY: Moderate   GOALS: Goals reviewed with patient? Yes  SHORT TERM GOALS: Target date: 11/16/2023   Patient will be independent with initial HEP to improve outcomes and carryover.  Baseline: 100% PT assist required for correct completion 9/8:  Needs assist  10/29/23:  patient is able to return demo his basic stretches today Goal status: MET  2.  Patient will report 25% improvement in low back pain to  improve QOL. Baseline: variable pain day to day with 7/10 worst 10/14/23:  tells me worst pain over weekend was 5/10 10/29/23:  states 0/10 today; 3/10 this am Goal status: MET  LONG TERM GOALS: Target date: 12/14/2023   Patient will be independent with ongoing/advanced HEP for self-management at home.  Baseline: no advanced lumbar HEP yet 10/14/23:  progressing with more advanced exercises Goal status: IN PROGRESS  2.  Patient will rep/ort 50-75% improvement in low back pain to improve QOL. / Baseline: 7/10 worst:  10/29/23:  2-3/10 worst  Goal status: IN PROGRESS  3.  Patient to demonstrate ability to achieve and maintain good spinal alignment/posturing and body mechanics needed for daily activities. Baseline: flat back, flexed hips in standing with forward head 11/14/23:  some lordosis is present now, but very minimal;  He can hold his head upright for longer with ambulation than initially Goal status: IN PROGRESS  4.  Patient will demonstrate increased lumbar ROM  by 10-20% in all planes to perform ADLs.   Baseline: Refer to above lumbar ROM table 11/14/23:  see updates to tables Goal status: IN PROGRESS  5.  Patient will demonstrate improved BLE strength to >/= 4+/5 for improved stability and ease of mobility. Baseline: Refer to above LE MMT table 10/29/23:  prone hip extension today = 4- RLE 11/14/23:  see updates to tables Goal status: IN PROGRESS  7.  Patient will tolerate 20-30 min of (standing/sitting/walking) w/o increased pain to allow for  improved mobility and activity tolerance and be able to go shopping with his spouse Baseline: able to access community with spouse supervision/assistance but in pain 11/14/23:  spouse reports patient is walking a little better with less pain especially after his AM tylenol  Goal status: IN PROGRESS  8.  Patient will improve Berg balance score to >/= 48/56 to allow for gait with a cane rather than needing a walker for community  access  Baseline: 10/16/23:  40/56 indicating patient needs a walker all times  11/11/23:  46/56  Goal status:  IN PROGRESS  9.  Patient will improve 5xSTS time to </= 12 seconds for improved efficiency and safety with transfers. Baseline: 10/16/23:  17.51 sec 11/11/23:  13.26 sec 11/14/23: 13.1 sec Goal status: IN PROGRESS    10.  Patient will demonstrate gait speed of >/= 1.8 ft/sec (0.55 m/s) to be a safe limited community ambulator with decreased risk for recurrent falls.  Baseline: 10/16/23:  tug = 14.38 sec 11/11/23:  2.77 FT/SEC gait speed   TUG = 12.53 sec Goal status:MET  PLAN:  PT FREQUENCY: 1-2x/week  PT DURATION: 6 weeks  PLANNED INTERVENTIONS: 97164- PT Re-evaluation, 97750- Physical Performance Testing, 97110-Therapeutic exercises, 97530- Therapeutic activity, [id number]- Neuromuscular re-education, 97535- Self Care, [id number]- Manual therapy, [id number]- Gait training, [id number]- Electrical stimulation (unattended), 97016- Vasopneumatic device, [id number]- Ultrasound, [id number]-  Traction (mechanical), 02966- Ionotophoresis 4mg /ml Dexamethasone , Taping, Joint mobilization, Spinal mobilization, Cryotherapy, and Moist heat  PLAN FOR NEXT SESSION:  Review stretching and balance exercises from last 2 visits.   Add prone hip ext if he can tolerate it.   Kirti Carl, PT 11/14/2023, 1:32 PM

## 2023-11-20 ENCOUNTER — Encounter: Payer: Self-pay | Admitting: Rehabilitation

## 2023-11-20 ENCOUNTER — Ambulatory Visit: Admitting: Rehabilitation

## 2023-11-20 DIAGNOSIS — R269 Unspecified abnormalities of gait and mobility: Secondary | ICD-10-CM

## 2023-11-20 DIAGNOSIS — M5459 Other low back pain: Secondary | ICD-10-CM

## 2023-11-20 DIAGNOSIS — R2681 Unsteadiness on feet: Secondary | ICD-10-CM

## 2023-11-20 DIAGNOSIS — H8113 Benign paroxysmal vertigo, bilateral: Secondary | ICD-10-CM

## 2023-11-20 DIAGNOSIS — M6281 Muscle weakness (generalized): Secondary | ICD-10-CM

## 2023-11-20 DIAGNOSIS — R296 Repeated falls: Secondary | ICD-10-CM

## 2023-11-20 NOTE — Therapy (Signed)
 OUTPATIENT PHYSICAL THERAPY BALANCE / THORACOLUMBAR TREATMENT  Progress Note Reporting Period 10/03/23 to 11/20/2023   See note below for Objective Data and Assessment of Progress/Goals.      Patient Name: Shawn [name] MRN: [id number] DOB:06-24-1934, 89 y.o., male Today's Date: 11/20/2023  END OF SESSION:  PT End of Session - 11/20/23 1127     Visit Number 21    Date for Recertification  10/17/23    Authorization Type aetna medicare    PT Start Time 1101    PT Stop Time 1144    PT Time Calculation (min) 43 min    Activity Tolerance Patient tolerated treatment well;No increased pain    Behavior During Therapy [facility] for tasks assessed/performed               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   Generalized anxiety disorder 10/30/2021   Hemorrhoids    Hepatic cyst 11/19/2007   High serum vitamin D  01/12/2020   History of blood clots    History of colon polyps 11/02/2011   hyperplastic   Hyperlipidemia    Impacted cerumen 07/29/2009   Major depressive disorder 10/30/2021   Osteoarthritis    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   Otitis externa 07/07/2021   Overweight 10/05/2010   Pain of left thumb 02/20/2016   Pulmonary embolism (HCC) 11/20/2009   Right groin pain 11/16/2021   Thrombocytopenia 12/01/2009   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   Past Surgical History:  Procedure Laterality Date   APPENDECTOMY     CATARACT EXTRACTION  02/09/2007   right  eye   INGUINAL HERNIA REPAIR     INGUINAL HERNIA REPAIR Right 01/05/2022   Procedure: RIGHT HERNIA REPAIR RECURRENT INGUINAL WITH THREE BY SIX POLYPROPYLENE MESH;  Surgeon: Sebastian Moles, MD;  Location: [facility] OR;  Service: General;  Laterality: Right;  GEN AND TAP BLOCK   TONSILLECTOMY     VARICOSE VEIN SURGERY     VASECTOMY     Patient Active Problem List   Diagnosis Date Noted   Gait disorder 10/14/2023   Dizziness 10/14/2023   Paresthesia 10/14/2023   Family history of early CAD 04/26/2023   Essential hypertension 04/26/2023   Major depressive disorder 10/30/2021   Generalized anxiety disorder 10/30/2021   Ataxia of right upper extremity 08/09/2021   Dementia due to Alzheimer's disease 03/18/2020   High serum vitamin D  01/12/2020   Pain of left thumb 02/20/2016   History of colon polyps 11/02/2011   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   Overweight 10/05/2010   Backache 04/21/2010   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   Thrombocytopenia 12/01/2009   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   Aortic valve disorder 11/19/2007   Benign neoplasm of skin 08/11/2007   Hyperlipidemia 09/04/2006   Osteoarthritis 09/04/2006   Dysphagia 09/04/2006    PCP: Antonio Cyndee Jamee JONELLE, DO   REFERRING PROVIDER: Daryl Setter, NP   REFERRING DIAG: [phone] (ICD-10-CM) - Chronic  bilateral low back pain with right-sided sciatica   THERAPY DIAG:  Other low back pain  Muscle weakness (generalized)  Gait abnormality  Unsteadiness on feet  Benign paroxysmal positional vertigo due to bilateral vestibular disorder  Repeated falls  RATIONALE FOR EVALUATION AND TREATMENT: Rehabilitation  ONSET DATE: ?  Recent months per patient  NEXT MD VISIT:    SUBJECTIVE:                                                                                                                                                                                                          SUBJECTIVE STATEMENT:  Patient denies falls, dizziness, or much back pain.  Spouse confirms what he is saying is accurate  PAIN: Are you having pain? Yes: NPRS scale: wong-baker faces;  0/10 now; 7/10 worst over the last week Pain location: low back and R hip/posterior thigh Pain description: aching Aggravating factors: can't give any Relieving factors: meds  PERTINENT HISTORY:  dementia, blood clotting disorder (Factor V), h/o DVT and PE, ataxia, LBP, moderate aortic sclerosis  PRECAUTIONS: Fall  RED FLAGS: None  WEIGHT BEARING RESTRICTIONS: No  FALLS:  Has patient fallen in last 6 months? 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   RADIATION DOSE REDUCTION: This exam was performed according to the departmental dose-optimization program which includes automated exposure control, adjustment of the mA and/or kV according to patient size and/or use of iterative reconstruction technique.   COMPARISON:  August 10, 2023   FINDINGS: Brain: There is generalized cerebral atrophy with widening of the extra-axial spaces and ventricular dilatation. There are areas of decreased attenuation within the white matter tracts of the supratentorial brain, consistent with microvascular disease changes.   Vascular: Marked severity  bilateral cavernous carotid artery calcification is noted.   Skull: Normal. Negative for fracture or focal lesion.   Sinuses/Orbits: No acute finding.   Other: None.   IMPRESSION: 1. Generalized cerebral atrophy with widening  of the extra-axial spaces and ventricular dilatation. 2. No acute intracranial abnormality.     Electronically Signed   By: Suzen Dials M.D.   On: 10/16/2023 13:10  PATIENT SURVEYS:  Patient can't reliably answer any surverys  SCREENING FOR RED FLAGS: Bowel or bladder incontinence: No Spinal tumors: No Cauda equina syndrome: No Compression fracture: No Abdominal aneurysm: No  COGNITION:  Overall cognitive status: Impaired    SENSATION: WFL  POSTURE:  rounded shoulders, forward head, and decreased lumbar lordosis  PALPATION: Unable to palpate any areas of pain today with deep pressure to his low back, pelvis, hips, and with vertebral P/A glides from T7 to L5  LUMBAR ROM: p!= pain  Active  Eval 11/14/23 11/20/23  Flexion To knees; p! midshins Mid shins; no p!  Extension 30% 40% 65%; no p!  Right lateral flexion To mid thigh To knee joint Just below knee; no p!  Left lateral flexion To mid thigh To knee joint To knee  Right rotation 50% 60% 80%; no p!  Left rotation 50% 60% 70%; no p!  (Blank rows = not tested)  MUSCLE LENGTH: Hamstrings: Right SLR 40 deg; Left SLR 50 deg Thomas test: Right 10 deg; Left NT  Hamstrings: 90/90 is 45 deg RLE;  50 deg LLE ITB: NT Piriformis: moderately stiff R, mildly stiff LLE Hip flexors: minimal tightness Quads: NT Heelcord: NT  LOWER EXTREMITY ROM:     Passive  Right eval Left eval  Hip flexion    Hip extension    Hip abduction    Hip adduction    Hip internal rotation 18 24  Hip external rotation 33 39  Knee flexion    Knee extension    Ankle dorsiflexion    Ankle plantarflexion    Ankle inversion    Ankle eversion    (Blank rows = not tested)  LOWER EXTREMITY MMT:    MMT Right  eval Left eval RLE 10/16/23 LLE 10/16/23 RLE  11/14/23 LLE 11/14/23 RLE 11/20/23 LLE 11/20/23  Hip flexion 4 4 4+ 4 4+ 4+ 5 5  Hip extension 3+ 3+ 4- 4- 4 4    Hip abduction 4- 4- 4- 4- 4 4 4  4+  Hip adduction          Hip internal rotation 4- 4 4 4 4  4+ 5 5  Hip external rotation 4 4+ 4 4+ 5 5 5 5   Knee flexion 4 4 4+ 4+ 5 5 5 5   Knee extension 5 5 5 5 5 5 5 5   Ankle dorsiflexion 4 4 4 4 4  4+ 4+ 4+  Ankle plantarflexion       4 4  Ankle inversion          Ankle eversion           (Blank rows = not tested)  LUMBAR SPECIAL TESTS:  Straight leg raise test: Negative, Slump test: Negative, SI Compression/distraction test: Negative, and FABER test: Negative  FUNCTIONAL TESTS:  10/16/23:  BERG = 40/56  Gait speed = 1.2 ft/sec  5X STS = 17.51 sec  TUG = 14.38 sec  GAIT: Distance walked: into clinic from parking lot Assistive device utilized: None Level of assistance: CGA Gait pattern: decreased arm swing- Right, decreased arm swing- Left, decreased step length- Right, decreased step length- Left, decreased stance time- Right, decreased stance time- Left, decreased stride length, decreased ankle dorsiflexion- Right, and decreased ankle dorsiflexion- Left Comments: He ambulates with no device with unsteadiness, short shuffling gait  pattern with no trunk or arm movement and very flat back, rounded shoulders   VESTIBULAR ASSESSMENT:  PATIENT SURVEYS:  DHI = 16/100  GENERAL OBSERVATION: forward head, minimal neck movement with gait or at any time   SYMPTOM BEHAVIOR:  Subjective history: patient has dementia with memory deficits so this is difficult.  Caregiver states he gets dizzy when he gets up from a seated position too quickly.    However, she states when he last had the issue that when he sat down he felt like the room was spinning for about 30 minutes.   However, he has had no symptoms in the last 1 week.   Non-Vestibular symptoms: denies any neck pain, HA, etc  Type of  dizziness: Spinning/Vertigo  Frequency: spouse/caregiver reports multiple times daily when it was occurring, but it's not now  Duration: up to 1 hour  Aggravating factors: Induced by position change: lying supine, supine to sit, and sit to stand  Relieving factors: no known relieving factors  Progression of symptoms: better--resolved per spouse  OCULOMOTOR EXAM:  Ocular Alignment: normal  Ocular ROM: No Limitations  Spontaneous Nystagmus: absent  Gaze-Induced Nystagmus: absent  Smooth Pursuits: saccades  Saccades: hypometric/undershoots  Convergence/Divergence: appears normal   VESTIBULAR - OCULAR REFLEX:   Slow VOR: Normal  VOR Cancellation: Corrective Saccades  Head-Impulse Test: HIT Right: NT  Dynamic Visual Acuity: NT   POSITIONAL TESTING: Right Dix-Hallpike: no nystagmus Left Dix-Hallpike: no nystagmus  MOTION SENSITIVITY:  Motion Sensitivity Quotient Intensity: 0 = none, 1 = Lightheaded, 2 = Mild, 3 = Moderate, 4 = Severe, 5 = Vomiting  Intensity  1. Sitting to supine 0  2. Supine to L side 0  3. Supine to R side 0  4. Supine to sitting 0  5. L Hallpike-Dix 0  6. Up from L  0  7. R Hallpike-Dix 0  8. Up from R  0  9. Sitting, head tipped to L knee 0  10. Head up from L knee 0  11. Sitting, head tipped to R knee 0  12. Head up from R knee 0  13. Sitting head turns x5 0  14.Sitting head nods x5 0  15. In stance, 180 turn to L  0  16. In stance, 180 turn to R 0    OTHOSTATICS: not done;  However we did orthostatics on a recent visit when he was much more symptomatic and they were normal  ASSESSMENT:  CLINICAL IMPRESSION: See assessment in the below sections  PLAN: We will plan to continue with PT at the regularly scheduled frequency without any changes to treatment plan or goals.   Patient is no longer symptomatic.  We will give him some gaze stabilization exercises and see how he does.  Hopefully symptoms won't return   TODAY'S TREATMENT:   11/20/23 THERAPEUTIC EXERCISE: To improve strength.  Demonstration, verbal and tactile cues throughout for technique. NuStep L6 x 8'  THERAPEUTIC ACTIVITIES: To improve functional performance.  Demonstration, verbal and tactile cues throughout for technique. Rechecked lumbar ROM and strength Sidestepping blue TB x 5 laps at counter Back to counter heel raises x 20 BLE Lumbar extension at counter x 10 Seated hip flexor stretch x 1' x 2 BLE Seated FABER piriformis stretch x 1' x 2 BLE Seated hamstring stretch w/ leg propped up on mat table x 1' x 2 RLE  NEUROMUSCULAR RE-EDUCATION: To improve balance and posture. Diagonal steps over PVC X cross x 10 each direction   11/14/23 THERAPEUTIC  EXERCISE: To improve strength.  Demonstration, verbal and tactile cues throughout for technique. NuStep L6 x 8'  NEUROMUSCULAR RE-EDUCATION: To improve balance, posture, and proprioception. Tandem stance x 1' x 2 BLE Sidestepping blue TB w/ keeping hips/back to counter for postural correction Back to counter no UE support heel raises x 20;  toe raises x 20   THERAPEUTIC ACTIVITIES: To improve functional performance.  Demonstration, verbal and tactile cues throughout for technique. Seated row 25# x 2/10 close grip BUE;  x 10 wide grip BUE Seated Lat PD 25# x 2/10 BUE Standing hip flexor stretch x 1' BLE Counter back extension x 15 Standing calf stretch x 1' BLE Seated pigeon stretch x 1' BLE  PATIENT EDUCATION:  Education details: PT eval findings, anticipated POC, and initial HEP  Person educated: Patient Education method: Explanation, Demonstration, Verbal cues, Tactile cues, and Handouts Education comprehension: verbalized understanding, verbal cues required, tactile cues required, and needs further education  HOME EXERCISE PROGRAM: Access Code: [email or web address] URL: [email or web address] Date: 10/03/2023 Prepared by: Garnette Montclair  Exercises - Seated Hamstring Stretch  - 1 x  daily - 7 x weekly - 1 sets - 1 reps - 1 min hold - Standing Lumbar Extension with Counter  - 1 x daily - 7 x weekly - 1 sets - 10 reps - Supine Lower Trunk Rotation  - 1 x daily - 7 x weekly - 1 sets - 10 reps - Seated Figure 4 Piriformis Stretch  - 1 x daily - 7 x weekly - 1 sets - 1 reps - 1 min hold - Seated Piriformis Stretch  - 1 x daily - 7 x weekly - 1 sets - 1 reps - 1 min hold - Supine Bridge  - 1 x daily - 7 x weekly - 3 sets - 10 reps   ASSESSMENT:  CLINICAL IMPRESSION: Patient has made good progress with PT and will be ready for D/C next visit.   He is ambulating as well as we would expect given his age and dementia dx.   He has improved with his lumbar ROM since starting PT which is noteworthy since initially had min to no lumbar extension.  His hamstrings remain extremely tight and impact his LBP as well as gait due to the posterior pelvic tilt effect.   We continue to recommend that he stretch hamstrings aggressively.  D/C to HEP next visit  REEVAL for vertigo:  Patient has a new order for PT for BPPV.   He was having a lot of dizziness with symptoms of room spinning last 2-3 weeks.  So much so that they went to [facility] and had a head CT done.   The head CT was negative and his spouse/caregiver reports that his symptoms have resolved now.   She doesn't even feel his vertigo/dizziness is an issue now.   The patient is taken thru [facility] dix testing and there are no symptoms of dizziness or nystagmus noted.   He does have some evidence of a vestibular hypofunction since there is some nystagmus present on today's testing.   However, if he is not symptomatic I don't think he needs a lot of therapy focused on this.  His dementia already prevents him from being compliant with his home exercises and his spouse/caregiver confirms this.   We will continue PT for his regularly scheduled frequency for his back pain and balance deficits, but can add in vestibular treatments PRN.  Patient/caregiver are  agreeable to this plan    RECERTIFICATION:  Patient has been seen x 2 months PT initially for unsteady gait and falls and more recently for LBP with R sided sciatica.  Initially his balance made very good improvement so we were able to focus more on his LBP.   He continues to c/o increased LBP in the AM hours that improves as the day goes on.   Spouse reports he gets up in the morning in a lot of back and RLE pain, though.   He has severely tight hamstrings and I doubt he is remembering to stretch them at home due to his dementia which is a barrier to progress.   We are continuing to progress his lumbar exercises as able.   Spouse also reports that over the last week the patient has been significantly more unsteady for no apparent reason.  His 5X sit to stand score has improved from 18.29 to 17.51 sec, and TUG score has improved from 15.25 to 14.38 sec.   His Berg Score today is 40/56 indicating that he still needs to be using a walker.  He is very unsteady in the clinic today in comparison to the last 3 weeks.    He is going to have a brain MRI after leaving therapy today so we will know more once the results are in.   We will continue PT to address his LBP, but also his unsteady gait, dizziness, balance deficits.  He is agreeable to continue PT per POC.   OBJECTIVE IMPAIRMENTS: Abnormal gait, decreased balance, difficulty walking, decreased ROM, decreased strength, decreased safety awareness, impaired perceived functional ability, impaired flexibility, improper body mechanics, postural dysfunction, and pain.   ACTIVITY LIMITATIONS: carrying, lifting, bending, stairs, and locomotion level  PARTICIPATION LIMITATIONS: community activity  PERSONAL FACTORS: Age and 1-2 comorbidities:   dementia, blood clotting disorder (Factor V), h/o DVT and PE, ataxia, LBP, moderate aortic sclerosis are also affecting patient's functional outcome.   REHAB POTENTIAL: Good  CLINICAL DECISION MAKING: Evolving/moderate  complexity  EVALUATION COMPLEXITY: Moderate   GOALS: Goals reviewed with patient? Yes  SHORT TERM GOALS: Target date: 11/16/2023   Patient will be independent with initial HEP to improve outcomes and carryover.  Baseline: 100% PT assist required for correct completion 9/8:  Needs assist  10/29/23:  patient is able to return demo his basic stretches today Goal status: MET  2.  Patient will report 25% improvement in low back pain to improve QOL. Baseline: variable pain day to day with 7/10 worst 10/14/23:  tells me worst pain over weekend was 5/10 10/29/23:  states 0/10 today; 3/10 this am Goal status: MET  LONG TERM GOALS: Target date: 12/14/2023   Patient will be independent with ongoing/advanced HEP for self-management at home.  Baseline: no advanced lumbar HEP yet 10/14/23:  progressing with more advanced exercises 11/20/23:  has a good HEP to continue at home, but will require constant assistance with completion due to dementia Goal status: MET  2.  Patient will rep/ort 50-75% improvement in low back pain to improve QOL. / Baseline: 7/10 worst:  10/29/23:  2-3/10 worst  11/20/23:  1/10 Goal status: MET  3.  Patient to demonstrate ability to achieve and maintain good spinal alignment/posturing and body mechanics needed for daily activities. Baseline: flat back, flexed hips in standing with forward head 11/14/23:  some lordosis is present now, but very minimal;  He can hold his head upright for longer with ambulation than initially 11/20/23:  with cueing he can stand with mostly normal posture but does  revert back to habitual flexion quickly Goal status: MET  4.  Patient will demonstrate increased lumbar ROM  by 10-20% in all planes to perform ADLs.   Baseline: Refer to above lumbar ROM table 11/14/23:  see updates to tables 11/20/23:  see tables above Goal status: MET  5.  Patient will demonstrate improved BLE strength to >/= 4+/5 for improved stability and ease of  mobility. Baseline: Refer to above LE MMT table 10/29/23:  prone hip extension today = 4- RLE 11/14/23:  see updates to tables 11/20/23:  see tables Goal status: MET  7.  Patient will tolerate 20-30 min of (standing/sitting/walking) w/o increased pain to allow for  improved mobility and activity tolerance and be able to go shopping with his spouse Baseline: able to access community with spouse supervision/assistance but in pain 11/14/23:  spouse reports patient is walking a little better with less pain especially after his AM tylenol  Goal status: IN PROGRESS  8.  Patient will improve Berg balance score to >/= 48/56 to allow for gait with a cane rather than needing a walker for community access  Baseline: 10/16/23:  40/56 indicating patient needs a walker all times  11/11/23:  46/56  Goal status:  IN PROGRESS  9.  Patient will improve 5xSTS time to </= 12 seconds for improved efficiency and safety with transfers. Baseline: 10/16/23:  17.51 sec 11/11/23:  13.26 sec 11/14/23: 13.1 sec Goal status: IN PROGRESS    10.  Patient will demonstrate gait speed of >/= 1.8 ft/sec (0.55 m/s) to be a safe limited community ambulator with decreased risk for recurrent falls.  Baseline: 10/16/23:  tug = 14.38 sec 11/11/23:  2.77 FT/SEC gait speed   TUG = 12.53 sec Goal status:MET  PLAN:  PT FREQUENCY: 1-2x/week  PT DURATION: 6 weeks  PLANNED INTERVENTIONS: 97164- PT Re-evaluation, 97750- Physical Performance Testing, 97110-Therapeutic exercises, 97530- Therapeutic activity, 97112- Neuromuscular re-education, 97535- Self Care, [id number]- Manual therapy, [id number]- Gait training, [id number]- Electrical stimulation (unattended), 97016- Vasopneumatic device, [id number]- Ultrasound, 02987- Traction (mechanical), [id number]- Ionotophoresis 4mg /ml Dexamethasone , Taping, Joint mobilization, Spinal mobilization, Cryotherapy, and Moist heat  PLAN FOR NEXT SESSION:  Review stretching and balance exercises from last 2 visits.   Add prone hip  ext if he can tolerate it.   Ercel Normoyle, PT 11/20/2023, 8:13 PM

## 2023-11-22 ENCOUNTER — Encounter: Payer: Self-pay | Admitting: Rehabilitation

## 2023-11-22 ENCOUNTER — Ambulatory Visit: Admitting: Rehabilitation

## 2023-11-22 DIAGNOSIS — H8113 Benign paroxysmal vertigo, bilateral: Secondary | ICD-10-CM

## 2023-11-22 DIAGNOSIS — R2681 Unsteadiness on feet: Secondary | ICD-10-CM

## 2023-11-22 DIAGNOSIS — R269 Unspecified abnormalities of gait and mobility: Secondary | ICD-10-CM

## 2023-11-22 DIAGNOSIS — M5459 Other low back pain: Secondary | ICD-10-CM

## 2023-11-22 DIAGNOSIS — R296 Repeated falls: Secondary | ICD-10-CM

## 2023-11-22 DIAGNOSIS — M6281 Muscle weakness (generalized): Secondary | ICD-10-CM

## 2023-11-22 NOTE — Therapy (Signed)
 OUTPATIENT PHYSICAL THERAPY BALANCE / THORACOLUMBAR TREATMENT/ DC SUMMARY  Patient Name: Shawn [name] MRN: [id number] DOB:[birth year], 89 y.o., male Today's Date: 11/22/2023  END OF SESSION:  PT End of Session - 11/22/23 1101     Visit Number 22    Date for Recertification  10/17/23    Authorization Type aetna medicare    PT Start Time 1100    PT Stop Time 1132 (P)     PT Time Calculation (min) 32 min (P)     Activity Tolerance Patient tolerated treatment well;No increased pain    Behavior During Therapy [facility] for tasks assessed/performed               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   Generalized anxiety disorder 10/30/2021   Hemorrhoids    Hepatic cyst 11/19/2007   High serum vitamin D  01/12/2020   History of blood clots    History of colon polyps 11/02/2011   hyperplastic   Hyperlipidemia    Impacted cerumen 07/29/2009   Major depressive disorder 10/30/2021   Osteoarthritis    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   Otitis externa 07/07/2021   Overweight 10/05/2010   Pain of left thumb 02/20/2016   Pulmonary embolism (HCC) 11/20/2009   Right groin pain 11/16/2021   Thrombocytopenia 12/01/2009   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   Past Surgical History:  Procedure Laterality Date   APPENDECTOMY     CATARACT EXTRACTION  02/09/2007   right eye   INGUINAL HERNIA REPAIR     INGUINAL HERNIA REPAIR Right 01/05/2022   Procedure: RIGHT HERNIA REPAIR  RECURRENT INGUINAL WITH THREE BY SIX POLYPROPYLENE MESH;  Surgeon: Sebastian Moles, MD;  Location: [facility] OR;  Service: General;  Laterality: Right;  GEN AND TAP BLOCK   TONSILLECTOMY     VARICOSE VEIN SURGERY     VASECTOMY     Patient Active Problem List   Diagnosis Date Noted   Gait disorder 10/14/2023   Dizziness 10/14/2023   Paresthesia 10/14/2023   Family history of early CAD 04/26/2023   Essential hypertension 04/26/2023   Major depressive disorder 10/30/2021   Generalized anxiety disorder 10/30/2021   Ataxia of right upper extremity 08/09/2021   Dementia due to Alzheimer's disease 03/18/2020   High serum vitamin D  01/12/2020   Pain of left thumb 02/20/2016   History of colon polyps 11/02/2011   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   Overweight 10/05/2010   Backache 04/21/2010   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   Thrombocytopenia 12/01/2009   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   Aortic valve disorder 11/19/2007   Benign neoplasm of skin 08/11/2007   Hyperlipidemia 09/04/2006   Osteoarthritis 09/04/2006   Dysphagia 09/04/2006    PCP: Antonio Cyndee Jamee JONELLE, DO   REFERRING PROVIDER: Daryl Setter, NP   REFERRING DIAG: G89.29,M54.41 (ICD-10-CM) - Chronic bilateral low back pain with right-sided sciatica   THERAPY DIAG:  Other low back pain  Muscle  weakness (generalized)  Gait abnormality  Unsteadiness on feet  Repeated falls  Benign paroxysmal positional vertigo due to bilateral vestibular disorder  RATIONALE FOR EVALUATION AND TREATMENT: Rehabilitation  ONSET DATE: ?  Recent months per patient  NEXT MD VISIT:    SUBJECTIVE:                                                                                                                                                                                                         SUBJECTIVE STATEMENT:  Patient is feeling good today per his report.  Spouse confirms.  Patient  feels he has made good improvement with PT and feels ok to DC.   Spouse is in agreement.  PAIN: Are you having pain? Yes: NPRS scale: wong-baker faces;  0/10 now; 7/10 worst over the last week Pain location: low back and R hip/posterior thigh Pain description: aching Aggravating factors: can't give any Relieving factors: meds  PERTINENT HISTORY:  dementia, blood clotting disorder (Factor V), h/o DVT and PE, ataxia, LBP, moderate aortic sclerosis  PRECAUTIONS: Fall  RED FLAGS: None  WEIGHT BEARING RESTRICTIONS: No  FALLS:  Has patient fallen in last 6 months? 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   RADIATION DOSE REDUCTION: This exam was performed according to the departmental dose-optimization program which includes automated exposure control, adjustment of the mA and/or kV according to patient size and/or use of iterative reconstruction technique.   COMPARISON:  August 10, 2023   FINDINGS: Brain: There is generalized cerebral atrophy with widening of the extra-axial spaces and ventricular dilatation. There are areas of decreased attenuation within the white matter tracts of the supratentorial brain, consistent with microvascular disease changes.   Vascular: Marked severity bilateral cavernous carotid  artery calcification is noted.   Skull: Normal. Negative for fracture or focal lesion.   Sinuses/Orbits: No acute finding.   Other: None.   IMPRESSION: 1. Generalized cerebral atrophy with widening of the extra-axial  spaces and ventricular dilatation. 2. No acute intracranial abnormality.     Electronically Signed   By: Suzen Dials M.D.   On: 10/16/2023 13:10  PATIENT SURVEYS:  Patient can't reliably answer any surverys  SCREENING FOR RED FLAGS: Bowel or bladder incontinence: No Spinal tumors: No Cauda equina syndrome: No Compression fracture: No Abdominal aneurysm: No  COGNITION:  Overall cognitive status: Impaired    SENSATION: WFL  POSTURE:  rounded shoulders, forward head, and decreased lumbar lordosis  PALPATION: Unable to palpate any areas of pain today with deep pressure to his low back, pelvis, hips, and with vertebral P/A glides from T7 to L5  LUMBAR ROM: p!= pain  Active  Eval 11/14/23 11/20/23  Flexion To knees; p! midshins Mid shins; no p!  Extension 30% 40% 65%; no p!  Right lateral flexion To mid thigh To knee joint Just below knee; no p!  Left lateral flexion To mid thigh To knee joint To knee  Right rotation 50% 60% 80%; no p!  Left rotation 50% 60% 70%; no p!  (Blank rows = not tested)  MUSCLE LENGTH: Hamstrings: Right SLR 40 deg; Left SLR 50 deg Thomas test: Right 10 deg; Left NT  Hamstrings: 90/90 is 45 deg RLE;  50 deg LLE ITB: NT Piriformis: moderately stiff R, mildly stiff LLE Hip flexors: minimal tightness Quads: NT Heelcord: NT  LOWER EXTREMITY ROM:     Passive  Right eval Left eval  Hip flexion    Hip extension    Hip abduction    Hip adduction    Hip internal rotation 18 24  Hip external rotation 33 39  Knee flexion    Knee extension    Ankle dorsiflexion    Ankle plantarflexion    Ankle inversion    Ankle eversion    (Blank rows = not tested)  LOWER EXTREMITY MMT:    MMT Right eval Left eval RLE 10/16/23  LLE 10/16/23 RLE  11/14/23 LLE 11/14/23 RLE 11/20/23 LLE 11/20/23  Hip flexion 4 4 4+ 4 4+ 4+ 5 5  Hip extension 3+ 3+ 4- 4- 4 4    Hip abduction 4- 4- 4- 4- 4 4 4  4+  Hip adduction          Hip internal rotation 4- 4 4 4 4  4+ 5 5  Hip external rotation 4 4+ 4 4+ 5 5 5 5   Knee flexion 4 4 4+ 4+ 5 5 5 5   Knee extension 5 5 5 5 5 5 5 5   Ankle dorsiflexion 4 4 4 4 4  4+ 4+ 4+  Ankle plantarflexion       4 4  Ankle inversion          Ankle eversion           (Blank rows = not tested)  LUMBAR SPECIAL TESTS:  Straight leg raise test: Negative, Slump test: Negative, SI Compression/distraction test: Negative, and FABER test: Negative  FUNCTIONAL TESTS:  10/16/23:  BERG = 40/56  Gait speed = 1.2 ft/sec  5X STS = 17.51 sec  TUG = 14.38 sec  GAIT: Distance walked: into clinic from parking lot Assistive device utilized: None Level of assistance: CGA Gait pattern: decreased arm swing- Right, decreased arm swing- Left, decreased step length- Right, decreased step length- Left, decreased stance time- Right, decreased stance time- Left, decreased stride length, decreased ankle dorsiflexion- Right, and decreased ankle dorsiflexion- Left Comments: He ambulates with no device with unsteadiness, short shuffling gait pattern with no  trunk or arm movement and very flat back, rounded shoulders   TODAY'S TREATMENT:  11/22/23 THERAPEUTIC EXERCISE: To improve strength.  Demonstration, verbal and tactile cues throughout for technique. NuStep L6 x 8'  Sidestepping blue TB x 5 laps at counter Back to counter heel raises x 20 BLE Back to counter toe raises x 20 BLE Lumbar extension at counter x 20 Standing at counter hip flexor stretch x 1' x 2 BLE Seated FABER piriformis stretch x 1' x 2 BLE Seated hamstring stretch w/ leg propped up on mat table x 1' x 2 RLE Seated pigeon stretch x 1' BLE  NEUROMUSCULAR RE-EDUCATION: To improve balance. Diagonal PVC cross step overs x 10 each direction  PATIENT  EDUCATION:  Education details: HEP review  Person educated: Patient Education method: Explanation, Demonstration, Verbal cues, Tactile cues, and Handouts Education comprehension: verbalized understanding, verbal cues required, tactile cues required, and needs further education  HOME EXERCISE PROGRAM: Access Code: [email or web address] URL: [email or web address] Date: 10/03/2023 Prepared by: Garnette Montclair  Exercises - Seated Hamstring Stretch  - 1 x daily - 7 x weekly - 1 sets - 1 reps - 1 min hold - Standing Lumbar Extension with Counter  - 1 x daily - 7 x weekly - 1 sets - 10 reps - Supine Lower Trunk Rotation  - 1 x daily - 7 x weekly - 1 sets - 10 reps - Seated Figure 4 Piriformis Stretch  - 1 x daily - 7 x weekly - 1 sets - 1 reps - 1 min hold - Seated Piriformis Stretch  - 1 x daily - 7 x weekly - 1 sets - 1 reps - 1 min hold - Supine Bridge  - 1 x daily - 7 x weekly - 3 sets - 10 reps   ASSESSMENT:  CLINICAL IMPRESSION: Patient has been seen x 3 months PT initially for unsteady gait/falls.   TUG has improved to 12.53 sec, gait speed has improved to 2.77 sec, and  BERG balance scale has improved to 46/56.   He is still a fall risk, but he his risk has decreased from his initial visit with us .   His spouse is advised that given his dementia that he may need a rollator walker in the future.   Discussed this with patient and spouse and patient is resistant to the idea.   However, I have told him that if he has anymore falls, then he needs to obtain and use a rollator when he is out of the home.  The patient was also treated for for LBP during this episode of care and he has made improvements with this as well.   He did have a flare up a month or so ago, but pain has been well managed since that time.  His biggest c/o is R sided LBP along the iliac crest and R paraspinals.   IT is not severe and mainly bothers him in the AM hours.   He continues to have significant tightness in his R  hip and is advised to continue stretching this daily.   He has also been advised by PCP office when he had the flare up to take Tylenol  more consistently throughout the day.    Delio also had a referral for BPPV during this episode of care.  He was seen x 1 visit for the vertigo symptoms and has had no further issues with it since that time.   Hopefully he will continue to remain symptom free with respect to  dizziness.   Overall the patient is at his maximum rehab potential at this time regarding his gait, balance, and LBP.     He and spouse are advised to continue with his home exercises daily as tolerated and call us  with any further questions.  D/C PT   OBJECTIVE IMPAIRMENTS: Abnormal gait, decreased balance, difficulty walking, decreased ROM, decreased strength, decreased safety awareness, impaired perceived functional ability, impaired flexibility, improper body mechanics, postural dysfunction, and pain.   ACTIVITY LIMITATIONS: carrying, lifting, bending, stairs, and locomotion level  PARTICIPATION LIMITATIONS: community activity  PERSONAL FACTORS: Age and 1-2 comorbidities:   dementia, blood clotting disorder (Factor V), h/o DVT and PE, ataxia, LBP, moderate aortic sclerosis are also affecting patient's functional outcome.   REHAB POTENTIAL: Good  CLINICAL DECISION MAKING: Evolving/moderate complexity  EVALUATION COMPLEXITY: Moderate   GOALS: Goals reviewed with patient? Yes  SHORT TERM GOALS: Target date: 11/16/2023   Patient will be independent with initial HEP to improve outcomes and carryover.  Baseline: 100% PT assist required for correct completion 9/8:  Needs assist  10/29/23:  patient is able to return demo his basic stretches today Goal status: MET  2.  Patient will report 25% improvement in low back pain to improve QOL. Baseline: variable pain day to day with 7/10 worst 10/14/23:  tells me worst pain over weekend was 5/10 10/29/23:  states 0/10 today; 3/10 this am Goal  status: MET  LONG TERM GOALS: Target date: 12/14/2023   Patient will be independent with ongoing/advanced HEP for self-management at home.  Baseline: no advanced lumbar HEP yet 10/14/23:  progressing with more advanced exercises 11/20/23:  has a good HEP to continue at home, but will require constant assistance with completion due to dementia Goal status: MET  2.  Patient will rep/ort 50-75% improvement in low back pain to improve QOL. / Baseline: 7/10 worst:  10/29/23:  2-3/10 worst  11/20/23:  1/10 Goal status: MET  3.  Patient to demonstrate ability to achieve and maintain good spinal alignment/posturing and body mechanics needed for daily activities. Baseline: flat back, flexed hips in standing with forward head 11/14/23:  some lordosis is present now, but very minimal;  He can hold his head upright for longer with ambulation than initially 11/20/23:  with cueing he can stand with mostly normal posture but does revert back to habitual flexion quickly Goal status: MET  4.  Patient will demonstrate increased lumbar ROM  by 10-20% in all planes to perform ADLs.   Baseline: Refer to above lumbar ROM table 11/14/23:  see updates to tables 11/20/23:  see tables above Goal status: MET  5.  Patient will demonstrate improved BLE strength to >/= 4+/5 for improved stability and ease of mobility. Baseline: Refer to above LE MMT table 10/29/23:  prone hip extension today = 4- RLE 11/14/23:  see updates to tables 11/20/23:  see tables Goal status: MET  7.  Patient will tolerate 20-30 min of (standing/sitting/walking) w/o increased pain to allow for  improved mobility and activity tolerance and be able to go shopping with his spouse Baseline: able to access community with spouse supervision/assistance but in pain 11/14/23:  spouse reports patient is walking a little better with less pain especially after his AM tylenol  10/17:  spouse reports patient can go shopping with her and push the cart Goal  status: MET  8.  Patient will improve Berg balance score to >/= 48/56 to allow for gait with a cane rather than needing a walker for  community access  Baseline: 10/16/23:  40/56 indicating patient needs a walker all times  11/11/23:  46/56  Goal status: NOT FULLY MET  9.  Patient will improve 5xSTS time to </= 12 seconds for improved efficiency and safety with transfers. Baseline: 10/16/23:  17.51 sec 11/11/23:  13.26 sec 11/14/23: 13.1 sec Goal status: NOT FULLY MET   10.  Patient will demonstrate gait speed of >/= 1.8 ft/sec (0.55 m/s) to be a safe limited community ambulator with decreased risk for recurrent falls.  Baseline: 10/16/23:  tug = 14.38 sec 11/11/23:  2.77 FT/SEC gait speed   TUG = 12.53 sec Goal status:MET  PLAN:  PT FREQUENCY: 1-2x/week  PT DURATION: 6 weeks  PLANNED INTERVENTIONS: 97164- PT Re-evaluation, 97750- Physical Performance Testing, 97110-Therapeutic exercises, 97530- Therapeutic activity, 97112- Neuromuscular re-education, 97535- Self Care, [id number]- Manual therapy, [id number]- Gait training, [phone]- Electrical stimulation (unattended), 97016- Vasopneumatic device, [id number]- Ultrasound, 02987- Traction (mechanical), [id number]- Ionotophoresis 4mg /ml Dexamethasone , Taping, Joint mobilization, Spinal mobilization, Cryotherapy, and Moist heat  PLAN FOR NEXT SESSION:  DC PT  PHYSICAL THERAPY DISCHARGE SUMMARY  Visits from Start of Care: 22  Current functional level related to goals / functional outcomes: SEE ABOVE NOTE IN ASSESSMENT  SECTION   Remaining deficits: Residual balance and gait deficits; intermittent R sided LBP   Education / Equipment: Patient is independent with all home exercises and advised to continue daily as tolerated and call us  with any questions    Patient agrees to discharge. Patient goals were partially met. Patient is being discharged due to maximized rehab potential.    Sausha Raymond, PT 11/22/2023, 12:14 PM

## 2023-12-02 ENCOUNTER — Telehealth: Payer: Self-pay | Admitting: Physician Assistant

## 2023-12-02 ENCOUNTER — Other Ambulatory Visit: Payer: Self-pay | Admitting: Physician Assistant

## 2023-12-02 MED ORDER — DONEPEZIL HCL 23 MG PO TABS: 23.0000 mg | ORAL_TABLET | Freq: Every day | ORAL | 3 refills | Status: AC

## 2023-12-02 NOTE — Telephone Encounter (Signed)
 Pt wife called and states that patient is doing well on the increase of dosage on the Donepezil   and would like to have a refill called in for the 20 mg. He would like it called into the [facility] on [address]

## 2023-12-06 ENCOUNTER — Institutional Professional Consult (permissible substitution): Payer: Self-pay | Admitting: Psychology

## 2023-12-31 ENCOUNTER — Ambulatory Visit: Payer: Medicare HMO

## 2023-12-31 VITALS — Ht 67.0 in | Wt 171.0 lb

## 2023-12-31 DIAGNOSIS — Z0000 Encounter for general adult medical examination without abnormal findings: Secondary | ICD-10-CM

## 2023-12-31 NOTE — Progress Notes (Signed)
 Chief Complaint  Patient presents with   Medicare Wellness     Subjective:   Shawn [name] is a 89 y.o. male who presents for a Medicare Annual Wellness Visit.  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   Generalized anxiety disorder 10/30/2021   Hemorrhoids    Hepatic cyst 11/19/2007   High serum vitamin D  01/12/2020   History of blood clots    History of colon polyps 11/02/2011   hyperplastic   Hyperlipidemia    Impacted cerumen 07/29/2009   Major depressive disorder 10/30/2021   Osteoarthritis    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   Otitis externa 07/07/2021   Overweight 10/05/2010   Pain of left thumb 02/20/2016   Pulmonary embolism (HCC) 11/20/2009   Right groin pain 11/16/2021   Thrombocytopenia 12/01/2009   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   Past Surgical History:  Procedure Laterality Date   APPENDECTOMY     CATARACT EXTRACTION  02/09/2007   right eye   INGUINAL HERNIA REPAIR     INGUINAL HERNIA REPAIR Right 01/05/2022   Procedure: RIGHT HERNIA REPAIR RECURRENT INGUINAL WITH THREE BY SIX POLYPROPYLENE MESH;  Surgeon: Sebastian Moles, MD;  Location: [facility] OR;  Service: General;  Laterality: Right;  GEN AND TAP BLOCK   TONSILLECTOMY     VARICOSE VEIN SURGERY     VASECTOMY     Family History  Problem Relation Age of Onset   Lung cancer Father    Heart attack Mother 47   Heart  disease Mother        chf   Arthritis Mother    Colon cancer Neg Hx    Esophageal cancer Neg Hx    Rectal cancer Neg Hx    Stomach cancer Neg Hx    Social History   Occupational History   Occupation: Retired    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  Tobacco Use   Smoking status: Never   Smokeless tobacco: Never  Vaping Use   Vaping status: Never Used  Substance and Sexual Activity   Alcohol use: No    Alcohol/week: 0.0 standard drinks of alcohol   Drug use: No   Sexual activity: Yes    Partners: Female   Tobacco Counseling Counseling given: No  SDOH Screenings   Food Insecurity: No Food Insecurity (12/31/2023)  Housing: Unknown (12/31/2023)  Transportation Needs: No Transportation Needs (12/31/2023)  Utilities: Not At Risk (12/31/2023)  Alcohol Screen: Low Risk  (12/25/2022)  Depression (PHQ2-9): Low Risk  (12/31/2023)  Financial Resource Strain: Low Risk  (12/25/2022)  Physical Activity: Insufficiently Active (12/31/2023)  Social Connections: Moderately Integrated (12/31/2023)  Stress: No Stress Concern Present (12/31/2023)  Tobacco Use: Low Risk  (12/31/2023)  Health Literacy: Adequate Health Literacy (12/31/2023)   See flowsheets for full screening details  Depression Screen PHQ 2 & 9 Depression Scale- Over the past 2 weeks, how often have you been bothered by any of the  following problems? Little interest or pleasure in doing things: 0 Feeling down, depressed, or hopeless (PHQ Adolescent also includes...irritable): 0 PHQ-2 Total Score: 0 Trouble falling or staying asleep, or sleeping too much: 0 Feeling tired or having little energy: 0 Poor appetite or overeating (PHQ Adolescent also includes...weight loss): 0 Feeling bad about yourself - or that you are a failure or have let yourself or your family down: 0 Trouble concentrating on things, such as reading the newspaper or watching television (PHQ Adolescent also includes...like school work): 0 Moving or speaking so slowly that  other people could have noticed. Or the opposite - being so fidgety or restless that you have been moving around a lot more than usual: 0 Thoughts that you would be better off dead, or of hurting yourself in some way: 0 PHQ-9 Total Score: 0 If you checked off any problems, how difficult have these problems made it for you to do your work, take care of things at home, or get along with other people?: Not difficult at all     Goals Addressed               This Visit's Progress     Remain active (pt-stated)         Visit info / Clinical Intake: Medicare Wellness Visit Type:: Subsequent Annual Wellness Visit Persons participating in visit:: patient Medicare Wellness Visit Mode:: Telephone If telephone:: video declined Because this visit was a virtual/telehealth visit:: pt reported vitals If Telephone or Video please confirm:: I connected with the patient using audio enabled telemedicine application and verified that I am speaking with the correct person using two identifiers Patient Location:: Home Provider Location:: Office Information given by:: patient Interpreter Needed?: No Pre-visit prep was completed: no AWV questionnaire completed by patient prior to visit?: no Living arrangements:: lives with spouse/significant other Patient's Overall Health Status Rating: (!) fair Typical amount of pain: none Does pain affect daily life?: no Are you currently prescribed opioids?: no  Dietary Habits and Nutritional Risks How many meals a day?: 3 Eats fruit and vegetables daily?: yes Most meals are obtained by: preparing own meals In the last 2 weeks, have you had any of the following?: none Diabetic:: no  Functional Status Activities of Daily Living (to include ambulation/medication): Independent Ambulation: Independent with device- listed below Home Assistive Devices/Equipment: Eyeglasses; Dentures (specify type) Medication Administration: Independent Home Management:  Independent Manage your own finances?: yes Primary transportation is: family/friends (Wife) Concerns about vision?: no *vision screening is required for WTM* Concerns about hearing?: no  Fall Screening Falls in the past year?: 1 Number of falls in past year: 1 Was there an injury with Fall?: 0 Fall Risk Category Calculator: 2 Patient Fall Risk Level: Moderate Fall Risk  Fall Risk Patient at Risk for Falls Due to: Impaired balance/gait Fall risk Follow up: Falls evaluation completed; Falls prevention discussed  Home and Transportation Safety: All rugs have non-skid backing?: yes All stairs or steps have railings?: N/A, no stairs Grab bars in the bathtub or shower?: yes Have non-skid surface in bathtub or shower?: (!) no Good home lighting?: yes Regular seat belt use?: yes Hospital stays in the last year:: no  Cognitive Assessment Difficulty concentrating, remembering, or making decisions? : no Will 6CIT or Mini Cog be Completed: yes What year is it?: 4 points What month is it?: 0 points Give patient an address phrase to remember (5 components): -- (Patient declined) About what time is it?: 3 points Count backwards from 20 to  1: 2 points Say the months of the year in reverse: -- (Patient declined) Repeat the address phrase from earlier: -- (Patient declined)  Advance Directives (For Healthcare) Does Patient Have a Medical Advance Directive?: Yes Does patient want to make changes to medical advance directive?: No - Patient declined Type of Advance Directive: Healthcare Power of [place]; Living will Copy of Healthcare Power of Attorney in Chart?: No - copy requested Copy of Living Will in Chart?: No - copy requested  Reviewed/Updated  Reviewed/Updated: Reviewed All (Medical, Surgical, Family, Medications, Allergies, Care Teams, Patient Goals)        Objective:    Today's Vitals   12/31/23 1021  Weight: 171 lb (77.6 kg)  Height: 5' 7 (1.702 m)   Body mass index  is 26.78 kg/m.  Current Medications (verified) Outpatient Encounter Medications as of 12/31/2023  Medication Sig   ascorbic acid (VITAMIN C) 500 MG tablet Take 500 mg by mouth daily with lunch.   Cholecalciferol 125 MCG (5000 UT) TABS Take 5,000 Units by mouth every Monday, Tuesday, Wednesday, Thursday, and Friday.  With lunch   cyanocobalamin  (VITAMIN B12) 1000 MCG tablet Take 1,000 mcg by mouth daily.   donepezil  (ARICEPT ) 23 MG TABS tablet Take 1 tablet (23 mg total) by mouth daily.   ELIQUIS  5 MG TABS tablet TAKE 1 TABLET BY MOUTH TWICE A DAY   ezetimibe  (ZETIA ) 10 MG tablet Take 1 tablet (10 mg total) by mouth daily.   gabapentin  (NEURONTIN ) 100 MG capsule Take 1-2 capsules (100-200 mg total) by mouth at bedtime as needed.   memantine  (NAMENDA ) 10 MG tablet Take 1 tablet twice a day   Multiple Vitamin (MULTIVITAMIN WITH MINERALS) TABS tablet Take 1 tablet by mouth daily with lunch.   pantoprazole  (PROTONIX ) 40 MG tablet Take 1 tablet (40 mg total) by mouth daily.   sertraline  (ZOLOFT ) 50 MG tablet Take 1 tablet (50 mg total) by mouth daily.   No facility-administered encounter medications on file as of 12/31/2023.   Hearing/Vision screen Hearing Screening - Comments:: Denies hearing difficulties   Vision Screening - Comments:: Wears rx glasses - up to date with routine eye exams with  [facility] Immunizations and Health Maintenance Health Maintenance  Topic Date Due   COVID-19 Vaccine (4 - 2025-26 season) 10/07/2023   Medicare Annual Wellness (AWV)  12/30/2024   DTaP/Tdap/Td (5 - Td or Tdap) 03/22/2032   Pneumococcal Vaccine: 50+ Years  Completed   Influenza Vaccine  Completed   Zoster Vaccines- Shingrix  Completed   Meningococcal B Vaccine  Aged Out        Assessment/Plan:  This is a routine wellness examination for Shawn [name].  Patient Care Team: [name], [name], DO as PCP - General Lavona Agent, MD as PCP - Cardiology (Cardiology) Lavona Agent, MD as  Consulting Physician (Cardiology) Marcey Elspeth PARAS, MD as Consulting Physician (Ophthalmology)  I have personally reviewed and noted the following in the patient's chart:   Medical and social history Use of alcohol, tobacco or illicit drugs  Current medications and supplements including opioid prescriptions. Functional ability and status Nutritional status Physical activity Advanced directives List of other physicians Hospitalizations, surgeries, and ER visits in previous 12 months Vitals Screenings to include cognitive, depression, and falls Referrals and appointments  No orders of the defined types were placed in this encounter.  In addition, I have reviewed and discussed with patient certain preventive protocols, quality metrics, and best practice recommendations. A written personalized care plan for preventive  services as well as general preventive health recommendations were provided to patient.   Rojelio LELON Blush, LPN   [id number]   Return in 1 year on 01/05/25  After Visit Summary: (MyChart) Due to this being a telephonic visit, the after visit summary with patients personalized plan was offered to patient via MyChart   Nurse Notes: None

## 2023-12-31 NOTE — Patient Instructions (Addendum)
 [name],  Thank you for taking the time for your Medicare Wellness Visit. I appreciate your continued commitment to your health goals. Please review the care plan we discussed, and feel free to reach out if I can assist you further.  Please note that Annual Wellness Visits do not include a physical exam. Some assessments may be limited, especially if the visit was conducted virtually. If needed, we may recommend an in-person follow-up with your provider.  Ongoing Care Seeing your primary care provider every 3 to 6 months helps us  monitor your health and provide consistent, personalized care.    Referrals If a referral was made during today's visit and you haven't received any updates within two weeks, please contact the referred provider directly to check on the status.  Recommended Screenings:  Health Maintenance  Topic Date Due   COVID-19 Vaccine (4 - 2025-26 season) 10/07/2023   Medicare Annual Wellness Visit  12/30/2024   DTaP/Tdap/Td vaccine (5 - Td or Tdap) 03/22/2032   Pneumococcal Vaccine for age over 38  Completed   Flu Shot  Completed   Zoster (Shingles) Vaccine  Completed   Meningitis B Vaccine  Aged Out       12/31/2023   10:25 AM  Advanced Directives  Does Patient Have a Medical Advance Directive? Yes  Type of Estate Agent of [place];Living will  Does patient want to make changes to medical advance directive? No - Patient declined  Copy of Healthcare Power of Attorney in Chart? No - copy requested    Vision: Annual vision screenings are recommended for early detection of glaucoma, cataracts, and diabetic retinopathy. These exams can also reveal signs of chronic conditions such as diabetes and high blood pressure.  Dental: Annual dental screenings help detect early signs of oral cancer, gum disease, and other conditions linked to overall health, including heart disease and diabetes.  Please see the attached documents for  additional preventive care recommendations.

## 2024-01-03 ENCOUNTER — Other Ambulatory Visit: Payer: Self-pay | Admitting: Physician Assistant

## 2024-01-03 ENCOUNTER — Other Ambulatory Visit: Payer: Self-pay | Admitting: Family Medicine

## 2024-01-03 DIAGNOSIS — F418 Other specified anxiety disorders: Secondary | ICD-10-CM

## 2024-01-03 DIAGNOSIS — E785 Hyperlipidemia, unspecified: Secondary | ICD-10-CM

## 2024-01-20 ENCOUNTER — Encounter: Payer: Self-pay | Admitting: Family Medicine

## 2024-01-20 ENCOUNTER — Ambulatory Visit: Payer: Medicare HMO | Admitting: Family Medicine

## 2024-01-20 VITALS — BP 130/80 | HR 61 | Temp 97.8°F | Resp 16 | Ht 67.0 in | Wt 177.8 lb

## 2024-01-20 DIAGNOSIS — F0280 Dementia in other diseases classified elsewhere without behavioral disturbance: Secondary | ICD-10-CM

## 2024-01-20 DIAGNOSIS — Z86718 Personal history of other venous thrombosis and embolism: Secondary | ICD-10-CM | POA: Diagnosis not present

## 2024-01-20 DIAGNOSIS — Z0000 Encounter for general adult medical examination without abnormal findings: Secondary | ICD-10-CM

## 2024-01-20 DIAGNOSIS — H04203 Unspecified epiphora, bilateral lacrimal glands: Secondary | ICD-10-CM | POA: Diagnosis not present

## 2024-01-20 DIAGNOSIS — E559 Vitamin D deficiency, unspecified: Secondary | ICD-10-CM

## 2024-01-20 DIAGNOSIS — R739 Hyperglycemia, unspecified: Secondary | ICD-10-CM

## 2024-01-20 DIAGNOSIS — R110 Nausea: Secondary | ICD-10-CM | POA: Diagnosis not present

## 2024-01-20 DIAGNOSIS — G309 Alzheimer's disease, unspecified: Secondary | ICD-10-CM | POA: Diagnosis not present

## 2024-01-20 DIAGNOSIS — F418 Other specified anxiety disorders: Secondary | ICD-10-CM

## 2024-01-20 DIAGNOSIS — E785 Hyperlipidemia, unspecified: Secondary | ICD-10-CM | POA: Diagnosis not present

## 2024-01-20 DIAGNOSIS — R351 Nocturia: Secondary | ICD-10-CM | POA: Diagnosis not present

## 2024-01-20 LAB — TSH: TSH: 1.71 u[IU]/mL (ref 0.35–5.50)

## 2024-01-20 LAB — VITAMIN B12: Vitamin B-12: 1076 pg/mL — ABNORMAL HIGH (ref 211–911)

## 2024-01-20 LAB — CBC WITH DIFFERENTIAL/PLATELET
Basophils Absolute: 0 K/uL (ref 0.0–0.1)
Basophils Relative: 0.4 % (ref 0.0–3.0)
Eosinophils Absolute: 0.1 K/uL (ref 0.0–0.7)
Eosinophils Relative: 2.7 % (ref 0.0–5.0)
HCT: 48.1 % (ref 39.0–52.0)
Hemoglobin: 16.9 g/dL (ref 13.0–17.0)
Lymphocytes Relative: 31.2 % (ref 12.0–46.0)
Lymphs Abs: 1.3 K/uL (ref 0.7–4.0)
MCHC: 35.1 g/dL (ref 30.0–36.0)
MCV: 100.7 fl — ABNORMAL HIGH (ref 78.0–100.0)
Monocytes Absolute: 0.4 K/uL (ref 0.1–1.0)
Monocytes Relative: 10.3 % (ref 3.0–12.0)
Neutro Abs: 2.4 K/uL (ref 1.4–7.7)
Neutrophils Relative %: 55.4 % (ref 43.0–77.0)
Platelets: 140 K/uL — ABNORMAL LOW (ref 150.0–400.0)
RBC: 4.77 Mil/uL (ref 4.22–5.81)
RDW: 12.8 % (ref 11.5–15.5)
WBC: 4.3 K/uL (ref 4.0–10.5)

## 2024-01-20 LAB — LIPID PANEL
Cholesterol: 154 mg/dL (ref 0–200)
HDL: 40 mg/dL (ref >39.00)
LDL Cholesterol: 78 mg/dL (ref 0–99)
NonHDL: 113.76
Total CHOL/HDL Ratio: 4
Triglycerides: 179 mg/dL — ABNORMAL HIGH (ref 0.0–149.0)
VLDL: 35.8 mg/dL (ref 0.0–40.0)

## 2024-01-20 LAB — COMPREHENSIVE METABOLIC PANEL WITH GFR
ALT: 23 U/L (ref 0–53)
AST: 23 U/L (ref 0–37)
Albumin: 4.6 g/dL (ref 3.5–5.2)
Alkaline Phosphatase: 65 U/L (ref 39–117)
BUN: 15 mg/dL (ref 6–23)
CO2: 36 meq/L — ABNORMAL HIGH (ref 19–32)
Calcium: 9.9 mg/dL (ref 8.4–10.5)
Chloride: 102 meq/L (ref 96–112)
Creatinine, Ser: 1.03 mg/dL (ref 0.40–1.50)
GFR: 65.55 mL/min (ref >60.00)
Glucose, Bld: 92 mg/dL (ref 70–99)
Potassium: 4.4 meq/L (ref 3.5–5.1)
Sodium: 143 meq/L (ref 135–145)
Total Bilirubin: 1.4 mg/dL — ABNORMAL HIGH (ref 0.2–1.2)
Total Protein: 7.2 g/dL (ref 6.0–8.3)

## 2024-01-20 LAB — VITAMIN D 25 HYDROXY (VIT D DEFICIENCY, FRACTURES): VITD: 70.45 ng/mL (ref 30.00–100.00)

## 2024-01-20 LAB — PSA: PSA: 3.73 ng/mL (ref 0.10–4.00)

## 2024-01-20 MED ORDER — PANTOPRAZOLE SODIUM 40 MG PO TBEC: 40.0000 mg | DELAYED_RELEASE_TABLET | Freq: Every day | ORAL | 3 refills | Status: AC

## 2024-01-20 MED ORDER — APIXABAN 5 MG PO TABS: 5.0000 mg | ORAL_TABLET | Freq: Two times a day (BID) | ORAL | 1 refills | Status: AC

## 2024-01-20 MED ORDER — SERTRALINE HCL 50 MG PO TABS: 50.0000 mg | ORAL_TABLET | Freq: Every day | ORAL | 3 refills | Status: AC

## 2024-01-20 MED ORDER — EZETIMIBE 10 MG PO TABS: 10.0000 mg | ORAL_TABLET | Freq: Every day | ORAL | 3 refills | Status: AC

## 2024-01-20 NOTE — Progress Notes (Signed)
 Subjective:    Patient ID: Shawn [name], male    DOB: October 06, 1934, 89 y.o.   MRN: [id number]  Chief Complaint  Patient presents with   Annual Exam    Pt states fasting     HPI Patient is in today for cpe.  Discussed the use of AI scribe software for clinical note transcription with the patient, who gave verbal consent to proceed.  History of Present Illness Shawn [name] is an 89 year old male with Alzheimer's disease who presents with memory issues and frequent tearing of the eye. He is accompanied by his wife.  He has ongoing memory issues, often forgetting whether he has watched certain TV shows. This is a recent development, and he is trying to compensate for it. He is currently taking Aricept  and Namenda  (memantine ) for Alzheimer's disease. His wife assists in managing his medications and appointments.  He experiences frequent tearing from his eye, which he attributes to a blocked tear duct. An eye doctor advised against a procedure to open the duct due to potential risks. Despite this, he finds the constant tearing bothersome.  He reports a sensation of leaning forward while walking, which has led to a couple of falls. He describes feeling like he is falling forward, although he has not sustained any significant injuries from these incidents.  He is currently on several medications including Eliquis , Protonix , ezetimibe  for cholesterol, and sertraline . He engages in activities such as jigsaw puzzles to keep his mind active, although he does not enjoy reading or number-based puzzles like Sudoku. He used to do crosswords and word finds but has not done them recently. Hearing is selective.  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   Generalized anxiety disorder 10/30/2021   Hemorrhoids    Hepatic cyst 11/19/2007   High serum vitamin D  01/12/2020   History of blood clots    History of colon polyps 11/02/2011   hyperplastic   Hyperlipidemia    Impacted cerumen 07/29/2009   Major depressive disorder 10/30/2021   Osteoarthritis    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   Otitis externa 07/07/2021   Overweight 10/05/2010   Pain of left thumb 02/20/2016   Pulmonary embolism (HCC) 11/20/2009   Right groin pain 11/16/2021   Thrombocytopenia 12/01/2009   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    Past Surgical History:  Procedure Laterality Date   APPENDECTOMY     CATARACT EXTRACTION  02/09/2007   right eye   INGUINAL HERNIA REPAIR     INGUINAL HERNIA REPAIR Right 01/05/2022   Procedure: RIGHT HERNIA REPAIR RECURRENT INGUINAL WITH THREE BY SIX POLYPROPYLENE MESH;  Surgeon: Sebastian Moles, MD;  Location: [facility] OR;  Service: General;  Laterality: Right;  GEN AND TAP BLOCK   TONSILLECTOMY     VARICOSE VEIN SURGERY     VASECTOMY      Family History  Problem Relation Age of Onset   Lung cancer Father  Heart attack Mother 61   Heart disease Mother        chf   Arthritis Mother    Colon cancer Neg Hx    Esophageal cancer Neg Hx    Rectal cancer Neg Hx    Stomach cancer Neg Hx     Social History   Socioeconomic History   Marital status: Married    Spouse name: Not on file   Number of children: 4   Years of education: 12   Highest education level: High school graduate  Occupational History   Occupation: Retired    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    Right handed   One story home   Social Drivers of Health   Tobacco Use: Low Risk (01/20/2024)   Patient History    Smoking Tobacco Use: Never    Smokeless Tobacco Use: Never    Passive Exposure: Not on file  Financial Resource Strain: Low Risk (12/25/2022)   Overall Financial Resource Strain (CARDIA)    Difficulty of Paying Living Expenses: Not hard at all  Food Insecurity: No Food Insecurity (12/31/2023)   Epic    Worried About Programme Researcher, Broadcasting/film/video in the Last Year: Never true    Ran Out of Food in the Last Year: Never true  Transportation Needs: No Transportation Needs (12/31/2023)   Epic    Lack of Transportation (Medical): No    Lack of Transportation (Non-Medical): No  Physical Activity: Insufficiently Active (12/31/2023)   Exercise Vital Sign    Days of Exercise per Week: 7 days    Minutes of Exercise per Session: 20 min  Stress: No Stress Concern Present (12/31/2023)   [employer] of Occupational Health - Occupational Stress Questionnaire    Feeling of Stress: Not at all  Social Connections: Moderately Integrated (12/31/2023)   Social Connection and Isolation Panel    Frequency of Communication with Friends and Family: More than three times a week    Frequency of Social Gatherings with Friends and Family: More than three times a week    Attends Religious Services: More than 4 times per year    Active Member of [employer] or Organizations: No    Attends Banker Meetings: Never    Marital Status: Married  Catering Manager Violence: Not At Risk (12/31/2023)   Epic    Fear  of Current or Ex-Partner: No    Emotionally Abused: No    Physically Abused: No    Sexually Abused: No  Depression (PHQ2-9): Low Risk (12/31/2023)   Depression (PHQ2-9)    PHQ-2 Score: 0  Alcohol Screen: Low Risk (12/25/2022)   Alcohol Screen    Last Alcohol Screening Score (AUDIT): 0  Housing: Unknown (12/31/2023)   Epic    Unable to Pay for Housing in the Last Year: No    Number of Times Moved in the  Last Year: Not on file    Homeless in the Last Year: No  Utilities: Not At Risk (12/31/2023)   Epic    Threatened with loss of utilities: No  Health Literacy: Adequate Health Literacy (12/31/2023)   B1300 Health Literacy    Frequency of need for help with medical instructions: Never    Outpatient Medications Prior to Visit  Medication Sig Dispense Refill   ascorbic acid (VITAMIN C) 500 MG tablet Take 500 mg by mouth daily with lunch.     Cholecalciferol 125 MCG (5000 UT) TABS Take 5,000 Units by mouth every Monday, Tuesday, Wednesday, Thursday, and Friday.  With lunch     cyanocobalamin  (VITAMIN B12) 1000 MCG tablet Take 1,000 mcg by mouth daily.     donepezil  (ARICEPT ) 23 MG TABS tablet Take 1 tablet (23 mg total) by mouth daily. 90 tablet 3   memantine  (NAMENDA ) 10 MG tablet Take 1 tablet twice a day 180 tablet 3   Multiple Vitamin (MULTIVITAMIN WITH MINERALS) TABS tablet Take 1 tablet by mouth daily with lunch.     ELIQUIS  5 MG TABS tablet TAKE 1 TABLET BY MOUTH TWICE A DAY 180 tablet 1   ezetimibe  (ZETIA ) 10 MG tablet TAKE 1 TABLET BY MOUTH EVERY DAY 90 tablet 3   gabapentin  (NEURONTIN ) 100 MG capsule Take 1-2 capsules (100-200 mg total) by mouth at bedtime as needed. 20 capsule 0   pantoprazole  (PROTONIX ) 40 MG tablet Take 1 tablet (40 mg total) by mouth daily. 90 tablet 1   sertraline  (ZOLOFT ) 50 MG tablet TAKE 1 TABLET BY MOUTH EVERY DAY 90 tablet 3   No facility-administered medications prior to visit.    Allergies[1]  Review of Systems  Constitutional:  Negative for  fever and malaise/fatigue.  HENT:  Negative for congestion.   Eyes:  Negative for blurred vision.  Respiratory:  Negative for shortness of breath.   Cardiovascular:  Negative for chest pain, palpitations and leg swelling.  Gastrointestinal:  Negative for abdominal pain, blood in stool and nausea.  Genitourinary:  Negative for dysuria and frequency.  Musculoskeletal:  Negative for falls.  Skin:  Negative for rash.  Neurological:  Negative for dizziness, loss of consciousness and headaches.  Endo/Heme/Allergies:  Negative for environmental allergies.  Psychiatric/Behavioral:  Positive for memory loss. Negative for depression. The patient is not nervous/anxious.        Objective:    Physical Exam Vitals and nursing note reviewed.  Constitutional:      General: He is not in acute distress.    Appearance: Normal appearance. He is well-developed.  HENT:     Head: Normocephalic and atraumatic.  Eyes:     General: No scleral icterus.       Right eye: No discharge.        Left eye: No discharge.  Cardiovascular:     Rate and Rhythm: Normal rate and regular rhythm.     Heart sounds: No murmur heard. Pulmonary:     Effort: Pulmonary effort is normal. No respiratory distress.     Breath sounds: Normal breath sounds.  Musculoskeletal:        General: Normal range of motion.     Cervical back: Normal range of motion and neck supple.     Right lower leg: No edema.     Left lower leg: No edema.  Skin:    General: Skin is warm and dry.  Neurological:     Mental Status: He is alert and oriented to person, place,  and time.  Psychiatric:        Mood and Affect: Mood normal.        Behavior: Behavior normal.        Thought Content: Thought content normal.        Cognition and Memory: Memory is impaired.        Judgment: Judgment normal.     BP 130/80 (BP Location: Right Arm, Patient Position: Sitting, Cuff Size: Large)   Pulse 61   Temp 97.8 F (36.6 C) (Oral)   Resp 16   Ht 5' 7  (1.702 m)   Wt 177 lb 12.8 oz (80.6 kg)   SpO2 94%   BMI 27.85 kg/m  Wt Readings from Last 3 Encounters:  01/20/24 177 lb 12.8 oz (80.6 kg)  12/31/23 171 lb (77.6 kg)  11/06/23 176 lb (79.8 kg)    Diabetic Foot Exam - Simple   No data filed    Lab Results  Component Value Date   WBC 4.9 10/14/2023   HGB 16.6 10/14/2023   HCT 47.6 10/14/2023   PLT 126.0 (L) 10/14/2023   GLUCOSE 125 (H) 10/14/2023   CHOL 163 01/17/2023   TRIG 149.0 01/17/2023   HDL 40.20 01/17/2023   LDLDIRECT 86.0 06/17/2014   LDLCALC 93 01/17/2023   ALT 27 10/14/2023   AST 23 10/14/2023   NA 141 10/14/2023   K 4.3 10/14/2023   CL 101 10/14/2023   CREATININE 1.05 10/14/2023   BUN 16 10/14/2023   CO2 34 (H) 10/14/2023   TSH 1.70 01/17/2023   PSA 3.03 09/26/2023   INR 1.1 08/09/2021   HGBA1C 5.5 10/17/2023    Lab Results  Component Value Date   TSH 1.70 01/17/2023   Lab Results  Component Value Date   WBC 4.9 10/14/2023   HGB 16.6 10/14/2023   HCT 47.6 10/14/2023   MCV 99.8 10/14/2023   PLT 126.0 (L) 10/14/2023   Lab Results  Component Value Date   NA 141 10/14/2023   K 4.3 10/14/2023   CO2 34 (H) 10/14/2023   GLUCOSE 125 (H) 10/14/2023   BUN 16 10/14/2023   CREATININE 1.05 10/14/2023   BILITOT 1.0 10/14/2023   ALKPHOS 56 10/14/2023   AST 23 10/14/2023   ALT 27 10/14/2023   PROT 6.9 10/14/2023   ALBUMIN 4.3 10/14/2023   CALCIUM  9.7 10/14/2023   ANIONGAP 5 12/21/2021   GFR 64.17 10/14/2023   Lab Results  Component Value Date   CHOL 163 01/17/2023   Lab Results  Component Value Date   HDL 40.20 01/17/2023   Lab Results  Component Value Date   LDLCALC 93 01/17/2023   Lab Results  Component Value Date   TRIG 149.0 01/17/2023   Lab Results  Component Value Date   CHOLHDL 4 01/17/2023   Lab Results  Component Value Date   HGBA1C 5.5 10/17/2023       Assessment & Plan:  Hyperglycemia -     CBC with Differential/Platelet -     Comprehensive metabolic panel with  GFR  Hyperlipidemia, unspecified hyperlipidemia type -     Ezetimibe ; Take 1 tablet (10 mg total) by mouth daily.  Dispense: 90 tablet; Refill: 3 -     Comprehensive metabolic panel with GFR -     Lipid panel  Nausea -     Pantoprazole  Sodium; Take 1 tablet (40 mg total) by mouth daily.  Dispense: 90 tablet; Refill: 3  Depression with anxiety -     Sertraline  HCl;  Take 1 tablet (50 mg total) by mouth daily.  Dispense: 90 tablet; Refill: 3  Vitamin D  deficiency -     VITAMIN D  25 Hydroxy (Vit-D Deficiency, Fractures)  Personal history of venous thrombosis and embolism -     Apixaban ; Take 1 tablet (5 mg total) by mouth 2 (two) times daily.  Dispense: 180 tablet; Refill: 1  Dementia due to Alzheimer disease (HCC) -     CBC with Differential/Platelet -     Comprehensive metabolic panel with GFR -     TSH -     VITAMIN D  25 Hydroxy (Vit-D Deficiency, Fractures) -     Vitamin B12  Nocturia -     PSA  Tearing eyes -     Ambulatory referral to Ophthalmology   Assessment and Plan Assessment & Plan Dementia due to Alzheimer disease   He experiences progressive memory decline with episodes of falling forward and tripping. Neurology follow-up with Dr. Georjean occurs twice a year. Current medications include Aricept  and Namenda . Continue Aricept  and Namenda  as prescribed. Encourage cognitive activities such as puzzles to slow cognitive decline.  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 Previous consultation with Dr. Marcey advised against intervention due to perceived risk. Discussed potential risks of surgical intervention and suggested seeking a second opinion. Referred to [facility] for further evaluation.  Depression with anxiety   Mood is variable with periods of low mood. Currently managed with sertraline . Continue sertraline  as prescribed.  Hyperlipidemia   Managed with ezetimibe . Continue ezetimibe  as prescribed.  Personal history of  venous thrombosis and embolism   Managed with Eliquis . Continue Eliquis  as prescribed.      [1]  Allergies Allergen Reactions   Crestor  [Rosuvastatin ] Other (See Comments)    disorientation   Niacin Rash    My whole body turned red on the prescription dose He takes the OTC medication   Pravastatin Other (See Comments)    Disorientation

## 2024-01-21 ENCOUNTER — Ambulatory Visit: Payer: Self-pay | Admitting: Family Medicine

## 2024-02-07 ENCOUNTER — Ambulatory Visit: Admitting: Physician Assistant

## 2024-03-11 ENCOUNTER — Telehealth: Payer: Self-pay | Admitting: Pharmacy Technician

## 2024-03-11 ENCOUNTER — Telehealth: Payer: Self-pay | Admitting: Physician Assistant

## 2024-03-11 ENCOUNTER — Other Ambulatory Visit (HOSPITAL_COMMUNITY): Payer: Self-pay

## 2024-03-11 NOTE — Telephone Encounter (Signed)
 Currently when I do test billing through [facility] it returns a copay of $0 for a 30 day supply. It does not say transitional fill or PA needed at this time. When I do try to submit a PA just to be sure it says PA is not needed.   PA has been submitted, and telephone encounter has been created. Please see telephone encounter dated 2.4.26.

## 2024-03-11 NOTE — Telephone Encounter (Signed)
 Pharmacy Patient Advocate Encounter  Received notification from [facility] that Prior Authorization for MEMANTINE  10MG  has been CANCELLED due to

## 2024-03-11 NOTE — Telephone Encounter (Signed)
 Pharmacy Patient Advocate Encounter   Received notification from Pt Calls Messages that prior authorization for MEMANTINE  10MG  is required/requested.   Insurance verification completed.   The patient is insured through [facility].   Per test claim: PA required; PA submitted to above mentioned insurance via Latent Key/confirmation #/EOC [id number] Status is pending

## 2024-03-11 NOTE — Telephone Encounter (Signed)
 Pt's wife Candis called this afternoon. Candis stated they receive a Letter from their insurance:  [employer] and it stated that they will not longer pay for the prescription called:  memantine  (NAMENDA ) 10 MG tablet  Candis stated that, [employer] told her that our office could send them a Exception  Letter to try to get approval for  that prescription. Thanks

## 2024-03-12 NOTE — Telephone Encounter (Signed)
 I advised no copayment. She thanked me for calling.They will check at the pharmacy.

## 2024-05-14 ENCOUNTER — Ambulatory Visit: Admitting: Physician Assistant

## 2024-07-20 ENCOUNTER — Ambulatory Visit: Admitting: Family Medicine

## 2025-01-21 ENCOUNTER — Encounter: Admitting: Family Medicine
